# Patient Record
Sex: Male | Born: 1955
Health system: Southern US, Community
[De-identification: ages and names within clinical notes are randomized; demographics above are authoritative.]

## PROBLEM LIST (undated history)

## (undated) ENCOUNTER — Emergency Department (HOSPITAL_COMMUNITY): Payer: Self-pay | Source: Home / Self Care

## (undated) DIAGNOSIS — N200 Calculus of kidney: Secondary | ICD-10-CM

## (undated) DIAGNOSIS — E785 Hyperlipidemia, unspecified: Secondary | ICD-10-CM

## (undated) DIAGNOSIS — E119 Type 2 diabetes mellitus without complications: Secondary | ICD-10-CM

## (undated) DIAGNOSIS — I10 Essential (primary) hypertension: Secondary | ICD-10-CM

## (undated) DIAGNOSIS — I639 Cerebral infarction, unspecified: Secondary | ICD-10-CM

## (undated) DIAGNOSIS — Z87442 Personal history of urinary calculi: Secondary | ICD-10-CM

## (undated) DIAGNOSIS — K259 Gastric ulcer, unspecified as acute or chronic, without hemorrhage or perforation: Secondary | ICD-10-CM

## (undated) DIAGNOSIS — I6529 Occlusion and stenosis of unspecified carotid artery: Secondary | ICD-10-CM

## (undated) HISTORY — DX: Gastric ulcer, unspecified as acute or chronic, without hemorrhage or perforation: K25.9

## (undated) HISTORY — DX: Calculus of kidney: N20.0

## (undated) HISTORY — DX: Essential (primary) hypertension: I10

## (undated) HISTORY — DX: Type 2 diabetes mellitus without complications: E11.9

## (undated) HISTORY — DX: Occlusion and stenosis of unspecified carotid artery: I65.29

## (undated) HISTORY — PX: OTHER SURGICAL HISTORY: SHX169

## (undated) HISTORY — DX: Cerebral infarction, unspecified: I63.9

## (undated) HISTORY — DX: Hyperlipidemia, unspecified: E78.5

## (undated) HISTORY — PX: HERNIA REPAIR: SHX51

---

## 2015-03-10 ENCOUNTER — Ambulatory Visit (INDEPENDENT_AMBULATORY_CARE_PROVIDER_SITE_OTHER): Payer: No Typology Code available for payment source | Admitting: Endocrinology

## 2015-03-10 ENCOUNTER — Encounter: Payer: Self-pay | Admitting: Endocrinology

## 2015-03-10 VITALS — BP 142/84 | HR 89 | Temp 98.3°F | Ht 70.0 in | Wt 203.0 lb

## 2015-03-10 DIAGNOSIS — I10 Essential (primary) hypertension: Secondary | ICD-10-CM

## 2015-03-10 DIAGNOSIS — E785 Hyperlipidemia, unspecified: Secondary | ICD-10-CM | POA: Diagnosis not present

## 2015-03-10 DIAGNOSIS — E119 Type 2 diabetes mellitus without complications: Secondary | ICD-10-CM | POA: Diagnosis not present

## 2015-03-10 LAB — POCT GLYCOSYLATED HEMOGLOBIN (HGB A1C): HEMOGLOBIN A1C: 8.7

## 2015-03-10 MED ORDER — GLIMEPIRIDE 1 MG PO TABS: 1.0000 mg | ORAL_TABLET | Freq: Every day | ORAL | Status: DC

## 2015-03-10 MED ORDER — CANAGLIFLOZIN 300 MG PO TABS: 300.0000 mg | ORAL_TABLET | Freq: Every day | ORAL | Status: DC

## 2015-03-10 NOTE — Patient Instructions (Addendum)
good diet and exercise significantly improve the control of your diabetes.  please let me know if you wish to be referred to a dietician.  high blood sugar is very risky to your health.  you should see an eye doctor and dentist every year.  It is very important to get all recommended vaccinations.  controlling your blood pressure and cholesterol drastically reduces the damage diabetes does to your body.  Those who smoke should quit.  please discuss these with your doctor.  check your blood sugar once a day.  vary the time of day when you check, between before the 3 meals, and at bedtime.  also check if you have symptoms of your blood sugar being too high or too low.  please keep a record of the readings and bring it to your next appointment here (or you can bring the meter itself).  You can write it on any piece of paper.  please call us sooner if your blood sugar goes below 70, or if you have a lot of readings over 200. You have a high risk of developing type 1 diabetes, so we should continue to keep a close eye on it.   i have sent a prescription to your pharmacy, to add "invokana," and:  Please reduce the glimepiride to 1 mg each morning, and:  Please continue the same metformin.

## 2015-03-10 NOTE — Progress Notes (Signed)
Subjective:    Patient ID: Jon [name], male    DOB: 03-02-1956, 59 y.o.   MRN: [id number]  HPI pt states DM was dx'ed in 1998; he has mild if any neuropathy of the lower extremities; he is unaware of any associated chronic complications; he has never been on insulin, but he knows how to inject; pt says his diet and exercise are; he has never had pancreatitis, severe hypoglycemia or DKA.  Pt says his cbg's are highest after lunch.  He occasionally has hypoglycemia, and these episodes are mild.   No past medical history on file.  No past surgical history on file.  Social History   Social History  . Marital Status: Single    Spouse Name: N/A  . Number of Children: N/A  . Years of Education: N/A   Occupational History  . Not on file.   Social History Main Topics  . Smoking status: Never Smoker   . Smokeless tobacco: Not on file  . Alcohol Use: 0.0 oz/week    0 Standard drinks or equivalent per week  . Drug Use: Not on file  . Sexual Activity: Not on file   Other Topics Concern  . Not on file   Social History Narrative  . No narrative on file    No current outpatient prescriptions on file prior to visit.   No current facility-administered medications on file prior to visit.    No Known Allergies  Family History  Problem Relation Age of Onset  . Diabetes Father   . Diabetes Sister   . Diabetes Son   . Diabetes Daughter     BP 142/84 mmHg  Pulse 89  Temp(Src) 98.3 F (36.8 C) (Oral)  Ht 5\' 10"  (1.778 m)  Wt 203 lb (92.08 kg)  BMI 29.13 kg/m2  SpO2 96%   Review of Systems denies weight loss, blurry vision, headache, chest pain, sob, n/v, muscle cramps, excessive diaphoresis, depression, cold intolerance, rhinorrhea, and easy bruising.  He has urinary frequency.     Objective:   Physical Exam VS: see vs page GEN: no distress HEAD: head: no deformity eyes: no periorbital swelling, no proptosis external nose and ears are normal mouth: no lesion  seen NECK: supple, thyroid is not enlarged CHEST WALL: no deformity LUNGS: clear to auscultation BREASTS:  No gynecomastia CV: reg rate and rhythm, no murmur ABD: abdomen is soft, nontender.  no hepatosplenomegaly.  not distended.  no hernia.  MUSCULOSKELETAL: muscle bulk and strength are grossly normal.  no obvious joint swelling.  gait is normal and steady EXTEMITIES: no deformity.  no ulcer on the feet.  feet are of normal color and temp.  no edema PULSES: dorsalis pedis intact bilat.  no carotid bruit NEURO:  cn 2-12 grossly intact.   readily moves all 4's.  sensation is intact to touch on the feet SKIN:  Normal texture and temperature.  No rash or suspicious lesion is visible.   NODES:  None palpable at the neck PSYCH: alert, well-oriented.  Does not appear anxious nor depressed.  outside test results are reviewed: Pt was seen at office in [place], and amaryl was adjusted based on a1c.  Lab Results  Component Value Date   HGBA1C 8.7 03/10/2015      Assessment & Plan:  DM: he needs increased rx.  Given his strong FHx for type 1 DM, he is at risk for developing this over time.   Patient is advised the following: Patient Instructions  good diet and  exercise significantly improve the control of your diabetes.  please let me know if you wish to be referred to a dietician.  high blood sugar is very risky to your health.  you should see an eye doctor and dentist every year.  It is very important to get all recommended vaccinations.  controlling your blood pressure and cholesterol drastically reduces the damage diabetes does to your body.  Those who smoke should quit.  please discuss these with your doctor.  check your blood sugar once a day.  vary the time of day when you check, between before the 3 meals, and at bedtime.  also check if you have symptoms of your blood sugar being too high or too low.  please keep a record of the readings and bring it to your next appointment here (or you can  bring the meter itself).  You can write it on any piece of paper.  please call us sooner if your blood sugar goes below 70, or if you have a lot of readings over 200. You have a high risk of developing type 1 diabetes, so we should continue to keep a close eye on it.   i have sent a prescription to your pharmacy, to add "invokana," and:  Please reduce the glimepiride to 1 mg each morning, and:  Please continue the same metformin.

## 2015-03-12 DIAGNOSIS — I10 Essential (primary) hypertension: Secondary | ICD-10-CM | POA: Insufficient documentation

## 2015-03-12 DIAGNOSIS — E785 Hyperlipidemia, unspecified: Secondary | ICD-10-CM | POA: Insufficient documentation

## 2015-03-17 ENCOUNTER — Ambulatory Visit (INDEPENDENT_AMBULATORY_CARE_PROVIDER_SITE_OTHER): Payer: No Typology Code available for payment source | Admitting: Family

## 2015-03-17 ENCOUNTER — Encounter: Payer: Self-pay | Admitting: Family

## 2015-03-17 VITALS — BP 106/62 | HR 92 | Temp 98.3°F | Resp 18 | Ht 70.0 in | Wt 198.1 lb

## 2015-03-17 DIAGNOSIS — N529 Male erectile dysfunction, unspecified: Secondary | ICD-10-CM

## 2015-03-17 DIAGNOSIS — Z0000 Encounter for general adult medical examination without abnormal findings: Secondary | ICD-10-CM | POA: Diagnosis not present

## 2015-03-17 MED ORDER — TADALAFIL 10 MG PO TABS: 10.0000 mg | ORAL_TABLET | Freq: Every day | ORAL | Status: DC | PRN

## 2015-03-17 NOTE — Progress Notes (Signed)
Subjective:    Patient ID: Jon [name], male    DOB: 1956-02-10, 59 y.o.   MRN: [id number]  Chief Complaint  Patient presents with  . Establish Care    CPE, not fasting    HPI:  Jon [name] is a 59 y.o. male who presents today for an annual wellness visit.   1) Health Maintenance -   Diet - Low carb diet; Averages about 3 meals per day includes a variety of fruits and vegetables, and some meat; 3-4 cups of caffeine daily.   Exercise - Walking at work; no structured exercise   2) Preventative Exams / Immunizations:  Dental -- Up to date  Vision -- Up to date   Health Maintenance  Topic Date Due  . Hepatitis C Screening  01-16-1956  . URINE MICROALBUMIN  01/14/1964  . ZOSTAVAX  01/13/2014  . HIV Screening  01/16/2016 (Originally 01/13/1969)  . INFLUENZA VACCINE  01/27/2016 (Originally 12/13/2014)  . OPHTHALMOLOGY EXAM  07/13/2015  . HEMOGLOBIN A1C  09/08/2015  . FOOT EXAM  03/09/2016  . PNEUMOCOCCAL POLYSACCHARIDE VACCINE (2) 07/13/2019  . TETANUS/TDAP  06/20/2021  . COLONOSCOPY  08/12/2021    There is no immunization history on file for this patient.  3.) Erectile dysfunction - Associated symptom of erectile dysfunction has been going on for a couple of years. Indicates that he has difficulties obtaining and maintaining a erection. Modifying factors include Viagra which did not seem to work very well.   No Known Allergies   Outpatient Prescriptions Prior to Visit  Medication Sig Dispense Refill  . amLODipine (NORVASC) 10 MG tablet Take 10 mg by mouth daily.    Marland Kitchen atorvastatin (LIPITOR) 10 MG tablet Take 10 mg by mouth daily.    . canagliflozin (INVOKANA) 300 MG TABS tablet Take 300 mg by mouth daily before breakfast. 30 tablet 11  . glimepiride (AMARYL) 1 MG tablet Take 1 tablet (1 mg total) by mouth daily with breakfast. 30 tablet 11  . lisinopril (PRINIVIL,ZESTRIL) 10 MG tablet Take 10 mg by mouth daily.    . metformin (FORTAMET) 500 MG (OSM) 24 hr tablet  Take 500 mg by mouth 2 (two) times daily with a meal.     No facility-administered medications prior to visit.     Past Medical History  Diagnosis Date  . Diabetes mellitus without complication ([place])   . Hyperlipidemia   . Hypertension   . Stomach ulcer   . Kidney stones      Past Surgical History  Procedure Laterality Date  . Hernia repair    . Stomach ulcer       Family History  Problem Relation Age of Onset  . Diabetes Father   . Stroke Father   . Hypertension Father   . Diabetes Sister   . Diabetes Son   . Diabetes Daughter   . Hypertension Mother      Social History   Social History  . Marital Status: Single    Spouse Name: N/A  . Number of Children: 4  . Years of Education: 16   Occupational History  . Security    Social History Main Topics  . Smoking status: Never Smoker   . Smokeless tobacco: Never Used  . Alcohol Use: 0.6 - 1.2 oz/week    0 Standard drinks or equivalent, 1-2 Glasses of wine per week     Comment: socially  . Drug Use: No  . Sexual Activity: Not on file   Other Topics Concern  .  Not on file   Social History Narrative   Fun: Politics        Review of Systems  Constitutional: Denies fever, chills, fatigue, or significant weight gain/loss. HENT: Head: Denies headache or neck pain Ears: Denies changes in hearing, ringing in ears, earache, drainage Nose: Denies discharge, stuffiness, itching, nosebleed, sinus pain Throat: Denies sore throat, hoarseness, dry mouth, sores, thrush Eyes: Denies loss/changes in vision, pain, redness, blurry/double vision, flashing lights Cardiovascular: Denies chest pain/discomfort, tightness, palpitations, shortness of breath with activity, difficulty lying down, swelling, sudden awakening with shortness of breath Respiratory: Denies shortness of breath, cough, sputum production, wheezing Gastrointestinal: Denies dysphasia, heartburn, change in appetite, nausea, change in bowel habits, rectal  bleeding, constipation, diarrhea, yellow skin or eyes Genitourinary: Denies frequency, urgency, burning/pain, blood in urine, incontinence, change in urinary strength Positive for erectile dysfunction Musculoskeletal: Denies muscle/joint pain, stiffness, back pain, redness or swelling of joints, trauma Skin: Denies rashes, lumps, itching, dryness, color changes, or hair/nail changes Neurological: Denies dizziness, fainting, seizures, weakness, numbness, tingling, tremor Psychiatric - Denies nervousness, stress, depression or memory loss Endocrine: Denies heat or cold intolerance, sweating, frequent urination, excessive thirst, changes in appetite Hematologic: Denies ease of bruising or bleeding     Objective:     BP 106/62 mmHg  Pulse 92  Temp(Src) 98.3 F (36.8 C) (Oral)  Resp 18  Ht 5\' 10"  (1.778 m)  Wt 198 lb 1.9 oz (89.867 kg)  BMI 28.43 kg/m2  SpO2 98% Nursing note and vital signs reviewed.  Physical Exam  Constitutional: He is oriented to person, place, and time. He appears well-developed and well-nourished.  HENT:  Head: Normocephalic.  Right Ear: Hearing, tympanic membrane, external ear and ear canal normal.  Left Ear: Hearing, tympanic membrane, external ear and ear canal normal.  Nose: Nose normal.  Mouth/Throat: Uvula is midline, oropharynx is clear and moist and mucous membranes are normal.  Eyes: Conjunctivae and EOM are normal. Pupils are equal, round, and reactive to light.  Neck: Neck supple. No JVD present. No tracheal deviation present. No thyromegaly present.  Cardiovascular: Normal rate, regular rhythm, normal heart sounds and intact distal pulses.   Pulmonary/Chest: Effort normal and breath sounds normal.  Abdominal: Soft. Bowel sounds are normal. He exhibits no distension and no mass. There is no tenderness. There is no rebound and no guarding.  Musculoskeletal: Normal range of motion. He exhibits no edema or tenderness.  Lymphadenopathy:    He has no  cervical adenopathy.  Neurological: He is alert and oriented to person, place, and time. He has normal reflexes. No cranial nerve deficit. He exhibits normal muscle tone. Coordination normal.  Skin: Skin is warm and dry.  Psychiatric: He has a normal mood and affect. His behavior is normal. Judgment and thought content normal.       Assessment & Plan:   Problem List Items Addressed This Visit      Genitourinary   Erectile dysfunction    Symptoms consistent with erectile dysfunction most likely related to blood pressure medication and multiple co-morbidities. Discussed risks and benefits of medication. Start Cialis. Instructed to seek emergency care for priapism. Follow up office visit pending trial of medication.       Relevant Medications   tadalafil (CIALIS) 10 MG tablet     Other   Encounter for health maintenance examination in adult - Primary    1) Anticipatory Guidance: Discussed importance of wearing a seatbelt while driving and not texting while driving; changing batteries in smoke detector  at least once annually; wearing suntan lotion when outside; eating a balanced and moderate diet; getting physical activity at least 30 minutes per day.  2) Immunizations / Screenings / Labs:  Declines influenza and Zostavax. All other immunizations are up to date per recommendations. Declined Hepatitis C and HIV screening. Obtain PSA for prostate screening. All other screenings are up to date per recommendations. Obtain CBC, CMET, Lipid profile and TSH.  Overall well exam with risk factors for cardiovascular disease including hypertension, diabetes and overweight. Blood pressure and diabetes currently maintained with medication. Encouraged continued adequate nutrition through a moderate and varied diet emphasizing nutrient dense foods and decreased saturated and trans fat. Encouraged continued walking at work and adding physical activity outside of work. Goal weight loss of 5-10% of current  weight in the next 6 months. Continue previously prescribed medications. Follow up office visit pending lab work and follow up prevention exam in 1 year.       Relevant Orders   Comprehensive metabolic panel   CBC   Lipid panel   TSH   PSA

## 2015-03-17 NOTE — Assessment & Plan Note (Signed)
1) Anticipatory Guidance: Discussed importance of wearing a seatbelt while driving and not texting while driving; changing batteries in smoke detector at least once annually; wearing suntan lotion when outside; eating a balanced and moderate diet; getting physical activity at least 30 minutes per day.  2) Immunizations / Screenings / Labs:  Declines influenza and Zostavax. All other immunizations are up to date per recommendations. Declined Hepatitis C and HIV screening. Obtain PSA for prostate screening. All other screenings are up to date per recommendations. Obtain CBC, CMET, Lipid profile and TSH.  Overall well exam with risk factors for cardiovascular disease including hypertension, diabetes and overweight. Blood pressure and diabetes currently maintained with medication. Encouraged continued adequate nutrition through a moderate and varied diet emphasizing nutrient dense foods and decreased saturated and trans fat. Encouraged continued walking at work and adding physical activity outside of work. Goal weight loss of 5-10% of current weight in the next 6 months. Continue previously prescribed medications. Follow up office visit pending lab work and follow up prevention exam in 1 year.

## 2015-03-17 NOTE — Assessment & Plan Note (Signed)
Symptoms consistent with erectile dysfunction most likely related to blood pressure medication and multiple co-morbidities. Discussed risks and benefits of medication. Start Cialis. Instructed to seek emergency care for priapism. Follow up office visit pending trial of medication.

## 2015-03-17 NOTE — Patient Instructions (Signed)
Thank you for choosing [facility].  Summary/Instructions:  Your prescription(s) have been submitted to your pharmacy or been printed and provided for you. Please take as directed and contact our office if you believe you are having problem(s) with the medication(s) or have any questions.  Please stop by the lab on the basement level of the building for your blood work. Your results will be released to MyChart (or called to you) after review, usually within 72 hours after test completion. If any changes need to be made, you will be notified at that same time.  If your symptoms worsen or fail to improve, please contact our office for further instruction, or in case of emergency go directly to the emergency room at the closest medical facility.     

## 2015-03-17 NOTE — Progress Notes (Signed)
Pre visit review using our clinic review tool, if applicable. No additional management support is needed unless otherwise documented below in the visit note.  Refused flu and tetanus shot

## 2015-03-18 ENCOUNTER — Other Ambulatory Visit (INDEPENDENT_AMBULATORY_CARE_PROVIDER_SITE_OTHER): Payer: No Typology Code available for payment source

## 2015-03-18 DIAGNOSIS — Z0000 Encounter for general adult medical examination without abnormal findings: Secondary | ICD-10-CM | POA: Diagnosis not present

## 2015-03-18 LAB — LIPID PANEL
CHOLESTEROL: 167 mg/dL (ref 0–200)
HDL: 35.5 mg/dL — AB (ref >39.00)
LDL Cholesterol: 106 mg/dL — ABNORMAL HIGH (ref 0–99)
NONHDL: 131.14
TRIGLYCERIDES: 127 mg/dL (ref 0.0–149.0)
Total CHOL/HDL Ratio: 5
VLDL: 25.4 mg/dL (ref 0.0–40.0)

## 2015-03-18 LAB — COMPREHENSIVE METABOLIC PANEL
ALBUMIN: 4.2 g/dL (ref 3.5–5.2)
ALK PHOS: 63 U/L (ref 39–117)
ALT: 15 U/L (ref 0–53)
AST: 16 U/L (ref 0–37)
BILIRUBIN TOTAL: 0.5 mg/dL (ref 0.2–1.2)
BUN: 17 mg/dL (ref 6–23)
CO2: 27 mEq/L (ref 19–32)
CREATININE: 0.97 mg/dL (ref 0.40–1.50)
Calcium: 9.5 mg/dL (ref 8.4–10.5)
Chloride: 104 mEq/L (ref 96–112)
GFR: 83.58 mL/min (ref >60.00)
GLUCOSE: 139 mg/dL — AB (ref 70–99)
Potassium: 4.4 mEq/L (ref 3.5–5.1)
SODIUM: 140 meq/L (ref 135–145)
TOTAL PROTEIN: 7.6 g/dL (ref 6.0–8.3)

## 2015-03-18 LAB — TSH: TSH: 1.1 u[IU]/mL (ref 0.35–4.50)

## 2015-03-18 LAB — PSA: PSA: 1.28 ng/mL (ref 0.10–4.00)

## 2015-03-18 LAB — CBC
HCT: 41.6 % (ref 39.0–52.0)
Hemoglobin: 13.3 g/dL (ref 13.0–17.0)
MCHC: 31.9 g/dL (ref 30.0–36.0)
MCV: 78.6 fl (ref 78.0–100.0)
Platelets: 275 10*3/uL (ref 150.0–400.0)
RBC: 5.3 Mil/uL (ref 4.22–5.81)
RDW: 15.9 % — AB (ref 11.5–15.5)
WBC: 4.1 10*3/uL (ref 4.0–10.5)

## 2015-03-21 ENCOUNTER — Telehealth: Payer: Self-pay | Admitting: Family

## 2015-03-21 NOTE — Telephone Encounter (Signed)
Please inform patient that his blood work shows that his thyroid function, prostate, kidney function, liver function and white/red blood cells are within the normal limits. His fasting blood sugar is elevated at 139 consistent with an A1c of 8.7. Lastly his good cholesterol is low and therefore please continue to take atorvastatin (lipitor) while also working to decrease saturated fats and processed foods in his diet while increasing cardiovascular activity. Food sources to help include omega-3 fatty acids including olive oil, salmon, flaxseed, walnuts, and fish oil. Please plan to follow up for diabetes in January.

## 2015-03-24 NOTE — Telephone Encounter (Signed)
Pt is aware of results and his physical form has been faxed to his job.

## 2015-03-24 NOTE — Telephone Encounter (Signed)
Realized that the form needed his signature and information for him to fill out bc the form is incomplete. Pt states that he will be here tomorrow morning by 8.

## 2015-04-11 ENCOUNTER — Ambulatory Visit: Payer: No Typology Code available for payment source | Admitting: Endocrinology

## 2015-04-12 ENCOUNTER — Ambulatory Visit (INDEPENDENT_AMBULATORY_CARE_PROVIDER_SITE_OTHER): Payer: Self-pay | Admitting: Endocrinology

## 2015-04-12 ENCOUNTER — Telehealth: Payer: Self-pay | Admitting: *Deleted

## 2015-04-12 ENCOUNTER — Encounter: Payer: Self-pay | Admitting: Endocrinology

## 2015-04-12 VITALS — BP 146/80 | HR 85 | Temp 98.3°F | Ht 70.0 in | Wt 200.0 lb

## 2015-04-12 DIAGNOSIS — E119 Type 2 diabetes mellitus without complications: Secondary | ICD-10-CM

## 2015-04-12 MED ORDER — AMLODIPINE BESYLATE 10 MG PO TABS: 10.0000 mg | ORAL_TABLET | Freq: Every day | ORAL | Status: DC

## 2015-04-12 MED ORDER — CANAGLIFLOZIN 300 MG PO TABS: 300.0000 mg | ORAL_TABLET | Freq: Every day | ORAL | Status: DC

## 2015-04-12 MED ORDER — LISINOPRIL 10 MG PO TABS: 10.0000 mg | ORAL_TABLET | Freq: Every day | ORAL | Status: DC

## 2015-04-12 MED ORDER — LINAGLIPTIN 5 MG PO TABS: 5.0000 mg | ORAL_TABLET | Freq: Every day | ORAL | Status: DC

## 2015-04-12 MED ORDER — ATORVASTATIN CALCIUM 10 MG PO TABS: 10.0000 mg | ORAL_TABLET | Freq: Every day | ORAL | Status: DC

## 2015-04-12 NOTE — Patient Instructions (Addendum)
check your blood sugar once a day.  vary the time of day when you check, between before the 3 meals, and at bedtime.  also check if you have symptoms of your blood sugar being too high or too low.  please keep a record of the readings and bring it to your next appointment here (or you can bring the meter itself).  You can write it on any piece of paper.  please call us sooner if your blood sugar goes below 70, or if you have a lot of readings over 200. You have a high risk of developing type 1 diabetes (which needs insulin), so we should continue to keep a close eye on it.   i have sent a prescription to your pharmacy, to add "tradjenta," and:  Please stop taking the glimepiride.   Please continue the same metformin and invokana.   Please come back for a follow-up appointment in 2 months.

## 2015-04-12 NOTE — Progress Notes (Signed)
Subjective:    Patient ID: Jon [name], male    DOB: 1956/05/12, 59 y.o.   MRN: [id number]  HPI  Pt returns for f/u of diabetes mellitus: DM type: 2 Dx'ed: [id number] Complications: none Therapy: 2 oral meds GDM: never DKA: never Severe hypoglycemia: never Pancreatitis: never Other: he has never been on insulin, but he knows how to inject Interval history: no cbg record, but states cbg's are well-controlled.  pt states he feels well in general.   Past Medical History  Diagnosis Date  . Diabetes mellitus without complication ([place])   . Hyperlipidemia   . Hypertension   . Stomach ulcer   . Kidney stones     Past Surgical History  Procedure Laterality Date  . Hernia repair    . Stomach ulcer      Social History   Social History  . Marital Status: Single    Spouse Name: N/A  . Number of Children: 4  . Years of Education: 16   Occupational History  . Security    Social History Main Topics  . Smoking status: Never Smoker   . Smokeless tobacco: Never Used  . Alcohol Use: 0.6 - 1.2 oz/week    0 Standard drinks or equivalent, 1-2 Glasses of wine per week     Comment: socially  . Drug Use: No  . Sexual Activity: Not on file   Other Topics Concern  . Not on file   Social History Narrative   Fun: Politics       Current Outpatient Prescriptions on File Prior to Visit  Medication Sig Dispense Refill  . metformin (FORTAMET) 500 MG (OSM) 24 hr tablet Take 500 mg by mouth 2 (two) times daily with a meal.    . tadalafil (CIALIS) 10 MG tablet Take 1 tablet (10 mg total) by mouth daily as needed for erectile dysfunction. 10 tablet 0   No current facility-administered medications on file prior to visit.    No Known Allergies  Family History  Problem Relation Age of Onset  . Diabetes Father   . Stroke Father   . Hypertension Father   . Diabetes Sister   . Diabetes Son   . Diabetes Daughter   . Hypertension Mother     BP 146/80 mmHg  Pulse 85  Temp(Src) 98.3 F  (36.8 C) (Oral)  Ht 5\' 10"  (1.778 m)  Wt 200 lb (90.719 kg)  BMI 28.70 kg/m2  SpO2 94%  Review of Systems He denies hypoglycemia    Objective:   Physical Exam VITAL SIGNS:  See vs page GENERAL: no distress Ext: no edema.       Assessment & Plan:  DM: Needs increased rx, if it can be done with a regimen that avoids or minimizes hypoglycemia.    Patient is advised the following: Patient Instructions  check your blood sugar once a day.  vary the time of day when you check, between before the 3 meals, and at bedtime.  also check if you have symptoms of your blood sugar being too high or too low.  please keep a record of the readings and bring it to your next appointment here (or you can bring the meter itself).  You can write it on any piece of paper.  please call us sooner if your blood sugar goes below 70, or if you have a lot of readings over 200. You have a high risk of developing type 1 diabetes (which needs insulin), so we should continue to keep  a close eye on it.   i have sent a prescription to your pharmacy, to add "tradjenta," and:  Please stop taking the glimepiride.   Please continue the same metformin and invokana.   Please come back for a follow-up appointment in 2 months.

## 2015-04-12 NOTE — Telephone Encounter (Signed)
Left msg on triage stating needing refills. Called pt back no answer LMOM sent maintenance meds that are monitored by Marya Amsler to pharmacy...Johny Chess

## 2015-04-13 ENCOUNTER — Telehealth: Payer: Self-pay | Admitting: Family

## 2015-04-13 ENCOUNTER — Telehealth: Payer: Self-pay | Admitting: Endocrinology

## 2015-04-13 MED ORDER — SITAGLIPTIN PHOSPHATE 100 MG PO TABS: 100.0000 mg | ORAL_TABLET | Freq: Every day | ORAL | Status: DC

## 2015-04-13 MED ORDER — ATORVASTATIN CALCIUM 40 MG PO TABS: 40.0000 mg | ORAL_TABLET | Freq: Every day | ORAL | Status: DC

## 2015-04-13 MED ORDER — ATORVASTATIN CALCIUM 10 MG PO TABS: 10.0000 mg | ORAL_TABLET | Freq: Every day | ORAL | Status: DC

## 2015-04-13 MED ORDER — LISINOPRIL 40 MG PO TABS: 40.0000 mg | ORAL_TABLET | Freq: Every day | ORAL | Status: DC

## 2015-04-13 NOTE — Telephone Encounter (Signed)
please call patient: Ins wants you to take januva instead of tradjenta. i have sent a prescription to your pharmacy.   i'll see you next time.

## 2015-04-13 NOTE — Telephone Encounter (Signed)
Rx resent for 40 mg. Pt aware.

## 2015-04-13 NOTE — Telephone Encounter (Signed)
States the dosage on his lisinopril is not correct.  States pharmacy has script for 10mg  but the correct mg should be 40mg .

## 2015-04-13 NOTE — Telephone Encounter (Signed)
Left voicemail advising of note below. Requested a call back if the patient would like to discuss.

## 2015-05-31 ENCOUNTER — Ambulatory Visit: Payer: No Typology Code available for payment source | Admitting: Endocrinology

## 2015-06-14 ENCOUNTER — Ambulatory Visit: Payer: No Typology Code available for payment source | Admitting: Endocrinology

## 2015-06-28 ENCOUNTER — Ambulatory Visit: Payer: No Typology Code available for payment source | Admitting: Endocrinology

## 2015-06-29 ENCOUNTER — Telehealth: Payer: Self-pay | Admitting: Endocrinology

## 2015-06-29 NOTE — Telephone Encounter (Signed)
Please come back for a follow-up appointment in 2 weeks 

## 2015-06-29 NOTE — Telephone Encounter (Signed)
Patient no showed today's appt. Please advise on how to follow up. °A. No follow up necessary. °B. Follow up urgent. Contact patient immediately. °C. Follow up necessary. Contact patient and schedule visit in ___ days. °D. Follow up advised. Contact patient and schedule visit in ____weeks. ° °

## 2015-06-30 NOTE — Telephone Encounter (Signed)
Jon [name], Could you contact the pt and reschedule.  Thanks!

## 2015-08-01 ENCOUNTER — Ambulatory Visit (INDEPENDENT_AMBULATORY_CARE_PROVIDER_SITE_OTHER): Payer: No Typology Code available for payment source | Admitting: Endocrinology

## 2015-08-01 ENCOUNTER — Encounter: Payer: Self-pay | Admitting: Endocrinology

## 2015-08-01 VITALS — BP 142/94 | HR 79 | Temp 98.1°F | Ht 70.0 in | Wt 198.0 lb

## 2015-08-01 DIAGNOSIS — E119 Type 2 diabetes mellitus without complications: Secondary | ICD-10-CM | POA: Diagnosis not present

## 2015-08-01 LAB — POCT GLYCOSYLATED HEMOGLOBIN (HGB A1C): Hemoglobin A1C: 7.8

## 2015-08-01 MED ORDER — ONETOUCH VERIO W/DEVICE KIT: 1.0000 | PACK | Freq: Once | Status: DC

## 2015-08-01 MED ORDER — GLUCOSE BLOOD VI STRP: 1.0000 | ORAL_STRIP | Freq: Every day | Status: DC

## 2015-08-01 MED ORDER — METFORMIN HCL ER (OSM) 500 MG PO TB24: 1000.0000 mg | ORAL_TABLET | Freq: Two times a day (BID) | ORAL | Status: DC

## 2015-08-01 NOTE — Patient Instructions (Addendum)
Please continue the same medications Please come back for a follow-up appointment in 3-4 months i have sent a prescription to your pharmacy, for a new meter and strips.  check your blood sugar once a day.  vary the time of day when you check, between before the 3 meals, and at bedtime.  also check if you have symptoms of your blood sugar being too high or too low.  please keep a record of the readings and bring it to your next appointment here (or you can bring the meter itself).  You can write it on any piece of paper.  please call us sooner if your blood sugar goes below 70, or if you have a lot of readings over 200.

## 2015-08-01 NOTE — Progress Notes (Signed)
Subjective:    Patient ID: Jon [name], male    DOB: 1955-05-26, 60 y.o.   MRN: [id number]  HPI Pt returns for f/u of diabetes mellitus: DM type: 2 (but he is presumed to be evolving type 1, due to strong FHx of type 1, need for multiple orals, labile cbg's, and lean body habitus).  Dx'ed: [id number] Complications: none Therapy: 3 oral meds GDM: never DKA: never Severe hypoglycemia: never Pancreatitis: never Other: he has never been on insulin, but he knows how to inject. Interval history: no cbg record, but states cbg's vary from 64-242.  pt states he feels well in general.  Past Medical History  Diagnosis Date  . Diabetes mellitus without complication ([place])   . Hyperlipidemia   . Hypertension   . Stomach ulcer   . Kidney stones     Past Surgical History  Procedure Laterality Date  . Hernia repair    . Stomach ulcer      Social History   Social History  . Marital Status: Single    Spouse Name: N/A  . Number of Children: 4  . Years of Education: 16   Occupational History  . Security    Social History Main Topics  . Smoking status: Never Smoker   . Smokeless tobacco: Never Used  . Alcohol Use: 0.6 - 1.2 oz/week    0 Standard drinks or equivalent, 1-2 Glasses of wine per week     Comment: socially  . Drug Use: No  . Sexual Activity: Not on file   Other Topics Concern  . Not on file   Social History Narrative   Fun: Politics       Current Outpatient Prescriptions on File Prior to Visit  Medication Sig Dispense Refill  . amLODipine (NORVASC) 10 MG tablet Take 1 tablet (10 mg total) by mouth daily. 90 tablet 3  . atorvastatin (LIPITOR) 10 MG tablet Take 1 tablet (10 mg total) by mouth daily. 90 tablet 0  . canagliflozin (INVOKANA) 300 MG TABS tablet Take 300 mg by mouth daily before breakfast. 30 tablet 11  . lisinopril (PRINIVIL,ZESTRIL) 40 MG tablet Take 1 tablet (40 mg total) by mouth daily. 90 tablet 3  . sitaGLIPtin (JANUVIA) 100 MG tablet Take 1 tablet  (100 mg total) by mouth daily. 30 tablet 11  . tadalafil (CIALIS) 10 MG tablet Take 1 tablet (10 mg total) by mouth daily as needed for erectile dysfunction. (Patient not taking: Reported on 08/01/2015) 10 tablet 0   No current facility-administered medications on file prior to visit.    No Known Allergies  Family History  Problem Relation Age of Onset  . Diabetes Father   . Stroke Father   . Hypertension Father   . Diabetes Sister   . Diabetes Son   . Diabetes Daughter   . Hypertension Mother     BP 142/94 mmHg  Pulse 79  Temp(Src) 98.1 F (36.7 C) (Oral)  Ht 5\' 10"  (1.778 m)  Wt 198 lb (89.812 kg)  BMI 28.41 kg/m2  SpO2 98%  Review of Systems Denies LOC    Objective:   Physical Exam VITAL SIGNS:  See vs page GENERAL: no distress Pulses: dorsalis pedis intact bilat.   MSK: no deformity of the feet CV: no leg edema Skin:  no ulcer on the feet.  normal color and temp on the feet. Neuro: sensation is intact to touch on the feet.    Lab Results  Component Value Date   HGBA1C 7.8  08/01/2015      Assessment & Plan:  DM: he needs increased rx, if it can be done with a regimen that avoids or minimizes hypoglycemia.  he declines the addition of another oral med.    Patient is advised the following: Patient Instructions  Please continue the same medications Please come back for a follow-up appointment in 3-4 months i have sent a prescription to your pharmacy, for a new meter and strips.  check your blood sugar once a day.  vary the time of day when you check, between before the 3 meals, and at bedtime.  also check if you have symptoms of your blood sugar being too high or too low.  please keep a record of the readings and bring it to your next appointment here (or you can bring the meter itself).  You can write it on any piece of paper.  please call us sooner if your blood sugar goes below 70, or if you have a lot of readings over 200.

## 2015-08-02 ENCOUNTER — Telehealth: Payer: Self-pay

## 2015-08-02 ENCOUNTER — Other Ambulatory Visit: Payer: Self-pay

## 2015-08-02 MED ORDER — ONETOUCH ULTRASOFT LANCETS MISC: Status: DC

## 2015-08-02 MED ORDER — ONETOUCH DELICA LANCETS 33G MISC: Status: DC

## 2015-08-02 NOTE — Telephone Encounter (Signed)
Any ext-release is OK

## 2015-08-02 NOTE — Telephone Encounter (Signed)
Pharmacy called about the pt's metformin the [facility] brad is very expensive and wanted to know if we could changed the ER. Please advise, Thanks!

## 2015-08-03 MED ORDER — METFORMIN HCL ER 500 MG PO TB24: ORAL_TABLET | ORAL | Status: DC

## 2015-08-03 NOTE — Telephone Encounter (Signed)
Rx submitted

## 2015-11-16 ENCOUNTER — Ambulatory Visit: Payer: No Typology Code available for payment source | Admitting: Endocrinology

## 2015-12-26 ENCOUNTER — Encounter: Payer: Self-pay | Admitting: Endocrinology

## 2015-12-26 ENCOUNTER — Ambulatory Visit (INDEPENDENT_AMBULATORY_CARE_PROVIDER_SITE_OTHER): Payer: PRIVATE HEALTH INSURANCE | Admitting: Endocrinology

## 2015-12-26 VITALS — BP 134/79 | HR 84 | Temp 98.4°F | Ht 70.0 in | Wt 205.8 lb

## 2015-12-26 DIAGNOSIS — E1310 Other specified diabetes mellitus with ketoacidosis without coma: Secondary | ICD-10-CM | POA: Diagnosis not present

## 2015-12-26 LAB — POCT GLYCOSYLATED HEMOGLOBIN (HGB A1C): Hemoglobin A1C: 9.5

## 2015-12-26 MED ORDER — CANAGLIFLOZIN 300 MG PO TABS: 300.0000 mg | ORAL_TABLET | Freq: Every day | ORAL | 11 refills | Status: DC

## 2015-12-26 MED ORDER — SITAGLIPTIN PHOSPHATE 100 MG PO TABS: 100.0000 mg | ORAL_TABLET | Freq: Every day | ORAL | 11 refills | Status: DC

## 2015-12-26 NOTE — Progress Notes (Signed)
Pre visit review using our clinic tool,if applicable. No additional management support is needed unless otherwise documented below in the visit note.  

## 2015-12-26 NOTE — Patient Instructions (Addendum)
I have sent prescriptions to your pharmacy, to resume the 2 other diabetes meds.  In each case, there are similar options.  Therefore, if they are expensive, just call, and I wold be happy to change. check your blood sugar once a day.  vary the time of day when you check, between before the 3 meals, and at bedtime.  also check if you have symptoms of your blood sugar being too high or too low.  please keep a record of the readings and bring it to your next appointment here (or you can bring the meter itself).  You can write it on any piece of paper.  please call us sooner if your blood sugar goes below 70, or if you have a lot of readings over 200. Please come back for a follow-up appointment in 2 months.

## 2015-12-26 NOTE — Progress Notes (Signed)
Subjective:    Patient ID: Jon [name], male    DOB: 01/02/1956, 60 y.o.   MRN: [id number]  HPI Pt returns for f/u of diabetes mellitus: DM type: 2 (but he is presumed to be evolving type 1, due to strong FHx of type 1, need for multiple orals, labile cbg's, and lean body habitus).  Dx'ed: 5809 Complications: none Therapy: 3 oral meds GDM: never DKA: never Severe hypoglycemia: never Pancreatitis: never Other: he has never been on insulin, but he knows how to inject; he has declined additional medication.   Interval history: pt states he feels well in general.  Due to new job, he takes only metformin.  He denies polyuria.   Past Medical History:  Diagnosis Date  . Diabetes mellitus without complication ([place])   . Hyperlipidemia   . Hypertension   . Kidney stones   . Stomach ulcer     Past Surgical History:  Procedure Laterality Date  . HERNIA REPAIR    . Stomach ulcer      Social History   Social History  . Marital status: Single    Spouse name: N/A  . Number of children: 4  . Years of education: 34   Occupational History  . Security    Social History Main Topics  . Smoking status: Never Smoker  . Smokeless tobacco: Never Used  . Alcohol use 0.6 - 1.2 oz/week    1 - 2 Glasses of wine per week     Comment: socially  . Drug use: No  . Sexual activity: Not on file   Other Topics Concern  . Not on file   Social History Narrative   Fun: Politics       Current Outpatient Prescriptions on File Prior to Visit  Medication Sig Dispense Refill  . amLODipine (NORVASC) 10 MG tablet Take 1 tablet (10 mg total) by mouth daily. 90 tablet 3  . atorvastatin (LIPITOR) 10 MG tablet Take 1 tablet (10 mg total) by mouth daily. 90 tablet 0  . Blood Glucose Monitoring Suppl (ONETOUCH VERIO) w/Device KIT 1 Device by Does not apply route once. 1 kit 0  . glucose blood (ONETOUCH VERIO) test strip 1 each by Other route daily. And lancets 1/day 100 each 12  . lisinopril  (PRINIVIL,ZESTRIL) 40 MG tablet Take 1 tablet (40 mg total) by mouth daily. 90 tablet 3  . metFORMIN (GLUCOPHAGE XR) 500 MG 24 hr tablet Take 2 tablets two times daily with a meal. 120 tablet 3  . ONETOUCH DELICA LANCETS 98P MISC Use to check blood sugar 1 time per day. DX Code: E11.9 100 each 2  . tadalafil (CIALIS) 10 MG tablet Take 1 tablet (10 mg total) by mouth daily as needed for erectile dysfunction. (Patient not taking: Reported on 12/26/2015) 10 tablet 0   No current facility-administered medications on file prior to visit.     No Known Allergies  Family History  Problem Relation Age of Onset  . Diabetes Father   . Stroke Father   . Hypertension Father   . Diabetes Sister   . Diabetes Son   . Diabetes Daughter   . Hypertension Mother     BP 134/79   Pulse 84   Temp 98.4 F (36.9 C) (Oral)   Ht '5\' 10"'$  (1.778 m)   Wt 205 lb 12.8 oz (93.4 kg)   SpO2 96%   BMI 29.53 kg/m   Review of Systems He has gained weight.      Objective:  Physical Exam  VITAL SIGNS:  See vs page GENERAL: no distress Pulses: dorsalis pedis intact bilat.   MSK: no deformity of the feet.   CV: no leg edema Skin:  no ulcer on the feet.  normal color and temp on the feet. Neuro: sensation is intact to touch on the feet.    A1c=9.5%    Assessment & Plan:  Type 2 DM: worse.  Resume meds.   Noncompliance with cbg recording and meds: we discussed risks.

## 2016-02-24 ENCOUNTER — Ambulatory Visit (INDEPENDENT_AMBULATORY_CARE_PROVIDER_SITE_OTHER): Payer: PRIVATE HEALTH INSURANCE | Admitting: Endocrinology

## 2016-02-24 VITALS — BP 126/82 | HR 92 | Ht 70.5 in | Wt 200.0 lb

## 2016-02-24 DIAGNOSIS — M6530 Trigger finger, unspecified finger: Secondary | ICD-10-CM

## 2016-02-24 DIAGNOSIS — E119 Type 2 diabetes mellitus without complications: Secondary | ICD-10-CM

## 2016-02-24 LAB — POCT GLYCOSYLATED HEMOGLOBIN (HGB A1C): Hemoglobin A1C: 10

## 2016-02-24 MED ORDER — PIOGLITAZONE HCL 45 MG PO TABS: 45.0000 mg | ORAL_TABLET | Freq: Every day | ORAL | 11 refills | Status: DC

## 2016-02-24 MED ORDER — GLIMEPIRIDE 4 MG PO TABS: 4.0000 mg | ORAL_TABLET | Freq: Every day | ORAL | 11 refills | Status: DC

## 2016-02-24 NOTE — Patient Instructions (Addendum)
Please continue the same metformin and: I have sent prescriptions to your pharmacy, to add glimepiride and pioglitizone.   check your blood sugar once a day.  vary the time of day when you check, between before the 3 meals, and at bedtime.  also check if you have symptoms of your blood sugar being too high or too low.  please keep a record of the readings and bring it to your next appointment here (or you can bring the meter itself).  You can write it on any piece of paper.  please call us sooner if your blood sugar goes below 70, or if you have a lot of readings over 200.  We can go to the insulin if necessary.   Please see a sports medicine medicine specialist.  you will receive a phone call, about a day and time for an appointment.  Please come back for a follow-up appointment in 1 month.

## 2016-02-24 NOTE — Progress Notes (Signed)
Subjective:    Patient ID: Jon [name], male    DOB: 10-01-1955, 60 y.o.   MRN: [id number]  HPI Pt returns for f/u of diabetes mellitus: DM type: 2 (but he is presumed to be evolving type 1, due to strong FHx of type 1, need for multiple orals, labile cbg's, and lean body habitus).  Dx'ed: 3382 Complications: none Therapy: 3 oral meds GDM: never DKA: never Severe hypoglycemia: never Pancreatitis: never Other: he has never been on insulin, but he knows how to inject; he has declined additional medication.   Interval history: He says he can afford neither the [place] nor the invokana, even with copay cards.  He takes metformin only.  no cbg record, but states cbg's are in the 300's.   Pt states 2 weeks of moderate right ring finger "getting hung up."  No assoc swelling Past Medical History:  Diagnosis Date  . Diabetes mellitus without complication ([place])   . Hyperlipidemia   . Hypertension   . Kidney stones   . Stomach ulcer     Past Surgical History:  Procedure Laterality Date  . HERNIA REPAIR    . Stomach ulcer      Social History   Social History  . Marital status: Single    Spouse name: N/A  . Number of children: 4  . Years of education: 63   Occupational History  . Security    Social History Main Topics  . Smoking status: Never Smoker  . Smokeless tobacco: Never Used  . Alcohol use 0.6 - 1.2 oz/week    1 - 2 Glasses of wine per week     Comment: socially  . Drug use: No  . Sexual activity: Not on file   Other Topics Concern  . Not on file   Social History Narrative   Fun: Politics       Current Outpatient Prescriptions on File Prior to Visit  Medication Sig Dispense Refill  . amLODipine (NORVASC) 10 MG tablet Take 1 tablet (10 mg total) by mouth daily. 90 tablet 3  . atorvastatin (LIPITOR) 10 MG tablet Take 1 tablet (10 mg total) by mouth daily. 90 tablet 0  . Blood Glucose Monitoring Suppl (ONETOUCH VERIO) w/Device KIT 1 Device by Does not apply  route once. 1 kit 0  . glucose blood (ONETOUCH VERIO) test strip 1 each by Other route daily. And lancets 1/day 100 each 12  . lisinopril (PRINIVIL,ZESTRIL) 40 MG tablet Take 1 tablet (40 mg total) by mouth daily. 90 tablet 3  . metFORMIN (GLUCOPHAGE XR) 500 MG 24 hr tablet Take 2 tablets two times daily with a meal. 120 tablet 3  . ONETOUCH DELICA LANCETS 50N MISC Use to check blood sugar 1 time per day. DX Code: E11.9 100 each 2   No current facility-administered medications on file prior to visit.     No Known Allergies  Family History  Problem Relation Age of Onset  . Diabetes Father   . Stroke Father   . Hypertension Father   . Diabetes Sister   . Diabetes Son   . Diabetes Daughter   . Hypertension Mother     BP 126/82   Pulse 92   Ht 5' 10.5" (1.791 m)   Wt 200 lb (90.7 kg)   SpO2 99%   BMI 28.29 kg/m   Review of Systems He denies weight change.  Denies finger pain.      Objective:   Physical Exam VITAL SIGNS:  See vs  page GENERAL: no distress Pulses: dorsalis pedis intact bilat.   MSK: no deformity of the feet CV: no leg edema Skin:  no ulcer on the feet.  normal color and temp on the feet. Neuro: sensation is intact to touch on the feet.   Right ring finger: 'trigger finger."   A1c=10.0%    Assessment & Plan:  Type 2 DM: I advised insulin, but he declines.   Trigger finger, new Patient is advised the following: Patient Instructions  Please continue the same metformin and: I have sent prescriptions to your pharmacy, to add glimepiride and pioglitizone.   check your blood sugar once a day.  vary the time of day when you check, between before the 3 meals, and at bedtime.  also check if you have symptoms of your blood sugar being too high or too low.  please keep a record of the readings and bring it to your next appointment here (or you can bring the meter itself).  You can write it on any piece of paper.  please call us sooner if your blood sugar goes below  70, or if you have a lot of readings over 200.  We can go to the insulin if necessary.   Please see a sports medicine medicine specialist.  you will receive a phone call, about a day and time for an appointment.  Please come back for a follow-up appointment in 1 month.

## 2016-03-05 ENCOUNTER — Ambulatory Visit (INDEPENDENT_AMBULATORY_CARE_PROVIDER_SITE_OTHER): Payer: PRIVATE HEALTH INSURANCE | Admitting: Sports Medicine

## 2016-03-05 ENCOUNTER — Encounter: Payer: Self-pay | Admitting: Sports Medicine

## 2016-03-05 VITALS — BP 136/77 | Ht 70.0 in | Wt 199.0 lb

## 2016-03-05 DIAGNOSIS — M6530 Trigger finger, unspecified finger: Secondary | ICD-10-CM

## 2016-03-05 MED ORDER — METHYLPREDNISOLONE ACETATE 40 MG/ML IJ SUSP
20.0000 mg | Freq: Once | INTRAMUSCULAR | Status: AC
  Administered 2016-03-05: 20 mg via INTRA_ARTICULAR

## 2016-03-05 NOTE — Progress Notes (Signed)
   Subjective:    Patient ID: Jon [name], male    DOB: 09/11/1955, 60 y.o.   MRN: [id number]  HPI chief complaint: Finger pain and "sticking"  60 year old right-hand-dominant male comes in today complaining of 3 weeks of right ring finger getting stuck. It is intermittent but occurs frequently every day. No trauma. No prior occurrences. Minimal pain. He has not tried any treatment to date.  Past medical history reviewed. It is significant for diabetes Medications reviewed    Review of Systems As above    Objective:   Physical Exam  Well-developed, well-nourished. No acute distress  Examination of the right hand with attention to the right fourth finger shows active triggering with flexion. No tenderness to palpation. No swelling. There is no palpable nodule at the A1 pulley. He is neurovascular intact distally.      Assessment & Plan:   Trigger finger, right fourth digit  I discussed the patient's treatment options including oral anti-inflammatories versus a cortisone injection. Patient elected to proceed with the cortisone injection after risks and benefits were explained. He tolerates the injection without difficulty. I want him to apply a Band-Aid splint to the PIP joint of this finger while sleeping for the next 2 weeks. If symptoms persist despite today's injection then we could consider referral to one of the hand specialists for possible A1 pulley release. Follow-up for ongoing or recalcitrant issues.  Consent obtained and verified. Time-out conducted. Noted no overlying erythema, induration, or other signs of local infection. Skin prepped in a sterile fashion. Topical analgesic spray: Ethyl chloride. Joint: right finger flexor tendon Needle: 25g 5/8 inch needle Completed without difficulty. Meds: 0.5cc 1% xylocaine, 0.5cc (20mg ) depomedrol  Advised to call if fevers/chills, erythema, induration, drainage, or persistent bleeding.

## 2016-04-13 ENCOUNTER — Other Ambulatory Visit: Payer: Self-pay | Admitting: Endocrinology

## 2016-04-13 ENCOUNTER — Other Ambulatory Visit: Payer: Self-pay | Admitting: Family

## 2016-04-18 ENCOUNTER — Other Ambulatory Visit: Payer: Self-pay | Admitting: Family

## 2016-04-27 ENCOUNTER — Ambulatory Visit: Payer: PRIVATE HEALTH INSURANCE | Admitting: Endocrinology

## 2016-05-23 ENCOUNTER — Telehealth: Payer: Self-pay | Admitting: Family

## 2016-05-23 MED ORDER — LISINOPRIL 40 MG PO TABS: 40.0000 mg | ORAL_TABLET | Freq: Every day | ORAL | 0 refills | Status: DC

## 2016-05-23 NOTE — Telephone Encounter (Signed)
Rx sent to pharmacy   

## 2016-05-23 NOTE — Telephone Encounter (Signed)
Patient has scheduled appt for next week for CPE.  Patient is currently out of lisinopril.  Patient is requesting enough to be sent to [facility] on [address] to get him through until this appt.

## 2016-05-31 ENCOUNTER — Encounter: Payer: PRIVATE HEALTH INSURANCE | Admitting: Family

## 2016-06-04 ENCOUNTER — Encounter: Payer: Self-pay | Admitting: Family

## 2016-06-04 ENCOUNTER — Ambulatory Visit (INDEPENDENT_AMBULATORY_CARE_PROVIDER_SITE_OTHER): Payer: Self-pay | Admitting: Family

## 2016-06-04 VITALS — BP 144/88 | HR 92 | Temp 98.8°F | Resp 14 | Ht 70.0 in | Wt 211.0 lb

## 2016-06-04 DIAGNOSIS — E785 Hyperlipidemia, unspecified: Secondary | ICD-10-CM

## 2016-06-04 DIAGNOSIS — I10 Essential (primary) hypertension: Secondary | ICD-10-CM

## 2016-06-04 DIAGNOSIS — Z0000 Encounter for general adult medical examination without abnormal findings: Secondary | ICD-10-CM | POA: Insufficient documentation

## 2016-06-04 DIAGNOSIS — E119 Type 2 diabetes mellitus without complications: Secondary | ICD-10-CM

## 2016-06-04 NOTE — Assessment & Plan Note (Signed)
The pressure slightly elevated today above goal 140/90 with current medication regimen. Indicates home blood pressures are slightly lower. Denies worst headache of life with no symptoms of end organ damage noted on physical exam. Continue current dosage of lisinopril and amlodipine. Obtaining medication remains a challenge secondary to recent job loss. Encouraged to monitor blood pressure at home and follow low-sodium diet. Continue to monitor.

## 2016-06-04 NOTE — Assessment & Plan Note (Signed)
1) Anticipatory Guidance: Discussed importance of wearing a seatbelt while driving and not texting while driving; changing batteries in smoke detector at least once annually; wearing suntan lotion when outside; eating a balanced and moderate diet; getting physical activity at least 30 minutes per day.  2) Immunizations / Screenings / Labs:  Declines Zostavax. All other immunizations are up to date per recommendations. Declines screening for Hepatitis C. Obtain PSA for prostate cancer screening. Due for dental and vision exam encouraged to be completed independently. Obtain A1c and urine microalbumin for diabetes screenings. Foot exam up to date. All other screenings are up to date per recommendations. Obtain CBC, CMET, and lipid profile.    Overall well exam with risk factors for cardiovascular disease including diabetes, hypertension and dyslipidemia. Chronic conditions appear to be poorly controlled with the current regimens. This is complicated by a recent loss of a job. He is also contemplating going back to his home country. Recommend increasing physical activity and taking medications as prescribed. Continue other healthy behaviors and choices. Follow up prevention exam in 1 year. Follow up office visit for chronic conditions pending blood work.

## 2016-06-04 NOTE — Assessment & Plan Note (Signed)
Previous A1c of 10.0 indicating poorly controlled type 2 diabetes. Currently managed through endocrinology. Discussed importance of taking medication as prescribed and monitoring blood sugars at home. Continue current dosage of metformin and glimepiride. Discussed possible resources to be used to obtain medication well unemployed. Denies numbness and tingling or symptoms of end organ damage. Pneumovax is up-to-date. Obtain urine microalbumin and A1c. Follow-up and changes of medication per endocrinology. Continue to monitor.

## 2016-06-04 NOTE — Progress Notes (Signed)
Subjective:    Patient ID: Jon [name], male    DOB: Jan 05, 1956, 61 y.o.   MRN: [id number]  Chief Complaint  Patient presents with  . CPE    not fasting    HPI:  Jon [name] is a 61 y.o. male who presents today for an annual wellness visit.   1) Health Maintenance -   Diet - Averages about 2-3 meals per day consisting of a lower carbohydrate/regular diet. Caffeine intake of about 1-2 cups per day  Exercise - Walks 3 miles daily with decreased exercise within the last 3 weeks secondary to weather.    2) Preventative Exams / Immunizations:  Dental -- Due for exam  Vision -- Due for exam    Health Maintenance  Topic Date Due  . Hepatitis C Screening  [birth year]  . HIV Screening  01/13/1969  . ZOSTAVAX  01/13/2014  . OPHTHALMOLOGY EXAM  07/13/2015  . INFLUENZA VACCINE  08/11/2016 (Originally 12/13/2015)  . HEMOGLOBIN A1C  08/24/2016  . FOOT EXAM  02/23/2017  . PNEUMOCOCCAL POLYSACCHARIDE VACCINE (2) 07/13/2019  . TETANUS/TDAP  06/20/2021  . COLONOSCOPY  08/12/2021     There is no immunization history on file for this patient.   No Known Allergies   Outpatient Medications Prior to Visit  Medication Sig Dispense Refill  . amLODipine (NORVASC) 10 MG tablet Take 1 tablet (10 mg total) by mouth daily. 90 tablet 3  . atorvastatin (LIPITOR) 10 MG tablet Take 1 tablet (10 mg total) by mouth daily. 90 tablet 0  . Blood Glucose Monitoring Suppl (ONETOUCH VERIO) w/Device KIT 1 Device by Does not apply route once. 1 kit 0  . glimepiride (AMARYL) 4 MG tablet Take 1 tablet (4 mg total) by mouth daily with breakfast. 30 tablet 11  . glucose blood (ONETOUCH VERIO) test strip 1 each by Other route daily. And lancets 1/day 100 each 12  . lisinopril (PRINIVIL,ZESTRIL) 40 MG tablet Take 1 tablet (40 mg total) by mouth daily. 30 tablet 0  . metFORMIN (GLUCOPHAGE-XR) 500 MG 24 hr tablet TAKE TWO TABLETS BY MOUTH TWICE DAILY WITH A MEAL 120 tablet 3  . ONETOUCH DELICA LANCETS 00Q  MISC Use to check blood sugar 1 time per day. DX Code: E11.9 100 each 2  . pioglitazone (ACTOS) 45 MG tablet Take 1 tablet (45 mg total) by mouth daily. 30 tablet 11   No facility-administered medications prior to visit.      Past Medical History:  Diagnosis Date  . Diabetes mellitus without complication ([place])   . Hyperlipidemia   . Hypertension   . Kidney stones   . Stomach ulcer      Past Surgical History:  Procedure Laterality Date  . HERNIA REPAIR    . Stomach ulcer       Family History  Problem Relation Age of Onset  . Diabetes Father   . Stroke Father   . Hypertension Father   . Hypertension Mother   . Diabetes Sister   . Diabetes Son   . Diabetes Daughter      Social History   Social History  . Marital status: Single    Spouse name: N/A  . Number of children: 4  . Years of education: 18   Occupational History  . Unemployed    Social History Main Topics  . Smoking status: Never Smoker  . Smokeless tobacco: Never Used  . Alcohol use 0.6 - 1.2 oz/week    1 - 2 Glasses of wine per  week     Comment: socially  . Drug use: No  . Sexual activity: Not on file   Other Topics Concern  . Not on file   Social History Narrative   Fun: Politics       Review of Systems  Constitutional: Denies fever, chills, fatigue, or significant weight gain/loss. HENT: Head: Denies headache or neck pain Ears: Denies changes in hearing, ringing in ears, earache, drainage Nose: Denies discharge, stuffiness, itching, nosebleed, sinus pain Throat: Denies sore throat, hoarseness, dry mouth, sores, thrush Eyes: Denies loss/changes in vision, pain, redness, blurry/double vision, flashing lights Cardiovascular: Denies chest pain/discomfort, tightness, palpitations, shortness of breath with activity, difficulty lying down, swelling, sudden awakening with shortness of breath Respiratory: Denies shortness of breath, cough, sputum production, wheezing Gastrointestinal: Denies  dysphasia, heartburn, change in appetite, nausea, change in bowel habits, rectal bleeding, constipation, diarrhea, yellow skin or eyes Genitourinary: Denies frequency, urgency, burning/pain, blood in urine, incontinence, change in urinary strength. Musculoskeletal: Denies muscle/joint pain, stiffness, back pain, redness or swelling of joints, trauma Skin: Denies rashes, lumps, itching, dryness, color changes, or hair/nail changes Neurological: Denies dizziness, fainting, seizures, weakness, numbness, tingling, tremor Psychiatric - Denies nervousness, stress, depression or memory loss Endocrine: Denies heat or cold intolerance, sweating, frequent urination, excessive thirst, changes in appetite Hematologic: Denies ease of bruising or bleeding     Objective:     BP (!) 144/88 (BP Location: Left Arm, Patient Position: Sitting, Cuff Size: Large)   Pulse 92   Temp 98.8 F (37.1 C) (Oral)   Resp 14   Ht 5\' 10"  (1.778 m)   Wt 211 lb (95.7 kg)   SpO2 96%   BMI 30.28 kg/m  Nursing note and vital signs reviewed.  Physical Exam  Constitutional: He is oriented to person, place, and time. He appears well-developed and well-nourished.  HENT:  Head: Normocephalic.  Right Ear: Hearing, tympanic membrane, external ear and ear canal normal.  Left Ear: Hearing, tympanic membrane, external ear and ear canal normal.  Nose: Nose normal.  Mouth/Throat: Uvula is midline, oropharynx is clear and moist and mucous membranes are normal.  Eyes: Conjunctivae and EOM are normal. Pupils are equal, round, and reactive to light.  Neck: Neck supple. No JVD present. No tracheal deviation present. No thyromegaly present.  Cardiovascular: Normal rate, regular rhythm, normal heart sounds and intact distal pulses.   Pulmonary/Chest: Effort normal and breath sounds normal.  Abdominal: Soft. Bowel sounds are normal. He exhibits no distension and no mass. There is no tenderness. There is no rebound and no guarding.    Musculoskeletal: Normal range of motion. He exhibits no edema or tenderness.  Lymphadenopathy:    He has no cervical adenopathy.  Neurological: He is alert and oriented to person, place, and time. He has normal reflexes. No cranial nerve deficit. He exhibits normal muscle tone. Coordination normal.  Skin: Skin is warm and dry.  Psychiatric: He has a normal mood and affect. His behavior is normal. Judgment and thought content normal.       Assessment & Plan:   Problem List Items Addressed This Visit      Cardiovascular and Mediastinum   HTN (hypertension)    The pressure slightly elevated today above goal 140/90 with current medication regimen. Indicates home blood pressures are slightly lower. Denies worst headache of life with no symptoms of end organ damage noted on physical exam. Continue current dosage of lisinopril and amlodipine. Obtaining medication remains a challenge secondary to recent job loss. Encouraged  to monitor blood pressure at home and follow low-sodium diet. Continue to monitor.        Endocrine   Diabetes ([place])    Previous A1c of 10.0 indicating poorly controlled type 2 diabetes. Currently managed through endocrinology. Discussed importance of taking medication as prescribed and monitoring blood sugars at home. Continue current dosage of metformin and glimepiride. Discussed possible resources to be used to obtain medication well unemployed. Denies numbness and tingling or symptoms of end organ damage. Pneumovax is up-to-date. Obtain urine microalbumin and A1c. Follow-up and changes of medication per endocrinology. Continue to monitor.        Other   Dyslipidemia    Appears stable and currently maintained on atorvastatin with no adverse side effects. Continue current dosage of atorvastatin pending lipid profile results.      Routine general medical examination at a health care facility - Primary    1) Anticipatory Guidance: Discussed importance of wearing a  seatbelt while driving and not texting while driving; changing batteries in smoke detector at least once annually; wearing suntan lotion when outside; eating a balanced and moderate diet; getting physical activity at least 30 minutes per day.  2) Immunizations / Screenings / Labs:  Declines Zostavax. All other immunizations are up to date per recommendations. Declines screening for Hepatitis C. Obtain PSA for prostate cancer screening. Due for dental and vision exam encouraged to be completed independently. Obtain A1c and urine microalbumin for diabetes screenings. Foot exam up to date. All other screenings are up to date per recommendations. Obtain CBC, CMET, and lipid profile.    Overall well exam with risk factors for cardiovascular disease including diabetes, hypertension and dyslipidemia. Chronic conditions appear to be poorly controlled with the current regimens. This is complicated by a recent loss of a job. He is also contemplating going back to his home country. Recommend increasing physical activity and taking medications as prescribed. Continue other healthy behaviors and choices. Follow up prevention exam in 1 year. Follow up office visit for chronic conditions pending blood work.        Relevant Orders   CBC   Comprehensive metabolic panel   PSA   Lipid panel   Hemoglobin A1c   Urine Microalbumin w/creat. ratio       I am having Mr. Mahajan maintain his amLODipine, atorvastatin, ONETOUCH VERIO, glucose blood, ONETOUCH DELICA LANCETS 50V, glimepiride, pioglitazone, metFORMIN, and lisinopril.   Follow-up: Return in about 3 months (around 09/02/2016), or if symptoms worsen or fail to improve.   Mauricio Po, FNP

## 2016-06-04 NOTE — Assessment & Plan Note (Signed)
Appears stable and currently maintained on atorvastatin with no adverse side effects. Continue current dosage of atorvastatin pending lipid profile results.

## 2016-06-04 NOTE — Patient Instructions (Signed)
Thank you for choosing [facility].  SUMMARY AND INSTRUCTIONS:  Check out [email or web address].com and GoodRx.com  Check on the price of Metformin and Glipizide.  Medication:  Continue to take your medications as prescribed.   Your prescription(s) have been submitted to your pharmacy or been printed and provided for you. Please take as directed and contact our office if you believe you are having problem(s) with the medication(s) or have any questions.  Labs:  Please stop by the lab on the lower level of the building for your blood work. Your results will be released to [place] (or called to you) after review, usually within 72 hours after test completion. If any changes need to be made, you will be notified at that same time.  1.) The lab is open from 7:30am to 5:30 pm Monday-Friday 2.) No appointment is necessary 3.) Fasting (if needed) is 6-8 hours after food and drink; black coffee and water are okay   Follow up:  If your symptoms worsen or fail to improve, please contact our office for further instruction, or in case of emergency go directly to the emergency room at the closest medical facility.   Health Maintenance, Male A healthy lifestyle and preventative care can promote health and wellness.  Maintain regular health, dental, and eye exams.  Eat a healthy diet. Foods like vegetables, fruits, whole grains, low-fat dairy products, and lean protein foods contain the nutrients you need and are low in calories. Decrease your intake of foods high in solid fats, added sugars, and salt. Get information about a proper diet from your health care provider, if necessary.  Regular physical exercise is one of the most important things you can do for your health. Most adults should get at least 150 minutes of moderate-intensity exercise (any activity that increases your heart rate and causes you to sweat) each week. In addition, most adults need muscle-strengthening exercises on 2 or more days  a week.   Maintain a healthy weight. The body mass index (BMI) is a screening tool to identify possible weight problems. It provides an estimate of body fat based on height and weight. Your health care provider can find your BMI and can help you achieve or maintain a healthy weight. For males 20 years and older:  A BMI below 18.5 is considered underweight.  A BMI of 18.5 to 24.9 is normal.  A BMI of 25 to 29.9 is considered overweight.  A BMI of 30 and above is considered obese.  Maintain normal blood lipids and cholesterol by exercising and minimizing your intake of saturated fat. Eat a balanced diet with plenty of fruits and vegetables. Blood tests for lipids and cholesterol should begin at age 66 and be repeated every 5 years. If your lipid or cholesterol levels are high, you are over age 39, or you are at high risk for heart disease, you may need your cholesterol levels checked more frequently.Ongoing high lipid and cholesterol levels should be treated with medicines if diet and exercise are not working.  If you smoke, find out from your health care provider how to quit. If you do not use tobacco, do not start.  Lung cancer screening is recommended for adults aged 78-80 years who are at high risk for developing lung cancer because of a history of smoking. A yearly low-dose CT scan of the lungs is recommended for people who have at least a 30-pack-year history of smoking and are current smokers or have quit within the past 15 years. A  pack year of smoking is smoking an average of 1 pack of cigarettes a day for 1 year (for example, a 30-pack-year history of smoking could mean smoking 1 pack a day for 30 years or 2 packs a day for 15 years). Yearly screening should continue until the smoker has stopped smoking for at least 15 years. Yearly screening should be stopped for people who develop a health problem that would prevent them from having lung cancer treatment.  If you choose to drink  alcohol, do not have more than 2 drinks per day. One drink is considered to be 12 oz (360 mL) of beer, 5 oz (150 mL) of wine, or 1.5 oz (45 mL) of liquor.  Avoid the use of street drugs. Do not share needles with anyone. Ask for help if you need support or instructions about stopping the use of drugs.  High blood pressure causes heart disease and increases the risk of stroke. High blood pressure is more likely to develop in:  People who have blood pressure in the end of the normal range (100-139/85-89 mm Hg).  People who are overweight or obese.  People who are African American.  If you are 63-92 years of age, have your blood pressure checked every 3-5 years. If you are 11 years of age or older, have your blood pressure checked every year. You should have your blood pressure measured twice-once when you are at a hospital or clinic, and once when you are not at a hospital or clinic. Record the average of the two measurements. To check your blood pressure when you are not at a hospital or clinic, you can use:  An automated blood pressure machine at a pharmacy.  A home blood pressure monitor.  If you are 82-20 years old, ask your health care provider if you should take aspirin to prevent heart disease.  Diabetes screening involves taking a blood sample to check your fasting blood sugar level. This should be done once every 3 years after age 93 if you are at a normal weight and without risk factors for diabetes. Testing should be considered at a younger age or be carried out more frequently if you are overweight and have at least 1 risk factor for diabetes.  Colorectal cancer can be detected and often prevented. Most routine colorectal cancer screening begins at the age of 25 and continues through age 75. However, your health care provider may recommend screening at an earlier age if you have risk factors for colon cancer. On a yearly basis, your health care provider may provide home test kits to  check for hidden blood in the stool. A small camera at the end of a tube may be used to directly examine the colon (sigmoidoscopy or colonoscopy) to detect the earliest forms of colorectal cancer. Talk to your health care provider about this at age 22 when routine screening begins. A direct exam of the colon should be repeated every 5-10 years through age 24, unless early forms of precancerous polyps or small growths are found.  People who are at an increased risk for hepatitis B should be screened for this virus. You are considered at high risk for hepatitis B if:  You were born in a country where hepatitis B occurs often. Talk with your health care provider about which countries are considered high risk.  Your parents were born in a high-risk country and you have not received a shot to protect against hepatitis B (hepatitis B vaccine).  You have  HIV or AIDS.  You use needles to inject street drugs.  You live with, or have sex with, someone who has hepatitis B.  You are a man who has sex with other men (MSM).  You get hemodialysis treatment.  You take certain medicines for conditions like cancer, organ transplantation, and autoimmune conditions.  Hepatitis C blood testing is recommended for all people born from [birth year] through 1967 and any individual with known risk factors for hepatitis C.  Healthy men should no longer receive prostate-specific antigen (PSA) blood tests as part of routine cancer screening. Talk to your health care provider about prostate cancer screening.  Testicular cancer screening is not recommended for adolescents or adult males who have no symptoms. Screening includes self-exam, a health care provider exam, and other screening tests. Consult with your health care provider about any symptoms you have or any concerns you have about testicular cancer.  Practice safe sex. Use condoms and avoid high-risk sexual practices to reduce the spread of sexually transmitted  infections (STIs).  You should be screened for STIs, including gonorrhea and chlamydia if:  You are sexually active and are younger than 24 years.  You are older than 24 years, and your health care provider tells you that you are at risk for this type of infection.  Your sexual activity has changed since you were last screened, and you are at an increased risk for chlamydia or gonorrhea. Ask your health care provider if you are at risk.  If you are at risk of being infected with HIV, it is recommended that you take a prescription medicine daily to prevent HIV infection. This is called pre-exposure prophylaxis (PrEP). You are considered at risk if:  You are a man who has sex with other men (MSM).  You are a heterosexual man who is sexually active with multiple partners.  You take drugs by injection.  You are sexually active with a partner who has HIV.  Talk with your health care provider about whether you are at high risk of being infected with HIV. If you choose to begin PrEP, you should first be tested for HIV. You should then be tested every 3 months for as long as you are taking PrEP.  Use sunscreen. Apply sunscreen liberally and repeatedly throughout the day. You should seek shade when your shadow is shorter than you. Protect yourself by wearing long sleeves, pants, a wide-brimmed hat, and sunglasses year round whenever you are outdoors.  Tell your health care provider of new moles or changes in moles, especially if there is a change in shape or color. Also, tell your health care provider if a mole is larger than the size of a pencil eraser.  A one-time screening for abdominal aortic aneurysm (AAA) and surgical repair of large AAAs by ultrasound is recommended for men aged 52-75 years who are current or former smokers.  Stay current with your vaccines (immunizations). This information is not intended to replace advice given to you by your health care provider. Make sure you discuss  any questions you have with your health care provider. Document Released: 10/27/2007 Document Revised: 05/21/2014 Document Reviewed: 02/01/2015 Elsevier Interactive Patient Education  2017 [employer].

## 2016-06-08 ENCOUNTER — Other Ambulatory Visit (INDEPENDENT_AMBULATORY_CARE_PROVIDER_SITE_OTHER): Payer: Self-pay

## 2016-06-08 DIAGNOSIS — Z0000 Encounter for general adult medical examination without abnormal findings: Secondary | ICD-10-CM

## 2016-06-08 LAB — COMPREHENSIVE METABOLIC PANEL
ALBUMIN: 4.2 g/dL (ref 3.5–5.2)
ALK PHOS: 54 U/L (ref 39–117)
ALT: 15 U/L (ref 0–53)
AST: 12 U/L (ref 0–37)
BUN: 17 mg/dL (ref 6–23)
CALCIUM: 9.3 mg/dL (ref 8.4–10.5)
CHLORIDE: 103 meq/L (ref 96–112)
CO2: 31 mEq/L (ref 19–32)
Creatinine, Ser: 0.9 mg/dL (ref 0.40–1.50)
GFR: 90.76 mL/min (ref >60.00)
Glucose, Bld: 195 mg/dL — ABNORMAL HIGH (ref 70–99)
POTASSIUM: 4.4 meq/L (ref 3.5–5.1)
Sodium: 140 mEq/L (ref 135–145)
TOTAL PROTEIN: 7.6 g/dL (ref 6.0–8.3)
Total Bilirubin: 0.6 mg/dL (ref 0.2–1.2)

## 2016-06-08 LAB — PSA: PSA: 0.89 ng/mL (ref 0.10–4.00)

## 2016-06-08 LAB — LIPID PANEL
CHOLESTEROL: 261 mg/dL — AB (ref 0–200)
HDL: 37.5 mg/dL — AB (ref >39.00)
LDL Cholesterol: 191 mg/dL — ABNORMAL HIGH (ref 0–99)
NonHDL: 223.81
TRIGLYCERIDES: 165 mg/dL — AB (ref 0.0–149.0)
Total CHOL/HDL Ratio: 7
VLDL: 33 mg/dL (ref 0.0–40.0)

## 2016-06-08 LAB — CBC
HEMATOCRIT: 40.7 % (ref 39.0–52.0)
HEMOGLOBIN: 13.2 g/dL (ref 13.0–17.0)
MCHC: 32.5 g/dL (ref 30.0–36.0)
MCV: 77.9 fl — AB (ref 78.0–100.0)
PLATELETS: 255 10*3/uL (ref 150.0–400.0)
RBC: 5.22 Mil/uL (ref 4.22–5.81)
RDW: 15.4 % (ref 11.5–15.5)
WBC: 4.6 10*3/uL (ref 4.0–10.5)

## 2016-06-08 LAB — MICROALBUMIN / CREATININE URINE RATIO
CREATININE, U: 171.6 mg/dL
MICROALB/CREAT RATIO: 6.2 mg/g (ref 0.0–30.0)
Microalb, Ur: 10.6 mg/dL — ABNORMAL HIGH (ref 0.0–1.9)

## 2016-06-08 LAB — HEMOGLOBIN A1C: Hgb A1c MFr Bld: 10.3 % — ABNORMAL HIGH (ref 4.6–6.5)

## 2016-06-11 ENCOUNTER — Other Ambulatory Visit: Payer: Self-pay | Admitting: Family

## 2016-06-11 MED ORDER — ATORVASTATIN CALCIUM 40 MG PO TABS: 40.0000 mg | ORAL_TABLET | Freq: Every day | ORAL | 0 refills | Status: DC

## 2016-06-13 MED ORDER — AMLODIPINE BESYLATE 10 MG PO TABS: 10.0000 mg | ORAL_TABLET | Freq: Every day | ORAL | 1 refills | Status: DC

## 2016-06-13 MED ORDER — LISINOPRIL 40 MG PO TABS: 40.0000 mg | ORAL_TABLET | Freq: Every day | ORAL | 1 refills | Status: DC

## 2016-08-21 ENCOUNTER — Ambulatory Visit: Payer: Self-pay | Admitting: Endocrinology

## 2016-08-28 ENCOUNTER — Other Ambulatory Visit: Payer: Self-pay | Admitting: Family

## 2016-08-31 ENCOUNTER — Ambulatory Visit: Payer: Self-pay | Admitting: Endocrinology

## 2016-08-31 ENCOUNTER — Telehealth: Payer: Self-pay | Admitting: Endocrinology

## 2016-08-31 NOTE — Telephone Encounter (Deleted)
Patient called [facility] stated that she is having heavy chest pains  That hurts all the way through her back.   and very tired. a couple hour ago b/s 68 feeling dizzy and disoriented all day  08/30/16 @ 7:49 pm

## 2016-08-31 NOTE — Telephone Encounter (Addendum)
Spoke with pt, he states he is not having chest pain or low blood sugar. States he did not call our office. Supervisor was informed and correct pt was contacted.

## 2016-10-26 ENCOUNTER — Ambulatory Visit: Payer: Self-pay | Admitting: Internal Medicine

## 2016-10-27 ENCOUNTER — Ambulatory Visit (INDEPENDENT_AMBULATORY_CARE_PROVIDER_SITE_OTHER): Payer: Self-pay | Admitting: Family Medicine

## 2016-10-27 ENCOUNTER — Encounter: Payer: Self-pay | Admitting: Family Medicine

## 2016-10-27 VITALS — BP 120/70 | HR 85 | Temp 98.0°F | Ht 70.0 in | Wt 198.5 lb

## 2016-10-27 DIAGNOSIS — R1013 Epigastric pain: Secondary | ICD-10-CM

## 2016-10-27 MED ORDER — OMEPRAZOLE 40 MG PO CPDR: 40.0000 mg | DELAYED_RELEASE_CAPSULE | Freq: Every day | ORAL | 1 refills | Status: DC

## 2016-10-27 NOTE — Patient Instructions (Addendum)
I most strongly suspect this could be your gallbladder. I would like to do an ultrasound but I understand your concern about cost.   With your history of stomach ulcer, we discussed option of treating potential ulcer with prilosec 40mg  before breakfast for 1-2 months.   If you are not improving on this regimen, please contact Terri Piedra as you will need to proceed with ultrasound  We discussed doing bloodwork which you preferred not to do as well do to cost. We may have to do this as well if not improving or worsening or if ultrasound Is negative but pain continues  Also I would encourage you to follow up with Dr. Loanne Drilling about your diabetes

## 2016-10-27 NOTE — Progress Notes (Signed)
Subjective:  Jon [name] is a 61 y.o. year old very pleasant male patient who presents for/with See problem oriented charting ROS- no fever, chills. Has had some nausea. 1 episode of vomiting. Has had abdominal pain   Past Medical History-  Patient Active Problem List   Diagnosis Date Noted  . Routine general medical examination at a health care facility 06/04/2016  . Trigger finger, acquired 02/24/2016  . Encounter for health maintenance examination in adult 03/17/2015  . Erectile dysfunction 03/17/2015  . Diabetes ([place]) 03/12/2015  . Dyslipidemia 03/12/2015  . HTN (hypertension) 03/12/2015    Medications- reviewed and updated Current Outpatient Prescriptions  Medication Sig Dispense Refill  . amLODipine (NORVASC) 10 MG tablet Take 1 tablet (10 mg total) by mouth daily. 90 tablet 1  . atorvastatin (LIPITOR) 40 MG tablet TAKE ONE TABLET BY MOUTH ONCE DAILY 90 tablet 1  . Blood Glucose Monitoring Suppl (ONETOUCH VERIO) w/Device KIT 1 Device by Does not apply route once. 1 kit 0  . glimepiride (AMARYL) 4 MG tablet Take 1 tablet (4 mg total) by mouth daily with breakfast. 30 tablet 11  . glucose blood (ONETOUCH VERIO) test strip 1 each by Other route daily. And lancets 1/day 100 each 12  . lisinopril (PRINIVIL,ZESTRIL) 40 MG tablet Take 1 tablet (40 mg total) by mouth daily. 90 tablet 1  . metFORMIN (GLUCOPHAGE-XR) 500 MG 24 hr tablet TAKE TWO TABLETS BY MOUTH TWICE DAILY WITH A MEAL 120 tablet 3  . ONETOUCH DELICA LANCETS 06Y MISC Use to check blood sugar 1 time per day. DX Code: E11.9 100 each 2  . pioglitazone (ACTOS) 45 MG tablet Take 1 tablet (45 mg total) by mouth daily. 30 tablet 11   No current facility-administered medications for this visit.     Objective: BP 120/70 (BP Location: Left Arm, Patient Position: Sitting, Cuff Size: Normal)   Pulse 85   Temp 98 F (36.7 C) (Oral)   Ht '5\' 10"'$  (1.778 m)   Wt 198 lb 8 oz (90 kg)   SpO2 96%   BMI 28.48 kg/m  Gen: NAD,  resting comfortably, well appearing CV: RRR no murmurs rubs or gallops Lungs: CTAB no crackles, wheeze, rhonchi Abdomen: soft/nontender/nondistended/normal bowel sounds. No rebound or guarding.  Ext: no edema Skin: warm, dry  Assessment/Plan:  Epigastric abdominal pain S: abdominal pain for 1 week. About 3x a day gets epigastric pain. No clear relation to meals that he can tell. Pain was severe 9/10 about a week ago 1x and vomited- lasted about an hour in total. Pain level about 7/10 at other times. Does usually get nauseous.   Had an ulcer years ago which caused similar. States had surgery for stomach ulcer years ago.   Bowel movements twice a day and soft- no recent change  A/P: epigastric abdominal pain intermittent  Patient Instructions  I most strongly suspect this could be your gallbladder. I would like to do an ultrasound but I understand your concern about cost.   With your history of stomach ulcer, we discussed option of treating potential ulcer with prilosec '40mg'$  before breakfast for 1-2 months.   If you are not improving on this regimen, please contact [facility]ied[facility] as you will need to proceed with ultrasound  We discussed doing bloodwork which you preferred not to do as well do to cost. We may have to do this as well if not improving or worsening or if ultrasound Is negative but pain continues  Also I would encourage you  to follow up with Dr. Loanne Drilling about your diabetes  Doubt DKA related with type II diabetes and CBGs often in 100s but never above low 200s per hsi report  Meds ordered this encounter  Medications  . omeprazole (PRILOSEC) 40 MG capsule    Sig: Take 1 capsule (40 mg total) by mouth daily.    Dispense:  30 capsule    Refill:  1    Return precautions advised.  Garret Reddish, MD

## 2016-11-30 NOTE — Telephone Encounter (Signed)
error 

## 2016-12-02 ENCOUNTER — Encounter (HOSPITAL_COMMUNITY): Payer: Self-pay | Admitting: Emergency Medicine

## 2016-12-02 ENCOUNTER — Emergency Department (HOSPITAL_COMMUNITY): Payer: Self-pay

## 2016-12-02 ENCOUNTER — Emergency Department (HOSPITAL_COMMUNITY)
Admission: EM | Admit: 2016-12-02 | Discharge: 2016-12-02 | Disposition: A | Discharge status: Home / Self Care | Payer: Self-pay | Attending: Emergency Medicine | Admitting: Emergency Medicine

## 2016-12-02 DIAGNOSIS — I10 Essential (primary) hypertension: Secondary | ICD-10-CM | POA: Insufficient documentation

## 2016-12-02 DIAGNOSIS — N201 Calculus of ureter: Secondary | ICD-10-CM | POA: Insufficient documentation

## 2016-12-02 DIAGNOSIS — Z79899 Other long term (current) drug therapy: Secondary | ICD-10-CM | POA: Insufficient documentation

## 2016-12-02 DIAGNOSIS — Z7982 Long term (current) use of aspirin: Secondary | ICD-10-CM | POA: Insufficient documentation

## 2016-12-02 DIAGNOSIS — E119 Type 2 diabetes mellitus without complications: Secondary | ICD-10-CM | POA: Insufficient documentation

## 2016-12-02 LAB — CBC WITH DIFFERENTIAL/PLATELET
Basophils Absolute: 0 10*3/uL (ref 0.0–0.1)
Basophils Relative: 0 %
EOS PCT: 1 %
Eosinophils Absolute: 0.1 10*3/uL (ref 0.0–0.7)
HEMATOCRIT: 38.9 % — AB (ref 39.0–52.0)
Hemoglobin: 12.6 g/dL — ABNORMAL LOW (ref 13.0–17.0)
Lymphocytes Relative: 12 %
Lymphs Abs: 1.1 10*3/uL (ref 0.7–4.0)
MCH: 25.3 pg — ABNORMAL LOW (ref 26.0–34.0)
MCHC: 32.4 g/dL (ref 30.0–36.0)
MCV: 78 fL (ref 78.0–100.0)
MONO ABS: 0.5 10*3/uL (ref 0.1–1.0)
MONOS PCT: 6 %
NEUTROS ABS: 7.4 10*3/uL (ref 1.7–7.7)
Neutrophils Relative %: 81 %
Platelets: 266 10*3/uL (ref 150–400)
RBC: 4.99 MIL/uL (ref 4.22–5.81)
RDW: 14.6 % (ref 11.5–15.5)
WBC: 9.2 10*3/uL (ref 4.0–10.5)

## 2016-12-02 LAB — URINALYSIS, COMPLETE (UACMP) WITH MICROSCOPIC
BACTERIA UA: NONE SEEN
Bilirubin Urine: NEGATIVE
Glucose, UA: NEGATIVE mg/dL
Hgb urine dipstick: NEGATIVE
Ketones, ur: NEGATIVE mg/dL
Leukocytes, UA: NEGATIVE
NITRITE: NEGATIVE
PH: 6 (ref 5.0–8.0)
Protein, ur: NEGATIVE mg/dL
SPECIFIC GRAVITY, URINE: 1.027 (ref 1.005–1.030)
WBC, UA: NONE SEEN WBC/hpf (ref 0–5)

## 2016-12-02 LAB — COMPREHENSIVE METABOLIC PANEL
ALBUMIN: 4.2 g/dL (ref 3.5–5.0)
ALK PHOS: 59 U/L (ref 38–126)
ALT: 18 U/L (ref 17–63)
AST: 24 U/L (ref 15–41)
Anion gap: 10 (ref 5–15)
BILIRUBIN TOTAL: 0.7 mg/dL (ref 0.3–1.2)
BUN: 21 mg/dL — AB (ref 6–20)
CO2: 23 mmol/L (ref 22–32)
CREATININE: 1.25 mg/dL — AB (ref 0.61–1.24)
Calcium: 9.3 mg/dL (ref 8.9–10.3)
Chloride: 102 mmol/L (ref 101–111)
GFR calc Af Amer: >60 mL/min (ref >60)
GFR calc non Af Amer: >60 mL/min (ref >60)
GLUCOSE: 190 mg/dL — AB (ref 65–99)
POTASSIUM: 4.3 mmol/L (ref 3.5–5.1)
Sodium: 135 mmol/L (ref 135–145)
TOTAL PROTEIN: 7.6 g/dL (ref 6.5–8.1)

## 2016-12-02 LAB — LIPASE, BLOOD: Lipase: 36 U/L (ref 11–51)

## 2016-12-02 LAB — POC OCCULT BLOOD, ED: Fecal Occult Bld: NEGATIVE

## 2016-12-02 MED ORDER — PANTOPRAZOLE SODIUM 40 MG IV SOLR
40.0000 mg | Freq: Once | INTRAVENOUS | Status: AC
  Administered 2016-12-02: 40 mg via INTRAVENOUS
  Filled 2016-12-02: qty 40

## 2016-12-02 MED ORDER — ONDANSETRON 4 MG PO TBDP: 4.0000 mg | ORAL_TABLET | ORAL | 0 refills | Status: DC | PRN

## 2016-12-02 MED ORDER — IOPAMIDOL (ISOVUE-300) INJECTION 61%
INTRAVENOUS | Status: AC
  Administered 2016-12-02: 100 mL
  Filled 2016-12-02: qty 100

## 2016-12-02 MED ORDER — PANTOPRAZOLE SODIUM 20 MG PO TBEC: 20.0000 mg | DELAYED_RELEASE_TABLET | Freq: Every day | ORAL | 0 refills | Status: DC

## 2016-12-02 MED ORDER — TAMSULOSIN HCL 0.4 MG PO CAPS
0.4000 mg | ORAL_CAPSULE | Freq: Once | ORAL | Status: AC
  Administered 2016-12-02: 0.4 mg via ORAL
  Filled 2016-12-02: qty 1

## 2016-12-02 MED ORDER — MORPHINE SULFATE (PF) 4 MG/ML IV SOLN
6.0000 mg | Freq: Once | INTRAVENOUS | Status: AC
  Administered 2016-12-02: 6 mg via INTRAVENOUS
  Filled 2016-12-02: qty 2

## 2016-12-02 MED ORDER — SODIUM CHLORIDE 0.9 % IV SOLN
Freq: Once | INTRAVENOUS | Status: AC
  Administered 2016-12-02: 12:00:00 via INTRAVENOUS

## 2016-12-02 MED ORDER — OXYCODONE-ACETAMINOPHEN 5-325 MG PO TABS: 1.0000 | ORAL_TABLET | ORAL | 0 refills | Status: DC | PRN

## 2016-12-02 MED ORDER — GI COCKTAIL ~~LOC~~
30.0000 mL | Freq: Once | ORAL | Status: AC
  Administered 2016-12-02: 30 mL via ORAL
  Filled 2016-12-02: qty 30

## 2016-12-02 MED ORDER — KETOROLAC TROMETHAMINE 30 MG/ML IJ SOLN
30.0000 mg | Freq: Once | INTRAMUSCULAR | Status: AC
  Administered 2016-12-02: 30 mg via INTRAVENOUS
  Filled 2016-12-02: qty 1

## 2016-12-02 MED ORDER — MORPHINE SULFATE (PF) 4 MG/ML IV SOLN
4.0000 mg | Freq: Once | INTRAVENOUS | Status: AC
  Administered 2016-12-02: 4 mg via INTRAVENOUS
  Filled 2016-12-02: qty 1

## 2016-12-02 MED ORDER — OXYCODONE-ACETAMINOPHEN 5-325 MG PO TABS
1.0000 | ORAL_TABLET | Freq: Once | ORAL | Status: AC
  Administered 2016-12-02: 1 via ORAL
  Filled 2016-12-02: qty 1

## 2016-12-02 MED ORDER — TAMSULOSIN HCL 0.4 MG PO CAPS: 0.4000 mg | ORAL_CAPSULE | Freq: Every day | ORAL | 0 refills | Status: DC

## 2016-12-02 MED ORDER — IBUPROFEN 600 MG PO TABS: 600.0000 mg | ORAL_TABLET | Freq: Four times a day (QID) | ORAL | 0 refills | Status: DC | PRN

## 2016-12-02 MED ORDER — PROMETHAZINE HCL 25 MG/ML IJ SOLN
25.0000 mg | Freq: Once | INTRAMUSCULAR | Status: AC
  Administered 2016-12-02: 25 mg via INTRAVENOUS
  Filled 2016-12-02: qty 1

## 2016-12-02 MED ORDER — ONDANSETRON HCL 4 MG/2ML IJ SOLN
4.0000 mg | Freq: Once | INTRAMUSCULAR | Status: AC
  Administered 2016-12-02: 4 mg via INTRAVENOUS
  Filled 2016-12-02: qty 2

## 2016-12-02 NOTE — ED Notes (Signed)
Strainer education done with patient.  Cup provided for possible stone sample at home.  Urine sample here strained with no stone present.

## 2016-12-02 NOTE — ED Provider Notes (Signed)
[facility]-EMERGENCY DEPT Provider Note   CSN: [id number] Arrival date & time: 12/02/16  0906     History   Chief Complaint Chief Complaint  Patient presents with  . Abdominal Pain    [name] is a 61 y.o. male.  HPI Patient has been having problems with epigastric pain that have waxed and waned since about a month ago. Patient saw his PCP with epigastric and left upper quadrant pain mid June. He reports the pain was similar but nowhere near as severe as it is today. Patient does have a history of gastric ulcer which required surgical intervention in 1984. Patient was empirically started on Prilosec at his PCP visit. Patient reports that due to his concerns for taking "too much medication" (diabetic and hypertensive medications). He only took a Prilosec for a few days and then discontinued it. He reports his symptoms resolved and he was not having any problems with pain episodes and also reports he was not having problems with pain with eating. He reports 2 days ago pain started while he was at work and has been getting progressively worse since that time. He reports severe aching and burning pain in his left upper quadrant with some radiation to the back. Nausea with no vomiting. He denies bloody or dark-looking stool. He denies pain or burning with urination. He denies constipation or diarrhea. Past Medical History:  Diagnosis Date  . Diabetes mellitus without complication (HCC)   . Hyperlipidemia   . Hypertension   . Kidney stones   . Stomach ulcer     Patient Active Problem List   Diagnosis Date Noted  . Routine general medical examination at a health care facility 06/04/2016  . Trigger finger, acquired 02/24/2016  . Encounter for health maintenance examination in adult 03/17/2015  . Erectile dysfunction 03/17/2015  . Diabetes ([facility]) 03/12/2015  . Dyslipidemia 03/12/2015  . HTN (hypertension) 03/12/2015    Past Surgical History:  Procedure Laterality Date  . HERNIA  REPAIR    . Stomach ulcer         Home Medications    Prior to Admission medications   Medication Sig Start Date End Date Taking? Authorizing Provider  amLODipine (NORVASC) 10 MG tablet Take 1 tablet (10 mg total) by mouth daily. 06/13/16  Yes Veryl Speak, FNP  aspirin 81 MG chewable tablet Chew 81 mg by mouth daily.   Yes [provider]  atorvastatin (LIPITOR) 40 MG tablet TAKE ONE TABLET BY MOUTH ONCE DAILY 08/29/16  Yes Veryl Speak, FNP  Blood Glucose Monitoring Suppl (ONETOUCH VERIO) w/Device KIT 1 Device by Does not apply route once. 08/01/15  Yes Romero Belling, MD  glimepiride (AMARYL) 4 MG tablet Take 1 tablet (4 mg total) by mouth daily with breakfast. 02/24/16  Yes Romero Belling, MD  glucose blood (ONETOUCH VERIO) test strip 1 each by Other route daily. And lancets 1/day 08/01/15  Yes Romero Belling, MD  lisinopril (PRINIVIL,ZESTRIL) 40 MG tablet Take 1 tablet (40 mg total) by mouth daily. 06/13/16  Yes Veryl Speak, FNP  metFORMIN (GLUCOPHAGE-XR) 500 MG 24 hr tablet TAKE TWO TABLETS BY MOUTH TWICE DAILY WITH A MEAL 04/13/16  Yes Romero Belling, MD  omeprazole (PRILOSEC) 40 MG capsule Take 1 capsule (40 mg total) by mouth daily. 10/27/16  Yes Shelva Majestic, MD  [facility] DELICA LANCETS 33G MISC Use to check blood sugar 1 time per day. DX Code: E11.9 08/02/15  Yes Romero Belling, MD  ibuprofen (ADVIL,MOTRIN) 600 MG tablet  Take 1 tablet (600 mg total) by mouth every 6 (six) hours as needed. 12/02/16   Charlesetta Shanks, MD  ondansetron (ZOFRAN ODT) 4 MG disintegrating tablet Take 1 tablet (4 mg total) by mouth every 4 (four) hours as needed for nausea or vomiting. 12/02/16   Charlesetta Shanks, MD  oxyCODONE-acetaminophen (PERCOCET) 5-325 MG tablet Take 1-2 tablets by mouth every 4 (four) hours as needed. 12/02/16   Charlesetta Shanks, MD  pantoprazole (PROTONIX) 20 MG tablet Take 1 tablet (20 mg total) by mouth daily. 12/02/16   Charlesetta Shanks, MD  pioglitazone (ACTOS) 45 MG  tablet Take 1 tablet (45 mg total) by mouth daily. Patient not taking: Reported on 12/02/2016 02/24/16   Renato Shin, MD  tamsulosin (FLOMAX) 0.4 MG CAPS capsule Take 1 capsule (0.4 mg total) by mouth daily. 12/02/16   Charlesetta Shanks, MD    Family History Family History  Problem Relation Age of Onset  . Diabetes Father   . Stroke Father   . Hypertension Father   . Hypertension Mother   . Diabetes Sister   . Diabetes Son   . Diabetes Daughter     Social History Social History  Substance Use Topics  . Smoking status: Never Smoker  . Smokeless tobacco: Never Used  . Alcohol use 0.6 - 1.2 oz/week    1 - 2 Glasses of wine per week     Comment: socially     Allergies   Patient has no known allergies.   Review of Systems Review of Systems 10 Systems reviewed and are negative for acute change except as noted in the HPI.   Physical Exam Updated Vital Signs BP 124/64   Pulse 62   Temp 98.2 F (36.8 C) (Oral)   Resp 15   Ht '5\' 10"'$  (1.778 m)   Wt 89.8 kg (198 lb)   SpO2 98%   BMI 28.41 kg/m   Physical Exam  Constitutional: He is oriented to person, place, and time. He appears well-developed and well-nourished.  Patient appears to be in moderate pain. He is alert and nontoxic.  HENT:  Head: Normocephalic and atraumatic.  Eyes: Conjunctivae and EOM are normal.  Neck: Neck supple.  Cardiovascular: Normal rate, regular rhythm, normal heart sounds and intact distal pulses.   No murmur heard. Pulmonary/Chest: Effort normal and breath sounds normal. No respiratory distress.  Abdominal: Soft. He exhibits no distension. There is tenderness. There is no guarding.  Epigastric and left upper quadrant pain to palpation. Lower abdomen nontender. No guarding.  Genitourinary:  Genitourinary Comments: Rectal exam nontender. Trace brown yellow stool in the vault. No melena.  Musculoskeletal: He exhibits no edema or tenderness.  Neurological: He is alert and oriented to person,  place, and time. No cranial nerve deficit. He exhibits normal muscle tone. Coordination normal.  Skin: Skin is warm and dry.  Psychiatric: He has a normal mood and affect.  Nursing note and vitals reviewed.    ED Treatments / Results  Labs (all labs ordered are listed, but only abnormal results are displayed) Labs Reviewed  COMPREHENSIVE METABOLIC PANEL - Abnormal; Notable for the following:       Result Value   Glucose, Bld 190 (*)    BUN 21 (*)    Creatinine, Ser 1.25 (*)    All other components within normal limits  CBC WITH DIFFERENTIAL/PLATELET - Abnormal; Notable for the following:    Hemoglobin 12.6 (*)    HCT 38.9 (*)    MCH 25.3 (*)  All other components within normal limits  URINALYSIS, COMPLETE (UACMP) WITH MICROSCOPIC - Abnormal; Notable for the following:    Color, Urine STRAW (*)    Squamous Epithelial / LPF 0-5 (*)    All other components within normal limits  LIPASE, BLOOD  POC OCCULT BLOOD, ED    EKG  EKG Interpretation None       Radiology No results found.  Procedures Procedures (including critical care time)  Medications Ordered in ED Medications  pantoprazole (PROTONIX) injection 40 mg (40 mg Intravenous Given 12/02/16 0953)  gi cocktail (Maalox,Lidocaine,Donnatal) (30 mLs Oral Given 12/02/16 0957)  morphine 4 MG/ML injection 4 mg (4 mg Intravenous Given 12/02/16 1100)  ondansetron (ZOFRAN) injection 4 mg (4 mg Intravenous Given 12/02/16 1059)  iopamidol (ISOVUE-300) 61 % injection (100 mLs  Contrast Given 12/02/16 1138)  morphine 4 MG/ML injection 6 mg (6 mg Intravenous Given 12/02/16 1224)  promethazine (PHENERGAN) injection 25 mg (25 mg Intravenous Given 12/02/16 1220)  0.9 %  sodium chloride infusion ( Intravenous Stopped 12/02/16 1616)  ketorolac (TORADOL) 30 MG/ML injection 30 mg (30 mg Intravenous Given 12/02/16 1219)  tamsulosin (FLOMAX) capsule 0.4 mg (0.4 mg Oral Given 12/02/16 1422)  oxyCODONE-acetaminophen (PERCOCET/ROXICET) 5-325 MG per  tablet 1 tablet (1 tablet Oral Given 12/02/16 1614)     Initial Impression / Assessment and Plan / ED Course  I have reviewed the triage vital signs and the nursing notes.  Pertinent labs & imaging results that were available during my care of the patient were reviewed by me and considered in my medical decision making (see chart for details).      Final Clinical Impressions(s) / ED Diagnoses   Final diagnoses:  Left ureteral stone   Patient has somewhat atypical presentation for kidney stone. Pain seems significantly epigastric however CT did define a 6 mm ureteral stone. IV pain medications of morphine and Toradol improved patient's pain, he did require second dose of morphine.Marland Kitchen He was also given antiemetics and tamsulosin. After observation and pain treatment, he was able to tolerate oral medications. Plan is for follow-up with urology. Signs and symptoms first return are reviewed. New Prescriptions Discharge Medication List as of 12/02/2016  4:06 PM    START taking these medications   Details  ibuprofen (ADVIL,MOTRIN) 600 MG tablet Take 1 tablet (600 mg total) by mouth every 6 (six) hours as needed., Starting Sun 12/02/2016, Print    ondansetron (ZOFRAN ODT) 4 MG disintegrating tablet Take 1 tablet (4 mg total) by mouth every 4 (four) hours as needed for nausea or vomiting., Starting Sun 12/02/2016, Print    oxyCODONE-acetaminophen (PERCOCET) 5-325 MG tablet Take 1-2 tablets by mouth every 4 (four) hours as needed., Starting Sun 12/02/2016, Print    pantoprazole (PROTONIX) 20 MG tablet Take 1 tablet (20 mg total) by mouth daily., Starting Sun 12/02/2016, Print    tamsulosin (FLOMAX) 0.4 MG CAPS capsule Take 1 capsule (0.4 mg total) by mouth daily., Starting Sun 12/02/2016, Print         Charlesetta Shanks, MD 12/05/16 1540

## 2016-12-02 NOTE — ED Triage Notes (Signed)
Pt states he has been having left upper abd pain off and on for a month. Feels nauseous, no vomiting. Denies diarrhea

## 2016-12-02 NOTE — ED Notes (Signed)
Patient able to ambulate independently  

## 2016-12-07 ENCOUNTER — Ambulatory Visit: Payer: Self-pay | Admitting: Family

## 2016-12-08 ENCOUNTER — Encounter (HOSPITAL_COMMUNITY): Payer: Self-pay

## 2016-12-08 ENCOUNTER — Emergency Department (HOSPITAL_COMMUNITY): Payer: Self-pay

## 2016-12-08 ENCOUNTER — Emergency Department (HOSPITAL_COMMUNITY)
Admission: EM | Admit: 2016-12-08 | Discharge: 2016-12-08 | Disposition: A | Discharge status: Home / Self Care | Payer: Self-pay | Attending: Emergency Medicine | Admitting: Emergency Medicine

## 2016-12-08 DIAGNOSIS — Z7982 Long term (current) use of aspirin: Secondary | ICD-10-CM | POA: Insufficient documentation

## 2016-12-08 DIAGNOSIS — N23 Unspecified renal colic: Secondary | ICD-10-CM | POA: Insufficient documentation

## 2016-12-08 DIAGNOSIS — Z79899 Other long term (current) drug therapy: Secondary | ICD-10-CM | POA: Insufficient documentation

## 2016-12-08 DIAGNOSIS — N200 Calculus of kidney: Secondary | ICD-10-CM | POA: Insufficient documentation

## 2016-12-08 DIAGNOSIS — Z7984 Long term (current) use of oral hypoglycemic drugs: Secondary | ICD-10-CM | POA: Insufficient documentation

## 2016-12-08 DIAGNOSIS — I10 Essential (primary) hypertension: Secondary | ICD-10-CM | POA: Insufficient documentation

## 2016-12-08 DIAGNOSIS — E119 Type 2 diabetes mellitus without complications: Secondary | ICD-10-CM | POA: Insufficient documentation

## 2016-12-08 MED ORDER — OXYCODONE-ACETAMINOPHEN 5-325 MG PO TABS: 1.0000 | ORAL_TABLET | Freq: Four times a day (QID) | ORAL | 0 refills | Status: DC | PRN

## 2016-12-08 MED ORDER — HYDROMORPHONE HCL 1 MG/ML IJ SOLN
1.0000 mg | Freq: Once | INTRAMUSCULAR | Status: AC
  Administered 2016-12-08: 1 mg via INTRAVENOUS
  Filled 2016-12-08: qty 1

## 2016-12-08 MED ORDER — HYDROMORPHONE HCL 1 MG/ML IJ SOLN
0.5000 mg | Freq: Once | INTRAMUSCULAR | Status: AC
  Administered 2016-12-08: 0.5 mg via INTRAVENOUS
  Filled 2016-12-08: qty 1

## 2016-12-08 MED ORDER — KETOROLAC TROMETHAMINE 30 MG/ML IJ SOLN
30.0000 mg | Freq: Once | INTRAMUSCULAR | Status: AC
  Administered 2016-12-08: 30 mg via INTRAVENOUS
  Filled 2016-12-08: qty 1

## 2016-12-08 MED ORDER — SODIUM CHLORIDE 0.9 % IV SOLN
INTRAVENOUS | Status: DC
  Administered 2016-12-08: 12:00:00 via INTRAVENOUS

## 2016-12-08 MED ORDER — ONDANSETRON HCL 4 MG/2ML IJ SOLN
4.0000 mg | Freq: Once | INTRAMUSCULAR | Status: AC
  Administered 2016-12-08: 4 mg via INTRAVENOUS
  Filled 2016-12-08: qty 2

## 2016-12-08 NOTE — ED Notes (Signed)
Pt taken to US

## 2016-12-08 NOTE — ED Notes (Signed)
Patient transported to Ultrasound 

## 2016-12-08 NOTE — Discharge Instructions (Signed)
It was our pleasure to provide your ER care today - we hope that you feel better.  Drink plenty of fluids.   Strain urine.  Continue flomax.   Take motrin or aleve as need for pain. You may also take percocet as need for pain. No driving for the next 6 hours or when taking percocet. Also, do not take tylenol or acetaminophen containing medication when taking percocet.   Follow up with urologist this week - call office this Monday morning to arrange appointment.  Return to the [facility] if worse, worsening or intractable pain, persistent vomiting, fevers, other concern.

## 2016-12-08 NOTE — ED Notes (Signed)
ED Provider at bedside. 

## 2016-12-08 NOTE — ED Notes (Signed)
Patient given urinal and made aware of need for urine sample

## 2016-12-08 NOTE — ED Triage Notes (Signed)
Patient diagnosed with kidney stone last Sunday and reports ongoing left side and flank pain. Taking meds as prescribed with no relief.

## 2016-12-08 NOTE — ED Notes (Signed)
Pt verbalized understanding to call urology Monday morning to set up an appointment. No further questions. VSS, NAD. Pt d/c home with wife driving.

## 2016-12-08 NOTE — ED Provider Notes (Signed)
[facility] DEPT Provider Note   CSN: [id number] Arrival date & time: 12/08/16  1024     History   Chief Complaint Chief Complaint  Patient presents with  . Flank Pain    [name] is a 61 y.o. male.  Patient c/o left flank pain for past 6 days. Pain constant, dull, mod-severe, waxes and wanes in intensity. Left flank laterally is where pain is located. Was dx 6 days ago w 6 mm distal ureteral stone. Nausea. No vomiting. Having normal bms. No dysuria or hematuria. No fever or chills.    The history is provided by the patient.  Flank Pain  Pertinent negatives include no chest pain, no abdominal pain, no headaches and no shortness of breath.    Past Medical History:  Diagnosis Date  . Diabetes mellitus without complication ([place])   . Hyperlipidemia   . Hypertension   . Kidney stones   . Stomach ulcer     Patient Active Problem List   Diagnosis Date Noted  . Routine general medical examination at a health care facility 06/04/2016  . Trigger finger, acquired 02/24/2016  . Encounter for health maintenance examination in adult 03/17/2015  . Erectile dysfunction 03/17/2015  . Diabetes ([place]) 03/12/2015  . Dyslipidemia 03/12/2015  . HTN (hypertension) 03/12/2015    Past Surgical History:  Procedure Laterality Date  . HERNIA REPAIR    . Stomach ulcer         Home Medications    Prior to Admission medications   Medication Sig Start Date End Date Taking? Authorizing Provider  amLODipine (NORVASC) 10 MG tablet Take 1 tablet (10 mg total) by mouth daily. 06/13/16   Golden Circle, FNP  aspirin 81 MG chewable tablet Chew 81 mg by mouth daily.    [provider]  atorvastatin (LIPITOR) 40 MG tablet TAKE ONE TABLET BY MOUTH ONCE DAILY 08/29/16   Golden Circle, FNP  Blood Glucose Monitoring Suppl (ONETOUCH VERIO) w/Device KIT 1 Device by Does not apply route once. 08/01/15   Renato Shin, MD  glimepiride (AMARYL) 4 MG tablet Take 1 tablet (4 mg  total) by mouth daily with breakfast. 02/24/16   Renato Shin, MD  glucose blood (ONETOUCH VERIO) test strip 1 each by Other route daily. And lancets 1/day 08/01/15   Renato Shin, MD  ibuprofen (ADVIL,MOTRIN) 600 MG tablet Take 1 tablet (600 mg total) by mouth every 6 (six) hours as needed. 12/02/16   Charlesetta Shanks, MD  lisinopril (PRINIVIL,ZESTRIL) 40 MG tablet Take 1 tablet (40 mg total) by mouth daily. 06/13/16   Golden Circle, FNP  metFORMIN (GLUCOPHAGE-XR) 500 MG 24 hr tablet TAKE TWO TABLETS BY MOUTH TWICE DAILY WITH A MEAL 04/13/16   Renato Shin, MD  omeprazole (PRILOSEC) 40 MG capsule Take 1 capsule (40 mg total) by mouth daily. 10/27/16   Marin Olp, MD  ondansetron (ZOFRAN ODT) 4 MG disintegrating tablet Take 1 tablet (4 mg total) by mouth every 4 (four) hours as needed for nausea or vomiting. 12/02/16   Charlesetta Shanks, MD  [facility] DELICA LANCETS 82U MISC Use to check blood sugar 1 time per day. DX Code: E11.9 08/02/15   Renato Shin, MD  oxyCODONE-acetaminophen (PERCOCET) 5-325 MG tablet Take 1-2 tablets by mouth every 4 (four) hours as needed. 12/02/16   Charlesetta Shanks, MD  pantoprazole (PROTONIX) 20 MG tablet Take 1 tablet (20 mg total) by mouth daily. 12/02/16   Charlesetta Shanks, MD  pioglitazone (ACTOS) 45 MG tablet Take 1  tablet (45 mg total) by mouth daily. Patient not taking: Reported on 12/02/2016 02/24/16   Renato Shin, MD  tamsulosin (FLOMAX) 0.4 MG CAPS capsule Take 1 capsule (0.4 mg total) by mouth daily. 12/02/16   Charlesetta Shanks, MD    Family History Family History  Problem Relation Age of Onset  . Diabetes Father   . Stroke Father   . Hypertension Father   . Hypertension Mother   . Diabetes Sister   . Diabetes Son   . Diabetes Daughter     Social History Social History  Substance Use Topics  . Smoking status: Never Smoker  . Smokeless tobacco: Never Used  . Alcohol use 0.6 - 1.2 oz/week    1 - 2 Glasses of wine per week     Comment: socially      Allergies   Patient has no known allergies.   Review of Systems Review of Systems  Constitutional: Negative for fever.  HENT: Negative for sore throat.   Eyes: Negative for redness.  Respiratory: Negative for shortness of breath.   Cardiovascular: Negative for chest pain.  Gastrointestinal: Negative for abdominal pain.  Endocrine: Negative for polyuria.  Genitourinary: Positive for flank pain.  Musculoskeletal: Negative for neck pain.  Skin: Negative for rash.  Neurological: Negative for headaches.  Hematological: Does not bruise/bleed easily.  Psychiatric/Behavioral: Negative for confusion.     Physical Exam Updated Vital Signs BP (!) 162/92   Pulse 96   Temp 98.6 F (37 C) (Oral)   Resp 18   SpO2 98%   Physical Exam  Constitutional: He is oriented to person, place, and time. He appears well-developed and well-nourished. No distress.  HENT:  Mouth/Throat: Oropharynx is clear and moist.  Eyes: Conjunctivae are normal.  Neck: Neck supple. No tracheal deviation present.  Cardiovascular: Normal rate, regular rhythm, normal heart sounds and intact distal pulses.   Pulmonary/Chest: Effort normal and breath sounds normal. No accessory muscle usage. No respiratory distress.  Abdominal: Soft. Bowel sounds are normal. He exhibits no distension and no mass. There is no tenderness. There is no guarding.  Genitourinary:  Genitourinary Comments: No cva tenderness  Musculoskeletal: He exhibits no edema.  Neurological: He is alert and oriented to person, place, and time.  Skin: Skin is warm and dry. He is not diaphoretic.  Psychiatric: He has a normal mood and affect.  Nursing note and vitals reviewed.    ED Treatments / Results  Labs (all labs ordered are listed, but only abnormal results are displayed) Results for orders placed or performed during the hospital encounter of 12/02/16  Comprehensive metabolic panel  Result Value Ref Range   Sodium 135 135 - 145 mmol/L    Potassium 4.3 3.5 - 5.1 mmol/L   Chloride 102 101 - 111 mmol/L   CO2 23 22 - 32 mmol/L   Glucose, Bld 190 (H) 65 - 99 mg/dL   BUN 21 (H) 6 - 20 mg/dL   Creatinine, Ser 1.25 (H) 0.61 - 1.24 mg/dL   Calcium 9.3 8.9 - 10.3 mg/dL   Total Protein 7.6 6.5 - 8.1 g/dL   Albumin 4.2 3.5 - 5.0 g/dL   AST 24 15 - 41 U/L   ALT 18 17 - 63 U/L   Alkaline Phosphatase 59 38 - 126 U/L   Total Bilirubin 0.7 0.3 - 1.2 mg/dL   GFR calc non Af Amer >60 >60 mL/min   GFR calc Af Amer >60 >60 mL/min   Anion gap 10 5 - 15  Lipase, blood  Result Value Ref Range   Lipase 36 11 - 51 U/L  CBC WITH DIFFERENTIAL  Result Value Ref Range   WBC 9.2 4.0 - 10.5 K/uL   RBC 4.99 4.22 - 5.81 MIL/uL   Hemoglobin 12.6 (L) 13.0 - 17.0 g/dL   HCT 38.9 (L) 39.0 - 52.0 %   MCV 78.0 78.0 - 100.0 fL   MCH 25.3 (L) 26.0 - 34.0 pg   MCHC 32.4 30.0 - 36.0 g/dL   RDW 14.6 11.5 - 15.5 %   Platelets 266 150 - 400 K/uL   Neutrophils Relative % 81 %   Neutro Abs 7.4 1.7 - 7.7 K/uL   Lymphocytes Relative 12 %   Lymphs Abs 1.1 0.7 - 4.0 K/uL   Monocytes Relative 6 %   Monocytes Absolute 0.5 0.1 - 1.0 K/uL   Eosinophils Relative 1 %   Eosinophils Absolute 0.1 0.0 - 0.7 K/uL   Basophils Relative 0 %   Basophils Absolute 0.0 0.0 - 0.1 K/uL  Urinalysis, Complete w Microscopic  Result Value Ref Range   Color, Urine STRAW (A) YELLOW   APPearance CLEAR CLEAR   Specific Gravity, Urine 1.027 1.005 - 1.030   pH 6.0 5.0 - 8.0   Glucose, UA NEGATIVE NEGATIVE mg/dL   Hgb urine dipstick NEGATIVE NEGATIVE   Bilirubin Urine NEGATIVE NEGATIVE   Ketones, ur NEGATIVE NEGATIVE mg/dL   Protein, ur NEGATIVE NEGATIVE mg/dL   Nitrite NEGATIVE NEGATIVE   Leukocytes, UA NEGATIVE NEGATIVE   RBC / HPF 0-5 0 - 5 RBC/hpf   WBC, UA NONE SEEN 0 - 5 WBC/hpf   Bacteria, UA NONE SEEN NONE SEEN   Squamous Epithelial / LPF 0-5 (A) NONE SEEN  POC occult blood, ED  Result Value Ref Range   Fecal Occult Bld NEGATIVE NEGATIVE   Ct Abdomen Pelvis W  Contrast  Result Date: 12/02/2016 CLINICAL DATA:  Left abdominal pain, nausea EXAM: CT ABDOMEN AND PELVIS WITH CONTRAST TECHNIQUE: Multidetector CT imaging of the abdomen and pelvis was performed using the standard protocol following bolus administration of intravenous contrast. CONTRAST:  129m ISOVUE-300 IOPAMIDOL (ISOVUE-300) INJECTION 61% COMPARISON:  None. FINDINGS: Lower chest: Mild dependent atelectasis in the bilateral lower lobes. Hepatobiliary: Liver is within normal limits. Gallbladder is unremarkable. No intrahepatic or extrahepatic duct dilatation. Pancreas: Within normal limits. Spleen: Within normal limits. Adrenals/Urinary Tract: Adrenal glands are within normal limits. Right kidney is within normal limits. 4 mm nonobstructing left lower pole renal calculus (series 3/ image 40). Mild diminished/delayed enhancement of the left kidney with nonspecific perinephric stranding. Mild left hydroureteronephrosis. Associated 6 mm distal left ureteral calculus at the UVJ (series 3/ image 84). Bladder is within normal limits. Stomach/Bowel: Stomach is within normal limits. No evidence of bowel obstruction. Normal appendix (coronal image 67). Vascular/Lymphatic: No evidence of abdominal aortic aneurysm. Mild atherosclerotic calcifications of the bilateral iliac arteries. No suspicious abdominopelvic lymphadenopathy. Reproductive: Prostate is unremarkable. Other: No abdominopelvic ascites. Small fat containing left inguinal hernia. Postsurgical changes related to prior right inguinal hernia repair. Musculoskeletal: Visualized osseous structures are within normal limits. IMPRESSION: 6 mm distal left ureteral calculus at the UVJ. Associated mild left hydroureteronephrosis. Additional 4 mm nonobstructing left lower pole renal calculus. Electronically Signed   By: SJulian HyM.D.   On: 12/02/2016 12:02   [place]  Result Date: 12/08/2016 CLINICAL DATA:  Left hydronephrosis, obstructing left UVJ calculus  EXAM: RENAL / URINARY TRACT ULTRASOUND COMPLETE COMPARISON:  12/02/2016 FINDINGS: Right Kidney: Length: 11.2  cm. Normal echogenicity. No hydronephrosis. Incidental tiny anechoic midpole cyst measures 8 mm. Left Kidney: Length: 11.3 cm. Normal echogenicity. Very mild hydronephrosis. Echogenic shadowing intrarenal calculus in the mid to lower pole measures 8 mm. Degree of hydronephrosis is likely unchanged. Bladder: Right ureteral jet demonstrated. Prostate gland is enlarged measuring 5.7 cm. 9 mm shadowing calculus at the left bladder base. IMPRESSION: Stable very mild left hydronephrosis. Suspect persistent left distal UVJ calculus along the bladder base. Electronically Signed   By: Jerilynn Mages.  Shick M.D.   On: 12/08/2016 14:27    EKG  EKG Interpretation None       Radiology No results found.  Procedures Procedures (including critical care time)  Medications Ordered in ED Medications  0.9 %  sodium chloride infusion (not administered)  HYDROmorphone (DILAUDID) injection 1 mg (not administered)  ketorolac (TORADOL) 30 MG/ML injection 30 mg (not administered)  ondansetron (ZOFRAN) injection 4 mg (not administered)     Initial Impression / Assessment and Plan / ED Course  I have reviewed the triage vital signs and the nursing notes.  Pertinent labs & imaging results that were available during my care of the patient were reviewed by me and considered in my medical decision making (see chart for details).  Iv ns bolus. toradol iv. Dilaudid iv. zofran iv.  Reviewed nursing notes and prior charts for additional history.   Recheck pain improved.  Discussed with urology, Dr Junious Silk, 6 days left ureteral colic and 6 mm stone - he indicates have pt call office Monday.     Final Clinical Impressions(s) / ED Diagnoses   Final diagnoses:  None    New Prescriptions New Prescriptions   No medications on file     Lajean Saver, MD 12/08/16 1521

## 2016-12-08 NOTE — ED Notes (Signed)
Patient asked again for urine sample. He states that he is unable to give sample at this time.

## 2016-12-10 ENCOUNTER — Other Ambulatory Visit: Payer: Self-pay | Admitting: Urology

## 2016-12-10 ENCOUNTER — Ambulatory Visit (HOSPITAL_COMMUNITY): Payer: Self-pay | Admitting: Certified Registered Nurse Anesthetist

## 2016-12-10 ENCOUNTER — Encounter (HOSPITAL_COMMUNITY): Payer: Self-pay | Admitting: *Deleted

## 2016-12-10 ENCOUNTER — Encounter (HOSPITAL_COMMUNITY)
Admission: AD | Disposition: A | Discharge status: Home / Self Care | Payer: Self-pay | Source: Ambulatory Visit | Attending: Urology

## 2016-12-10 ENCOUNTER — Ambulatory Visit (HOSPITAL_COMMUNITY)
Admission: AD | Admit: 2016-12-10 | Discharge: 2016-12-10 | Disposition: A | Discharge status: Home / Self Care | Payer: Self-pay | Source: Ambulatory Visit | Attending: Urology | Admitting: Urology

## 2016-12-10 DIAGNOSIS — E119 Type 2 diabetes mellitus without complications: Secondary | ICD-10-CM | POA: Insufficient documentation

## 2016-12-10 DIAGNOSIS — Z79899 Other long term (current) drug therapy: Secondary | ICD-10-CM | POA: Insufficient documentation

## 2016-12-10 DIAGNOSIS — N201 Calculus of ureter: Secondary | ICD-10-CM

## 2016-12-10 DIAGNOSIS — I10 Essential (primary) hypertension: Secondary | ICD-10-CM | POA: Insufficient documentation

## 2016-12-10 DIAGNOSIS — E7800 Pure hypercholesterolemia, unspecified: Secondary | ICD-10-CM | POA: Insufficient documentation

## 2016-12-10 DIAGNOSIS — Z7982 Long term (current) use of aspirin: Secondary | ICD-10-CM | POA: Insufficient documentation

## 2016-12-10 DIAGNOSIS — N132 Hydronephrosis with renal and ureteral calculous obstruction: Secondary | ICD-10-CM | POA: Insufficient documentation

## 2016-12-10 DIAGNOSIS — Z7984 Long term (current) use of oral hypoglycemic drugs: Secondary | ICD-10-CM | POA: Insufficient documentation

## 2016-12-10 HISTORY — PX: CYSTOSCOPY: SHX5120

## 2016-12-10 HISTORY — DX: Personal history of urinary calculi: Z87.442

## 2016-12-10 LAB — GLUCOSE, CAPILLARY
Glucose-Capillary: 198 mg/dL — ABNORMAL HIGH (ref 65–99)
Glucose-Capillary: 148 mg/dL — ABNORMAL HIGH (ref 65–99)

## 2016-12-10 SURGERY — CYSTOSCOPY
Anesthesia: General | Laterality: Left

## 2016-12-10 MED ORDER — MIDAZOLAM HCL 5 MG/5ML IJ SOLN
INTRAMUSCULAR | Status: DC | PRN
  Administered 2016-12-10: 2 mg via INTRAVENOUS

## 2016-12-10 MED ORDER — LIDOCAINE HCL (CARDIAC) 20 MG/ML IV SOLN
INTRAVENOUS | Status: DC | PRN
  Administered 2016-12-10: 80 mg via INTRAVENOUS

## 2016-12-10 MED ORDER — DEXAMETHASONE SODIUM PHOSPHATE 10 MG/ML IJ SOLN
INTRAMUSCULAR | Status: AC
  Filled 2016-12-10: qty 1

## 2016-12-10 MED ORDER — METOCLOPRAMIDE HCL 5 MG/ML IJ SOLN: 10.0000 mg | Freq: Once | INTRAMUSCULAR | Status: DC | PRN

## 2016-12-10 MED ORDER — CEFAZOLIN SODIUM-DEXTROSE 2-4 GM/100ML-% IV SOLN
2.0000 g | INTRAVENOUS | Status: AC
  Administered 2016-12-10: 2 g via INTRAVENOUS
  Filled 2016-12-10: qty 100

## 2016-12-10 MED ORDER — PROPOFOL 10 MG/ML IV BOLUS
INTRAVENOUS | Status: AC
  Filled 2016-12-10: qty 20

## 2016-12-10 MED ORDER — FENTANYL CITRATE (PF) 100 MCG/2ML IJ SOLN
INTRAMUSCULAR | Status: AC
  Filled 2016-12-10: qty 2

## 2016-12-10 MED ORDER — ONDANSETRON HCL 4 MG/2ML IJ SOLN
INTRAMUSCULAR | Status: AC
  Filled 2016-12-10: qty 2

## 2016-12-10 MED ORDER — FENTANYL CITRATE (PF) 100 MCG/2ML IJ SOLN: 25.0000 ug | INTRAMUSCULAR | Status: DC | PRN

## 2016-12-10 MED ORDER — FENTANYL CITRATE (PF) 100 MCG/2ML IJ SOLN
INTRAMUSCULAR | Status: DC | PRN
  Administered 2016-12-10 (×2): 50 ug via INTRAVENOUS

## 2016-12-10 MED ORDER — LACTATED RINGERS IV SOLN: INTRAVENOUS | Status: DC

## 2016-12-10 MED ORDER — LACTATED RINGERS IV SOLN
INTRAVENOUS | Status: DC
  Administered 2016-12-10 (×2): via INTRAVENOUS

## 2016-12-10 MED ORDER — MEPERIDINE HCL 50 MG/ML IJ SOLN: 6.2500 mg | INTRAMUSCULAR | Status: DC | PRN

## 2016-12-10 MED ORDER — PROPOFOL 10 MG/ML IV BOLUS
INTRAVENOUS | Status: DC | PRN
  Administered 2016-12-10: 200 mg via INTRAVENOUS

## 2016-12-10 MED ORDER — ONDANSETRON HCL 4 MG/2ML IJ SOLN
INTRAMUSCULAR | Status: DC | PRN
  Administered 2016-12-10: 4 mg via INTRAVENOUS

## 2016-12-10 MED ORDER — MIDAZOLAM HCL 2 MG/2ML IJ SOLN
INTRAMUSCULAR | Status: AC
  Filled 2016-12-10: qty 2

## 2016-12-10 MED ORDER — LIDOCAINE 2% (20 MG/ML) 5 ML SYRINGE
INTRAMUSCULAR | Status: AC
  Filled 2016-12-10: qty 5

## 2016-12-10 MED ORDER — SODIUM CHLORIDE 0.9 % IR SOLN
Status: DC | PRN
  Administered 2016-12-10: 3000 mL

## 2016-12-10 MED ORDER — DEXAMETHASONE SODIUM PHOSPHATE 4 MG/ML IJ SOLN
INTRAMUSCULAR | Status: DC | PRN
  Administered 2016-12-10: 10 mg via INTRAVENOUS

## 2016-12-10 SURGICAL SUPPLY — 11 items
BAG URO CATCHER STRL LF (MISCELLANEOUS) ×2 IMPLANT
CATH INTERMIT  6FR 70CM (CATHETERS) ×2 IMPLANT
CLOTH BEACON ORANGE TIMEOUT ST (SAFETY) ×2 IMPLANT
COVER SURGICAL LIGHT HANDLE (MISCELLANEOUS) ×2 IMPLANT
GLOVE BIOGEL M 8.0 STRL (GLOVE) ×2 IMPLANT
GOWN STRL REUS W/ TWL XL LVL3 (GOWN DISPOSABLE) ×1 IMPLANT
GOWN STRL REUS W/TWL XL LVL3 (GOWN DISPOSABLE) ×1
GUIDEWIRE STR DUAL SENSOR (WIRE) ×2 IMPLANT
MANIFOLD NEPTUNE II (INSTRUMENTS) ×2 IMPLANT
PACK CYSTO (CUSTOM PROCEDURE TRAY) ×2 IMPLANT
TUBING CONNECTING 10 (TUBING) ×2 IMPLANT

## 2016-12-10 NOTE — Anesthesia Procedure Notes (Signed)
Procedure Name: LMA Insertion Date/Time: 12/10/2016 6:11 PM Performed by: Claudia Desanctis Pre-anesthesia Checklist: Emergency Drugs available, Patient identified, Suction available and Patient being monitored Patient Re-evaluated:Patient Re-evaluated prior to induction Oxygen Delivery Method: Circle system utilized Preoxygenation: Pre-oxygenation with 100% oxygen Induction Type: IV induction Ventilation: Mask ventilation without difficulty LMA: LMA inserted LMA Size: 4.0 Number of attempts: 1 Placement Confirmation: positive ETCO2 and breath sounds checked- equal and bilateral Tube secured with: Tape Dental Injury: Teeth and Oropharynx as per pre-operative assessment

## 2016-12-10 NOTE — Anesthesia Postprocedure Evaluation (Signed)
Anesthesia Post Note  Patient: Environmental manager  Procedure(s) Performed: Procedure(s) (LRB): cystoscopy with left ureteral stone extraction  (Left)     Patient location during evaluation: PACU Anesthesia Type: General Level of consciousness: awake and alert Pain management: pain level controlled Vital Signs Assessment: post-procedure vital signs reviewed and stable Respiratory status: spontaneous breathing, nonlabored ventilation, respiratory function stable and patient connected to nasal cannula oxygen Cardiovascular status: blood pressure returned to baseline and stable Postop Assessment: no signs of nausea or vomiting Anesthetic complications: no    Last Vitals:  Vitals:   12/10/16 1546 12/10/16 1845  BP: (!) 147/85 (!) 143/77  Pulse: 87 64  Resp: 18 10  Temp: 36.9 C 36.5 C    Last Pain:  Vitals:   12/10/16 1845  TempSrc:   PainSc: Asleep                 Montez Hageman

## 2016-12-10 NOTE — H&P (Signed)
HPI: Jon [name] is a 61 year-old male patient who was referred by Dr. Lajean Saver, MD who is here for a ureteral calculus.  The patient's stone is on his left side. He first noticed the symptoms 12/02/2016. This is not his first kidney stone. He is currently having flank pain, nausea, and vomiting. He denies having back pain, groin pain, fever, and chills. He has not caught a stone in his urine strainer since his symptoms began.   He has never had surgical treatment for calculi in the past.   12/10/16: The patient presented to the emergency room on 12/02/16 with left flank pain that he described as constant, dull and waxing and waning. It was not relieved by positional change. A CT scan at that time revealed what the radiologist described as a 6 mm left distal ureteral stone although Was the description of the length of the stone which was actually 4.8 mm in width. It was located at the level of the ureteral orifice on the left hand side with some left hydronephrosis. A 4 mm stone was also noted in the lower pole of the left kidney. The patient returned to the emergency room on 12/08/16 with continued intermittent pain. A renal ultrasound revealed a left renal cyst and persistent hydronephrosis. He is on tamsulosin.   Interval: The patient has not had any pain relief since Sunday. He is unable to work. He is not having any improvement in his pain with the pain medications provided. He is "miserable". He has a lot of voiding symptoms. He denies the passage of stone. He denies any gross hematuria and denies any significant fevers or chills.     ALLERGIES: None   MEDICATIONS: Aspirin  Lisinopril 40 mg tablet  Metformin Hcl 500 mg tablet  Atorvastatin Calcium 40 mg tablet  Glimepiride 2 mg tablet     GU PSH: None     PSH Notes: Stomach ulcer surgery (1984)   NON-GU PSH: Hernia Repair, Inguinal Hernia (1994)    GU PMH: None   NON-GU PMH: Acute gastric ulcer with hemorrhage Diabetes Type  2 Gout Hypercholesterolemia Hypertension    FAMILY HISTORY: 1 Daughter - Daughter 3 Son's - Son Diabetes - Father Hypertension - Mother, Father nephrolithiasis - Mother   SOCIAL HISTORY: Marital Status: Divorced Preferred Language: English; Ethnicity: Not Hispanic Or Latino; Race: White, Black or African American Does drink.  Drinks 1 caffeinated drink per day. Patient's occupation Community education officer.    REVIEW OF SYSTEMS:    GU Review Male:   Patient reports frequent urination and get up at night to urinate. Patient denies hard to postpone urination, burning/ pain with urination, leakage of urine, stream starts and stops, trouble starting your stream, have to strain to urinate , erection problems, and penile pain.  Gastrointestinal (Upper):   Patient reports nausea, vomiting, and indigestion/ heartburn.   Gastrointestinal (Lower):   Patient denies diarrhea and constipation.  Constitutional:   Patient denies fever, night sweats, weight loss, and fatigue.  Skin:   Patient denies skin rash/ lesion and itching.  Eyes:   Patient denies blurred vision and double vision.  Ears/ Nose/ Throat:   Patient denies sinus problems and sore throat.  Hematologic/Lymphatic:   Patient denies swollen glands and easy bruising.  Cardiovascular:   Patient denies leg swelling and chest pains.  Respiratory:   Patient denies cough and shortness of breath.  Endocrine:   Patient denies excessive thirst.  Musculoskeletal:   Patient denies back pain and joint pain.  Neurological:   Patient denies headaches and dizziness.  Psychologic:   Patient denies depression and anxiety.   VITAL SIGNS:      12/10/2016 02:02 PM  Weight 198 lb / 89.81 kg  Height 70 in / 177.8 cm  BP 155/90 mmHg  Pulse 95 /min  Temperature 99.0 F / 37.2 C  BMI 28.4 kg/m   MULTI-SYSTEM PHYSICAL EXAMINATION:    Constitutional: Well-nourished. No physical deformities. Normally developed. Good grooming.  Neck: Neck symmetrical, not  swollen. Normal tracheal position.  Respiratory: No labored breathing, no use of accessory muscles. Regular rate and rhythm  Cardiovascular: Normal temperature, normal extremity pulses, no swelling, no varicosities. Clear to auscultation bilaterally  Lymphatic: No enlargement of neck, axillae, groin.  Skin: No paleness, no jaundice, no cyanosis. No lesion, no ulcer, no rash.  Neurologic / Psychiatric: Oriented to time, oriented to place, oriented to person. No depression, no anxiety, no agitation.  Gastrointestinal: No mass, no tenderness, no rigidity, non obese abdomen.  Eyes: Normal conjunctivae. Normal eyelids.  Ears, Nose, Mouth, and Throat: Left ear no scars, no lesions, no masses. Right ear no scars, no lesions, no masses. Nose no scars, no lesions, no masses. Normal hearing. Normal lips.  Musculoskeletal: Normal gait and station of head and neck.     PAST DATA REVIEWED:  Source Of History:  Patient  Lab Test Review:   BUN/Creatinine, Calcium  Records Review:   Previous Hospital Records, POC Tool  X-Ray Review: Renal Ultrasound: Reviewed Report. Ultrasound as above C.T. Stone Protocol: Reviewed Films. Reviewed Report. Discussed With Patient. CT scan as above   Notes:                     On 12/08/16 his creatinine was 1.25 with a normal serum calcium of 9.3.   PROCEDURES:          Urinalysis Dipstick Dipstick Cont'd  Color: Yellow Bilirubin: Neg  Appearance: Clear Ketones: Neg  Specific Gravity: 1.020 Blood: Neg  pH: 7.0 Protein: Trace  Glucose: Neg Urobilinogen: 0.2    Nitrites: Neg    Leukocyte Esterase: Neg         Ketoralac 60mg  - [id number], [id number] Qty: 60 Adm. By: Cristobal Goldmann  Unit: mg Lot No [id number]  Route: IM Exp. Date 06/14/2018  Freq: None Mfgr.:   Site: Right Buttock   ASSESSMENT:      ICD-10 Details  1 GU:   Ureteral calculus - N20.1 We discussed the management of urinary stones. These options include observation, ureteroscopy, shockwave lithotripsy, and PCNL. We  discussed which options are relevant to these particular stones. We discussed the natural history of stones as well as the complications of untreated stones and the impact on quality of life without treatment as well as with each of the above listed treatments. We also discussed the efficacy of each treatment in its ability to clear the stone burden. With any of these management options I discussed the signs and symptoms of infection and the need for emergent treatment should these be experienced. For each option we discussed the ability of each procedure to clear the patient of their stone burden. For observation I described the risks which include but are not limited to silent renal damage, life-threatening infection, need for emergent surgery, failure to pass stone, and pain. For ureteroscopy I described the risks which include heart attack, stroke, pulmonary embolus, death, bleeding, infection, damage to contiguous structures, positioning injury, ureteral stricture, ureteral avulsion, ureteral injury, need for ureteral  stent, inability to perform ureteroscopy, need for an interval procedure, inability to clear stone burden, stent discomfort and pain. For shockwave lithotripsy I described the risks which include arrhythmia, kidney contusion, kidney hemorrhage, need for transfusion, long-term risk of diabetes or hypertension, back discomfort, flank ecchymosis, flank abrasion, inability to break up stone, inability to pass stone fragments, Steinstrasse, infection associated with obstructing stones, need for different surgical procedure and possible need for repeat shockwave lithotripsy.   2   Ureteral obstruction secondary to calculous - N13.2   3   Renal cyst - N28.1 Left, The simple cyst in the left kidney is of no clinical significance.   PLAN:           Document Letter(s):  Created for Patient: Clinical Summary    I went over the treatment options for their stone. We discussed ongoing medical  expulsion therapy, ESWL and ureteroscopy. Ultimately the patient and I agreed that ureterscopy is the best option. I went over this surgery with the patient in detail. The patient understands after being put to sleep, we would proceed with a telescope to access the stone and potentially use a laser to fragment the stone before removing it with a basket. After removing the stone the patient will require temporary stent placement in the ureter. This is an outpatient procedure. I also discussed the potential of not being able to gain access safely into the ureter/kidney. This would require that a stent be placed and then the patient rescheduled several weeks later for a second attempt. They also understand the small risks of ureteral trauma causing a stricture or permanent damage. I also explained the risk of urinary tract infection. Having gone over the procedure itself, the expected outcome, and the risks/benefits the patient has agreed to proceed.

## 2016-12-10 NOTE — Discharge Instructions (Signed)

## 2016-12-10 NOTE — Op Note (Signed)
PATIENT:  Jon [name]  PRE-OPERATIVE DIAGNOSIS: Left ureterovesical junction stone   POST-OPERATIVE DIAGNOSIS: Same  PROCEDURE: Cystoscopy with ureteral stone extraction  SURGEON:  Claybon Jabs  INDICATION: Bretton Tandy is a 60 year old male with a 5 mm left distal ureteral stone that is actually seen to likely be protruding from the ureteral orifice by CT scan. He continues to have pain and has been unable to pass the stone. He is therefore taken to the OR for stone extraction.  ANESTHESIA:  General  EBL:  Minimal  DRAINS: None  LOCAL MEDICATIONS USED:  None  SPECIMEN:  Stone given to patient  Description of procedure: After informed consent the patient was taken to the operating room and placed on the table in a supine position. General anesthesia was then administered. Once fully anesthetized the patient was moved to the dorsal lithotomy position and the genitalia were sterilely prepped and draped in standard fashion. An official timeout was then performed.  The 23 French rigid cystoscope with 30 lens was passed down the urethra under direct vision which was noted to be entirely normal. The prostatic urethra revealed some slight elongation but there were no lesions and it was nonobstructing. The bladder was entered and noted to have no tumors or inflammatory lesions. The right ureteral orifice was of normal configuration and position the left ureteral orifice had a stone protruding from the orifice.  I passed the alligator forceps through the cystoscope and was able to grasp the protruding portion of the stone and gently manipulated the stone until it worked its way out of ureteral orifice. The orifice had been dilated from the stone and therefore I did not feel there is a need for stent. I therefore drained his bladder, removed the cystoscope and the patient was awakened and taken to the recovery room in stable and satisfactory condition. He tolerated procedure well no  intraoperative complications.   PLAN OF CARE: Discharge to home after PACU  PATIENT DISPOSITION:  PACU - hemodynamically stable.

## 2016-12-10 NOTE — Transfer of Care (Signed)
Immediate Anesthesia Transfer of Care Note  Patient: Jon [name]  Procedure(s) Performed: Procedure(s): cystoscopy with left ureteral stone extraction  (Left)  Patient Location: PACU  Anesthesia Type:General  Level of Consciousness:  sedated, patient cooperative and responds to stimulation  Airway & Oxygen Therapy:Patient Spontanous Breathing and Patient connected to face mask oxgen  Post-op Assessment:  Report given to PACU RN and Post -op Vital signs reviewed and stable  Post vital signs:  Reviewed and stable  Last Vitals:  Vitals:   12/10/16 1546  BP: (!) 147/85  Pulse: 87  Resp: 18  Temp: 40.3 C    Complications: No apparent anesthesia complications

## 2016-12-10 NOTE — Anesthesia Preprocedure Evaluation (Signed)
Anesthesia Evaluation  Patient identified by MRN, date of birth, ID band Patient awake    Reviewed: Allergy & Precautions, NPO status , Patient's Chart, lab work & pertinent test results  Airway Mallampati: II  TM Distance: >3 FB Neck ROM: Full    Dental no notable dental hx.    Pulmonary neg pulmonary ROS,    Pulmonary exam normal breath sounds clear to auscultation       Cardiovascular hypertension, Pt. on medications Normal cardiovascular exam Rhythm:Regular Rate:Normal     Neuro/Psych negative neurological ROS  negative psych ROS   GI/Hepatic Neg liver ROS, PUD,   Endo/Other  diabetes, Type 2, Oral Hypoglycemic Agents  Renal/GU negative Renal ROS  negative genitourinary   Musculoskeletal negative musculoskeletal ROS (+)   Abdominal   Peds negative pediatric ROS (+)  Hematology negative hematology ROS (+)   Anesthesia Other Findings   Reproductive/Obstetrics negative OB ROS                             Anesthesia Physical Anesthesia Plan  ASA: II  Anesthesia Plan: General   Post-op Pain Management:    Induction: Intravenous  PONV Risk Score and Plan: 3 and Ondansetron, Dexamethasone and Midazolam  Airway Management Planned: LMA  Additional Equipment:   Intra-op Plan:   Post-operative Plan: Extubation in OR  Informed Consent: I have reviewed the patients History and Physical, chart, labs and discussed the procedure including the risks, benefits and alternatives for the proposed anesthesia with the patient or authorized representative who has indicated his/her understanding and acceptance.   Dental advisory given  Plan Discussed with: CRNA  Anesthesia Plan Comments:         Anesthesia Quick Evaluation

## 2016-12-11 ENCOUNTER — Encounter (HOSPITAL_COMMUNITY): Payer: Self-pay | Admitting: Urology

## 2016-12-19 ENCOUNTER — Other Ambulatory Visit: Payer: Self-pay | Admitting: Endocrinology

## 2017-01-01 ENCOUNTER — Other Ambulatory Visit: Payer: Self-pay | Admitting: Family

## 2017-01-31 ENCOUNTER — Other Ambulatory Visit: Payer: Self-pay | Admitting: Family

## 2017-02-14 ENCOUNTER — Telehealth: Payer: Self-pay | Admitting: *Deleted

## 2017-02-14 MED ORDER — AMLODIPINE BESYLATE 10 MG PO TABS: 10.0000 mg | ORAL_TABLET | Freq: Every day | ORAL | 0 refills | Status: DC

## 2017-02-14 NOTE — Telephone Encounter (Signed)
Left msg on triage requesting refill on his amlodipine. Pt is due for f/u appt will send 30 day until he f/u w/provider...Johny Chess

## 2017-02-20 ENCOUNTER — Ambulatory Visit: Payer: Self-pay | Admitting: Family Medicine

## 2017-03-01 ENCOUNTER — Encounter: Payer: Self-pay | Admitting: Family Medicine

## 2017-03-01 ENCOUNTER — Ambulatory Visit (INDEPENDENT_AMBULATORY_CARE_PROVIDER_SITE_OTHER): Payer: Self-pay | Admitting: Family Medicine

## 2017-03-01 VITALS — BP 130/80 | Ht 70.0 in | Wt 195.0 lb

## 2017-03-01 DIAGNOSIS — M6530 Trigger finger, unspecified finger: Secondary | ICD-10-CM

## 2017-03-01 MED ORDER — METHYLPREDNISOLONE ACETATE 40 MG/ML IJ SUSP
40.0000 mg | Freq: Once | INTRAMUSCULAR | Status: AC
  Administered 2017-03-01: 40 mg via INTRA_ARTICULAR

## 2017-03-03 ENCOUNTER — Other Ambulatory Visit: Payer: Self-pay | Admitting: Family

## 2017-03-03 ENCOUNTER — Other Ambulatory Visit: Payer: Self-pay | Admitting: Endocrinology

## 2017-03-03 NOTE — Progress Notes (Signed)
  Jon [name] - 61 y.o. male MRN [id number]  Date of birth: [birth year]    SUBJECTIVE:      Chief Complaint:/ HPI:   Recurrence of right ring finger sticking in flexion. Had same about a year ago and injection resolved symptoms until last 2 months when they returned with slight sticking at times progressing to constant sticking. Painful when stuck. No swelling. Right hand dominant. No unusual activities. No trauma. Not tried any treatment other than rest.  ROS:     See HPI. Additionally no fever, no rash on hand.  PERTINENT  PMH / PSH FH / / SH:  Past Medical, Surgical, Social, and Family History Reviewed & Updated in the EMR.  Pertinent findings include:  DM Previous episode of same finger ---trigger finger with CSI 02/2016  OBJECTIVE: BP 130/80   Ht 5\' 10"  (1.778 m)   Wt 195 lb (88.5 kg)   BMI 27.98 kg/m   Physical Exam:  Vital signs are reviewed. GEN WD WN NAD HANDS: symmetrical. No thenar atrophy. Normal muscle strength and normal ROM all fingers except right ring finger has tender nodule at A1 pulley and it sticks in flexion. VASC: radial pulses @+B= SKIN of rigt hand without rash or unusual lesions NEURO: intact sensation to soft touch B and 2 point discrimination intact B.  PROCEDURE: INJECTION: Patient was given informed consent, signed copy in the chart. Appropriate time out was taken. Area prepped and draped in usual sterile fashion. Ethyl chloride was  used for local anesthesia. A 21 gauge 1 1/2 inch needle was used.. One halc cc of a solution made if 1 cc of methylprednisolone 40 mg/ml plus  1 cc of 1% lidocaine without epinephrine was injected into the area around and the tendon sheath of the right ring finger A1 pulley nodue  using a(n) volar approach.   The patient tolerated the procedure well. There were no complications. Post procedure instructions were given.   ASSESSMENT & PLAN:  See problem based charting & AVS for pt instructions.

## 2017-03-03 NOTE — Assessment & Plan Note (Signed)
He had almost a year of resolution of symptoms so we did CSI today. I did talk about recurrence and surgical intervention with him.Hopefully he will get resolution again.

## 2017-03-03 NOTE — Telephone Encounter (Signed)
Please refill x 1 Ov is due  

## 2017-03-06 ENCOUNTER — Other Ambulatory Visit: Payer: Self-pay

## 2017-03-06 MED ORDER — GLIMEPIRIDE 4 MG PO TABS: 4.0000 mg | ORAL_TABLET | Freq: Every day | ORAL | 11 refills | Status: DC

## 2017-03-08 ENCOUNTER — Telehealth: Payer: Self-pay | Admitting: Family

## 2017-03-08 MED ORDER — LISINOPRIL 40 MG PO TABS: 40.0000 mg | ORAL_TABLET | Freq: Every day | ORAL | 0 refills | Status: DC

## 2017-03-08 NOTE — Telephone Encounter (Signed)
Patient is requesting refill on lisinopril to be sent to [facility] on [address].  Has appt to transfer to [place] on 10/31.

## 2017-03-08 NOTE — Telephone Encounter (Signed)
Per office policy sent 30 day to local pharmacy until appt.../lmb  

## 2017-03-13 ENCOUNTER — Encounter: Payer: Self-pay | Admitting: Internal Medicine

## 2017-03-13 ENCOUNTER — Ambulatory Visit (INDEPENDENT_AMBULATORY_CARE_PROVIDER_SITE_OTHER): Payer: Self-pay | Admitting: Internal Medicine

## 2017-03-13 ENCOUNTER — Other Ambulatory Visit: Payer: Self-pay | Admitting: Internal Medicine

## 2017-03-13 ENCOUNTER — Other Ambulatory Visit (INDEPENDENT_AMBULATORY_CARE_PROVIDER_SITE_OTHER): Payer: Self-pay

## 2017-03-13 VITALS — BP 148/90 | HR 93 | Temp 98.1°F | Ht 70.0 in | Wt 196.0 lb

## 2017-03-13 DIAGNOSIS — I10 Essential (primary) hypertension: Secondary | ICD-10-CM

## 2017-03-13 DIAGNOSIS — E119 Type 2 diabetes mellitus without complications: Secondary | ICD-10-CM

## 2017-03-13 DIAGNOSIS — E785 Hyperlipidemia, unspecified: Secondary | ICD-10-CM

## 2017-03-13 LAB — LIPID PANEL
CHOL/HDL RATIO: 4
Cholesterol: 138 mg/dL (ref 0–200)
HDL: 33.5 mg/dL — ABNORMAL LOW (ref >39.00)
LDL Cholesterol: 88 mg/dL (ref 0–99)
NONHDL: 104.06
Triglycerides: 79 mg/dL (ref 0.0–149.0)
VLDL: 15.8 mg/dL (ref 0.0–40.0)

## 2017-03-13 LAB — BASIC METABOLIC PANEL
BUN: 25 mg/dL — ABNORMAL HIGH (ref 6–23)
CHLORIDE: 104 meq/L (ref 96–112)
CO2: 29 mEq/L (ref 19–32)
CREATININE: 1.01 mg/dL (ref 0.40–1.50)
Calcium: 9.8 mg/dL (ref 8.4–10.5)
GFR: 95.9 mL/min (ref >60.00)
Glucose, Bld: 242 mg/dL — ABNORMAL HIGH (ref 70–99)
POTASSIUM: 4.3 meq/L (ref 3.5–5.1)
Sodium: 140 mEq/L (ref 135–145)

## 2017-03-13 LAB — HEPATIC FUNCTION PANEL
ALT: 13 U/L (ref 0–53)
AST: 10 U/L (ref 0–37)
Albumin: 4.3 g/dL (ref 3.5–5.2)
Alkaline Phosphatase: 60 U/L (ref 39–117)
BILIRUBIN DIRECT: 0.1 mg/dL (ref 0.0–0.3)
BILIRUBIN TOTAL: 0.5 mg/dL (ref 0.2–1.2)
Total Protein: 7.7 g/dL (ref 6.0–8.3)

## 2017-03-13 LAB — HEMOGLOBIN A1C: Hgb A1c MFr Bld: 9.8 % — ABNORMAL HIGH (ref 4.6–6.5)

## 2017-03-13 MED ORDER — PIOGLITAZONE HCL 30 MG PO TABS: 30.0000 mg | ORAL_TABLET | Freq: Every day | ORAL | 3 refills | Status: DC

## 2017-03-13 NOTE — Patient Instructions (Addendum)

## 2017-03-13 NOTE — Assessment & Plan Note (Signed)
stable overall by history and exam, recent data reviewed with pt, and pt to continue medical treatment as before,  to f/u any worsening symptoms or concerns  

## 2017-03-13 NOTE — Addendum Note (Signed)
Addended by: Biagio Borg on: 03/13/2017 09:29 AM   Modules accepted: Orders

## 2017-03-13 NOTE — Progress Notes (Signed)
Subjective:    Patient ID: Jon [name], male    DOB: Feb 14, 1956, 61 y.o.   MRN: [id number]  HPI   Here to f/u; overall doing ok,  Pt denies chest pain, increasing sob or doe, wheezing, orthopnea, PND, increased LE swelling, palpitations, dizziness or syncope.  Pt denies new neurological symptoms such as new headache, or facial or extremity weakness or numbness.  Pt denies polydipsia, polyuria, or low sugar episode.  Pt states overall good compliance with meds, mostly trying to follow appropriate diet, with wt overall stable,  but little exercise however.   Has had recently poorly controlled DM with last a1vc jan 2018 over 10, as well as very high LDL which he attributes to not taking medication at the time due to loss of job and insurance.  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   Wants to keep expenses as low as possible. Does not want to see Dr Loanne Drilling further, "just not comfortable" with this. Prefers to see Dr  Chalmers Cater, a friend sees him, but wants to wait on blood testing. BP at ome has been < 140/90 per pt, and states he did not take his meds yet this am since he was coming here.  Declines flu shot  Also of note, he is not completely averse to insulin as his daughter has had type I DM since 52 yo Past Medical History:  Diagnosis Date  . Diabetes mellitus without complication ([place])    type II  . History of kidney stones   . Hyperlipidemia   . Hypertension   . Kidney stones   . Stomach ulcer    Past Surgical History:  Procedure Laterality Date  . CYSTOSCOPY Left 12/10/2016   Procedure: cystoscopy with left ureteral stone extraction ;  Surgeon: Kathie Rhodes, MD;  Location: [facility];  Service: Urology;  Laterality: Left;  . HERNIA REPAIR    . Stomach ulcer      reports that he has never smoked. He has never used smokeless tobacco. He reports that he drinks about 0.6 - 1.2 oz of alcohol per week . He reports that he does not  use drugs. family history includes Diabetes in his daughter, father, sister, and son; Hypertension in his father and mother; Stroke in his father. No Known Allergies Current Outpatient Prescriptions on File Prior to Visit  Medication Sig Dispense Refill  . amLODipine (NORVASC) 10 MG tablet Take 1 tablet (10 mg total) by mouth daily. Follow-up appt is due must see provider for future refills 30 tablet 0  . aspirin 81 MG chewable tablet Chew 81 mg by mouth daily.    Marland Kitchen atorvastatin (LIPITOR) 40 MG tablet TAKE ONE TABLET BY MOUTH ONCE DAILY 90 tablet 1  . Blood Glucose Monitoring Suppl (ONETOUCH VERIO) w/Device KIT 1 Device by Does not apply route once. 1 kit 0  . glimepiride (AMARYL) 4 MG tablet Take 1 tablet (4 mg total) by mouth daily with breakfast. 30 tablet 11  . glucose blood (ONETOUCH VERIO) test strip 1 each by Other route daily. And lancets 1/day 100 each 12  . ibuprofen (ADVIL,MOTRIN) 600 MG tablet Take 1 tablet (600 mg total) by mouth every 6 (six) hours as needed. (Patient taking differently: Take 600 mg by mouth every 6 (six) hours as needed for headache, mild pain or moderate pain. ) 30 tablet 0  . lisinopril (PRINIVIL,ZESTRIL) 40 MG tablet Take 1 tablet (40 mg total)  by mouth daily. Must keep appt w/new provider for future refills 30 tablet 0  . metFORMIN (GLUCOPHAGE-XR) 500 MG 24 hr tablet TAKE TWO TABLETS BY MOUTH TWICE DAILY WITH A MEAL 120 tablet 3  . omeprazole (PRILOSEC) 40 MG capsule Take 1 capsule (40 mg total) by mouth daily. 30 capsule 1  . ONETOUCH DELICA LANCETS 18A MISC Use to check blood sugar 1 time per day. DX Code: E11.9 100 each 2  . oxyCODONE-acetaminophen (PERCOCET/ROXICET) 5-325 MG tablet Take 1-2 tablets by mouth every 6 (six) hours as needed for severe pain. 20 tablet 0  . pantoprazole (PROTONIX) 20 MG tablet Take 1 tablet (20 mg total) by mouth daily. 30 tablet 0  . tamsulosin (FLOMAX) 0.4 MG CAPS capsule Take 1 capsule (0.4 mg total) by mouth daily. 30 capsule 0    No current facility-administered medications on file prior to visit.    Review of Systems  Constitutional: Negative for other unusual diaphoresis or sweats HENT: Negative for ear discharge or swelling Eyes: Negative for other worsening visual disturbances Respiratory: Negative for stridor or other swelling  Gastrointestinal: Negative for worsening distension or other blood Genitourinary: Negative for retention or other urinary change Musculoskeletal: Negative for other MSK pain or swelling Skin: Negative for color change or other new lesions Neurological: Negative for worsening tremors and other numbness  Psychiatric/Behavioral: Negative for worsening agitation or other fatigue All other system neg per pt    Objective:   Physical Exam BP (!) 148/90 Comment: Pt stated he forgot to take meds this am  Pulse 93   Temp 98.1 F (36.7 C) (Oral)   Ht 5' 10" (1.778 m)   Wt 196 lb (88.9 kg)   SpO2 98%   BMI 28.12 kg/m  VS noted, overwt  Constitutional: Pt appears in NAD HENT: Head: NCAT.  Right Ear: External ear normal.  Left Ear: External ear normal.  Eyes: . Pupils are equal, round, and reactive to light. Conjunctivae and EOM are normal Nose: without d/c or deformity Neck: Neck supple. Gross normal ROM Cardiovascular: Normal rate and regular rhythm.   Pulmonary/Chest: Effort normal and breath sounds without rales or wheezing.  Abd:  Soft, NT, ND, + BS, no organomegaly Neurological: Pt is alert. At baseline orientation, motor grossly intact Skin: Skin is warm. No rashes, other new lesions, no LE edema Psychiatric: Pt behavior is normal without agitation  No other exam findings Lab Results  Component Value Date   WBC 9.2 12/02/2016   HGB 12.6 (L) 12/02/2016   HCT 38.9 (L) 12/02/2016   PLT 266 12/02/2016   GLUCOSE 190 (H) 12/02/2016   CHOL 261 (H) 06/08/2016   TRIG 165.0 (H) 06/08/2016   HDL 37.50 (L) 06/08/2016   LDLCALC 191 (H) 06/08/2016   ALT 18 12/02/2016   AST 24  12/02/2016   NA 135 12/02/2016   K 4.3 12/02/2016   CL 102 12/02/2016   CREATININE 1.25 (H) 12/02/2016   BUN 21 (H) 12/02/2016   CO2 23 12/02/2016   TSH 1.10 03/18/2015   PSA 0.89 06/08/2016   HGBA1C 10.3 (H) 06/08/2016   MICROALBUR 10.6 (H) 06/08/2016       Assessment & Plan:

## 2017-03-13 NOTE — Assessment & Plan Note (Signed)
Severe LDL elevation, encouraged low chol DM diet, declines DM education for now, to cont current lipitor 40

## 2017-03-13 NOTE — Assessment & Plan Note (Signed)
Likely improved control with better compliance with meds, for f/u lab today, consider insulin such as less expensive 70/30 or referral to Dr Kipp Brood

## 2017-03-14 ENCOUNTER — Telehealth: Payer: Self-pay

## 2017-03-14 NOTE — Telephone Encounter (Signed)
-----   Message from Biagio Borg, MD sent at 03/13/2017  7:40 PM EDT ----- Left message on MyChart, pt to cont same tx except  The test results show that your current treatment is OK, except the A1c is still quite high.  We need to add a third medication called Actos at 30 mg per day (generic)  I will send a prescription and you should hear from the office as well.    Jon [name] to please inform pt, I will do rx

## 2017-03-14 NOTE — Telephone Encounter (Signed)
Pt has been informed and expressed understanding.  

## 2017-03-20 ENCOUNTER — Telehealth: Payer: Self-pay | Admitting: Internal Medicine

## 2017-03-20 MED ORDER — GLIMEPIRIDE 4 MG PO TABS: 8.0000 mg | ORAL_TABLET | Freq: Every day | ORAL | 11 refills | Status: DC

## 2017-03-20 NOTE — Telephone Encounter (Signed)
Pt has been informed and expressed understanding.  

## 2017-03-20 NOTE — Telephone Encounter (Signed)
Ok to not take the [employer] to increase the glimeparide he takes to 8 mg per day - done erx

## 2017-03-20 NOTE — Telephone Encounter (Signed)
Noted.  Dr. John FYI 

## 2017-03-20 NOTE — Telephone Encounter (Signed)
Pt called stating that he went to pick up his pioglitazone (ACTOS) 30 MG prescription and it was too expensive and would like to try something else. He is a cash paying patient so I recommend that he check with the pharmacy to see what would be comparable that may be cheaper.

## 2017-03-21 ENCOUNTER — Other Ambulatory Visit: Payer: Self-pay | Admitting: *Deleted

## 2017-03-21 MED ORDER — AMLODIPINE BESYLATE 10 MG PO TABS: 10.0000 mg | ORAL_TABLET | Freq: Every day | ORAL | 1 refills | Status: DC

## 2017-04-06 ENCOUNTER — Other Ambulatory Visit: Payer: Self-pay | Admitting: Family

## 2017-04-10 ENCOUNTER — Other Ambulatory Visit: Payer: Self-pay | Admitting: Internal Medicine

## 2017-04-11 ENCOUNTER — Telehealth: Payer: Self-pay | Admitting: Internal Medicine

## 2017-04-11 MED ORDER — ATORVASTATIN CALCIUM 40 MG PO TABS: 40.0000 mg | ORAL_TABLET | Freq: Every day | ORAL | 1 refills | Status: DC

## 2017-04-11 NOTE — Telephone Encounter (Signed)
Copied from Media. Topic: Quick Communication - See Telephone Encounter >> Apr 11, 2017  4:08 PM Percell Belt A wrote: CRM for notification. See Telephone encounter for:  pt needs refill on atorvastatin.  The pharmacy sent this refill to greg calone instead of Dr Jenny Reichmann.   04/11/17.

## 2017-04-11 NOTE — Telephone Encounter (Signed)
Reviewed chart pt is up-to-date sent refills to [facility].../lmb  

## 2017-04-26 ENCOUNTER — Encounter (HOSPITAL_COMMUNITY): Payer: Self-pay | Admitting: Emergency Medicine

## 2017-04-26 ENCOUNTER — Emergency Department (HOSPITAL_COMMUNITY): Payer: Self-pay

## 2017-04-26 ENCOUNTER — Inpatient Hospital Stay (HOSPITAL_COMMUNITY)
Admission: EM | Admit: 2017-04-26 | Discharge: 2017-04-27 | DRG: 065 | Disposition: A | Discharge status: Home / Self Care | Payer: Self-pay | Attending: Internal Medicine | Admitting: Internal Medicine

## 2017-04-26 ENCOUNTER — Inpatient Hospital Stay (HOSPITAL_COMMUNITY): Payer: Self-pay

## 2017-04-26 ENCOUNTER — Other Ambulatory Visit: Payer: Self-pay

## 2017-04-26 ENCOUNTER — Ambulatory Visit: Payer: Self-pay | Admitting: *Deleted

## 2017-04-26 DIAGNOSIS — G8191 Hemiplegia, unspecified affecting right dominant side: Secondary | ICD-10-CM | POA: Diagnosis present

## 2017-04-26 DIAGNOSIS — I10 Essential (primary) hypertension: Secondary | ICD-10-CM | POA: Diagnosis present

## 2017-04-26 DIAGNOSIS — E1165 Type 2 diabetes mellitus with hyperglycemia: Secondary | ICD-10-CM | POA: Diagnosis present

## 2017-04-26 DIAGNOSIS — Z87442 Personal history of urinary calculi: Secondary | ICD-10-CM

## 2017-04-26 DIAGNOSIS — Z7984 Long term (current) use of oral hypoglycemic drugs: Secondary | ICD-10-CM

## 2017-04-26 DIAGNOSIS — E785 Hyperlipidemia, unspecified: Secondary | ICD-10-CM | POA: Diagnosis present

## 2017-04-26 DIAGNOSIS — Z7983 Long term (current) use of bisphosphonates: Secondary | ICD-10-CM

## 2017-04-26 DIAGNOSIS — Z833 Family history of diabetes mellitus: Secondary | ICD-10-CM

## 2017-04-26 DIAGNOSIS — I63312 Cerebral infarction due to thrombosis of left middle cerebral artery: Secondary | ICD-10-CM

## 2017-04-26 DIAGNOSIS — Z823 Family history of stroke: Secondary | ICD-10-CM

## 2017-04-26 DIAGNOSIS — R29810 Facial weakness: Secondary | ICD-10-CM | POA: Diagnosis present

## 2017-04-26 DIAGNOSIS — I639 Cerebral infarction, unspecified: Secondary | ICD-10-CM | POA: Diagnosis present

## 2017-04-26 DIAGNOSIS — R471 Dysarthria and anarthria: Secondary | ICD-10-CM | POA: Diagnosis present

## 2017-04-26 DIAGNOSIS — I5030 Unspecified diastolic (congestive) heart failure: Secondary | ICD-10-CM

## 2017-04-26 DIAGNOSIS — R4781 Slurred speech: Secondary | ICD-10-CM | POA: Diagnosis present

## 2017-04-26 DIAGNOSIS — Z7982 Long term (current) use of aspirin: Secondary | ICD-10-CM

## 2017-04-26 DIAGNOSIS — E119 Type 2 diabetes mellitus without complications: Secondary | ICD-10-CM

## 2017-04-26 DIAGNOSIS — Z79899 Other long term (current) drug therapy: Secondary | ICD-10-CM

## 2017-04-26 DIAGNOSIS — E1151 Type 2 diabetes mellitus with diabetic peripheral angiopathy without gangrene: Secondary | ICD-10-CM | POA: Diagnosis present

## 2017-04-26 DIAGNOSIS — I6381 Other cerebral infarction due to occlusion or stenosis of small artery: Principal | ICD-10-CM | POA: Diagnosis present

## 2017-04-26 LAB — DIFFERENTIAL
BASOS ABS: 0 10*3/uL (ref 0.0–0.1)
BASOS PCT: 1 %
EOS ABS: 0.2 10*3/uL (ref 0.0–0.7)
Eosinophils Relative: 3 %
Lymphocytes Relative: 27 %
Lymphs Abs: 1.3 10*3/uL (ref 0.7–4.0)
MONOS PCT: 7 %
Monocytes Absolute: 0.3 10*3/uL (ref 0.1–1.0)
NEUTROS PCT: 62 %
Neutro Abs: 3 10*3/uL (ref 1.7–7.7)

## 2017-04-26 LAB — URINALYSIS, ROUTINE W REFLEX MICROSCOPIC
Bacteria, UA: NONE SEEN
Bilirubin Urine: NEGATIVE
Glucose, UA: >=500 mg/dL — AB
Hgb urine dipstick: NEGATIVE
KETONES UR: NEGATIVE mg/dL
Leukocytes, UA: NEGATIVE
Nitrite: NEGATIVE
PROTEIN: NEGATIVE mg/dL
RBC / HPF: NONE SEEN RBC/hpf (ref 0–5)
SQUAMOUS EPITHELIAL / LPF: NONE SEEN
Specific Gravity, Urine: 1.016 (ref 1.005–1.030)
pH: 6 (ref 5.0–8.0)

## 2017-04-26 LAB — ECHOCARDIOGRAM COMPLETE
E decel time: 246 msec
E/e' ratio: 13.47
FS: 23 % — AB (ref 28–44)
IV/PV OW: 1.12
LA diam end sys: 35 mm
LA vol index: 26.2 mL/m2
LADIAMINDEX: 1.69 cm/m2
LASIZE: 35 mm
LAVOL: 54.2 mL
LAVOLA4C: 44.1 mL
LV E/e' medial: 13.47
LV e' LATERAL: 5.44 cm/s
LVEEAVG: 13.47
LVOT SV: 64 mL
LVOT VTI: 18.5 cm
LVOT area: 3.46 cm2
LVOT diameter: 21 mm
LVOT peak vel: 85.5 cm/s
MV Dec: 246
MV pk A vel: 89.1 m/s
MVPG: 2 mmHg
MVPKEVEL: 73.3 m/s
PW: 12.5 mm — AB (ref 0.6–1.1)
RV LATERAL S' VELOCITY: 13.1 cm/s
Reg peak vel: 204 cm/s
TDI e' lateral: 5.44
TDI e' medial: 6.42
TRMAXVEL: 204 cm/s

## 2017-04-26 LAB — CBC
HEMATOCRIT: 37.8 % — AB (ref 39.0–52.0)
HEMATOCRIT: 37.4 % — AB (ref 39.0–52.0)
Hemoglobin: 11.9 g/dL — ABNORMAL LOW (ref 13.0–17.0)
Hemoglobin: 11.7 g/dL — ABNORMAL LOW (ref 13.0–17.0)
MCH: 24.9 pg — ABNORMAL LOW (ref 26.0–34.0)
MCH: 24.7 pg — AB (ref 26.0–34.0)
MCHC: 31.5 g/dL (ref 30.0–36.0)
MCHC: 31.3 g/dL (ref 30.0–36.0)
MCV: 79.2 fL (ref 78.0–100.0)
MCV: 79.1 fL (ref 78.0–100.0)
PLATELETS: 235 10*3/uL (ref 150–400)
Platelets: 246 10*3/uL (ref 150–400)
RBC: 4.77 MIL/uL (ref 4.22–5.81)
RBC: 4.73 MIL/uL (ref 4.22–5.81)
RDW: 15 % (ref 11.5–15.5)
RDW: 14.9 % (ref 11.5–15.5)
WBC: 4.8 10*3/uL (ref 4.0–10.5)
WBC: 4.7 10*3/uL (ref 4.0–10.5)

## 2017-04-26 LAB — CREATININE, SERUM
Creatinine, Ser: 0.87 mg/dL (ref 0.61–1.24)
GFR calc non Af Amer: >60 mL/min (ref >60)

## 2017-04-26 LAB — RAPID URINE DRUG SCREEN, HOSP PERFORMED
Amphetamines: NOT DETECTED
BARBITURATES: NOT DETECTED
BENZODIAZEPINES: NOT DETECTED
COCAINE: NOT DETECTED
Opiates: NOT DETECTED
Tetrahydrocannabinol: NOT DETECTED

## 2017-04-26 LAB — PROTIME-INR
INR: 0.99
PROTHROMBIN TIME: 13 s (ref 11.4–15.2)

## 2017-04-26 LAB — I-STAT CHEM 8, ED
BUN: 10 mg/dL (ref 6–20)
CHLORIDE: 102 mmol/L (ref 101–111)
Calcium, Ion: 1.12 mmol/L — ABNORMAL LOW (ref 1.15–1.40)
Creatinine, Ser: 0.7 mg/dL (ref 0.61–1.24)
Glucose, Bld: 291 mg/dL — ABNORMAL HIGH (ref 65–99)
HEMATOCRIT: 39 % (ref 39.0–52.0)
HEMOGLOBIN: 13.3 g/dL (ref 13.0–17.0)
POTASSIUM: 4.2 mmol/L (ref 3.5–5.1)
SODIUM: 140 mmol/L (ref 135–145)
TCO2: 24 mmol/L (ref 22–32)

## 2017-04-26 LAB — COMPREHENSIVE METABOLIC PANEL
ALT: 20 U/L (ref 17–63)
AST: 25 U/L (ref 15–41)
Albumin: 3.8 g/dL (ref 3.5–5.0)
Alkaline Phosphatase: 59 U/L (ref 38–126)
Anion gap: 9 (ref 5–15)
BUN: 9 mg/dL (ref 6–20)
CHLORIDE: 105 mmol/L (ref 101–111)
CO2: 23 mmol/L (ref 22–32)
Calcium: 9.1 mg/dL (ref 8.9–10.3)
Creatinine, Ser: 0.91 mg/dL (ref 0.61–1.24)
GFR calc Af Amer: >60 mL/min (ref >60)
Glucose, Bld: 287 mg/dL — ABNORMAL HIGH (ref 65–99)
POTASSIUM: 4.3 mmol/L (ref 3.5–5.1)
SODIUM: 137 mmol/L (ref 135–145)
Total Bilirubin: 0.4 mg/dL (ref 0.3–1.2)
Total Protein: 7 g/dL (ref 6.5–8.1)

## 2017-04-26 LAB — ETHANOL

## 2017-04-26 LAB — I-STAT TROPONIN, ED: TROPONIN I, POC: 0 ng/mL (ref 0.00–0.08)

## 2017-04-26 LAB — APTT: APTT: 30 s (ref 24–36)

## 2017-04-26 LAB — MRSA PCR SCREENING: MRSA by PCR: NEGATIVE

## 2017-04-26 MED ORDER — ONDANSETRON HCL 4 MG PO TABS: 4.0000 mg | ORAL_TABLET | Freq: Four times a day (QID) | ORAL | Status: DC | PRN

## 2017-04-26 MED ORDER — ENOXAPARIN SODIUM 40 MG/0.4ML ~~LOC~~ SOLN: 40.0000 mg | SUBCUTANEOUS | Status: DC

## 2017-04-26 MED ORDER — TAMSULOSIN HCL 0.4 MG PO CAPS
0.4000 mg | ORAL_CAPSULE | Freq: Every day | ORAL | Status: DC
  Administered 2017-04-26: 0.4 mg via ORAL
  Filled 2017-04-26 (×2): qty 1

## 2017-04-26 MED ORDER — HEPARIN SODIUM (PORCINE) 5000 UNIT/ML IJ SOLN
5000.0000 [IU] | Freq: Three times a day (TID) | INTRAMUSCULAR | Status: DC
  Administered 2017-04-26 – 2017-04-27 (×3): 5000 [IU] via SUBCUTANEOUS
  Filled 2017-04-26 (×3): qty 1

## 2017-04-26 MED ORDER — ASPIRIN 81 MG PO CHEW
81.0000 mg | CHEWABLE_TABLET | Freq: Every day | ORAL | Status: DC
  Administered 2017-04-26: 81 mg via ORAL
  Filled 2017-04-26: qty 1

## 2017-04-26 MED ORDER — AMLODIPINE BESYLATE 10 MG PO TABS
10.0000 mg | ORAL_TABLET | Freq: Every day | ORAL | Status: DC
  Administered 2017-04-26: 10 mg via ORAL
  Filled 2017-04-26: qty 1

## 2017-04-26 MED ORDER — LISINOPRIL 20 MG PO TABS
40.0000 mg | ORAL_TABLET | Freq: Every day | ORAL | Status: DC
  Administered 2017-04-26: 40 mg via ORAL
  Filled 2017-04-26: qty 2

## 2017-04-26 MED ORDER — GLIMEPIRIDE 4 MG PO TABS
8.0000 mg | ORAL_TABLET | Freq: Every day | ORAL | Status: DC
  Filled 2017-04-26 (×2): qty 2

## 2017-04-26 MED ORDER — IOPAMIDOL (ISOVUE-370) INJECTION 76%
INTRAVENOUS | Status: AC
  Administered 2017-04-27: 50 mL
  Filled 2017-04-26: qty 50

## 2017-04-26 MED ORDER — SODIUM CHLORIDE 0.9 % IV SOLN: 250.0000 mL | INTRAVENOUS | Status: DC | PRN

## 2017-04-26 MED ORDER — HYDRALAZINE HCL 20 MG/ML IJ SOLN: 5.0000 mg | INTRAMUSCULAR | Status: DC | PRN

## 2017-04-26 MED ORDER — METFORMIN HCL ER 500 MG PO TB24
500.0000 mg | ORAL_TABLET | Freq: Every day | ORAL | Status: DC
  Filled 2017-04-26: qty 1

## 2017-04-26 MED ORDER — SODIUM CHLORIDE 0.9% FLUSH: 3.0000 mL | INTRAVENOUS | Status: DC | PRN

## 2017-04-26 MED ORDER — ONDANSETRON HCL 4 MG/2ML IJ SOLN: 4.0000 mg | Freq: Four times a day (QID) | INTRAMUSCULAR | Status: DC | PRN

## 2017-04-26 MED ORDER — ATORVASTATIN CALCIUM 40 MG PO TABS
40.0000 mg | ORAL_TABLET | Freq: Every day | ORAL | Status: DC
  Administered 2017-04-26 – 2017-04-27 (×2): 40 mg via ORAL
  Filled 2017-04-26 (×3): qty 1

## 2017-04-26 MED ORDER — SODIUM CHLORIDE 0.9% FLUSH
3.0000 mL | Freq: Two times a day (BID) | INTRAVENOUS | Status: DC
  Administered 2017-04-26 – 2017-04-27 (×2): 3 mL via INTRAVENOUS

## 2017-04-26 MED ORDER — ASPIRIN EC 325 MG PO TBEC
325.0000 mg | DELAYED_RELEASE_TABLET | Freq: Every day | ORAL | Status: DC
  Administered 2017-04-27: 325 mg via ORAL
  Filled 2017-04-26: qty 1

## 2017-04-26 MED ORDER — SENNOSIDES-DOCUSATE SODIUM 8.6-50 MG PO TABS: 1.0000 | ORAL_TABLET | Freq: Every evening | ORAL | Status: DC | PRN

## 2017-04-26 MED ORDER — ACETAMINOPHEN 325 MG PO TABS: 650.0000 mg | ORAL_TABLET | Freq: Four times a day (QID) | ORAL | Status: DC | PRN

## 2017-04-26 MED ORDER — ACETAMINOPHEN 650 MG RE SUPP: 650.0000 mg | Freq: Four times a day (QID) | RECTAL | Status: DC | PRN

## 2017-04-26 MED ORDER — ENSURE ENLIVE PO LIQD: 237.0000 mL | Freq: Two times a day (BID) | ORAL | Status: DC

## 2017-04-26 NOTE — ED Notes (Signed)
Message sent to pharmacy to verify and send medications if needed

## 2017-04-26 NOTE — H&P (Signed)
History and Physical    Ruffin Lada LXB:[id number] DOB: April 01, 1956 DOA: 04/26/2017  PCP: Biagio Borg, MD Patient coming from: home  Chief Complaint: Slurred speech and right sided weakness  HPI: Telford Archambeau is a 61 y.o. male with medical history significant of Diabetes, Hypertension and Hyperlipidemia.  Pt presented to the Emergency department this morning complaining of his right hand not working. Pt awoke around 6am today. Pt was using a computer and was unable to click of uses keys.  Pt noticed difficulty walking.  He felt as if he was dragging his right leg.  Pt reports difficulty speaking.  Pt had some slurred speech.  Pt went to work but was unable to work.  Family Md advised pt to go to the Emergency department for evaluation.   Pt reports symptoms are improving. Weakness and speech are improving.   Pt denies fever, no nausea or vomiting. No chest pain, no shortness of breath.  Pt felt well yesterday and was normal at bedtime.     ED Course: Pt was seen by Dr. Oleta Mouse in the Ed and evaluated for CVA.  Ct scan showed subtle area of low attenuation which could be area of infarction. Pt has MRi ordered to evaluate. Pt has had improving symptoms  Review of Systems: As per HPI otherwise all other systems reviewed and are negative   Ambulatory Status: ambulatory  Past Medical History:  Diagnosis Date  . Diabetes mellitus without complication ([place])    type II  . History of kidney stones   . Hyperlipidemia   . Hypertension   . Kidney stones   . Stomach ulcer     Past Surgical History:  Procedure Laterality Date  . CYSTOSCOPY Left 12/10/2016   Procedure: cystoscopy with left ureteral stone extraction ;  Surgeon: Kathie Rhodes, MD;  Location: [facility];  Service: Urology;  Laterality: Left;  . HERNIA REPAIR    . Stomach ulcer      Social History   Socioeconomic History  . Marital status: Married    Spouse name: Not on file  . Number of children: 4  . Years of education: 69    . Highest education level: Not on file  Social Needs  . Financial resource strain: Not on file  . Food insecurity - worry: Not on file  . Food insecurity - inability: Not on file  . Transportation needs - medical: Not on file  . Transportation needs - non-medical: Not on file  Occupational History  . Occupation: Unemployed  Tobacco Use  . Smoking status: Never Smoker  . Smokeless tobacco: Never Used  Substance and Sexual Activity  . Alcohol use: Yes    Alcohol/week: 0.6 - 1.2 oz    Types: 1 - 2 Glasses of wine per week    Comment: socially  . Drug use: No  . Sexual activity: Not on file  Other Topics Concern  . Not on file  Social History Narrative   Fun: Politics    No Known Allergies  Family History  Problem Relation Age of Onset  . Diabetes Father   . Stroke Father   . Hypertension Father   . Hypertension Mother   . Diabetes Sister   . Diabetes Son   . Diabetes Daughter     Prior to Admission medications   Medication Sig Start Date End Date Taking? Authorizing Provider  amLODipine (NORVASC) 10 MG tablet Take 1 tablet (10 mg total) daily by mouth. 03/21/17   Biagio Borg,  MD  aspirin 81 MG chewable tablet Chew 81 mg by mouth daily.    [provider]  atorvastatin (LIPITOR) 40 MG tablet Take 1 tablet (40 mg total) by mouth daily. 04/11/17   Biagio Borg, MD  Blood Glucose Monitoring Suppl (ONETOUCH VERIO) w/Device KIT 1 Device by Does not apply route once. 08/01/15   Renato Shin, MD  glimepiride (AMARYL) 4 MG tablet Take 2 tablets (8 mg total) daily with breakfast by mouth. 03/20/17   Biagio Borg, MD  glucose blood [facility] VERIO) test strip 1 each by Other route daily. And lancets 1/day 08/01/15   Renato Shin, MD  lisinopril (PRINIVIL,ZESTRIL) 40 MG tablet TAKE 1 TABLET BY MOUTH DAILY 04/11/17   Biagio Borg, MD  metFORMIN (GLUCOPHAGE-XR) 500 MG 24 hr tablet TAKE TWO TABLETS BY MOUTH TWICE DAILY WITH A MEAL 12/19/16   Renato Shin, MD  [facility]  DELICA LANCETS 11Z MISC Use to check blood sugar 1 time per day. DX Code: E11.9 08/02/15   Renato Shin, MD  oxyCODONE-acetaminophen (PERCOCET/ROXICET) 5-325 MG tablet Take 1-2 tablets by mouth every 6 (six) hours as needed for severe pain. 12/08/16   Lajean Saver, MD  tamsulosin (FLOMAX) 0.4 MG CAPS capsule Take 1 capsule (0.4 mg total) by mouth daily. 12/02/16   Charlesetta Shanks, MD    Physical Exam: Vitals:   04/26/17 0848 04/26/17 0900 04/26/17 0915 04/26/17 0945  BP: (!) 163/87 (!) 150/96 (!) 154/87 140/85  Pulse: 82 81 79 67  Resp: _0 Temp: 98.2 F (36.8 C)     TempSrc: Oral     SpO2: 99% 99% 99% 99%     General:  Appears calm and comfortable Eyes:  PERRL, EOMI, normal lids, iris ENT:  grossly normal hearing, lips & tongue, mmm Neck:  no LAD, masses or thyromegaly Cardiovascular:  RRR, no m/r/g. No LE edema.  Respiratory:  CTA bilaterally, no w/r/r. Normal respiratory effort. Abdomen:  soft, ntnd, NABS Skin:  no rash or induration seen on limited exam Musculoskeletal:  grossly normal tone BUE/BLE, good ROM, no bony abnormality Psychiatric:  grossly normal mood and affect, speech fluent and appropriate, AOx3 Neurologic:  CN 2-12 speech slightly slurred  moves all extremities in coordinated fashion,  Slight weakness right leg, sensation intact  Labs on Admission: I have personally reviewed following labs and imaging studies  CBC: Recent Labs  Lab 04/26/17 0907 04/26/17 0919  WBC 4.8  --   NEUTROABS 3.0  --   HGB 11.9* 13.3  HCT 37.8* 39.0  MCV 79.2  --   PLT 235  --    Basic Metabolic Panel: Recent Labs  Lab 04/26/17 0907 04/26/17 0919  NA 137 140  K 4.3 4.2  CL 105 102  CO2 23  --   GLUCOSE 287* 291*  BUN 9 10  CREATININE 0.91 0.70  CALCIUM 9.1  --    GFR: CrCl cannot be calculated (Unknown ideal weight.). Liver Function Tests: Recent Labs  Lab 04/26/17 0907  AST 25  ALT 20  ALKPHOS 59  BILITOT 0.4  PROT 7.0  ALBUMIN 3.8   No results  for input(s): LIPASE, AMYLASE in the last 168 hours. No results for input(s): AMMONIA in the last 168 hours. Coagulation Profile: Recent Labs  Lab 04/26/17 0907  INR 0.99   Cardiac Enzymes: No results for input(s): CKTOTAL, CKMB, CKMBINDEX, TROPONINI in the last 168 hours. BNP (last 3 results) No results for input(s): PROBNP in the last 8760  hours. HbA1C: No results for input(s): HGBA1C in the last 72 hours. CBG: No results for input(s): GLUCAP in the last 168 hours. Lipid Profile: No results for input(s): CHOL, HDL, LDLCALC, TRIG, CHOLHDL, LDLDIRECT in the last 72 hours. Thyroid Function Tests: No results for input(s): TSH, T4TOTAL, FREET4, T3FREE, THYROIDAB in the last 72 hours. Anemia Panel: No results for input(s): VITAMINB12, FOLATE, FERRITIN, TIBC, IRON, RETICCTPCT in the last 72 hours. Urine analysis:    Component Value Date/Time   COLORURINE YELLOW 04/26/2017 0913   APPEARANCEUR CLEAR 04/26/2017 0913   LABSPEC 1.016 04/26/2017 0913   PHURINE 6.0 04/26/2017 0913   GLUCOSEU >=500 (A) 04/26/2017 0913   HGBUR NEGATIVE 04/26/2017 0913   BILIRUBINUR NEGATIVE 04/26/2017 0913   KETONESUR NEGATIVE 04/26/2017 0913   PROTEINUR NEGATIVE 04/26/2017 0913   NITRITE NEGATIVE 04/26/2017 0913   LEUKOCYTESUR NEGATIVE 04/26/2017 0913    Creatinine Clearance: CrCl cannot be calculated (Unknown ideal weight.).  Sepsis Labs: _0 (procalcitonin:4,lacticidven:4) )No results found for this or any previous visit (from the past 240 hour(s)).   Radiological Exams on Admission: Ct Head Wo Contrast  Result Date: 04/26/2017 CLINICAL DATA:  61 year old male with history of right-sided leg and arm weakness today. Slurred speech. EXAM: CT HEAD WITHOUT CONTRAST TECHNIQUE: Contiguous axial images were obtained from the base of the skull through the vertex without intravenous contrast. COMPARISON:  None. FINDINGS: Brain: Slightly ill-defined area of low attenuation associated with the  posterior limb of the left internal capsule (axial image 16 of series 3) could reflect age-indeterminate ischemia. Patchy and confluent areas of decreased attenuation are noted throughout the deep and periventricular white matter of the cerebral hemispheres bilaterally, compatible with chronic microvascular ischemic disease. No evidence of acute hemorrhage, hydrocephalus, extra-axial collection or mass lesion/mass effect. Vascular: No hyperdense vessel or unexpected calcification. Skull: Normal. Negative for fracture or focal lesion. Sinuses/Orbits: Mild multifocal mucosal thickening in the paranasal sinuses without air-fluid levels. No acute finding. Other: None. IMPRESSION: 1. Subtle slightly ill-defined area of low attenuation associated with the posterior limb of the left internal capsule which could reflect age-indeterminate ischemia. If there is strong clinical concern for acute infarction, this could be further evaluated with MRI of the brain without contrast. 2. Chronic microvascular ischemic changes in the cerebral white matter. These results were called by telephone at the time of interpretation on 04/26/2017 at 9:33 am to Dr. Brantley Stage, who verbally acknowledged these results. Electronically Signed   By: Vinnie Langton M.D.   On: 04/26/2017 09:34    EKG: Independently reviewed.   Assessment/Plan Active Problems:   CVA (cerebral vascular accident) [facility])   --Neurologist to consult,  --MRi brain and neck pending --Pt and Ot consult  Hypertension --Continue current medications Norvasc and Lisinopril   Hyperlipidemia --Continue Lipitor  Diabetes. Type 2 --continue Metformin      DVT prophylaxis: Lovenox Code Status: Full  Family Communication:  Disposition Plan: Admission for further evaluation  Consults called: Neurology Dr. Leonel Ramsay will consult Admission status: Inpatient   Alyse Low PA-C [facility]   If 7PM-7AM, please contact  night-coverage [email or web address] Password [email or web address]  04/26/2017, 10:58 AM

## 2017-04-26 NOTE — ED Notes (Addendum)
Pt walked to RR

## 2017-04-26 NOTE — Progress Notes (Signed)
  Echocardiogram 2D Echocardiogram has been performed.  Jennette Dubin 04/26/2017, 2:18 PM

## 2017-04-26 NOTE — Telephone Encounter (Signed)
Pt called complaining of "Right hand not working, dragging right foot, and coordination off', this started about 0600 today; pt also states that he tried to go to work this am but he "was not effective"; per nurse triage protocol EMS summoned by pt's wife Oris Drone; triage RN stayed on phone with pt and his wife until EMS arrived; will route to [place] pool for notification of this event    Reason for Disposition . [1] Weakness (i.e., paralysis, loss of muscle strength) of the face, arm / hand, or leg / foot on one side of the body AND [2] sudden onset AND [3] present now  Answer Assessment - Initial Assessment Questions 1. SYMPTOM: "What is the main symptom you are concerned about?" (e.g., weakness, numbness)     weakness 2. ONSET: "When did this start?" (minutes, hours, days; while sleeping)     0600 this am 3. LAST NORMAL: "When was the last time you were normal (no symptoms)?"     Prior to rising 4. PATTERN "Does this come and go, or has it been constant since it started?"  "Is it present now?"     constant 5. CARDIAC SYMPTOMS: "Have you had any of the following symptoms: chest pain, difficulty breathing, palpitations?"     no 6. NEUROLOGIC SYMPTOMS: "Have you had any of the following symptoms: headache, dizziness, vision loss, double vision, changes in speech, unsteady on your feet?"     Slurred speech 7. OTHER SYMPTOMS: "Do you have any other symptoms?"     Right hand not working; dragging right foot 8. PREGNANCY: "Is there any chance you are pregnant?" "When was your last menstrual period?"     n/a  Protocols used: NEUROLOGIC DEFICIT-A-AH

## 2017-04-26 NOTE — ED Provider Notes (Signed)
[facility] EMERGENCY DEPARTMENT Provider Note   CSN: [id number] Arrival date & time: 04/26/17  9509     History   Chief Complaint Chief Complaint  Patient presents with  . Transient Ischemic Attack    [name] is a 61 y.o. male.  HPI 61 year old male who presents with slurred speech and right-sided weakness.  He has a history of diabetes, hypertension, hyperlipidemia.  He reports going to bed in his usual state of health.  He woke up at 6 AM in the morning, and noticed that his right hand was not working normally.  Having difficulty shaving and brushing his teeth.  Thought it could be related to his blood sugar but his glucose at home was 144.  States that while sitting at the computer afterwards he was unable to click with the mouth or type correctly on the keyboard.  States that when he walked he felt his right leg was dragging.  When he spoke he also felt like his speech was slurred.  He did go to work, but was sent home due to his symptoms and walking off balance.  He called his family doctor who told him to come to the ED for evaluation.  States that his symptoms have gradually improved.  Still complains of minimal weakness in the right arm and leg and slightly slurred speech.  Denies headache, nausea vomiting, fever or recent illnesses.  Denies chest pain, palpitations or difficulty breathing.    Past Medical History:  Diagnosis Date  . Diabetes mellitus without complication ([place])    type II  . History of kidney stones   . Hyperlipidemia   . Hypertension   . Kidney stones   . Stomach ulcer     Patient Active Problem List   Diagnosis Date Noted  . Routine general medical examination at a health care facility 06/04/2016  . Trigger finger, acquired 02/24/2016  . Erectile dysfunction 03/17/2015  . Diabetes ([place]) 03/12/2015  . Dyslipidemia 03/12/2015  . HTN (hypertension) 03/12/2015    Past Surgical History:  Procedure Laterality Date  .  CYSTOSCOPY Left 12/10/2016   Procedure: cystoscopy with left ureteral stone extraction ;  Surgeon: Kathie Rhodes, MD;  Location: [facility];  Service: Urology;  Laterality: Left;  . HERNIA REPAIR    . Stomach ulcer         Home Medications    Prior to Admission medications   Medication Sig Start Date End Date Taking? Authorizing Provider  amLODipine (NORVASC) 10 MG tablet Take 1 tablet (10 mg total) daily by mouth. 03/21/17   Biagio Borg, MD  aspirin 81 MG chewable tablet Chew 81 mg by mouth daily.    [provider]  atorvastatin (LIPITOR) 40 MG tablet Take 1 tablet (40 mg total) by mouth daily. 04/11/17   Biagio Borg, MD  Blood Glucose Monitoring Suppl (ONETOUCH VERIO) w/Device KIT 1 Device by Does not apply route once. 08/01/15   Renato Shin, MD  glimepiride (AMARYL) 4 MG tablet Take 2 tablets (8 mg total) daily with breakfast by mouth. 03/20/17   Biagio Borg, MD  glucose blood [facility] VERIO) test strip 1 each by Other route daily. And lancets 1/day 08/01/15   Renato Shin, MD  ibuprofen (ADVIL,MOTRIN) 600 MG tablet Take 1 tablet (600 mg total) by mouth every 6 (six) hours as needed. Patient taking differently: Take 600 mg by mouth every 6 (six) hours as needed for headache, mild pain or moderate pain.  12/02/16  Charlesetta Shanks, MD  lisinopril (PRINIVIL,ZESTRIL) 40 MG tablet TAKE 1 TABLET BY MOUTH DAILY 04/11/17   Biagio Borg, MD  metFORMIN (GLUCOPHAGE-XR) 500 MG 24 hr tablet TAKE TWO TABLETS BY MOUTH TWICE DAILY WITH A MEAL 12/19/16   Renato Shin, MD  omeprazole (PRILOSEC) 40 MG capsule Take 1 capsule (40 mg total) by mouth daily. 10/27/16   Marin Olp, MD  [facility] DELICA LANCETS 00T MISC Use to check blood sugar 1 time per day. DX Code: E11.9 08/02/15   Renato Shin, MD  oxyCODONE-acetaminophen (PERCOCET/ROXICET) 5-325 MG tablet Take 1-2 tablets by mouth every 6 (six) hours as needed for severe pain. 12/08/16   Lajean Saver, MD  pantoprazole (PROTONIX) 20 MG tablet  Take 1 tablet (20 mg total) by mouth daily. 12/02/16   Charlesetta Shanks, MD  tamsulosin (FLOMAX) 0.4 MG CAPS capsule Take 1 capsule (0.4 mg total) by mouth daily. 12/02/16   Charlesetta Shanks, MD    Family History Family History  Problem Relation Age of Onset  . Diabetes Father   . Stroke Father   . Hypertension Father   . Hypertension Mother   . Diabetes Sister   . Diabetes Son   . Diabetes Daughter     Social History Social History   Tobacco Use  . Smoking status: Never Smoker  . Smokeless tobacco: Never Used  Substance Use Topics  . Alcohol use: Yes    Alcohol/week: 0.6 - 1.2 oz    Types: 1 - 2 Glasses of wine per week    Comment: socially  . Drug use: No     Allergies   Patient has no known allergies.   Review of Systems Review of Systems  Respiratory: Negative for shortness of breath.   Cardiovascular: Negative for chest pain.  Allergic/Immunologic: Negative for immunocompromised state.  Neurological: Positive for speech difficulty and weakness. Negative for numbness.  Hematological: Does not bruise/bleed easily.  Psychiatric/Behavioral: Negative for confusion.     Physical Exam Updated Vital Signs BP 140/85   Pulse 67   Temp 98.2 F (36.8 C) (Oral)   Resp 10   SpO2 99%   Physical Exam Physical Exam  Nursing note and vitals reviewed. Constitutional: Well developed, well nourished, non-toxic, and in no acute distress Head: Normocephalic and atraumatic.  Mouth/Throat: Oropharynx is clear and moist.  Neck: Normal range of motion. Neck supple.  Cardiovascular: Normal rate and regular rhythm.   Pulmonary/Chest: Effort normal and breath sounds normal.  Abdominal: Soft. There is no tenderness. There is no rebound and no guarding.  Musculoskeletal: Normal range of motion.  Neurological: Alert, no facial droop, fluent speech, moves all extremities symmetrically Skin: Skin is warm and dry.  Psychiatric: Cooperative Neurological:  Alert, oriented to person,  place, time, and situation. Memory grossly in tact. Fluent speech. No aphasia. Slight dysarthria. Cranial nerves: VF are full.  EOMI without nystagmus. No gaze deviation. Facial muscles symmetric with activation. Sensation to light touch over face in tact bilaterally. Hearing grossly in tact. Palate elevates symmetrically. Head turn and shoulder shrug are intact. Tongue midline.  Reflexes defered.  Muscle bulk and tone normal. No pronator drift. Moves all extremities symmetrically. Sensation to light touch is in tact throughout in bilateral upper and lower extremities. Coordination reveals no dysmetria with finger to nose.    ED Treatments / Results  Labs (all labs ordered are listed, but only abnormal results are displayed) Labs Reviewed  CBC - Abnormal; Notable for the following components:  Result Value   Hemoglobin 11.9 (*)    HCT 37.8 (*)    MCH 24.9 (*)    All other components within normal limits  COMPREHENSIVE METABOLIC PANEL - Abnormal; Notable for the following components:   Glucose, Bld 287 (*)    All other components within normal limits  URINALYSIS, ROUTINE W REFLEX MICROSCOPIC - Abnormal; Notable for the following components:   Glucose, UA >=500 (*)    All other components within normal limits  I-STAT CHEM 8, ED - Abnormal; Notable for the following components:   Glucose, Bld 291 (*)    Calcium, Ion 1.12 (*)    All other components within normal limits  ETHANOL  PROTIME-INR  APTT  DIFFERENTIAL  RAPID URINE DRUG SCREEN, HOSP PERFORMED  I-STAT TROPONIN, ED    EKG  EKG Interpretation  Date/Time:  Friday April 26 2017 08:46:29 EST Ventricular Rate:  82 PR Interval:    QRS Duration: 90 QT Interval:  357 QTC Calculation: 417 R Axis:   16 Text Interpretation:  Sinus rhythm no acute changes  Confirmed by Brantley Stage [phone]) on 04/26/2017 10:23:52 AM       Radiology Ct Head Wo Contrast  Result Date: 04/26/2017 CLINICAL DATA:  61 year old male with  history of right-sided leg and arm weakness today. Slurred speech. EXAM: CT HEAD WITHOUT CONTRAST TECHNIQUE: Contiguous axial images were obtained from the base of the skull through the vertex without intravenous contrast. COMPARISON:  None. FINDINGS: Brain: Slightly ill-defined area of low attenuation associated with the posterior limb of the left internal capsule (axial image 16 of series 3) could reflect age-indeterminate ischemia. Patchy and confluent areas of decreased attenuation are noted throughout the deep and periventricular white matter of the cerebral hemispheres bilaterally, compatible with chronic microvascular ischemic disease. No evidence of acute hemorrhage, hydrocephalus, extra-axial collection or mass lesion/mass effect. Vascular: No hyperdense vessel or unexpected calcification. Skull: Normal. Negative for fracture or focal lesion. Sinuses/Orbits: Mild multifocal mucosal thickening in the paranasal sinuses without air-fluid levels. No acute finding. Other: None. IMPRESSION: 1. Subtle slightly ill-defined area of low attenuation associated with the posterior limb of the left internal capsule which could reflect age-indeterminate ischemia. If there is strong clinical concern for acute infarction, this could be further evaluated with MRI of the brain without contrast. 2. Chronic microvascular ischemic changes in the cerebral white matter. These results were called by telephone at the time of interpretation on 04/26/2017 at 9:33 am to Dr. Brantley Stage, who verbally acknowledged these results. Electronically Signed   By: Vinnie Langton M.D.   On: 04/26/2017 09:34    Procedures Procedures (including critical care time)  Medications Ordered in ED Medications - No data to display   Initial Impression / Assessment and Plan / ED Course  I have reviewed the triage vital signs and the nursing notes.  Pertinent labs & imaging results that were available during my care of the patient were reviewed  by me and considered in my medical decision making (see chart for details).     Concern for TIA vs CVA. Symptoms improving, but mild residual dysarthria and subjective weakness. No clear time of onset, as his symptoms present when he woke up from sleep.  On my evaluation, he complains of mild residual dysarthria as well as subjective right-sided weakness although improved.  No obvious drift on exam and remainder of his neuro exam is intact.  His CT head is visualized.  Reviewed with radiology who is concerned about potential attenuation in  the left internal capsule, which correlates with his symptoms.  An MRI is ordered.  Dr. Leonel Ramsay from neurology is consulted.  Spoke with hospitalist service who will admit for the remainder of his stroke workup.  Final Clinical Impressions(s) / ED Diagnoses   Final diagnoses:  Acute CVA (cerebrovascular accident) [facility])    ED Discharge Orders    None       Forde Dandy, MD 04/26/17 1028

## 2017-04-26 NOTE — ED Notes (Signed)
Patient transported to MRI 

## 2017-04-26 NOTE — ED Triage Notes (Signed)
Pt here from home with c/o right side coordination and some slurred speech, cbg 244 , pt is back to normal at present

## 2017-04-26 NOTE — Consult Note (Signed)
Neurology Consultation Reason for Consult: Right sided weakness and slurred speech Referring Physician: D.r Oleta Mouse  CC: Slurred speech and right sided weakness  History is obtained from: Patient  HPI: Jon [name] is a 61 y.o. male with a past medical history significant for non-insulin dependent diabetes mellitus type 2, hypertension, hyperlipidemia who presents to the ED today with complaints of right sided weakness and slurred speech. He reports that he went to bed between 10-11 PM last night in his normal state of health. Upon waking this morning at 6 AM he noted his right hand was weak and had difficultly walking. He also noted some slurred speech at that time. He attempted to go to work but was unable to work. He contacted his primary care provider who advised going to the emergency department for evaluation.  He reports that he has not seen any changes in his symptoms from this morning. He states that his speech is still slurred and his wife at bedside affirms that his speech is off. He also notes continued right hand, arm, and leg weakness. Denies any other symptoms today. No fevers, chills, nausea, vomiting, chest pain, palpitations, shortness of breath.  He does note losing insurance and being unable to obtain any of his medications for an extended period of time. A1c was previously fairly well controlled at 7.8 07/2015 but has been elevated over the past year with most recent in October being 9.8.   LKW: 10 PM 04/25/17 tpa given?: no Premorbid modified rankin scale: 0  ROS: A 14 point ROS was performed and is negative except as noted in the HPI.   Past Medical History:  Diagnosis Date  . Diabetes mellitus without complication ([place])    type II  . History of kidney stones   . Hyperlipidemia   . Hypertension   . Kidney stones   . Stomach ulcer     Family History  Problem Relation Age of Onset  . Diabetes Father   . Stroke Father   . Hypertension Father   . Hypertension Mother    . Diabetes Sister   . Diabetes Son   . Diabetes Daughter     Social History:  reports that  has never smoked. he has never used smokeless tobacco. He reports that he drinks about 0.6 - 1.2 oz of alcohol per week. He reports that he does not use drugs.  Exam: Current vital signs: BP (!) 153/92   Pulse 73   Temp 98.2 F (36.8 C) (Oral)   Resp 15   SpO2 99%  Vital signs in last 24 hours: Temp:  [98.2 F (36.8 C)] 98.2 F (36.8 C) (12/14 0848) Pulse Rate:  [67-82] 73 (12/14 1118) Resp:  [10-15] 15 (12/14 1118) BP: (140-163)/(85-96) 153/92 (12/14 1118) SpO2:  [99 %] 99 % (12/14 1118)   Physical Exam  Constitutional: Appears well-developed and well-nourished.  Psych: Affect appropriate to situation Eyes: No scleral injection HENT: No OP obstrucion Head: Normocephalic.  Cardiovascular: Normal rate and regular rhythm.  Respiratory: Effort normal, non-labored breathing GI: Soft.  No distension. There is no tenderness.  Skin: WDI  Neuro: Mental Status: Patient is awake, alert, oriented to person, place, month, year, and situation. Patient is able to give a clear and coherent history. No signs of aphasia or neglect. Mild dysarthria but easily understandable.  Cranial Nerves: II: Visual Fields are full. Pupils are equal, round, and reactive to light.  III,IV, VI: EOMI without ptosis or diploplia.  V: Facial sensation is symmetric to  temperature VII: Facial movement is symmetric.  VIII: hearing is intact to voice X: Uvula elevates symmetrically XI: Shoulder shrug is symmetric. XII: tongue is midline without atrophy or fasciculations.  Motor: Tone is normal. Bulk is normal.  4+/5 strength in RUE and RLE with mildly decreased right hand grip strength. 5/5 strength on left.  Sensory: Sensation is symmetric to light touch and temperature in the arms and legs. Deep Tendon Reflexes: 2+ and symmetric in the biceps and patellae.  Plantars: Toes are downgoing bilaterally.   Cerebellar: FNF and HKS are intact bilaterally though slowed on right compared to left  I have reviewed labs in epic   I have reviewed the images obtained: CT head  IMPRESSION: 1. Subtle slightly ill-defined area of low attenuation associated with the posterior limb of the left internal capsule which could reflect age-indeterminate ischemia. If there is strong clinical concern for acute infarction, this could be further evaluated with MRI of the brain without contrast. 2. Chronic microvascular ischemic changes in the cerebral white Matter.  MRI Brain: IMPRESSION: 1. Acute lacunar infarct in the posterior limb left internal capsule/lateral thalamus. 2. Chronic small vessel ischemia with remote small vessel infarcts described above.   Impression:  61 year old male with DM, HTN, HLD presenting with right sided weakness and slurred speech found to have acute lacunar infarct of posterior left internal capsule. He has several known risk factors including poorly controlled DM as well as HLD and HTN. Will need admission for further stoke work up. H  Recommendations: 1. HgbA1c, fasting lipid panel 2. MRA  of the brain without contrast 3. Frequent neuro checks 4. Echocardiogram 5. Carotid dopplers 6. Prophylactic therapy-Antiplatelet med: Aspirin - dose 325mg  PO or 300mg  PR 7. Risk factor modification 8. Telemetry monitoring 9. PT consult, OT consult, Speech consult 10. please page stroke NP  Or  PA  Or MD  from 8am -4 pm as this patient will be followed by the stroke team at this point.   You can look them up on [email or web address]     Maryellen Pile, MD Internal Medicine Teaching Program PGY-3

## 2017-04-26 NOTE — Plan of Care (Signed)
Pt ambulated to/from the bathroom without assistance. Pt has a steady gait

## 2017-04-26 NOTE — ED Notes (Signed)
admitting Provider at bedside. 

## 2017-04-26 NOTE — Telephone Encounter (Signed)
FYI

## 2017-04-26 NOTE — ED Notes (Signed)
Patient transported to CT 

## 2017-04-27 ENCOUNTER — Inpatient Hospital Stay (HOSPITAL_COMMUNITY): Payer: Self-pay

## 2017-04-27 DIAGNOSIS — E119 Type 2 diabetes mellitus without complications: Secondary | ICD-10-CM

## 2017-04-27 DIAGNOSIS — I63312 Cerebral infarction due to thrombosis of left middle cerebral artery: Secondary | ICD-10-CM

## 2017-04-27 DIAGNOSIS — I10 Essential (primary) hypertension: Secondary | ICD-10-CM

## 2017-04-27 DIAGNOSIS — E785 Hyperlipidemia, unspecified: Secondary | ICD-10-CM

## 2017-04-27 LAB — BASIC METABOLIC PANEL
Anion gap: 9 (ref 5–15)
BUN: 9 mg/dL (ref 6–20)
CALCIUM: 9.1 mg/dL (ref 8.9–10.3)
CO2: 26 mmol/L (ref 22–32)
Chloride: 102 mmol/L (ref 101–111)
Creatinine, Ser: 0.91 mg/dL (ref 0.61–1.24)
GFR calc Af Amer: >60 mL/min (ref >60)
GLUCOSE: 196 mg/dL — AB (ref 65–99)
POTASSIUM: 3.9 mmol/L (ref 3.5–5.1)
SODIUM: 137 mmol/L (ref 135–145)

## 2017-04-27 LAB — CBC
HCT: 37.5 % — ABNORMAL LOW (ref 39.0–52.0)
Hemoglobin: 11.7 g/dL — ABNORMAL LOW (ref 13.0–17.0)
MCH: 24.5 pg — AB (ref 26.0–34.0)
MCHC: 31.2 g/dL (ref 30.0–36.0)
MCV: 78.6 fL (ref 78.0–100.0)
PLATELETS: 245 10*3/uL (ref 150–400)
RBC: 4.77 MIL/uL (ref 4.22–5.81)
RDW: 15 % (ref 11.5–15.5)
WBC: 4.5 10*3/uL (ref 4.0–10.5)

## 2017-04-27 LAB — LIPID PANEL
Cholesterol: 136 mg/dL (ref 0–200)
HDL: 31 mg/dL — ABNORMAL LOW (ref >40)
LDL Cholesterol: 83 mg/dL (ref 0–99)
Total CHOL/HDL Ratio: 4.4 RATIO
Triglycerides: 108 mg/dL (ref <150)
VLDL: 22 mg/dL (ref 0–40)

## 2017-04-27 LAB — GLUCOSE, CAPILLARY
GLUCOSE-CAPILLARY: 251 mg/dL — AB (ref 65–99)
GLUCOSE-CAPILLARY: 261 mg/dL — AB (ref 65–99)

## 2017-04-27 LAB — TSH: TSH: 1.604 u[IU]/mL (ref 0.350–4.500)

## 2017-04-27 LAB — VITAMIN B12: Vitamin B-12: 690 pg/mL (ref 180–914)

## 2017-04-27 LAB — HIV ANTIBODY (ROUTINE TESTING W REFLEX): HIV SCREEN 4TH GENERATION: NONREACTIVE

## 2017-04-27 MED ORDER — CLOPIDOGREL BISULFATE 75 MG PO TABS: 75.0000 mg | ORAL_TABLET | Freq: Every day | ORAL | 0 refills | Status: DC

## 2017-04-27 MED ORDER — ATORVASTATIN CALCIUM 80 MG PO TABS: 80.0000 mg | ORAL_TABLET | Freq: Every day | ORAL | Status: DC

## 2017-04-27 MED ORDER — INSULIN ASPART 100 UNIT/ML ~~LOC~~ SOLN: 0.0000 [IU] | Freq: Every day | SUBCUTANEOUS | Status: DC

## 2017-04-27 MED ORDER — CLOPIDOGREL BISULFATE 75 MG PO TABS
75.0000 mg | ORAL_TABLET | Freq: Every day | ORAL | Status: DC
  Administered 2017-04-27: 75 mg via ORAL
  Filled 2017-04-27: qty 1

## 2017-04-27 MED ORDER — INSULIN ASPART 100 UNIT/ML ~~LOC~~ SOLN
0.0000 [IU] | Freq: Three times a day (TID) | SUBCUTANEOUS | Status: DC
  Administered 2017-04-27 (×2): 5 [IU] via SUBCUTANEOUS

## 2017-04-27 NOTE — Progress Notes (Addendum)
Physician Discharge Summary  Jon [name] VHQ:[id number] DOB: 08/31/1955 DOA: 04/26/2017  PCP: Biagio Borg, MD  Admit date: 04/26/2017 Discharge date: 04/27/2017  Admitted From: home  Disposition:  home   Recommendations for Outpatient Follow-up:  1. F/u A1c which was done in the hospital.  2. F/u LDL 3. Metformin can be resumed in 2 days (had CTA)     Discharge Condition: stable CODE STATUS:  Full Code   Consultations:  stable    Discharge Diagnoses:  Principal Problem:   CVA (cerebral vascular accident) ([place]) Active Problems:   Diabetes ([place])   Dyslipidemia   HTN (hypertension)    Subjective: He feels that the weakness in the right sided has improved but he still is having trouble with things such as holding a fork and texting on his phone. He feels his speech has improved as well but still feels it may be a bit slurred. I do not appreciate this on exam.   HPI: Jon [name] is a 61 y.o. male with medical history significant of Diabetes, Hypertension and Hyperlipidemia.  Pt presented to the Emergency department this morning complaining of his right hand not working. Pt awoke around 6am today. Pt was using a computer and was unable to click of uses keys.  Pt noticed difficulty walking.  He felt as if he was dragging his right leg.  Pt reports difficulty speaking.  Pt had some slurred speech.  Pt went to work but was unable to work.  Family Md advised pt to go to the Emergency department for evaluation.   Pt reports symptoms are improving. Weakness and speech are improving.   Pt denies fever, no nausea or vomiting. No chest pain, no shortness of breath.  Pt felt well yesterday and was normal at bedtime.      Hospital Course:  CVA - MRI: Acute lacunar infarct in the posterior limb left internal capsule/lateral thalamus. - CTA head and neck shows atherosclerosis and some stenosis- see report below - LDL: 83- Lipitor dose should be increased to get LDL < 70  - A1c:  pending - PT eval>> no PT follow up recommended - OT eval>> therapeutic exercises given to improve coordination in right hand- no other therapy needed- I have spoken with the therapist just now about this - ASA is being changed to Plavix and Lipitor is being increased  DM2 - cont Amaryl and Metformin - need f/u with PCP- Aba1c is still  Pending - last A1c in Oct was 9.8  HTN - Norvasc - Lisinopril  HLD - Lipitor  Discharge Instructions  Discharge Instructions    Ambulatory referral to Neurology   Complete by:  As directed    Follow up with stroke clinic Cecille Rubin preferred, if not available, then consider [facility], [facility] or Jaynee Eagles whoever is available) at [facility] in about 6-8 weeks. Thanks.   Ambulatory referral to Speech Therapy   Complete by:  As directed    If ordering provider is a resident, enter attending physician's name:  Shruthi Northrup   Diet - low sodium heart healthy   Complete by:  As directed    Diet Carb Modified   Complete by:  As directed    Increase activity slowly   Complete by:  As directed      Allergies as of 04/27/2017   No Known Allergies     Medication List    STOP taking these medications   aspirin 81 MG chewable tablet   metFORMIN 500 MG 24 hr  tablet Commonly known as:  GLUCOPHAGE-XR     TAKE these medications   amLODipine 10 MG tablet Commonly known as:  NORVASC Take 1 tablet (10 mg total) daily by mouth.   atorvastatin 40 MG tablet Commonly known as:  LIPITOR Take 1 tablet (40 mg total) by mouth daily.   clopidogrel 75 MG tablet Commonly known as:  PLAVIX Take 1 tablet (75 mg total) by mouth daily. Start taking on:  04/28/2017   glimepiride 4 MG tablet Commonly known as:  AMARYL Take 2 tablets (8 mg total) daily with breakfast by mouth.   glucose blood test strip Commonly known as:  ONETOUCH VERIO 1 each by Other route daily. And lancets 1/day   lisinopril 40 MG tablet Commonly known as:  PRINIVIL,ZESTRIL TAKE 1 TABLET  BY MOUTH DAILY   ONETOUCH DELICA LANCETS 28U Misc Use to check blood sugar 1 time per day. DX Code: E11.9   ONETOUCH VERIO w/Device Kit 1 Device by Does not apply route once.   tamsulosin 0.4 MG Caps capsule Commonly known as:  FLOMAX Take 1 capsule (0.4 mg total) by mouth daily.      Follow-up Information    Dennie Bible, NP. Schedule an appointment as soon as possible for a visit in 6 week(s).   Specialty:  Family Medicine Contact information: [address] [place] [address] [phone]        Biagio Borg, MD Follow up in 1 week(s).   Specialties:  Internal Medicine, Radiology Why:  please see your PCP in 1 wk Contact information: [place] [address] [phone]        [place] Follow up.   Specialty:  Rehabilitation Why:  They will call you for speech therapy appointment. Contact information: [place] [phone] [place] [address] [place] Follow up.   Why:  Call for appointment on Monday or go to office Thursday morning.  You may use this pharmacy when you establish primary care at clinic. Medications will be $4-$10. Contact information: [place] [id number] [place] Follow up.   Specialty:  Internal Medicine Why:  Call for appointment for primary MD. Contact information: [place] [phone]         No Known Allergies   Procedures/Studies: 2 D ECHO Left ventricle: The cavity size was normal. Wall thickness was   increased in a pattern of mild LVH. Systolic function was normal.   The estimated ejection fraction was in the range of 60% to 65%.   Wall motion was normal; there were no regional wall motion   abnormalities. Doppler parameters are  consistent with abnormal   left ventricular relaxation (grade 1 diastolic dysfunction). - Aortic valve: Trileaflet; mildly thickened, mildly calcified   leaflets. - Aorta: Aortic root dimension: 41 mm (ED). - Ascending aorta: The ascending aorta was mildly dilated. - Mitral valve: Calcified annulus.  Impressions:  - No cardiac source of emboli was indentified.   Ct Angio Head W Or Wo Contrast  Result Date: 04/27/2017 CLINICAL DATA:  Follow-up examination for acute stroke. EXAM: CT ANGIOGRAPHY HEAD AND NECK TECHNIQUE: Multidetector CT imaging of the head and neck was performed using the standard protocol during bolus administration of intravenous contrast. Multiplanar CT image reconstructions and  MIPs were obtained to evaluate the vascular anatomy. Carotid stenosis measurements (when applicable) are obtained utilizing NASCET criteria, using the distal internal carotid diameter as the denominator. CONTRAST:  60m ISOVUE-370 IOPAMIDOL (ISOVUE-370) INJECTION 76% COMPARISON:  Prior CT and MRI from 04/26/2017. FINDINGS: CTA NECK FINDINGS Aortic arch: Visualized aortic arch normal in caliber or with normal 3 vessel morphology. Minimal plaque within the arch itself. No flow-limiting stenosis about the origin of the great vessels. Visualized subclavian artery is widely patent. Right carotid system: Right common carotid artery widely patent to the bifurcation. Mild mixed plaque about the right bifurcation/proximal right ICA with relatively mild stenosis of up to 35% by NASCET criteria. Right ICA widely patent distally to the skullbase without stenosis, dissection, or occlusion. Left carotid system: Left common carotid artery patent from its origin to the bifurcation. Irregularity with superimposed small focal outpouching at the origin of the left ICA reflects penetrating plaque (series 7, image 197). Short-segment mild stenosis of up to 30% by NASCET criteria just distally within the proximal left ICA. Left  ICA widely patent distally to the skullbase without stenosis, dissection, or occlusion. Vertebral arteries: Both of the vertebral artery is arise from the subclavian arteries. Left vertebral artery dominant. Pre foraminal left V1 segment tortuous with associated stenosis of up to approximately 70% (series 8, image 172). Additional multifocal mild to moderate stenoses present within the left V2 segment, most notable at the level of C5 (series 7, image 204). Left vertebral artery otherwise patent to the skullbase without stenosis or occlusion. Right vertebral artery diminutive bone widely patent to the skullbase without hemodynamically significant stenosis. Skeleton: No acute osseus abnormality. No worrisome lytic or blastic osseous lesions. Moderate cervical spondylolysis, most evident at C4-5 and C5-6. Other neck: No acute soft tissue abnormality within the neck. Salivary glands normal. Thyroid normal. No adenopathy. Upper chest: Visualized upper chest within normal limits. Partially visualized lungs are grossly clear. Review of the MIP images confirms the above findings CTA HEAD FINDINGS Anterior circulation: Internal carotid artery is patent to the termini without flow-limiting stenosis. A1 segments widely patent. Normal anterior communicating artery. Atheromatous irregularity within the ACAs without high-grade flow-limiting stenosis. M1 segments demonstrate mild atheromatous irregularity without flow-limiting stenosis. Normal MCA bifurcations. No proximal M2 occlusion. Distal small vessel irregularity within the MCA branches bilaterally. Posterior circulation: Dominant left V4 segment patent to the vertebrobasilar junction without flow-limiting stenosis. Right V4 segment demonstrates mild multifocal narrowing without high-grade stenosis. Basilar artery widely patent to its distal aspect. Superior cerebral arteries patent bilaterally. Both of the posterior cerebral arteries primarily supplied via the basilar.  Small right posterior communicating artery noted. No flow-limiting or proximal PCA stenosis. Distal small vessel atheromatous irregularity. Venous sinuses: Grossly patent. Anatomic variants: None significant. No aneurysm or vascular abnormality. Delayed phase: No abnormal enhancement. Small evolving acute ischemic left internal capsule/lateral left thalamus infarct noted. Review of the MIP images confirms the above findings IMPRESSION: 1. Negative CTA for large vessel occlusion. 2. Mild atheromatous stenoses of approximately 30-35% by NASCET criteria within the proximal ICAs bilaterally. 3. Short-segment severe 70% pre foraminal left V1 stenosis, with additional mild to moderate multifocal left V2 stenoses. Left vertebral artery is dominant. Diminutive right vertebral artery widely patent. 4. Distal small vessel atheromatous irregularity throughout the intracranial circulation. Electronically Signed   By: BJeannine BogaM.D.   On: 04/27/2017 04:12   Ct Head Wo Contrast  Result Date: 04/26/2017 CLINICAL DATA:  61year old male with history of right-sided leg and arm weakness today.  Slurred speech. EXAM: CT HEAD WITHOUT CONTRAST TECHNIQUE: Contiguous axial images were obtained from the base of the skull through the vertex without intravenous contrast. COMPARISON:  None. FINDINGS: Brain: Slightly ill-defined area of low attenuation associated with the posterior limb of the left internal capsule (axial image 16 of series 3) could reflect age-indeterminate ischemia. Patchy and confluent areas of decreased attenuation are noted throughout the deep and periventricular white matter of the cerebral hemispheres bilaterally, compatible with chronic microvascular ischemic disease. No evidence of acute hemorrhage, hydrocephalus, extra-axial collection or mass lesion/mass effect. Vascular: No hyperdense vessel or unexpected calcification. Skull: Normal. Negative for fracture or focal lesion. Sinuses/Orbits: Mild  multifocal mucosal thickening in the paranasal sinuses without air-fluid levels. No acute finding. Other: None. IMPRESSION: 1. Subtle slightly ill-defined area of low attenuation associated with the posterior limb of the left internal capsule which could reflect age-indeterminate ischemia. If there is strong clinical concern for acute infarction, this could be further evaluated with MRI of the brain without contrast. 2. Chronic microvascular ischemic changes in the cerebral white matter. These results were called by telephone at the time of interpretation on 04/26/2017 at 9:33 am to Dr. Brantley Stage, who verbally acknowledged these results. Electronically Signed   By: Vinnie Langton M.D.   On: 04/26/2017 09:34   Ct Angio Neck W Or Wo Contrast  Result Date: 04/27/2017 CLINICAL DATA:  Follow-up examination for acute stroke. EXAM: CT ANGIOGRAPHY HEAD AND NECK TECHNIQUE: Multidetector CT imaging of the head and neck was performed using the standard protocol during bolus administration of intravenous contrast. Multiplanar CT image reconstructions and MIPs were obtained to evaluate the vascular anatomy. Carotid stenosis measurements (when applicable) are obtained utilizing NASCET criteria, using the distal internal carotid diameter as the denominator. CONTRAST:  32m ISOVUE-370 IOPAMIDOL (ISOVUE-370) INJECTION 76% COMPARISON:  Prior CT and MRI from 04/26/2017. FINDINGS: CTA NECK FINDINGS Aortic arch: Visualized aortic arch normal in caliber or with normal 3 vessel morphology. Minimal plaque within the arch itself. No flow-limiting stenosis about the origin of the great vessels. Visualized subclavian artery is widely patent. Right carotid system: Right common carotid artery widely patent to the bifurcation. Mild mixed plaque about the right bifurcation/proximal right ICA with relatively mild stenosis of up to 35% by NASCET criteria. Right ICA widely patent distally to the skullbase without stenosis, dissection, or  occlusion. Left carotid system: Left common carotid artery patent from its origin to the bifurcation. Irregularity with superimposed small focal outpouching at the origin of the left ICA reflects penetrating plaque (series 7, image 197). Short-segment mild stenosis of up to 30% by NASCET criteria just distally within the proximal left ICA. Left ICA widely patent distally to the skullbase without stenosis, dissection, or occlusion. Vertebral arteries: Both of the vertebral artery is arise from the subclavian arteries. Left vertebral artery dominant. Pre foraminal left V1 segment tortuous with associated stenosis of up to approximately 70% (series 8, image 172). Additional multifocal mild to moderate stenoses present within the left V2 segment, most notable at the level of C5 (series 7, image 204). Left vertebral artery otherwise patent to the skullbase without stenosis or occlusion. Right vertebral artery diminutive bone widely patent to the skullbase without hemodynamically significant stenosis. Skeleton: No acute osseus abnormality. No worrisome lytic or blastic osseous lesions. Moderate cervical spondylolysis, most evident at C4-5 and C5-6. Other neck: No acute soft tissue abnormality within the neck. Salivary glands normal. Thyroid normal. No adenopathy. Upper chest: Visualized upper chest within normal limits. Partially visualized  lungs are grossly clear. Review of the MIP images confirms the above findings CTA HEAD FINDINGS Anterior circulation: Internal carotid artery is patent to the termini without flow-limiting stenosis. A1 segments widely patent. Normal anterior communicating artery. Atheromatous irregularity within the ACAs without high-grade flow-limiting stenosis. M1 segments demonstrate mild atheromatous irregularity without flow-limiting stenosis. Normal MCA bifurcations. No proximal M2 occlusion. Distal small vessel irregularity within the MCA branches bilaterally. Posterior circulation: Dominant  left V4 segment patent to the vertebrobasilar junction without flow-limiting stenosis. Right V4 segment demonstrates mild multifocal narrowing without high-grade stenosis. Basilar artery widely patent to its distal aspect. Superior cerebral arteries patent bilaterally. Both of the posterior cerebral arteries primarily supplied via the basilar. Small right posterior communicating artery noted. No flow-limiting or proximal PCA stenosis. Distal small vessel atheromatous irregularity. Venous sinuses: Grossly patent. Anatomic variants: None significant. No aneurysm or vascular abnormality. Delayed phase: No abnormal enhancement. Small evolving acute ischemic left internal capsule/lateral left thalamus infarct noted. Review of the MIP images confirms the above findings IMPRESSION: 1. Negative CTA for large vessel occlusion. 2. Mild atheromatous stenoses of approximately 30-35% by NASCET criteria within the proximal ICAs bilaterally. 3. Short-segment severe 70% pre foraminal left V1 stenosis, with additional mild to moderate multifocal left V2 stenoses. Left vertebral artery is dominant. Diminutive right vertebral artery widely patent. 4. Distal small vessel atheromatous irregularity throughout the intracranial circulation. Electronically Signed   By: Jeannine Boga M.D.   On: 04/27/2017 04:12   Mr Brain Wo Contrast  Result Date: 04/26/2017 CLINICAL DATA:  Right-sided weakness. EXAM: MRI HEAD WITHOUT CONTRAST TECHNIQUE: Multiplanar, multiecho pulse sequences of the brain and surrounding structures were obtained without intravenous contrast. COMPARISON:  Head CT from earlier today FINDINGS: Brain: ~1 cm acute infarct in the left posterior limb internal capsule/lateral thalamus. Chronic microvascular ischemic type gliosis in the cerebral white matter. Remote small vessel infarcts in the bilateral cerebellum, left thalamus, and left pons. No hemorrhage, hydrocephalus, or masslike finding. Vascular: Major flow voids  are preserved Skull and upper cervical spine: Negative for marrow lesion Sinuses/Orbits: Mild patchy mucosal thickening in the paranasal sinuses, likely with superimposed retention cysts. IMPRESSION: 1. Acute lacunar infarct in the posterior limb left internal capsule/lateral thalamus. 2. Chronic small vessel ischemia with remote small vessel infarcts described above. Electronically Signed   By: Monte Fantasia M.D.   On: 04/26/2017 11:08       Discharge Exam: Vitals:   04/27/17 1200 04/27/17 1600  BP: (!) 141/89 (!) 162/87  Pulse: 77 76  Resp: 18 16  Temp: (!) 97.4 F (36.3 C)   SpO2: 97% 98%   Vitals:   04/27/17 0415 04/27/17 0800 04/27/17 1200 04/27/17 1600  BP: 114/74 115/66 (!) 141/89 (!) 162/87  Pulse:  69 77 76  Resp:  '11 18 16  '$ Temp: 97.8 F (36.6 C) 97.6 F (36.4 C) (!) 97.4 F (36.3 C)   TempSrc: Oral Oral Oral   SpO2:  94% 97% 98%  Weight:      Height:        General: Pt is alert, awake, not in acute distress Cardiovascular: RRR, S1/S2 +, no rubs, no gallops Respiratory: CTA bilaterally, no wheezing, no rhonchi Abdominal: Soft, NT, ND, bowel sounds + Extremities: no edema, no cyanosis    The results of significant diagnostics from this hospitalization (including imaging, microbiology, ancillary and laboratory) are listed below for reference.     Microbiology: Recent Results (from the past 240 hour(s))  MRSA PCR Screening  Status: None   Collection Time: 04/26/17  4:47 PM  Result Value Ref Range Status   MRSA by PCR NEGATIVE NEGATIVE Final    Comment:        The GeneXpert MRSA Assay (FDA approved for NASAL specimens only), is one component of a comprehensive MRSA colonization surveillance program. It is not intended to diagnose MRSA infection nor to guide or monitor treatment for MRSA infections.      Labs: BNP (last 3 results) No results for input(s): BNP in the last 8760 hours. Basic Metabolic Panel: Recent Labs  Lab 04/26/17 0907  04/26/17 0919 04/26/17 1040 04/27/17 0531  NA 137 140  --  137  K 4.3 4.2  --  3.9  CL 105 102  --  102  CO2 23  --   --  26  GLUCOSE 287* 291*  --  196*  BUN 9 10  --  9  CREATININE 0.91 0.70 0.87 0.91  CALCIUM 9.1  --   --  9.1   Liver Function Tests: Recent Labs  Lab 04/26/17 0907  AST 25  ALT 20  ALKPHOS 59  BILITOT 0.4  PROT 7.0  ALBUMIN 3.8   No results for input(s): LIPASE, AMYLASE in the last 168 hours. No results for input(s): AMMONIA in the last 168 hours. CBC: Recent Labs  Lab 04/26/17 0907 04/26/17 0919 04/26/17 1040 04/27/17 0531  WBC 4.8  --  4.7 4.5  NEUTROABS 3.0  --   --   --   HGB 11.9* 13.3 11.7* 11.7*  HCT 37.8* 39.0 37.4* 37.5*  MCV 79.2  --  79.1 78.6  PLT 235  --  246 245   Cardiac Enzymes: No results for input(s): CKTOTAL, CKMB, CKMBINDEX, TROPONINI in the last 168 hours. BNP: Invalid input(s): POCBNP CBG: Recent Labs  Lab 04/27/17 1158  GLUCAP 251*   D-Dimer No results for input(s): DDIMER in the last 72 hours. Hgb A1c No results for input(s): HGBA1C in the last 72 hours. Lipid Profile Recent Labs    04/27/17 0531  CHOL 136  HDL 31*  LDLCALC 83  TRIG 108  CHOLHDL 4.4   Thyroid function studies Recent Labs    04/27/17 0531  TSH 1.604   Anemia work up Recent Labs    04/27/17 0531  VITAMINB12 690   Urinalysis    Component Value Date/Time   COLORURINE YELLOW 04/26/2017 0913   APPEARANCEUR CLEAR 04/26/2017 0913   LABSPEC 1.016 04/26/2017 0913   PHURINE 6.0 04/26/2017 0913   GLUCOSEU >=500 (A) 04/26/2017 0913   HGBUR NEGATIVE 04/26/2017 0913   BILIRUBINUR NEGATIVE 04/26/2017 0913   KETONESUR NEGATIVE 04/26/2017 0913   PROTEINUR NEGATIVE 04/26/2017 0913   NITRITE NEGATIVE 04/26/2017 0913   LEUKOCYTESUR NEGATIVE 04/26/2017 0913   Sepsis Labs Invalid input(s): PROCALCITONIN,  WBC,  LACTICIDVEN Microbiology Recent Results (from the past 240 hour(s))  MRSA PCR Screening     Status: None   Collection Time:  04/26/17  4:47 PM  Result Value Ref Range Status   MRSA by PCR NEGATIVE NEGATIVE Final    Comment:        The GeneXpert MRSA Assay (FDA approved for NASAL specimens only), is one component of a comprehensive MRSA colonization surveillance program. It is not intended to diagnose MRSA infection nor to guide or monitor treatment for MRSA infections.      Time coordinating discharge: Over 30 minutes  SIGNED:   Debbe Odea, MD  [facility] 04/27/2017, 6:41 PM Pager  If 7PM-7AM, please contact night-coverage [email or web address] Password [email or web address]

## 2017-04-27 NOTE — Care Management Note (Signed)
Case Management Note  Patient Details  Name: Jon [name] MRN: [id number] Date of Birth: April 05, 1956  Subjective/Objective:             Pt presented for CVA.    Referred for  OP SLP.  Pt uninsured and paying out of pocket for PMD at [employer].  Pt states he cannot afford this.   Action/Plan: Pt referred to OP Neuro rehab.  Pt given information for [facility] and [facility].  Advised pt to call for appointment Monday at both places or go to open clinic on Thursday morning.   Expected Discharge Date:  04/27/17               Expected Discharge Plan:  Home/Self Care  In-House Referral:  NA  Discharge planning Services  CM Consult, [facility]  Post Acute Care Choice:  NA Choice offered to:     DME Arranged:    DME Agency:     HH Arranged:    HH Agency:     Status of Service:  Completed, signed off  If discussed at [employer], dates discussed:    Additional Comments:  Arley Phenix, RN 04/27/2017, 4:58 PM

## 2017-04-27 NOTE — Discharge Instructions (Signed)
Do not take Metformin for 2 days and then you can resume it.  To prevent another stroke, control your diabetes, cholesterol, BP and weight and begin an exercise program.  Please ask you PCP what your AIc is. If it is high, you may need to be started on Insulin  Please take all your medications with you for your next visit with your Primary MD. Please request your Primary MD to go over all hospital test results at the follow up.  Please ask your Primary MD to get all Hospital records sent to his/her office.  If you experience worsening of your admission symptoms, develop shortness of breath, chest pain, suicidal or homicidal thoughts or a life threatening emergency, you must seek medical attention immediately by calling 911 or calling your MD.  Jon [name] must read the complete instructions/literature along with all the possible adverse reactions/side effects for all the medicines you take including new medications that have been prescribed to you. Take new medicines after you have completely understood and accpet all the possible adverse reactions/side effects.   Do not drive when taking pain medications or sedatives.    Do not take more than prescribed Pain, Sleep and Anxiety Medications  If you have smoked or chewed Tobacco in the last 2 yrs please stop. Stop any regular alcohol and or recreational drug use.  Wear Seat belts while driving.    Stroke Prevention Some medical conditions and behaviors are associated with an increased chance of having a stroke. You may prevent a stroke by making healthy choices and managing medical conditions. How can I reduce my risk of having a stroke?  Stay physically active. Get at least 30 minutes of activity on most or all days.  Do not smoke. It may also be helpful to avoid exposure to secondhand smoke.  Limit alcohol use. Moderate alcohol use is considered to be: ? No more than 2 drinks per day for men. ? No more than 1 drink per day for  nonpregnant women.  Eat healthy foods. This involves: ? Eating 5 or more servings of fruits and vegetables a day. ? Making dietary changes that address high blood pressure (hypertension), high cholesterol, diabetes, or obesity.  Manage your cholesterol levels. ? Making food choices that are high in fiber and low in saturated fat, trans fat, and cholesterol may control cholesterol levels. ? Take any prescribed medicines to control cholesterol as directed by your health care provider.  Manage your diabetes. ? Controlling your carbohydrate and sugar intake is recommended to manage diabetes. ? Take any prescribed medicines to control diabetes as directed by your health care provider.  Control your hypertension. ? Making food choices that are low in salt (sodium), saturated fat, trans fat, and cholesterol is recommended to manage hypertension. ? Ask your health care provider if you need treatment to lower your blood pressure. Take any prescribed medicines to control hypertension as directed by your health care provider. ? If you are 39-28 years of age, have your blood pressure checked every 3-5 years. If you are 24 years of age or older, have your blood pressure checked every year.  Maintain a healthy weight. ? Reducing calorie intake and making food choices that are low in sodium, saturated fat, trans fat, and cholesterol are recommended to manage weight.  Stop drug abuse.  Avoid taking birth control pills. ? Talk to your health care provider about the risks of taking birth control pills if you are over 34 years old, smoke, get migraines, or  have ever had a blood clot.  Get evaluated for sleep disorders (sleep apnea). ? Talk to your health care provider about getting a sleep evaluation if you snore a lot or have excessive sleepiness.  Take medicines only as directed by your health care provider. ? For some people, aspirin or blood thinners (anticoagulants) are helpful in reducing the risk  of forming abnormal blood clots that can lead to stroke. If you have the irregular heart rhythm of atrial fibrillation, you should be on a blood thinner unless there is a good reason you cannot take them. ? Understand all your medicine instructions.  Make sure that other conditions (such as anemia or atherosclerosis) are addressed. Get help right away if:  You have sudden weakness or numbness of the face, arm, or leg, especially on one side of the body.  Your face or eyelid droops to one side.  You have sudden confusion.  You have trouble speaking (aphasia) or understanding.  You have sudden trouble seeing in one or both eyes.  You have sudden trouble walking.  You have dizziness.  You have a loss of balance or coordination.  You have a sudden, severe headache with no known cause.  You have new chest pain or an irregular heartbeat. Any of these symptoms may represent a serious problem that is an emergency. Do not wait to see if the symptoms will go away. Get medical help at once. Call your local emergency services (911 in [place] Do not drive yourself to the hospital. This information is not intended to replace advice given to you by your health care provider. Make sure you discuss any questions you have with your health care provider. Document Released: 06/07/2004 Document Revised: 10/06/2015 Document Reviewed: 10/31/2012 Elsevier Interactive Patient Education  2017 [employer].

## 2017-04-27 NOTE — Progress Notes (Addendum)
Nutrition Brief Note  Patient identified on the Malnutrition Screening Tool (MST) Report.  Wt Readings from Last 15 Encounters:  04/26/17 192 lb 3.9 oz (87.2 kg)  03/13/17 196 lb (88.9 kg)  03/01/17 195 lb (88.5 kg)  12/10/16 195 lb (88.5 kg)  12/02/16 198 lb (89.8 kg)  10/27/16 198 lb 8 oz (90 kg)  06/04/16 211 lb (95.7 kg)  03/05/16 199 lb (90.3 kg)  02/24/16 200 lb (90.7 kg)  12/26/15 205 lb 12.8 oz (93.4 kg)  08/01/15 198 lb (89.8 kg)  04/12/15 200 lb (90.7 kg)  03/17/15 198 lb 1.9 oz (89.9 kg)  03/10/15 203 lb (92.1 kg)   Body mass index is 27.58 kg/m. Patient meets criteria for Overweight based on current BMI.   Current diet order is Heart Healthy/Carbohydrate Modified.   Pt reports yesterday he had some vomiting after a meal, however, this am he feels better and ate well.  Labs and medications reviewed. CBG 251. No recent weight loss reported.  If nutrition issues arise, please consult RD.   Arthur Holms, RD, LDN Pager #: [phone] After-Hours Pager #: [phone]

## 2017-04-27 NOTE — Progress Notes (Signed)
STROKE TEAM PROGRESS NOTE   SUBJECTIVE (INTERVAL HISTORY) No family is at the bedside.  His right sided weakness much improved and now only has mild right hand dexterity difficulty. He said his BP and glucose at home controlled better than before. However, his glucose in hospital was still high.    OBJECTIVE Temp:  [97.6 F (36.4 C)-98.2 F (36.8 C)] 97.6 F (36.4 C) (12/15 0800) Pulse Rate:  [67-83] 69 (12/15 0800) Cardiac Rhythm: Normal sinus rhythm (12/15 0800) Resp:  [11-14] 11 (12/15 0800) BP: (114-157)/(66-89) 115/66 (12/15 0800) SpO2:  [94 %-99 %] 94 % (12/15 0800) Weight:  [192 lb 3.9 oz (87.2 kg)] 192 lb 3.9 oz (87.2 kg) (12/14 1646)  CBC:  Recent Labs  Lab 04/26/17 0907  04/26/17 1040 04/27/17 0531  WBC 4.8  --  4.7 4.5  NEUTROABS 3.0  --   --   --   HGB 11.9*   < > 11.7* 11.7*  HCT 37.8*   < > 37.4* 37.5*  MCV 79.2  --  79.1 78.6  PLT 235  --  246 245   < > = values in this interval not displayed.    Basic Metabolic Panel:  Recent Labs  Lab 04/26/17 0907 04/26/17 0919 04/26/17 1040 04/27/17 0531  NA 137 140  --  137  K 4.3 4.2  --  3.9  CL 105 102  --  102  CO2 23  --   --  26  GLUCOSE 287* 291*  --  196*  BUN 9 10  --  9  CREATININE 0.91 0.70 0.87 0.91  CALCIUM 9.1  --   --  9.1    Lipid Panel:     Component Value Date/Time   CHOL 136 04/27/2017 0531   TRIG 108 04/27/2017 0531   HDL 31 (L) 04/27/2017 0531   CHOLHDL 4.4 04/27/2017 0531   VLDL 22 04/27/2017 0531   LDLCALC 83 04/27/2017 0531   HgbA1c:  Lab Results  Component Value Date   HGBA1C 9.8 (H) 03/13/2017   Urine Drug Screen:     Component Value Date/Time   LABOPIA NONE DETECTED 04/26/2017 0913   COCAINSCRNUR NONE DETECTED 04/26/2017 0913   LABBENZ NONE DETECTED 04/26/2017 0913   AMPHETMU NONE DETECTED 04/26/2017 0913   THCU NONE DETECTED 04/26/2017 0913   LABBARB NONE DETECTED 04/26/2017 0913    Alcohol Level     Component Value Date/Time   ETH <10 04/26/2017 0907     IMAGING I have personally reviewed the radiological images below and agree with the radiology interpretations.  Ct Angio Head W Or Wo Contrast Ct Angio Neck W Or Wo Contrast 04/27/2017 IMPRESSION:  1. Negative CTA for large vessel occlusion.  2. Mild atheromatous stenoses of approximately 30-35% by NASCET criteria within the proximal ICAs bilaterally.  3. Short-segment severe 70% pre foraminal left V1 stenosis, with additional mild to moderate multifocal left V2 stenoses. Left vertebral artery is dominant. Diminutive right vertebral artery widely patent.  4. Distal small vessel atheromatous irregularity throughout the intracranial circulation.  Ct Head Wo Contrast 04/26/2017 IMPRESSION:  1. Subtle slightly ill-defined area of low attenuation associated with the posterior limb of the left internal capsule which could reflect age-indeterminate ischemia. If there is strong clinical concern for acute infarction, this could be further evaluated with MRI of the brain without contrast.  2. Chronic microvascular ischemic changes in the cerebral white matter.   Mr Brain Wo Contrast 04/26/2017 IMPRESSION:  1. Acute lacunar infarct in the  posterior limb left internal capsule/lateral thalamus.  2. Chronic small vessel ischemia with remote small vessel infarcts described above.   Transthoracic Echocardiogram  - Left ventricle: The cavity size was normal. Wall thickness was   increased in a pattern of mild LVH. Systolic function was normal.   The estimated ejection fraction was in the range of 60% to 65%.   Wall motion was normal; there were no regional wall motion   abnormalities. Doppler parameters are consistent with abnormal   left ventricular relaxation (grade 1 diastolic dysfunction). - Aortic valve: Trileaflet; mildly thickened, mildly calcified   leaflets. - Aorta: Aortic root dimension: 41 mm (ED). - Ascending aorta: The ascending aorta was mildly dilated. - Mitral valve:  Calcified annulus. Impressions: - No cardiac source of emboli was indentified.    PHYSICAL EXAM Vitals:   04/26/17 2129 04/27/17 0000 04/27/17 0415 04/27/17 0800  BP:  127/78 114/74 115/66  Pulse:  83  69  Resp:  13  11  Temp: 98.2 F (36.8 C) 97.9 F (36.6 C) 97.8 F (36.6 C) 97.6 F (36.4 C)  TempSrc: Oral Oral Oral Oral  SpO2:  95%  94%  Weight:      Height:        Temp:  [97.4 F (36.3 C)-98.2 F (36.8 C)] 97.4 F (36.3 C) (12/15 1200) Pulse Rate:  [67-83] 77 (12/15 1200) Resp:  [11-18] 18 (12/15 1200) BP: (114-157)/(66-89) 141/89 (12/15 1200) SpO2:  [94 %-99 %] 97 % (12/15 1200) Weight:  [192 lb 3.9 oz (87.2 kg)] 192 lb 3.9 oz (87.2 kg) (12/14 1646)  General - Well nourished, well developed, in no apparent distress.  Ophthalmologic - Sharp disc margins OU.   Cardiovascular - Regular rate and rhythm.  Mental Status -  Level of arousal and orientation to time, place, and person were intact. Language including expression, naming, repetition, comprehension was assessed and found intact. Attention span and concentration were normal. Fund of Knowledge was assessed and was intact.  Cranial Nerves II - XII - II - Visual field intact OU. III, IV, VI - Extraocular movements intact. V - Facial sensation intact bilaterally. VII - Facial movement intact bilaterally. VIII - Hearing & vestibular intact bilaterally. X - Palate elevates symmetrically. XI - Chin turning & shoulder shrug intact bilaterally. XII - Tongue protrusion intact.  Motor Strength - The patient's strength was normal in all extremities except right hand mild dexterity difficulty and pronator drift was seen slightly on the right.  Bulk was normal and fasciculations were absent.   Motor Tone - Muscle tone was assessed at the neck and appendages and was normal.  Reflexes - The patient's reflexes were 1+ in all extremities and he had no pathological reflexes.  Sensory - Light touch,  temperature/pinprick were assessed and were symmetrical.    Coordination - The patient had normal movements in the hands with no ataxia or dysmetria.  Tremor was absent.  Gait and Station - deferred   ASSESSMENT/PLAN [name] is a 61 y.o. male with history of hypertension, hyperlipidemia, diabetes mellitus, and renal calculi,  presenting with right-sided weakness and slurred speech. He did not receive IV t-PA due to late presentation.  Stroke:  Acute lacunar infarct in the posterior limb left internal capsule/lateral thalamus secondary to small vessel disease.   Resultant  Right hand dexterity mild difficulty  CT head - low attenuation associated with the posterior limb of the left internal capsule   MRI head - Acute lacunar infarct in the  posterior limb left internal capsule/lateral thalamus.   CTA H&N - Short-segment severe 70% left V1 stenosis, with additional mild to moderate multifocal left V2 stenoses.  2D Echo - EF 60-65%  LDL - 83  HgbA1c - pending  VTE prophylaxis - subcutaneous heparin Diet heart healthy/carb modified Room service appropriate? Yes; Fluid consistency: Thin  aspirin 81 mg daily prior to admission, now on clopidogrel 75 mg daily. Continue plavix on discharge  Patient counseled to be compliant with his antithrombotic medications  Ongoing aggressive stroke risk factor management  Therapy recommendations:  No follow-up physical therapy recommended.  Disposition:  Pending  Hypertension  Stable - mildly low at times  Permissive hypertension (OK if < 220/120) but gradually normalize in 5-7 days  Long-term BP goal normotensive  Hyperlipidemia  Home meds:  Lipitor 40 mg resumed in hospital  LDL 83, goal < 70  Increase Lipitor to 80 mg daily  Continue statin at discharge  Diabetes  HgbA1c pending, goal < 7.0  Uncontrolled  Hyperglycemia  SSI  CBG monitoring  Close PCP follow up  Other Stroke Risk Factors  Advanced  age  ETOH use, advised to drink no more than 1 drink per day.  Family hx stroke (father)  Other Active Problems  Interested in dental trial, information given   Hospital day # 1  Neurology will sign off. Please call with questions. Pt will follow up with Cecille Rubin, NP, at [facility] in about 6 weeks. Thanks for the consult.  Rosalin Hawking, MD PhD Stroke Neurology 04/27/2017 12:49 PM   To contact Stroke Continuity provider, please refer to [email or web address] After hours, contact General Neurology

## 2017-04-27 NOTE — Evaluation (Signed)
Occupational Therapy Evaluation Patient Details Name: Jon [name] MRN: [id number] DOB: October 24, 1955 Today's Date: 04/27/2017    History of Present Illness Pt is a 62 y/o male admitted secondary to R sided weakness and slurred speech. MRI revealed an acute lacunar infarct in the posterior limb left internal. PMH including but not limited to DM, HTN and HLD.   Clinical Impression   PTA Pt independent in ADL and mobility. Works full time. Pt is currently able to function at mod I level - complaints about decreased grip on items like utensils and toothbrush during sustained activity and decreased fine motor during tasks like typing a text on his phone. Pt is functional, however, OT provided Pt with theraputty and HEP as well as squeeze ball. OT will continue to follow in the acute setting one more additional time (if still hospitalized tomorrow) to follow up with questions or concerns about HEP and see if deficits have decreased at all.    Follow Up Recommendations  No OT follow up    Equipment Recommendations  None recommended by OT    Recommendations for Other Services       Precautions / Restrictions Precautions Precautions: None Restrictions Weight Bearing Restrictions: No      Mobility Bed Mobility Overal bed mobility: Modified Independent                Transfers Overall transfer level: Modified independent Equipment used: None                  Balance Overall balance assessment: Needs assistance Sitting-balance support: Feet supported Sitting balance-Leahy Scale: Normal Sitting balance - Comments: pt able to don socks sitting EOB independently   Standing balance support: During functional activity;No upper extremity supported Standing balance-Leahy Scale: Good                             ADL either performed or assessed with clinical judgement   ADL Overall ADL's : Modified independent                                        General ADL Comments: Pt still able to perform ADL, just takes more time. Pt educated in compensatory strategies like built up handles     Vision Baseline Vision/History: Wears glasses Wears Glasses: At all times(progressive) Patient Visual Report: No change from baseline       Perception     Praxis      Pertinent Vitals/Pain Pain Assessment: No/denies pain     Hand Dominance Right   Extremity/Trunk Assessment Upper Extremity Assessment Upper Extremity Assessment: RUE deficits/detail RUE Deficits / Details: strength WFL, slight discoordination in fine motor skills and struggles to sustain grasp on ADL items like toothbrush and utensils, as well and managing tasks like texting on his phone RUE Coordination: decreased fine motor   Lower Extremity Assessment Lower Extremity Assessment: Defer to PT evaluation   Cervical / Trunk Assessment Cervical / Trunk Assessment: Normal   Communication Communication Communication: Expressive difficulties;Other (comment)(difficult to understand at times)   Cognition Arousal/Alertness: Awake/alert Behavior During Therapy: WFL for tasks assessed/performed Overall Cognitive Status: Within Functional Limits for tasks assessed  General Comments  wife present during session    Exercises Exercises: Other exercises Other Exercises Other Exercises: theraputty (yellow) and theraputty HEP Other Exercises: squeeze ball   Shoulder Instructions      Home Living Family/patient expects to be discharged to:: Private residence Living Arrangements: Spouse/significant other Available Help at Discharge: Family;Available PRN/intermittently Type of Home: House Home Access: Level entry     Home Layout: Two level;Able to live on main level with bedroom/bathroom Alternate Level Stairs-Number of Steps: flight   Bathroom Shower/Tub: Tub/shower unit;Walk-in shower   Bathroom Toilet: Standard      Home Equipment: None          Prior Functioning/Environment Level of Independence: Independent                 OT Problem List: Decreased activity tolerance;Decreased coordination;Impaired UE functional use      OT Treatment/Interventions: Therapeutic exercise;Therapeutic activities;Patient/family education    OT Goals(Current goals can be found in the care plan section) Acute Rehab OT Goals Patient Stated Goal: to return to "normal" (in regards to speech and strength) OT Goal Formulation: With patient/family Time For Goal Achievement: 05/11/17 Potential to Achieve Goals: Good ADL Goals Pt/caregiver will Perform Home Exercise Program: Right Upper extremity;With theraputty;With written HEP provided;Independently  OT Frequency: Min 2X/week   Barriers to D/C:            Co-evaluation              AM-PAC PT "6 Clicks" Daily Activity     Outcome Measure Help from another person eating meals?: A Little Help from another person taking care of personal grooming?: A Little Help from another person toileting, which includes using toliet, bedpan, or urinal?: None Help from another person bathing (including washing, rinsing, drying)?: None Help from another person to put on and taking off regular upper body clothing?: None Help from another person to put on and taking off regular lower body clothing?: None 6 Click Score: 22   End of Session Equipment Utilized During Treatment: Gait belt Nurse Communication: Mobility status  Activity Tolerance: Patient tolerated treatment well Patient left: in bed;with call bell/phone within reach;with family/visitor present  OT Visit Diagnosis: Hemiplegia and hemiparesis Hemiplegia - Right/Left: Right Hemiplegia - dominant/non-dominant: Dominant Hemiplegia - caused by: Cerebral infarction                Time: 1201-1232 OT Time Calculation (min): 31 min Charges:  OT General Charges $OT Visit: 1 Visit OT Evaluation $OT Eval  Moderate Complexity: 1 Mod OT Treatments $Therapeutic Exercise: 8-22 mins G-Codes:     Jon [name] OTR/L [phone]  Jon [name] 04/27/2017, 2:01 PM

## 2017-04-27 NOTE — Progress Notes (Signed)
The patient is stable to be discharged today. We are just awaiting an SLP exam.  Per RN, SLP has not committed to seeing this patient today and therefore his discharge will be delayed because of this.    Debbe Odea, MD

## 2017-04-27 NOTE — Evaluation (Signed)
Speech Language Pathology Evaluation Patient Details Name: Jon [name] MRN: [id number] DOB: 28-Feb-1956 Today's Date: 04/27/2017 Time: 6144-3154 SLP Time Calculation (min) (ACUTE ONLY): 15 min  Problem List:  Patient Active Problem List   Diagnosis Date Noted  . CVA (cerebral vascular accident) ([place]) 04/26/2017  . Routine general medical examination at a health care facility 06/04/2016  . Trigger finger, acquired 02/24/2016  . Erectile dysfunction 03/17/2015  . Diabetes ([place]) 03/12/2015  . Dyslipidemia 03/12/2015  . HTN (hypertension) 03/12/2015   Past Medical History:  Past Medical History:  Diagnosis Date  . Diabetes mellitus without complication ([place])    type II  . History of kidney stones   . Hyperlipidemia   . Hypertension   . Kidney stones   . Stomach ulcer    Past Surgical History:  Past Surgical History:  Procedure Laterality Date  . CYSTOSCOPY Left 12/10/2016   Procedure: cystoscopy with left ureteral stone extraction ;  Surgeon: Kathie Rhodes, MD;  Location: [facility];  Service: Urology;  Laterality: Left;  . HERNIA REPAIR    . Stomach ulcer     HPI:  Pt is a 61 y/o male admitted secondary to R sided weakness and slurred speech. MRI revealed an acute lacunar infarct in the posterior limb left internal. PMH including but not limited to DM, HTN and HLD.   Assessment / Plan / Recommendation Clinical Impression  Patient presents with mild-moderate dysarthria and reduced intelligibility, 85% in conversation for this trained listener. Pt reports he has trouble getting his words out; noted increasing disfluencies with faster rate, increased length of utterance. Pt works full time as a Sports coach; he is concerned about his ability to communicate. Recommend f/u in OP SLP for dysarthria to improve intelligibility. Pt/wife in agreement. No further acute needs identified; SLP will s/o.     SLP Assessment  SLP Recommendation/Assessment: All further Speech Lanaguage  Pathology  needs can be addressed in the next venue of care SLP Visit Diagnosis: Dysarthria and anarthria (R47.1)    Follow Up Recommendations  Outpatient SLP    Frequency and Duration           SLP Evaluation Cognition  Overall Cognitive Status: Within Functional Limits for tasks assessed Arousal/Alertness: Awake/alert Orientation Level: Oriented X4 Attention: Focused;Sustained;Selective Focused Attention: Appears intact Sustained Attention: Appears intact Selective Attention: Appears intact       Comprehension  Auditory Comprehension Overall Auditory Comprehension: Appears within functional limits for tasks assessed Visual Recognition/Discrimination Discrimination: Within Function Limits Reading Comprehension Reading Status: Not tested    Expression Expression Primary Mode of Expression: Verbal Verbal Expression Overall Verbal Expression: Appears within functional limits for tasks assessed Initiation: No impairment Automatic Speech: Name;Social Response Level of Generative/Spontaneous Verbalization: Conversation Repetition: No impairment Naming: No impairment Pragmatics: No impairment Written Expression Dominant Hand: Right Written Expression: Not tested   Oral / Motor  Oral Motor/Sensory Function Overall Oral Motor/Sensory Function: Within functional limits Motor Speech Overall Motor Speech: Impaired Respiration: Within functional limits Phonation: Low vocal intensity Resonance: Within functional limits Articulation: Impaired Level of Impairment: Sentence Intelligibility: Intelligibility reduced Word: 75-100% accurate Phrase: 75-100% accurate Sentence: 75-100% accurate(90%) Conversation: 75-100% accurate(85%) Motor Speech Errors: Aware Effective Techniques: Slow rate;Increased vocal intensity;Over-articulate   GO                    Aliene Altes 04/27/2017, 4:23 PM  Deneise Lever, [place], [place] Speech-Language Pathologist [phone]

## 2017-04-27 NOTE — Evaluation (Signed)
Physical Therapy Evaluation & Discharge Patient Details Name: Rahsaan Weakland MRN: [id number] DOB: Feb 13, 1956 Today's Date: 04/27/2017   History of Present Illness  Pt is a 61 y/o male admitted secondary to R sided weakness and slurred speech. MRI revealed an acute lacunar infarct in the posterior limb left internal. PMH including but not limited to DM, HTN and HLD.  Clinical Impression  Pt presented supine in bed with HOB elevated, awake and willing to participate in therapy session. Prior to admission, pt reported that he was independent with all functional mobility and ADLs. Pt lives with his spouse in a two level home with his bedroom/bathroom on the first level. Pt ambulated in hallway with supervision without use of an AD. Pt also participated in a higher level balance assessment and scored a 23/24 on the DGI, indicating that he is a safe community ambulator. No further acute PT needs identified at this time. PT signing off.     Follow Up Recommendations No PT follow up    Equipment Recommendations  None recommended by PT    Recommendations for Other Services       Precautions / Restrictions Precautions Precautions: None Restrictions Weight Bearing Restrictions: No      Mobility  Bed Mobility Overal bed mobility: Modified Independent                Transfers Overall transfer level: Modified independent Equipment used: None                Ambulation/Gait Ambulation/Gait assistance: Supervision Ambulation Distance (Feet): 300 Feet Assistive device: None Gait Pattern/deviations: Step-through pattern Gait velocity: WFL Gait velocity interpretation: at or above normal speed for age/gender General Gait Details: mild instability but no overt LOB or need for physical assistance, supervision for safety  Stairs            Wheelchair Mobility    Modified Rankin (Stroke Patients Only) Modified Rankin (Stroke Patients Only) Pre-Morbid Rankin Score: No  symptoms Modified Rankin: No significant disability     Balance Overall balance assessment: Needs assistance Sitting-balance support: Feet supported Sitting balance-Leahy Scale: Normal Sitting balance - Comments: pt able to don socks sitting EOB independently   Standing balance support: During functional activity;No upper extremity supported Standing balance-Leahy Scale: Good                   Standardized Balance Assessment Standardized Balance Assessment : Dynamic Gait Index   Dynamic Gait Index Level Surface: Normal Change in Gait Speed: Normal Gait with Horizontal Head Turns: Normal Gait with Vertical Head Turns: Normal Gait and Pivot Turn: Normal Step Over Obstacle: Normal Step Around Obstacles: Normal Steps: Mild Impairment Total Score: 23       Pertinent Vitals/Pain Pain Assessment: No/denies pain    Home Living Family/patient expects to be discharged to:: Private residence Living Arrangements: Spouse/significant other Available Help at Discharge: Family;Available PRN/intermittently Type of Home: House Home Access: Level entry     Home Layout: Two level;Able to live on main level with bedroom/bathroom Home Equipment: None      Prior Function Level of Independence: Independent               Hand Dominance   Dominant Hand: Right    Extremity/Trunk Assessment   Upper Extremity Assessment Upper Extremity Assessment: Defer to OT evaluation    Lower Extremity Assessment Lower Extremity Assessment: Overall WFL for tasks assessed    Cervical / Trunk Assessment Cervical / Trunk Assessment: Normal  Communication  Communication: Expressive difficulties;Other (comment)(difficult to understand at times)  Cognition Arousal/Alertness: Awake/alert Behavior During Therapy: WFL for tasks assessed/performed Overall Cognitive Status: Within Functional Limits for tasks assessed                                        General  Comments      Exercises     Assessment/Plan    PT Assessment Patent does not need any further PT services  PT Problem List         PT Treatment Interventions      PT Goals (Current goals can be found in the Care Plan section)  Acute Rehab PT Goals Patient Stated Goal: to return to "normal" (in regards to speech and strength)    Frequency     Barriers to discharge        Co-evaluation               AM-PAC PT "6 Clicks" Daily Activity  Outcome Measure Difficulty turning over in bed (including adjusting bedclothes, sheets and blankets)?: None Difficulty moving from lying on back to sitting on the side of the bed? : None Difficulty sitting down on and standing up from a chair with arms (e.g., wheelchair, bedside commode, etc,.)?: None Help needed moving to and from a bed to chair (including a wheelchair)?: None Help needed walking in hospital room?: None Help needed climbing 3-5 steps with a railing? : A Little 6 Click Score: 23    End of Session   Activity Tolerance: Patient tolerated treatment well Patient left: with call bell/phone within reach;Other (comment)(sitting EOB eating breakfast) Nurse Communication: Mobility status PT Visit Diagnosis: Other symptoms and signs involving the nervous system (K81.275)    Time: 1700-1749 PT Time Calculation (min) (ACUTE ONLY): 17 min   Charges:   PT Evaluation $PT Eval Moderate Complexity: 1 Mod     PT G Codes:        Stall[place], PT, DPT [place] 04/27/2017, 8:55 AM

## 2017-04-27 NOTE — Progress Notes (Signed)
OT Cancellation Note  Patient Details Name: Jon [name] MRN: [id number] DOB: [birth year]   Cancelled Treatment:    Reason Eval/Treat Not Completed: Patient at procedure or test/ unavailable. OT will continue to follow for eval upon return to room.  [place] 04/27/2017, 8:16 AM  Hulda Humphrey OTR/L [phone]

## 2017-04-27 NOTE — Progress Notes (Signed)
D/c paperwork, instructions, and new RX gone over w/ pt and pts wife. Pt had concerns regarding aspirin and plavix as well as metformin.  Spoke w/ Dr. Wynelle Cleveland to verify Pavix will be replacing aspirin and metformin may be started again in 2 days.  Explained all to pt who states understanding.  Pt to d/c to home. Taken downstairs by wheelchair.

## 2017-04-27 NOTE — Progress Notes (Signed)
Per MD, waiting on ST eval for pt to d/c. Spoke w/ ST regarding imminent d/c and stressed importance of pt eval asap. Per ST, must see NPO pts first regardless. Relayed all to attending MD and provided ST pager number

## 2017-04-28 LAB — HEMOGLOBIN A1C
Hgb A1c MFr Bld: 10.1 % — ABNORMAL HIGH (ref 4.8–5.6)
MEAN PLASMA GLUCOSE: 243 mg/dL

## 2017-05-02 ENCOUNTER — Ambulatory Visit: Payer: Self-pay | Attending: Internal Medicine | Admitting: Speech Pathology

## 2017-05-02 ENCOUNTER — Other Ambulatory Visit: Payer: Self-pay

## 2017-05-02 DIAGNOSIS — R471 Dysarthria and anarthria: Secondary | ICD-10-CM | POA: Insufficient documentation

## 2017-05-02 NOTE — Therapy (Signed)
[facility] [address] [place], [place], [address] Phone: [phone]   Fax:  [phone]  Speech Language Pathology Evaluation  Patient Details  Name: Daymen Hassebrock MRN: [id number] Date of Birth: Mar 14, 1956 Referring Provider: Dr. Wynelle Cleveland   Encounter Date: 05/02/2017  End of Session - 05/02/17 1916    Visit Number  1    Number of Visits  9    Date for SLP Re-Evaluation  06/07/17    Authorization Type  self-pay    SLP Start Time  1019    SLP Stop Time   1100    SLP Time Calculation (min)  41 min    Activity Tolerance  Patient tolerated treatment well       Past Medical History:  Diagnosis Date  . Diabetes mellitus without complication ([place])    type II  . History of kidney stones   . Hyperlipidemia   . Hypertension   . Kidney stones   . Stomach ulcer     Past Surgical History:  Procedure Laterality Date  . CYSTOSCOPY Left 12/10/2016   Procedure: cystoscopy with left ureteral stone extraction ;  Surgeon: Kathie Rhodes, MD;  Location: [facility];  Service: Urology;  Laterality: Left;  . HERNIA REPAIR    . Stomach ulcer      There were no vitals filed for this visit.  Subjective Assessment - 05/02/17 1022    Subjective  "It's progressing in the right direction."    Currently in Pain?  No/denies         SLP Evaluation [facility] - 05/02/17 1023      SLP Visit Information   SLP Received On  05/02/17    Referring Provider  Dr. Wynelle Cleveland    Onset Date  04/27/17    Medical Diagnosis  CVA      Pain Assessment   Currently in Pain?  No/denies      General Information   HPI  Pt is a 61 y/o male admitted secondary to R sided weakness and slurred speech. MRI revealed an acute lacunar infarct in the posterior limb left internal. PMH including but not limited to DM, HTN and HLD.    Behavioral/Cognition  alert, cooperative    Mobility Status  ambulated to session      Balance Screen   Has the patient fallen in the  past 6 months  No      Prior Functional Status   Cognitive/Linguistic Baseline  Within functional limits    Type of Home  House     Lives With  Spouse    Available Support  Family    Vocation  Full time employment      Pain Assessment   Pain Assessment  No/denies pain      Cognition   Overall Cognitive Status  Within Functional Limits for tasks assessed MOCA 27/30    Attention  Selective    Selective Attention  Appears intact    Memory  Appears intact    Awareness  Appears intact    Problem Solving  Appears intact      Auditory Comprehension   Overall Auditory Comprehension  Appears within functional limits for tasks assessed      Visual Recognition/Discrimination   Discrimination  Within Function Limits      Reading Comprehension   Reading Status  Within funtional limits      Expression   Primary Mode of Expression  Verbal      Verbal Expression   Overall  Verbal Expression  Appears within functional limits for tasks assessed      Written Expression   Dominant Hand  Right    Written Expression  Not tested      Oral Motor/Sensory Function   Overall Oral Motor/Sensory Function  Appears within functional limits for tasks assessed      Motor Speech   Overall Motor Speech  Impaired    Respiration  Within functional limits    Phonation  Low vocal intensity;Hoarse    Resonance  Within functional limits    Articulation  Impaired    Level of Impairment  Sentence    Intelligibility  Intelligibility reduced    Word  75-100% accurate    Phrase  75-100% accurate    Sentence  75-100% accurate 90%    Conversation  75-100% accurate 85%    Motor Planning  Witnin functional limits    Motor Speech Errors  Aware    Phonation  Impaired    Volume  Soft      Standardized Assessments   Standardized Assessments   Montreal Cognitive Assessment (MOCA)    Montreal Cognitive Assessment (MOCA)   27/30, within normal limits                      SLP Education -  05/02/17 1916    Education provided  Yes    Education Details  HEP for dysarthria    Person(s) Educated  Patient    Methods  Explanation    Comprehension  Verbalized understanding         SLP Long Term Goals - 05/02/17 1923      SLP LONG TERM GOAL #1   Title  Pt will demo HEP for dysarthria with modified independence over 2 sessions.     Time  4    Period  Weeks    Status  New      SLP LONG TERM GOAL #2   Title  Pt will demo compensations for dysarthria in 15 minutes simple to mod complex conversation for >95% intelligibility over 2 sessions.    Time  4    Period  Weeks    Status  New      SLP LONG TERM GOAL #3   Title  Pt will report less frequent requests for repetition from communication partners.    Time  4    Period  Weeks    Status  New       Plan - 05/02/17 1917    Clinical Impression Statement  Patient presents with mild-moderate dysarthria and reduced intelligibility, 85% in conversation for this trained listener. Pt known to this SLP from his evaluation during hospitalization. Subjectively, his voice is lower in intensity with hoarse vocal quality. Pt reports fewer disfluencies, which are less notable today. Primary impact on intelligibility today is hypoarticulation. Pt reports requests from his wife to repeat himself. He works full time in medical transport and would benefit from skilled ST to improve intelligibility for communication at home and work.     Speech Therapy Frequency  2x / week    Duration  4 weeks    Treatment/Interventions  Compensatory strategies;SLP instruction and feedback;Patient/family education;Functional tasks;Oral motor exercises    Potential to Achieve Goals  Good    Potential Considerations  Financial resources    SLP Home Exercise Plan  reviewed/provided SLOP strategies, HEP    Consulted and Agree with Plan of Care  Patient       Patient will  benefit from skilled therapeutic intervention in order to improve the following deficits and  impairments:   Dysarthria and anarthria    Problem List Patient Active Problem List   Diagnosis Date Noted  . CVA (cerebral vascular accident) ([place]) 04/26/2017  . Routine general medical examination at a health care facility 06/04/2016  . Trigger finger, acquired 02/24/2016  . Erectile dysfunction 03/17/2015  . Diabetes ([place]) 03/12/2015  . Dyslipidemia 03/12/2015  . HTN (hypertension) 03/12/2015   Deneise Lever, [place], CCC-SLP Speech-Language Pathologist  Aliene Altes 05/02/2017, 7:26 PM  [facility] [address] [place], [place], [address] Phone: [phone]   Fax:  [phone]  Name: Chadwick Reiswig MRN: [id number] Date of Birth: 04/12/1956

## 2017-05-02 NOTE — Patient Instructions (Signed)
S   Slow L   Loud O  Overarticulate P   Pause  Speech Exercises  Do 2 times a day   Tip of Tongue  1. Say "time" "took" "take" "town" and "Tom". Repeat 3 times 2. Say "long" "look" "low" "lay and "lie". Repeat 3 times. 3. Say "name" "neck" "not" "new" and "no". Repeat 3 times. 4. Say "down" "dip" "Don" and "do." Repeat 3 times.  Back of Tongue  1. Say "Kick" "cake" "Anda Kraft" "key" "keep" "kite" and "Yvone Neu". Repeat 3 times 2. Say "Gate" "gag" "got" "get" "gone" and "go". Repeat 3 times.   Read these tongue twisters, make sure to emphasize the "explosive" sounds.  Call the cat "Buttercup" A calendar of [place], [place] Four floors to cover Yellow oil ointment Fellow lovers of felines Catastrophe in [place]' plums The church's chimes chimed Telling time 'til eleven Five valve levers Keep the gate closed Go see that guy Fat cows give milk [employer] Gophers Fat frogs flip freely Kohl's into bed Get that game to Greg Thick thistles stick together Cinnamon aluminum linoleum Black bugs blood Lovely lemon linament Red leather, yellow leather  Big grocery buggy    Purple baby carriage [facility] Proper copper coffee pot Ripe purple cabbage Three free throws [employer] tackled  [employer] dipped the dessert  [facility] [place] that [employer] of [employer] Shirts shrink, shells shouldn't [place] 49ers Take the tackle box File the flash message Give me five flapjacks Fundamental relatives Dye the pets purple Talking [place] time after time Dark chocolate chunks Political landscape of the kingdom Estate manager/land agent genius We played yo-yos yesterday

## 2017-05-11 ENCOUNTER — Other Ambulatory Visit: Payer: Self-pay | Admitting: Endocrinology

## 2017-05-11 NOTE — Discharge Summary (Signed)
Physician Discharge Summary  Jon [name] LJQ:[id number] DOB: Feb 19, 1956 DOA: 04/26/2017  PCP: Biagio Borg, MD  Admit date: 04/26/2017 Discharge date: 05/11/2017  Admitted From: home  Disposition:  home   Recommendations for Outpatient Follow-up:  1. F/u A1c which was done in the hospital.  2. F/u LDL 3. Metformin can be resumed in 2 days (had CTA)     Discharge Condition: stable CODE STATUS:  Full Code   Consultations:  stable    Discharge Diagnoses:  Principal Problem:   CVA (cerebral vascular accident) ([place]) Active Problems:   Diabetes ([place])   Dyslipidemia   HTN (hypertension)    Subjective: He feels that the weakness in the right sided has improved but he still is having trouble with things such as holding a fork and texting on his phone. He feels his speech has improved as well but still feels it may be a bit slurred. I do not appreciate this on exam.   HPI: Jon [name] is a 61 y.o. male with medical history significant of Diabetes, Hypertension and Hyperlipidemia.  Pt presented to the Emergency department this morning complaining of his right hand not working. Pt awoke around 6am today. Pt was using a computer and was unable to click of uses keys.  Pt noticed difficulty walking.  He felt as if he was dragging his right leg.  Pt reports difficulty speaking.  Pt had some slurred speech.  Pt went to work but was unable to work.  Family Md advised pt to go to the Emergency department for evaluation.   Pt reports symptoms are improving. Weakness and speech are improving.   Pt denies fever, no nausea or vomiting. No chest pain, no shortness of breath.  Pt felt well yesterday and was normal at bedtime.      Hospital Course:  CVA - MRI: Acute lacunar infarct in the posterior limb left internal capsule/lateral thalamus. - CTA head and neck shows atherosclerosis and some stenosis- see report below - LDL: 83- Lipitor dose should be increased to get LDL < 70  - A1c:  pending - PT eval>> no PT follow up recommended - OT eval>> therapeutic exercises given to improve coordination in right hand- no other therapy needed- I have spoken with the therapist just now about this - ASA is being changed to Plavix and Lipitor is being increased  DM2 - cont Amaryl and Metformin - need f/u with PCP- Aba1c is still  Pending - last A1c in Oct was 9.8  HTN - Norvasc - Lisinopril  HLD - Lipitor  Discharge Instructions  Discharge Instructions    Ambulatory referral to Neurology   Complete by:  As directed    Follow up with stroke clinic Cecille Rubin preferred, if not available, then consider Caesar Chestnut, [facility] or Jaynee Eagles whoever is available) at [facility] in about 6-8 weeks. Thanks.   Ambulatory referral to Speech Therapy   Complete by:  As directed    If ordering provider is a resident, enter attending physician's name:  Jusitn Salsgiver   Diet - low sodium heart healthy   Complete by:  As directed    Diet Carb Modified   Complete by:  As directed    Increase activity slowly   Complete by:  As directed      Allergies as of 04/27/2017   No Known Allergies     Medication List    STOP taking these medications   aspirin 81 MG chewable tablet   metFORMIN 500 MG 24 hr  tablet Commonly known as:  GLUCOPHAGE-XR     TAKE these medications   amLODipine 10 MG tablet Commonly known as:  NORVASC Take 1 tablet (10 mg total) daily by mouth.   atorvastatin 40 MG tablet Commonly known as:  LIPITOR Take 1 tablet (40 mg total) by mouth daily.   clopidogrel 75 MG tablet Commonly known as:  PLAVIX Take 1 tablet (75 mg total) by mouth daily.   glimepiride 4 MG tablet Commonly known as:  AMARYL Take 2 tablets (8 mg total) daily with breakfast by mouth.   glucose blood test strip Commonly known as:  ONETOUCH VERIO 1 each by Other route daily. And lancets 1/day   lisinopril 40 MG tablet Commonly known as:  PRINIVIL,ZESTRIL TAKE 1 TABLET BY MOUTH DAILY   ONETOUCH  DELICA LANCETS 33G Misc Use to check blood sugar 1 time per day. DX Code: E11.9   ONETOUCH VERIO w/Device Kit 1 Device by Does not apply route once.   tamsulosin 0.4 MG Caps capsule Commonly known as:  FLOMAX Take 1 capsule (0.4 mg total) by mouth daily.      Follow-up Information    Martin, Nancy Carolyn, NP. Schedule an appointment as soon as possible for a visit in 6 week(s).   Specialty:  Family Medicine Contact information: [address] [place] [address] [phone]        John, James W, MD Follow up in 1 week(s).   Specialties:  Internal Medicine, Radiology Why:  please see your PCP in 1 wk Contact information: [address] [place] [address] [phone]        [facility] Follow up.   Specialty:  Rehabilitation Why:  They will call you for speech therapy appointment. Contact information: [id number] [place] [address] [phone]       [facility] Follow up.   Why:  Call for appointment on Monday or go to office Thursday morning.  You may use this pharmacy when you establish primary care at clinic. Medications will be $4-$10. Contact information: [address] [place] [address] [phone]       [facility] Follow up.   Specialty:  Internal Medicine Why:  Call for appointment for primary MD. Contact information: [address] [place] [address] [phone]         No Known Allergies   Procedures/Studies: 2 D ECHO Left ventricle: The cavity size was normal. Wall thickness was   increased in a pattern of mild LVH. Systolic function was normal.   The estimated ejection fraction was in the range of 60% to 65%.   Wall motion was normal; there were no regional wall motion   abnormalities. Doppler parameters are consistent with abnormal    left ventricular relaxation (grade 1 diastolic dysfunction). - Aortic valve: Trileaflet; mildly thickened, mildly calcified   leaflets. - Aorta: Aortic root dimension: 41 mm (ED). - Ascending aorta: The ascending aorta was mildly dilated. - Mitral valve: Calcified annulus.  Impressions:  - No cardiac source of emboli was indentified.   Ct Angio Head W Or Wo Contrast  Result Date: 04/27/2017 CLINICAL DATA:  Follow-up examination for acute stroke. EXAM: CT ANGIOGRAPHY HEAD AND NECK TECHNIQUE: Multidetector CT imaging of the head and neck was performed using the standard protocol during bolus administration of intravenous contrast. Multiplanar CT image reconstructions and MIPs were obtained to evaluate   the vascular anatomy. Carotid stenosis measurements (when applicable) are obtained utilizing NASCET criteria, using the distal internal carotid diameter as the denominator. CONTRAST:  50mL ISOVUE-370 IOPAMIDOL (ISOVUE-370) INJECTION 76% COMPARISON:  Prior CT and MRI from 04/26/2017. FINDINGS: CTA NECK FINDINGS Aortic arch: Visualized aortic arch normal in caliber or with normal 3 vessel morphology. Minimal plaque within the arch itself. No flow-limiting stenosis about the origin of the great vessels. Visualized subclavian artery is widely patent. Right carotid system: Right common carotid artery widely patent to the bifurcation. Mild mixed plaque about the right bifurcation/proximal right ICA with relatively mild stenosis of up to 35% by NASCET criteria. Right ICA widely patent distally to the skullbase without stenosis, dissection, or occlusion. Left carotid system: Left common carotid artery patent from its origin to the bifurcation. Irregularity with superimposed small focal outpouching at the origin of the left ICA reflects penetrating plaque (series 7, image 197). Short-segment mild stenosis of up to 30% by NASCET criteria just distally within the proximal left ICA. Left ICA widely patent distally  to the skullbase without stenosis, dissection, or occlusion. Vertebral arteries: Both of the vertebral artery is arise from the subclavian arteries. Left vertebral artery dominant. Pre foraminal left V1 segment tortuous with associated stenosis of up to approximately 70% (series 8, image 172). Additional multifocal mild to moderate stenoses present within the left V2 segment, most notable at the level of C5 (series 7, image 204). Left vertebral artery otherwise patent to the skullbase without stenosis or occlusion. Right vertebral artery diminutive bone widely patent to the skullbase without hemodynamically significant stenosis. Skeleton: No acute osseus abnormality. No worrisome lytic or blastic osseous lesions. Moderate cervical spondylolysis, most evident at C4-5 and C5-6. Other neck: No acute soft tissue abnormality within the neck. Salivary glands normal. Thyroid normal. No adenopathy. Upper chest: Visualized upper chest within normal limits. Partially visualized lungs are grossly clear. Review of the MIP images confirms the above findings CTA HEAD FINDINGS Anterior circulation: Internal carotid artery is patent to the termini without flow-limiting stenosis. A1 segments widely patent. Normal anterior communicating artery. Atheromatous irregularity within the ACAs without high-grade flow-limiting stenosis. M1 segments demonstrate mild atheromatous irregularity without flow-limiting stenosis. Normal MCA bifurcations. No proximal M2 occlusion. Distal small vessel irregularity within the MCA branches bilaterally. Posterior circulation: Dominant left V4 segment patent to the vertebrobasilar junction without flow-limiting stenosis. Right V4 segment demonstrates mild multifocal narrowing without high-grade stenosis. Basilar artery widely patent to its distal aspect. Superior cerebral arteries patent bilaterally. Both of the posterior cerebral arteries primarily supplied via the basilar. Small right posterior  communicating artery noted. No flow-limiting or proximal PCA stenosis. Distal small vessel atheromatous irregularity. Venous sinuses: Grossly patent. Anatomic variants: None significant. No aneurysm or vascular abnormality. Delayed phase: No abnormal enhancement. Small evolving acute ischemic left internal capsule/lateral left thalamus infarct noted. Review of the MIP images confirms the above findings IMPRESSION: 1. Negative CTA for large vessel occlusion. 2. Mild atheromatous stenoses of approximately 30-35% by NASCET criteria within the proximal ICAs bilaterally. 3. Short-segment severe 70% pre foraminal left V1 stenosis, with additional mild to moderate multifocal left V2 stenoses. Left vertebral artery is dominant. Diminutive right vertebral artery widely patent. 4. Distal small vessel atheromatous irregularity throughout the intracranial circulation. Electronically Signed   By: Benjamin  McClintock M.D.   On: 04/27/2017 04:12   Ct Head Wo Contrast  Result Date: 04/26/2017 CLINICAL DATA:  61-year-old male with history of right-sided leg and arm weakness today. Slurred speech. EXAM: CT HEAD   WITHOUT CONTRAST TECHNIQUE: Contiguous axial images were obtained from the base of the skull through the vertex without intravenous contrast. COMPARISON:  None. FINDINGS: Brain: Slightly ill-defined area of low attenuation associated with the posterior limb of the left internal capsule (axial image 16 of series 3) could reflect age-indeterminate ischemia. Patchy and confluent areas of decreased attenuation are noted throughout the deep and periventricular white matter of the cerebral hemispheres bilaterally, compatible with chronic microvascular ischemic disease. No evidence of acute hemorrhage, hydrocephalus, extra-axial collection or mass lesion/mass effect. Vascular: No hyperdense vessel or unexpected calcification. Skull: Normal. Negative for fracture or focal lesion. Sinuses/Orbits: Mild multifocal mucosal  thickening in the paranasal sinuses without air-fluid levels. No acute finding. Other: None. IMPRESSION: 1. Subtle slightly ill-defined area of low attenuation associated with the posterior limb of the left internal capsule which could reflect age-indeterminate ischemia. If there is strong clinical concern for acute infarction, this could be further evaluated with MRI of the brain without contrast. 2. Chronic microvascular ischemic changes in the cerebral white matter. These results were called by telephone at the time of interpretation on 04/26/2017 at 9:33 am to Dr. DANA LIU, who verbally acknowledged these results. Electronically Signed   By: Daniel  Entrikin M.D.   On: 04/26/2017 09:34   Ct Angio Neck W Or Wo Contrast  Result Date: 04/27/2017 CLINICAL DATA:  Follow-up examination for acute stroke. EXAM: CT ANGIOGRAPHY HEAD AND NECK TECHNIQUE: Multidetector CT imaging of the head and neck was performed using the standard protocol during bolus administration of intravenous contrast. Multiplanar CT image reconstructions and MIPs were obtained to evaluate the vascular anatomy. Carotid stenosis measurements (when applicable) are obtained utilizing NASCET criteria, using the distal internal carotid diameter as the denominator. CONTRAST:  50mL ISOVUE-370 IOPAMIDOL (ISOVUE-370) INJECTION 76% COMPARISON:  Prior CT and MRI from 04/26/2017. FINDINGS: CTA NECK FINDINGS Aortic arch: Visualized aortic arch normal in caliber or with normal 3 vessel morphology. Minimal plaque within the arch itself. No flow-limiting stenosis about the origin of the great vessels. Visualized subclavian artery is widely patent. Right carotid system: Right common carotid artery widely patent to the bifurcation. Mild mixed plaque about the right bifurcation/proximal right ICA with relatively mild stenosis of up to 35% by NASCET criteria. Right ICA widely patent distally to the skullbase without stenosis, dissection, or occlusion. Left carotid  system: Left common carotid artery patent from its origin to the bifurcation. Irregularity with superimposed small focal outpouching at the origin of the left ICA reflects penetrating plaque (series 7, image 197). Short-segment mild stenosis of up to 30% by NASCET criteria just distally within the proximal left ICA. Left ICA widely patent distally to the skullbase without stenosis, dissection, or occlusion. Vertebral arteries: Both of the vertebral artery is arise from the subclavian arteries. Left vertebral artery dominant. Pre foraminal left V1 segment tortuous with associated stenosis of up to approximately 70% (series 8, image 172). Additional multifocal mild to moderate stenoses present within the left V2 segment, most notable at the level of C5 (series 7, image 204). Left vertebral artery otherwise patent to the skullbase without stenosis or occlusion. Right vertebral artery diminutive bone widely patent to the skullbase without hemodynamically significant stenosis. Skeleton: No acute osseus abnormality. No worrisome lytic or blastic osseous lesions. Moderate cervical spondylolysis, most evident at C4-5 and C5-6. Other neck: No acute soft tissue abnormality within the neck. Salivary glands normal. Thyroid normal. No adenopathy. Upper chest: Visualized upper chest within normal limits. Partially visualized lungs are grossly clear. Review   of the MIP images confirms the above findings CTA HEAD FINDINGS Anterior circulation: Internal carotid artery is patent to the termini without flow-limiting stenosis. A1 segments widely patent. Normal anterior communicating artery. Atheromatous irregularity within the ACAs without high-grade flow-limiting stenosis. M1 segments demonstrate mild atheromatous irregularity without flow-limiting stenosis. Normal MCA bifurcations. No proximal M2 occlusion. Distal small vessel irregularity within the MCA branches bilaterally. Posterior circulation: Dominant left V4 segment patent to  the vertebrobasilar junction without flow-limiting stenosis. Right V4 segment demonstrates mild multifocal narrowing without high-grade stenosis. Basilar artery widely patent to its distal aspect. Superior cerebral arteries patent bilaterally. Both of the posterior cerebral arteries primarily supplied via the basilar. Small right posterior communicating artery noted. No flow-limiting or proximal PCA stenosis. Distal small vessel atheromatous irregularity. Venous sinuses: Grossly patent. Anatomic variants: None significant. No aneurysm or vascular abnormality. Delayed phase: No abnormal enhancement. Small evolving acute ischemic left internal capsule/lateral left thalamus infarct noted. Review of the MIP images confirms the above findings IMPRESSION: 1. Negative CTA for large vessel occlusion. 2. Mild atheromatous stenoses of approximately 30-35% by NASCET criteria within the proximal ICAs bilaterally. 3. Short-segment severe 70% pre foraminal left V1 stenosis, with additional mild to moderate multifocal left V2 stenoses. Left vertebral artery is dominant. Diminutive right vertebral artery widely patent. 4. Distal small vessel atheromatous irregularity throughout the intracranial circulation. Electronically Signed   By: Benjamin  McClintock M.D.   On: 04/27/2017 04:12   Mr Brain Wo Contrast  Result Date: 04/26/2017 CLINICAL DATA:  Right-sided weakness. EXAM: MRI HEAD WITHOUT CONTRAST TECHNIQUE: Multiplanar, multiecho pulse sequences of the brain and surrounding structures were obtained without intravenous contrast. COMPARISON:  Head CT from earlier today FINDINGS: Brain: ~1 cm acute infarct in the left posterior limb internal capsule/lateral thalamus. Chronic microvascular ischemic type gliosis in the cerebral white matter. Remote small vessel infarcts in the bilateral cerebellum, left thalamus, and left pons. No hemorrhage, hydrocephalus, or masslike finding. Vascular: Major flow voids are preserved Skull and  upper cervical spine: Negative for marrow lesion Sinuses/Orbits: Mild patchy mucosal thickening in the paranasal sinuses, likely with superimposed retention cysts. IMPRESSION: 1. Acute lacunar infarct in the posterior limb left internal capsule/lateral thalamus. 2. Chronic small vessel ischemia with remote small vessel infarcts described above. Electronically Signed   By: Jonathon  Watts M.D.   On: 04/26/2017 11:08       Discharge Exam: Vitals:   04/27/17 1200 04/27/17 1600  BP: (!) 141/89 (!) 162/87  Pulse: 77 76  Resp: 18 16  Temp: (!) 97.4 F (36.3 C)   SpO2: 97% 98%   Vitals:   04/27/17 0415 04/27/17 0800 04/27/17 1200 04/27/17 1600  BP: 114/74 115/66 (!) 141/89 (!) 162/87  Pulse:  69 77 76  Resp:  11 18 16  Temp: 97.8 F (36.6 C) 97.6 F (36.4 C) (!) 97.4 F (36.3 C)   TempSrc: Oral Oral Oral   SpO2:  94% 97% 98%  Weight:      Height:        General: Pt is alert, awake, not in acute distress Cardiovascular: RRR, S1/S2 +, no rubs, no gallops Respiratory: CTA bilaterally, no wheezing, no rhonchi Abdominal: Soft, NT, ND, bowel sounds + Extremities: no edema, no cyanosis    The results of significant diagnostics from this hospitalization (including imaging, microbiology, ancillary and laboratory) are listed below for reference.     Microbiology: No results found for this or any previous visit (from the past 240 hour(s)).   Labs: BNP (last   3 results) No results for input(s): BNP in the last 8760 hours. Basic Metabolic Panel: No results for input(s): NA, K, CL, CO2, GLUCOSE, BUN, CREATININE, CALCIUM, MG, PHOS in the last 168 hours. Liver Function Tests: No results for input(s): AST, ALT, ALKPHOS, BILITOT, PROT, ALBUMIN in the last 168 hours. No results for input(s): LIPASE, AMYLASE in the last 168 hours. No results for input(s): AMMONIA in the last 168 hours. CBC: No results for input(s): WBC, NEUTROABS, HGB, HCT, MCV, PLT in the last 168 hours. Cardiac  Enzymes: No results for input(s): CKTOTAL, CKMB, CKMBINDEX, TROPONINI in the last 168 hours. BNP: Invalid input(s): POCBNP CBG: No results for input(s): GLUCAP in the last 168 hours. D-Dimer No results for input(s): DDIMER in the last 72 hours. Hgb A1c No results for input(s): HGBA1C in the last 72 hours. Lipid Profile No results for input(s): CHOL, HDL, LDLCALC, TRIG, CHOLHDL, LDLDIRECT in the last 72 hours. Thyroid function studies No results for input(s): TSH, T4TOTAL, T3FREE, THYROIDAB in the last 72 hours.  Invalid input(s): FREET3 Anemia work up No results for input(s): VITAMINB12, FOLATE, FERRITIN, TIBC, IRON, RETICCTPCT in the last 72 hours. Urinalysis    Component Value Date/Time   COLORURINE YELLOW 04/26/2017 0913   APPEARANCEUR CLEAR 04/26/2017 0913   LABSPEC 1.016 04/26/2017 0913   PHURINE 6.0 04/26/2017 0913   GLUCOSEU >=500 (A) 04/26/2017 0913   HGBUR NEGATIVE 04/26/2017 0913   BILIRUBINUR NEGATIVE 04/26/2017 0913   KETONESUR NEGATIVE 04/26/2017 0913   PROTEINUR NEGATIVE 04/26/2017 0913   NITRITE NEGATIVE 04/26/2017 0913   LEUKOCYTESUR NEGATIVE 04/26/2017 0913   Sepsis Labs Invalid input(s): PROCALCITONIN,  WBC,  LACTICIDVEN Microbiology No results found for this or any previous visit (from the past 240 hour(s)).   Time coordinating discharge: Over 30 minutes  SIGNED:   Debbe Odea, MD  [facility] 05/11/2017, 8:30 AM Pager   If 7PM-7AM, please contact night-coverage [email or web address] Password [email or web address]

## 2017-05-15 ENCOUNTER — Encounter: Payer: Self-pay | Admitting: Internal Medicine

## 2017-05-15 ENCOUNTER — Ambulatory Visit: Payer: Self-pay | Attending: Internal Medicine | Admitting: Internal Medicine

## 2017-05-15 VITALS — BP 128/85 | HR 87 | Temp 98.4°F | Resp 16 | Ht 70.0 in | Wt 202.6 lb

## 2017-05-15 DIAGNOSIS — I69322 Dysarthria following cerebral infarction: Secondary | ICD-10-CM | POA: Insufficient documentation

## 2017-05-15 DIAGNOSIS — I69351 Hemiplegia and hemiparesis following cerebral infarction affecting right dominant side: Secondary | ICD-10-CM | POA: Insufficient documentation

## 2017-05-15 DIAGNOSIS — E119 Type 2 diabetes mellitus without complications: Secondary | ICD-10-CM | POA: Insufficient documentation

## 2017-05-15 DIAGNOSIS — Z8711 Personal history of peptic ulcer disease: Secondary | ICD-10-CM | POA: Insufficient documentation

## 2017-05-15 DIAGNOSIS — I63332 Cerebral infarction due to thrombosis of left posterior cerebral artery: Secondary | ICD-10-CM

## 2017-05-15 DIAGNOSIS — I10 Essential (primary) hypertension: Secondary | ICD-10-CM | POA: Insufficient documentation

## 2017-05-15 DIAGNOSIS — Z79899 Other long term (current) drug therapy: Secondary | ICD-10-CM | POA: Insufficient documentation

## 2017-05-15 DIAGNOSIS — Z87442 Personal history of urinary calculi: Secondary | ICD-10-CM | POA: Insufficient documentation

## 2017-05-15 DIAGNOSIS — E785 Hyperlipidemia, unspecified: Secondary | ICD-10-CM | POA: Insufficient documentation

## 2017-05-15 LAB — GLUCOSE, POCT (MANUAL RESULT ENTRY): POC Glucose: 217 mg/dl — AB (ref 70–99)

## 2017-05-15 MED ORDER — ATORVASTATIN CALCIUM 40 MG PO TABS: 40.0000 mg | ORAL_TABLET | Freq: Every day | ORAL | 1 refills | Status: DC

## 2017-05-15 MED ORDER — AMLODIPINE BESYLATE 10 MG PO TABS: 10.0000 mg | ORAL_TABLET | Freq: Every day | ORAL | 1 refills | Status: DC

## 2017-05-15 MED ORDER — METFORMIN HCL ER (OSM) 500 MG PO TB24: 1000.0000 mg | ORAL_TABLET | Freq: Two times a day (BID) | ORAL | 11 refills | Status: DC

## 2017-05-15 MED ORDER — METFORMIN HCL ER 500 MG PO TB24: 500.0000 mg | ORAL_TABLET | Freq: Two times a day (BID) | ORAL | 5 refills | Status: DC

## 2017-05-15 MED ORDER — CLOPIDOGREL BISULFATE 75 MG PO TABS: 75.0000 mg | ORAL_TABLET | Freq: Every day | ORAL | 1 refills | Status: DC

## 2017-05-15 MED ORDER — GLIMEPIRIDE 4 MG PO TABS
8.0000 mg | ORAL_TABLET | Freq: Every day | ORAL | 1 refills | Status: DC
Start: 2017-05-15 — End: 2017-08-13

## 2017-05-15 MED ORDER — LISINOPRIL 40 MG PO TABS: 40.0000 mg | ORAL_TABLET | Freq: Every day | ORAL | 1 refills | Status: DC

## 2017-05-15 MED FILL — ?CLOPIDOGREL 75MG TAB: 75 | 30 days supply | Qty: 30 | Fill #0

## 2017-05-15 MED FILL — AMLODIPINE BESYLATE 10 MG T: 10 | 30 days supply | Qty: 30 | Fill #0

## 2017-05-15 MED FILL — LISINOPRIL 40 MG TAB: 40 | 30 days supply | Qty: 30 | Fill #0

## 2017-05-15 MED FILL — METFORMIN HCL ER 500 MG TAB: 500 | 30 days supply | Qty: 60 | Fill #0

## 2017-05-15 MED FILL — ?ATORVASTATIN 40MG TABLET: 40 | 30 days supply | Qty: 30 | Fill #0

## 2017-05-15 MED FILL — ?GLIMEPIRIDE 4 MG TABLET: 4 | 30 days supply | Qty: 60 | Fill #0

## 2017-05-15 NOTE — Assessment & Plan Note (Signed)
Resume statin via refill to this pharmacy [facility]) per pt request - LDL last cal at 83, goal <70 with acute CVA 04/2017

## 2017-05-15 NOTE — Progress Notes (Signed)
Patient is here for a hospital follow up

## 2017-05-15 NOTE — Assessment & Plan Note (Signed)
L internal limb capsule acute CVA 04/2017 -  R side weakness improved, min persisting dysarthria and working with ST on same. Continue med management with Plavix antiplt and other med risk factor control

## 2017-05-15 NOTE — Patient Instructions (Addendum)
It was good to see you today.  We have reviewed your prior records including labs and tests today  Medications reviewed and updated, no changes recommended at this time. Refill on medication(s) as discussed today at this pharmacy.  Please schedule followup in 3 months to recheck diabetes mellitus, call sooner if problems.   Sensory Loss After a Stroke A stroke can damage parts of your brain that control your body's normal functions, including your senses. As a result, you may have sensory loss, such as trouble seeing, tasting, swallowing, or feeling touch or pressure sensations. You may have problems feeling temperature changes or moving your body in a coordinated way. You may also perceive smell differently. You may have problems with all of your senses or only some of them. By following a treatment plan, you may recover lost senses and manage the changes to your lifestyle. What are treatment therapies for sensory loss? You may have a combination of therapies for sensory loss.  Physical therapy. This may include: ? Exercises to improve your coordination and balance. ? Exercises that combine touch, balance, and movement (sensorimotor training). ? Movements to relieve pressure while you are sitting or lying (mobility training). ? Splints or braces to protect parts of your body that you cannot feel.  Devices to help with blood flow (circulation) and to help stimulate nerves in affected parts of your body.  Speech therapy to help you swallow safely.  Occupational therapy to help you with everyday tasks. This may include: ? Exercises or devices to improve your vision. ? Exercises to help you increase your touch perception. These may include feeling objects of different sizes and textures while your eyes are closed. ? Techniques for moving safely in your environment.  Prescription eye glasses for vision loss.  Hearing aids for hearing loss.  Follow these instructions at  home: Safety  Your risk of falling is higher after a stroke. You may have difficulty feeling your legs and feet or coordinating your movements. To lower your risk of falling: ? Use devices to help you move around (assistive devices), such as a wheelchair or [place], as directed by your health care team. ? Wear prescription eye glasses at all times when moving around. ? Use lights to help you see in the dark. ? Use grab bars in bathrooms and handrails in stairways. ? Keep walkways clear in your home by removing rugs, cords, and clutter from the floor.  Your risk of getting burned is higher after a stroke. To lower your risk of burns: ? Test the water temperature before taking a bath or washing your hands. ? Allow hot foods to cool slightly before eating. ? Use potholders when handling hot pans.  When using sharp objects, such as scissors or knives, use your healthy hand. Do not handle sharp objects with your hand that was affected by your stroke, if this applies. Activity  Return to your normal activities as told by your health care provider. Ask your health care provider what activities are safe for you.  Avoid spending too much time sitting or lying down. If you must be in a chair or bed, change positions regularly.  You may have problems doing your normal activities. Ask your health care provider about getting extra help at home. Eating and drinking  You may have problems swallowing food and fluids after a stroke. The problems can be due to: ? Changes in your muscles. ? Sensory changes, such as:  Difficulty feeling the consistency or size of  a piece of food in your mouth.  Inability to feel the need to clear your throat.  You may need to: ? Take smaller bites and chew thoroughly. Make sure you have swallowed all the food in your mouth before you take another bite. ? Sit in an upright position when eating or drinking. ? Avoid distractions while eating or drinking. ? Stay upright  for 30-45 minutes after eating. ? Change the texture of some things that you eat and drink. This may include:  Changing foods to a smooth, mashed consistency (puree).  Thickening liquids.  Follow instructions from your health care provider about eating and drinking restrictions. General instructions  Wear arm or leg braces as told by your health care team.  Get help at home as needed. You may need help getting dressed, bathing, using the bathroom, eating, or doing other activities.  Keep all follow-up visits as told by your health care providers. This is important. Summary  It is common to have sensory loss after a stroke. Sensory loss means that you have problems with some or all of your senses, such as vision, taste, hearing, smell, and touch.  Sensory loss happens because of damage to your brain and nervous system after a stroke.  Treatment for sensory loss may include physical, occupational, or speech therapy, and the use of assistive devices.  You may need to make changes to your home and lifestyle after a stroke to help you live safely and independently. This information is not intended to replace advice given to you by your health care provider. Make sure you discuss any questions you have with your health care provider. Document Released: 08/17/2016 Document Revised: 08/17/2016 Document Reviewed: 08/17/2016 Elsevier Interactive Patient Education  2018 [employer].  Diabetes Mellitus and Standards of Medical Care Managing diabetes (diabetes mellitus) can be complicated. Your diabetes treatment may be managed by a team of health care providers, including:  A diet and nutrition specialist (registered dietitian).  A nurse.  A certified diabetes educator (CDE).  A diabetes specialist (endocrinologist).  An eye doctor.  A primary care provider.  A dentist.  Your health care providers follow a schedule in order to help you get the best quality of care. The following  schedule is a general guideline for your diabetes management plan. Your health care providers may also give you more specific instructions. HbA1c ( hemoglobin A1c) test This test provides information about blood sugar (glucose) control over the previous 2-3 months. It is used to check whether your diabetes management plan needs to be adjusted.  If you are meeting your treatment goals, this test is done at least 2 times a year.  If you are not meeting treatment goals or if your treatment goals have changed, this test is done 4 times a year.  Blood pressure test  This test is done at every routine medical visit. For most people, the goal is less than 130/80. Ask your health care provider what your goal blood pressure should be. Dental and eye exams  Visit your dentist two times a year.  If you have type 1 diabetes, get an eye exam 3-5 years after you are diagnosed, and then once a year after your first exam. ? If you were diagnosed with type 1 diabetes as a child, get an eye exam when you are age 28 or older and have had diabetes for 3-5 years. After the first exam, you should get an eye exam once a year.  If you have type  2 diabetes, have an eye exam as soon as you are diagnosed, and then once a year after your first exam. Foot care exam  Visual foot exams are done at every routine medical visit. The exams check for cuts, bruises, redness, blisters, sores, or other problems with the feet.  A complete foot exam is done by your health care provider once a year. This exam includes an inspection of the structure and skin of your feet, and a check of the pulses and sensation in your feet. ? Type 1 diabetes: Get your first exam 3-5 years after diagnosis. ? Type 2 diabetes: Get your first exam as soon as you are diagnosed.  Check your feet every day for cuts, bruises, redness, blisters, or sores. If you have any of these or other problems that are not healing, contact your health care  provider. Kidney function test ( urine microalbumin)  This test is done once a year. ? Type 1 diabetes: Get your first test 5 years after diagnosis. ? Type 2 diabetes: Get your first test as soon as you are diagnosed.  If you have chronic kidney disease (CKD), get a serum creatinine and estimated glomerular filtration rate (eGFR) test once a year. Lipid profile (cholesterol, HDL, LDL, triglycerides)  This test should be done when you are diagnosed with diabetes, and every 5 years after the first test. If you are on medicines to lower your cholesterol, you may need to get this test done every year. ? The goal for LDL is less than 100 mg/dL (5.5 mmol/L). If you are at high risk, the goal is less than 70 mg/dL (3.9 mmol/L). ? The goal for HDL is 40 mg/dL (2.2 mmol/L) for men and 50 mg/dL(2.8 mmol/L) for women. An HDL cholesterol of 60 mg/dL (3.3 mmol/L) or higher gives some protection against heart disease. ? The goal for triglycerides is less than 150 mg/dL (8.3 mmol/L). Immunizations  The yearly flu (influenza) vaccine is recommended for everyone 6 months or older who has diabetes.  The pneumonia (pneumococcal) vaccine is recommended for everyone 2 years or older who has diabetes. If you are 6 or older, you may get the pneumonia vaccine as a series of two separate shots.  The hepatitis B vaccine is recommended for adults shortly after they have been diagnosed with diabetes.  The Tdap (tetanus, diphtheria, and pertussis) vaccine should be given: ? According to normal childhood vaccination schedules, for children. ? Every 10 years, for adults who have diabetes.  The shingles vaccine is recommended for people who have had chicken pox and are 50 years or older. Mental and emotional health  Screening for symptoms of eating disorders, anxiety, and depression is recommended at the time of diagnosis and afterward as needed. If your screening shows that you have symptoms (you have a positive  screening result), you may need further evaluation and be referred to a mental health care provider. Diabetes self-management education  Education about how to manage your diabetes is recommended at diagnosis and ongoing as needed. Treatment plan  Your treatment plan will be reviewed at every medical visit. Summary  Managing diabetes (diabetes mellitus) can be complicated. Your diabetes treatment may be managed by a team of health care providers.  Your health care providers follow a schedule in order to help you get the best quality of care.  Standards of care including having regular physical exams, blood tests, blood pressure monitoring, immunizations, screening tests, and education about how to manage your diabetes.  Your health  care providers may also give you more specific instructions based on your individual health. This information is not intended to replace advice given to you by your health care provider. Make sure you discuss any questions you have with your health care provider. Document Released: 02/25/2009 Document Revised: 01/27/2016 Document Reviewed: 01/27/2016 Elsevier Interactive Patient Education  [employer].

## 2017-05-15 NOTE — Assessment & Plan Note (Signed)
Pt has traditionally seen [facility]. Jenny Reichmann but reports being advised to come here to [facility] instead for more affordable meds at this pharmacy location. Will send refill on all rx to this pharmacy and encouraged patient to follow with PCP he is most comfortable seeing to optimize his care. follow up 58months  Lab Results  Component Value Date   HGBA1C 10.1 (H) 04/27/2017

## 2017-05-15 NOTE — Assessment & Plan Note (Signed)
BP Readings from Last 3 Encounters:  05/15/17 128/85  04/27/17 (!) 162/87  03/13/17 (!) 148/90   Well controlled today The current medical regimen is effective;  continue present plan and medications. Refill to [facility] pharm as requested

## 2017-05-15 NOTE — Progress Notes (Signed)
Subjective:    Patient ID: Jon [name], male    DOB: 31-Jan-1956, 62 y.o.   MRN: [id number]  HPI  Patient here for hospital follow up - Seen 12/14-12/15 for right-sided weakness.  MRI performed confirming left posterior limb internal capsule stroke. Weakness has improved but slurring persists - Has not taken metformin since DC form hospital due to confusion on instructions and lack of refill availability per patient  Past Medical History:  Diagnosis Date  . Diabetes mellitus without complication ([place])    type II  . History of kidney stones   . Hyperlipidemia   . Hypertension   . Kidney stones   . Stomach ulcer     Review of Systems  Constitutional: Positive for activity change (increase physical activity: 3 mi walk yesterday) and fatigue. Negative for unexpected weight change.  Respiratory: Negative for chest tightness and shortness of breath.   Neurological: Positive for numbness. Seizures: R hand since CVA start 04/2017 but weakness improved.  Psychiatric/Behavioral: Negative for decreased concentration and dysphoric mood.       Objective:    Physical Exam  Constitutional: He is oriented to person, place, and time. He appears well-developed and well-nourished. No distress.  Cardiovascular: Normal rate, regular rhythm and normal heart sounds.  No murmur heard. Pulmonary/Chest: Effort normal and breath sounds normal. No respiratory distress.  Neurological: He is alert and oriented to person, place, and time. Coordination normal.  Slight slurring (dysarthria) but easily understood verbal communication  Psychiatric: He has a normal mood and affect. His behavior is normal.    BP 128/85 (BP Location: Left Arm, Patient Position: Sitting, Cuff Size: Normal)   Pulse 87   Temp 98.4 F (36.9 C) (Oral)   Resp 16   Ht 5\' 10"  (1.778 m)   Wt 202 lb 9.6 oz (91.9 kg)   SpO2 95%   BMI 29.07 kg/m  Wt Readings from Last 3 Encounters:  05/15/17 202 lb 9.6 oz (91.9 kg)  04/26/17 192  lb 3.9 oz (87.2 kg)  03/13/17 196 lb (88.9 kg)     Lab Results  Component Value Date   WBC 4.5 04/27/2017   HGB 11.7 (L) 04/27/2017   HCT 37.5 (L) 04/27/2017   PLT 245 04/27/2017   GLUCOSE 196 (H) 04/27/2017   CHOL 136 04/27/2017   TRIG 108 04/27/2017   HDL 31 (L) 04/27/2017   LDLCALC 83 04/27/2017   ALT 20 04/26/2017   AST 25 04/26/2017   NA 137 04/27/2017   K 3.9 04/27/2017   CL 102 04/27/2017   CREATININE 0.91 04/27/2017   BUN 9 04/27/2017   CO2 26 04/27/2017   TSH 1.604 04/27/2017   PSA 0.89 06/08/2016   INR 0.99 04/26/2017   HGBA1C 10.1 (H) 04/27/2017   MICROALBUR 10.6 (H) 06/08/2016    Ct Angio Head W Or Wo Contrast  Result Date: 04/27/2017 CLINICAL DATA:  Follow-up examination for acute stroke. EXAM: CT ANGIOGRAPHY HEAD AND NECK TECHNIQUE: Multidetector CT imaging of the head and neck was performed using the standard protocol during bolus administration of intravenous contrast. Multiplanar CT image reconstructions and MIPs were obtained to evaluate the vascular anatomy. Carotid stenosis measurements (when applicable) are obtained utilizing NASCET criteria, using the distal internal carotid diameter as the denominator. CONTRAST:  50mL ISOVUE-370 IOPAMIDOL (ISOVUE-370) INJECTION 76% COMPARISON:  Prior CT and MRI from 04/26/2017. FINDINGS: CTA NECK FINDINGS Aortic arch: Visualized aortic arch normal in caliber or with normal 3 vessel morphology. Minimal plaque within the arch itself.  No flow-limiting stenosis about the origin of the great vessels. Visualized subclavian artery is widely patent. Right carotid system: Right common carotid artery widely patent to the bifurcation. Mild mixed plaque about the right bifurcation/proximal right ICA with relatively mild stenosis of up to 35% by NASCET criteria. Right ICA widely patent distally to the skullbase without stenosis, dissection, or occlusion. Left carotid system: Left common carotid artery patent from its origin to the  bifurcation. Irregularity with superimposed small focal outpouching at the origin of the left ICA reflects penetrating plaque (series 7, image 197). Short-segment mild stenosis of up to 30% by NASCET criteria just distally within the proximal left ICA. Left ICA widely patent distally to the skullbase without stenosis, dissection, or occlusion. Vertebral arteries: Both of the vertebral artery is arise from the subclavian arteries. Left vertebral artery dominant. Pre foraminal left V1 segment tortuous with associated stenosis of up to approximately 70% (series 8, image 172). Additional multifocal mild to moderate stenoses present within the left V2 segment, most notable at the level of C5 (series 7, image 204). Left vertebral artery otherwise patent to the skullbase without stenosis or occlusion. Right vertebral artery diminutive bone widely patent to the skullbase without hemodynamically significant stenosis. Skeleton: No acute osseus abnormality. No worrisome lytic or blastic osseous lesions. Moderate cervical spondylolysis, most evident at C4-5 and C5-6. Other neck: No acute soft tissue abnormality within the neck. Salivary glands normal. Thyroid normal. No adenopathy. Upper chest: Visualized upper chest within normal limits. Partially visualized lungs are grossly clear. Review of the MIP images confirms the above findings CTA HEAD FINDINGS Anterior circulation: Internal carotid artery is patent to the termini without flow-limiting stenosis. A1 segments widely patent. Normal anterior communicating artery. Atheromatous irregularity within the ACAs without high-grade flow-limiting stenosis. M1 segments demonstrate mild atheromatous irregularity without flow-limiting stenosis. Normal MCA bifurcations. No proximal M2 occlusion. Distal small vessel irregularity within the MCA branches bilaterally. Posterior circulation: Dominant left V4 segment patent to the vertebrobasilar junction without flow-limiting stenosis. Right  V4 segment demonstrates mild multifocal narrowing without high-grade stenosis. Basilar artery widely patent to its distal aspect. Superior cerebral arteries patent bilaterally. Both of the posterior cerebral arteries primarily supplied via the basilar. Small right posterior communicating artery noted. No flow-limiting or proximal PCA stenosis. Distal small vessel atheromatous irregularity. Venous sinuses: Grossly patent. Anatomic variants: None significant. No aneurysm or vascular abnormality. Delayed phase: No abnormal enhancement. Small evolving acute ischemic left internal capsule/lateral left thalamus infarct noted. Review of the MIP images confirms the above findings IMPRESSION: 1. Negative CTA for large vessel occlusion. 2. Mild atheromatous stenoses of approximately 30-35% by NASCET criteria within the proximal ICAs bilaterally. 3. Short-segment severe 70% pre foraminal left V1 stenosis, with additional mild to moderate multifocal left V2 stenoses. Left vertebral artery is dominant. Diminutive right vertebral artery widely patent. 4. Distal small vessel atheromatous irregularity throughout the intracranial circulation. Electronically Signed   By: Jeannine Boga M.D.   On: 04/27/2017 04:12   Ct Head Wo Contrast  Result Date: 04/26/2017 CLINICAL DATA:  62 year old male with history of right-sided leg and arm weakness today. Slurred speech. EXAM: CT HEAD WITHOUT CONTRAST TECHNIQUE: Contiguous axial images were obtained from the base of the skull through the vertex without intravenous contrast. COMPARISON:  None. FINDINGS: Brain: Slightly ill-defined area of low attenuation associated with the posterior limb of the left internal capsule (axial image 16 of series 3) could reflect age-indeterminate ischemia. Patchy and confluent areas of decreased attenuation are noted throughout the  deep and periventricular white matter of the cerebral hemispheres bilaterally, compatible with chronic microvascular  ischemic disease. No evidence of acute hemorrhage, hydrocephalus, extra-axial collection or mass lesion/mass effect. Vascular: No hyperdense vessel or unexpected calcification. Skull: Normal. Negative for fracture or focal lesion. Sinuses/Orbits: Mild multifocal mucosal thickening in the paranasal sinuses without air-fluid levels. No acute finding. Other: None. IMPRESSION: 1. Subtle slightly ill-defined area of low attenuation associated with the posterior limb of the left internal capsule which could reflect age-indeterminate ischemia. If there is strong clinical concern for acute infarction, this could be further evaluated with MRI of the brain without contrast. 2. Chronic microvascular ischemic changes in the cerebral white matter. These results were called by telephone at the time of interpretation on 04/26/2017 at 9:33 am to Dr. Brantley Stage, who verbally acknowledged these results. Electronically Signed   By: Vinnie Langton M.D.   On: 04/26/2017 09:34   Ct Angio Neck W Or Wo Contrast  Result Date: 04/27/2017 CLINICAL DATA:  Follow-up examination for acute stroke. EXAM: CT ANGIOGRAPHY HEAD AND NECK TECHNIQUE: Multidetector CT imaging of the head and neck was performed using the standard protocol during bolus administration of intravenous contrast. Multiplanar CT image reconstructions and MIPs were obtained to evaluate the vascular anatomy. Carotid stenosis measurements (when applicable) are obtained utilizing NASCET criteria, using the distal internal carotid diameter as the denominator. CONTRAST:  43mL ISOVUE-370 IOPAMIDOL (ISOVUE-370) INJECTION 76% COMPARISON:  Prior CT and MRI from 04/26/2017. FINDINGS: CTA NECK FINDINGS Aortic arch: Visualized aortic arch normal in caliber or with normal 3 vessel morphology. Minimal plaque within the arch itself. No flow-limiting stenosis about the origin of the great vessels. Visualized subclavian artery is widely patent. Right carotid system: Right common carotid  artery widely patent to the bifurcation. Mild mixed plaque about the right bifurcation/proximal right ICA with relatively mild stenosis of up to 35% by NASCET criteria. Right ICA widely patent distally to the skullbase without stenosis, dissection, or occlusion. Left carotid system: Left common carotid artery patent from its origin to the bifurcation. Irregularity with superimposed small focal outpouching at the origin of the left ICA reflects penetrating plaque (series 7, image 197). Short-segment mild stenosis of up to 30% by NASCET criteria just distally within the proximal left ICA. Left ICA widely patent distally to the skullbase without stenosis, dissection, or occlusion. Vertebral arteries: Both of the vertebral artery is arise from the subclavian arteries. Left vertebral artery dominant. Pre foraminal left V1 segment tortuous with associated stenosis of up to approximately 70% (series 8, image 172). Additional multifocal mild to moderate stenoses present within the left V2 segment, most notable at the level of C5 (series 7, image 204). Left vertebral artery otherwise patent to the skullbase without stenosis or occlusion. Right vertebral artery diminutive bone widely patent to the skullbase without hemodynamically significant stenosis. Skeleton: No acute osseus abnormality. No worrisome lytic or blastic osseous lesions. Moderate cervical spondylolysis, most evident at C4-5 and C5-6. Other neck: No acute soft tissue abnormality within the neck. Salivary glands normal. Thyroid normal. No adenopathy. Upper chest: Visualized upper chest within normal limits. Partially visualized lungs are grossly clear. Review of the MIP images confirms the above findings CTA HEAD FINDINGS Anterior circulation: Internal carotid artery is patent to the termini without flow-limiting stenosis. A1 segments widely patent. Normal anterior communicating artery. Atheromatous irregularity within the ACAs without high-grade flow-limiting  stenosis. M1 segments demonstrate mild atheromatous irregularity without flow-limiting stenosis. Normal MCA bifurcations. No proximal M2 occlusion. Distal small vessel irregularity  within the MCA branches bilaterally. Posterior circulation: Dominant left V4 segment patent to the vertebrobasilar junction without flow-limiting stenosis. Right V4 segment demonstrates mild multifocal narrowing without high-grade stenosis. Basilar artery widely patent to its distal aspect. Superior cerebral arteries patent bilaterally. Both of the posterior cerebral arteries primarily supplied via the basilar. Small right posterior communicating artery noted. No flow-limiting or proximal PCA stenosis. Distal small vessel atheromatous irregularity. Venous sinuses: Grossly patent. Anatomic variants: None significant. No aneurysm or vascular abnormality. Delayed phase: No abnormal enhancement. Small evolving acute ischemic left internal capsule/lateral left thalamus infarct noted. Review of the MIP images confirms the above findings IMPRESSION: 1. Negative CTA for large vessel occlusion. 2. Mild atheromatous stenoses of approximately 30-35% by NASCET criteria within the proximal ICAs bilaterally. 3. Short-segment severe 70% pre foraminal left V1 stenosis, with additional mild to moderate multifocal left V2 stenoses. Left vertebral artery is dominant. Diminutive right vertebral artery widely patent. 4. Distal small vessel atheromatous irregularity throughout the intracranial circulation. Electronically Signed   By: Jeannine Boga M.D.   On: 04/27/2017 04:12   Mr Brain Wo Contrast  Result Date: 04/26/2017 CLINICAL DATA:  Right-sided weakness. EXAM: MRI HEAD WITHOUT CONTRAST TECHNIQUE: Multiplanar, multiecho pulse sequences of the brain and surrounding structures were obtained without intravenous contrast. COMPARISON:  Head CT from earlier today FINDINGS: Brain: ~1 cm acute infarct in the left posterior limb internal capsule/lateral  thalamus. Chronic microvascular ischemic type gliosis in the cerebral white matter. Remote small vessel infarcts in the bilateral cerebellum, left thalamus, and left pons. No hemorrhage, hydrocephalus, or masslike finding. Vascular: Major flow voids are preserved Skull and upper cervical spine: Negative for marrow lesion Sinuses/Orbits: Mild patchy mucosal thickening in the paranasal sinuses, likely with superimposed retention cysts. IMPRESSION: 1. Acute lacunar infarct in the posterior limb left internal capsule/lateral thalamus. 2. Chronic small vessel ischemia with remote small vessel infarcts described above. Electronically Signed   By: Monte Fantasia M.D.   On: 04/26/2017 11:08       Assessment & Plan:   Problem List Items Addressed This Visit    CVA (cerebral vascular accident) ([place])    L internal limb capsule acute CVA 04/2017 -  R side weakness improved, min persisting dysarthria and working with ST on same. Continue med management with Plavix antiplt and other med risk factor control       Diabetes ([place]) - Primary    Pt has traditionally seen [facility] Dr. Jenny Reichmann but reports being advised to come here to [facility] instead for more affordable meds at this pharmacy location. Will send refill on all rx to this pharmacy and encouraged patient to follow with PCP he is most comfortable seeing to optimize his care. follow up 41months  Lab Results  Component Value Date   HGBA1C 10.1 (H) 04/27/2017         Relevant Orders   Glucose (CBG) (Completed)   Dyslipidemia    Resume statin via refill to this pharmacy [facility]) per pt request - LDL last cal at 83, goal <70 with acute CVA 04/2017      HTN (hypertension)    BP Readings from Last 3 Encounters:  05/15/17 128/85  04/27/17 (!) 162/87  03/13/17 (!) 148/90   Well controlled today The current medical regimen is effective;  continue present plan and medications. Refill to [facility] pharm as requested          Gwendolyn Grant, MD

## 2017-05-16 ENCOUNTER — Ambulatory Visit: Payer: Self-pay | Attending: Internal Medicine | Admitting: Speech Pathology

## 2017-05-16 DIAGNOSIS — R471 Dysarthria and anarthria: Secondary | ICD-10-CM | POA: Insufficient documentation

## 2017-05-16 NOTE — Therapy (Signed)
[facility] [address] [place], [place], [address] Phone: [phone]   Fax:  [phone]  Speech Language Pathology Treatment  Patient Details  Name: Jon [name] MRN: [id number] Date of Birth: Nov 04, 1955 Referring Provider: Dr. Wynelle Cleveland   Encounter Date: 05/16/2017  End of Session - 05/16/17 1550    Visit Number  2    Number of Visits  9    Date for SLP Re-Evaluation  06/07/17    Authorization Type  self-pay    SLP Start Time  1450    SLP Stop Time   1533    SLP Time Calculation (min)  43 min    Activity Tolerance  Patient tolerated treatment well       Past Medical History:  Diagnosis Date  . Diabetes mellitus without complication ([place])    type II  . History of kidney stones   . Hyperlipidemia   . Hypertension   . Kidney stones   . Stomach ulcer     Past Surgical History:  Procedure Laterality Date  . CYSTOSCOPY Left 12/10/2016   Procedure: cystoscopy with left ureteral stone extraction ;  Surgeon: Kathie Rhodes, MD;  Location: [facility];  Service: Urology;  Laterality: Left;  . HERNIA REPAIR    . Stomach ulcer      There were no vitals filed for this visit.  Subjective Assessment - 05/16/17 1456    Subjective  "I've been doing a lot of talking."    Currently in Pain?  No/denies            ADULT SLP TREATMENT - 05/16/17 1456      General Information   Behavior/Cognition  Alert;Cooperative    Patient Positioning  Upright in chair      Treatment Provided   Treatment provided  Cognitive-Linquistic      Pain Assessment   Pain Assessment  No/denies pain      Cognitive-Linquistic Treatment   Treatment focused on  Dysarthria    Skilled Treatment  Pt reports he was frustrated during an exchange at his pharmacy and started slurring his words. Pt stated he had to walk away from the interaction and reattempt later. SLP educated pt re: worsening of speech under time pressure and in stressful  situations, praised pt for taking a break to calm down so that he could be understood. Pt demo'd HEP with occasional min A for pausing to improve intelligibility with tongue twisters. Utilized SLOP strategies in monosyllabic(100% intelligibility), multisyllabic words (95% intelligibility) and sentences 5-7 words (95%) with rare min A for slow rate. 20 minutes simple conversation with occasional min A for SLOP strategies, repeats x4 required.       Assessment / Recommendations / Plan   Plan  Continue with current plan of care      Progression Toward Goals   Progression toward goals  Progressing toward goals       SLP Education - 05/16/17 1549    Education provided  Yes    Education Details  reduce environmental pressures when speaking    Person(s) Educated  Patient    Methods  Explanation    Comprehension  Verbalized understanding         SLP Long Term Goals - 05/16/17 1552      SLP LONG TERM GOAL #1   Title  Pt will demo HEP for dysarthria with modified independence over 2 sessions.     Time  4    Period  Weeks  Status  On-going      SLP LONG TERM GOAL #2   Title  Pt will demo compensations for dysarthria in 15 minutes simple to mod complex conversation for >95% intelligibility over 2 sessions.    Time  4    Period  Weeks    Status  On-going      SLP LONG TERM GOAL #3   Title  Pt will report less frequent requests for repetition from communication partners.    Time  4    Period  Weeks    Status  On-going       Plan - 05/16/17 1551    Clinical Impression Statement  Patient presents with mild-moderate dysarthria and mildly reduced intelligibility in conversation. Repeats x4 required in simple conversation today. Continue skilled ST to improve intelligibility for communication at home and work.     Speech Therapy Frequency  2x / week    Duration  4 weeks    Treatment/Interventions  Compensatory strategies;SLP instruction and feedback;Patient/family education;Functional  tasks;Oral motor exercises    Potential to Achieve Goals  Good    Potential Considerations  Financial resources    SLP Home Exercise Plan  reviewed/provided SLOP strategies, HEP    Consulted and Agree with Plan of Care  Patient       Patient will benefit from skilled therapeutic intervention in order to improve the following deficits and impairments:   Dysarthria and anarthria    Problem List Patient Active Problem List   Diagnosis Date Noted  . CVA (cerebral vascular accident) ([place]) 04/26/2017  . Routine general medical examination at a health care facility 06/04/2016  . Trigger finger, acquired 02/24/2016  . Erectile dysfunction 03/17/2015  . Diabetes ([place]) 03/12/2015  . Dyslipidemia 03/12/2015  . HTN (hypertension) 03/12/2015   Deneise Lever, [place], [place] Speech-Language Pathologist  Aliene Altes 05/16/2017, 3:53 PM  [facility] [address] [place], [place], [address] Phone: [phone]   Fax:  [phone]   Name: Jon [name] MRN: [id number] Date of Birth: 1956-05-01

## 2017-05-23 ENCOUNTER — Other Ambulatory Visit: Payer: Self-pay

## 2017-05-23 ENCOUNTER — Ambulatory Visit: Payer: Self-pay | Admitting: Speech Pathology

## 2017-05-23 DIAGNOSIS — R471 Dysarthria and anarthria: Secondary | ICD-10-CM

## 2017-05-23 NOTE — Therapy (Signed)
[facility] [address] [place], [place], [address] Phone: [phone]   Fax:  [phone]  Speech Language Pathology Treatment  Patient Details  Name: Jon [name] MRN: [id number] Date of Birth: March 01, 1956 Referring Provider: Dr. Wynelle Cleveland   Encounter Date: 05/23/2017  End of Session - 05/23/17 1553    Visit Number  3    Number of Visits  9    Date for SLP Re-Evaluation  06/07/17    Authorization Type  self-pay    SLP Start Time  1318    SLP Stop Time   1400    SLP Time Calculation (min)  42 min    Activity Tolerance  Patient tolerated treatment well       Past Medical History:  Diagnosis Date  . Diabetes mellitus without complication ([place])    type II  . History of kidney stones   . Hyperlipidemia   . Hypertension   . Kidney stones   . Stomach ulcer     Past Surgical History:  Procedure Laterality Date  . CYSTOSCOPY Left 12/10/2016   Procedure: cystoscopy with left ureteral stone extraction ;  Surgeon: Kathie Rhodes, MD;  Location: [facility];  Service: Urology;  Laterality: Left;  . HERNIA REPAIR    . Stomach ulcer      There were no vitals filed for this visit.  Subjective Assessment - 05/23/17 1323    Subjective  "I hope it is getting better."    Currently in Pain?  No/denies            ADULT SLP TREATMENT - 05/23/17 1318      General Information   Behavior/Cognition  Alert;Cooperative    Patient Positioning  Upright in chair      Treatment Provided   Treatment provided  Cognitive-Linquistic      Pain Assessment   Pain Assessment  No/denies pain      Cognitive-Linquistic Treatment   Treatment focused on  Dysarthria    Skilled Treatment  Pt demo'd HEP for dysarthria with modified independence (handout). Pt reports being more aware of his speech and of making efforts to use intelligibility strategies outside of ST room. He reports he has noticed he sometimes reduces his volume when  attempting to slow his rate. SLP reviewed SLOP strategies and facilitated use of strategies, with focus on louder speech as pt generated sentences. Pt averaging in mid 70s dB at 30 cm with initial instruction for louder speech. Progressed to conversation level. In 25 minute moderately complex conversation, pt was 95% intelligible, requiring verbal cues x2 for slower rate/pauses and x1 for overarticulation.       Assessment / Recommendations / Plan   Plan  Continue with current plan of care      Progression Toward Goals   Progression toward goals  Progressing toward goals conversation goal upgraded       SLP Education - 05/23/17 1552    Education provided  Yes    Education Details  louder speech for improved intelligibility    Person(s) Educated  Patient    Methods  Explanation    Comprehension  Verbalized understanding         SLP Long Term Goals - 05/23/17 1555      SLP LONG TERM GOAL #1   Title  Pt will demo HEP for dysarthria with modified independence over 2 sessions.     Time  3    Period  Weeks    Status  On-going  SLP LONG TERM GOAL #2   Title  Pt will demo compensations for dysarthria in 20 minutes mod complex conversation for >95% intelligibility over 2 sessions.    Baseline  05/23/17    Time  3    Period  Weeks    Status  Revised      SLP LONG TERM GOAL #3   Title  Pt will report less frequent requests for repetition from communication partners.    Time  3    Period  Weeks    Status  On-going       Plan - 05/23/17 1553    Clinical Impression Statement  Patient presents with mild-moderate dysarthria and mildly reduced intelligibility in conversation. Repeats x3 with cues for slowed rate, overarticulation required in moderately complex conversation today. Continue skilled ST to improve intelligibility for communication at home and work. Pt making good progress; consider decreasing frequency to 1x per week.    Speech Therapy Frequency  2x / week    Duration  4  weeks    Treatment/Interventions  Compensatory strategies;SLP instruction and feedback;Patient/family education;Functional tasks;Oral motor exercises    Potential to Achieve Goals  Good    Potential Considerations  Financial resources    SLP Home Exercise Plan  reviewed/provided SLOP strategies, HEP    Consulted and Agree with Plan of Care  Patient       Patient will benefit from skilled therapeutic intervention in order to improve the following deficits and impairments:   Dysarthria and anarthria    Problem List Patient Active Problem List   Diagnosis Date Noted  . CVA (cerebral vascular accident) ([place]) 04/26/2017  . Routine general medical examination at a health care facility 06/04/2016  . Trigger finger, acquired 02/24/2016  . Erectile dysfunction 03/17/2015  . Diabetes ([place]) 03/12/2015  . Dyslipidemia 03/12/2015  . HTN (hypertension) 03/12/2015   Jon [name], [place], [place] Speech-Language Pathologist  Aliene Altes 05/23/2017, 3:57 PM  [facility] [address] [place], [place], [address] Phone: [phone]   Fax:  [phone]   Name: Jon [name] MRN: [id number] Date of Birth: 02/19/1956

## 2017-05-24 ENCOUNTER — Ambulatory Visit: Payer: Self-pay

## 2017-05-24 DIAGNOSIS — R471 Dysarthria and anarthria: Secondary | ICD-10-CM

## 2017-05-24 NOTE — Therapy (Signed)
[facility] [address] [place], [place], [address] Phone: [phone]   Fax:  [phone]  Speech Language Pathology Treatment  Patient Details  Name: Jon [name] MRN: [id number] Date of Birth: [birth year] Referring Provider: Dr. Wynelle Cleveland   Encounter Date: 05/24/2017  End of Session - 05/24/17 1618    Visit Number  4    Number of Visits  9    Date for SLP Re-Evaluation  06/07/17    Authorization Type  self-pay    SLP Start Time  1534    SLP Stop Time   1610    SLP Time Calculation (min)  36 min       Past Medical History:  Diagnosis Date  . Diabetes mellitus without complication ([place])    type II  . History of kidney stones   . Hyperlipidemia   . Hypertension   . Kidney stones   . Stomach ulcer     Past Surgical History:  Procedure Laterality Date  . CYSTOSCOPY Left 12/10/2016   Procedure: cystoscopy with left ureteral stone extraction ;  Surgeon: Kathie Rhodes, MD;  Location: [facility];  Service: Urology;  Laterality: Left;  . HERNIA REPAIR    . Stomach ulcer      There were no vitals filed for this visit.  Subjective Assessment - 05/24/17 1537    Subjective  Pt arrives with HEP.    Currently in Pain?  No/denies            ADULT SLP TREATMENT - 05/24/17 1538      General Information   Behavior/Cognition  Alert;Cooperative      Treatment Provided   Treatment provided  Cognitive-Linquistic      Cognitive-Linquistic Treatment   Treatment focused on  Dysarthria    Skilled Treatment  "It's a little bit cultural for me, talking fast." Pt completed HEP with independence. He generated sentences with two-three multisyllablic words with 56%+ intelligibility. In a 25 minute mod complex/complex conversation pt was intelligible 95%+, and req'd SLP min A once for clarification of articulation.      Assessment / Recommendations / Plan   Plan  -- x1/week due to progress      Progression Toward Goals   Progression toward goals  Progressing toward goals       SLP Education - 05/23/17 1552    Education provided  Yes    Education Details  louder speech for improved intelligibility    Person(s) Educated  Patient    Methods  Explanation    Comprehension  Verbalized understanding         SLP Long Term Goals - 05/24/17 1621      SLP LONG TERM GOAL #1   Title  Pt will demo HEP for dysarthria with modified independence over 2 sessions.     Baseline  05-24-17    Time  3    Period  Weeks    Status  On-going      SLP LONG TERM GOAL #2   Title  Pt will demo compensations for dysarthria in 20 minutes mod complex conversation for >95% intelligibility over 2 sessions.    Status  Achieved      SLP LONG TERM GOAL #3   Title  Pt will report less frequent requests for repetition from communication partners.    Time  3    Period  Weeks    Status  On-going       Plan - 05/24/17 3875  Clinical Impression Statement  Patient presents with mildly reduced intelligibility in conversation, however intelligibility is functional/WNL at this time. Pt was given option to reduce frequency (due to progress) to x1/week or x1/every other week and chose once/week. Continue skilled ST to improve intelligibility for communication at home and work. Pt continues to make good progress, suspect d/c in next 1-2 visits.    Speech Therapy Frequency  1x /week    Duration  4 weeks       Patient will benefit from skilled therapeutic intervention in order to improve the following deficits and impairments:   Dysarthria and anarthria    Problem List Patient Active Problem List   Diagnosis Date Noted  . CVA (cerebral vascular accident) ([place]) 04/26/2017  . Routine general medical examination at a health care facility 06/04/2016  . Trigger finger, acquired 02/24/2016  . Erectile dysfunction 03/17/2015  . Diabetes ([place]) 03/12/2015  . Dyslipidemia 03/12/2015  . HTN (hypertension) 03/12/2015    [facility] ,MS,  [place]  05/24/2017, 4:21 PM  [facility] [address] [place], [place], [address] Phone: [phone]   Fax:  [phone]   Name: Jon [name] MRN: [id number] Date of Birth: [birth year]

## 2017-05-27 ENCOUNTER — Ambulatory Visit: Payer: Self-pay

## 2017-05-30 ENCOUNTER — Ambulatory Visit: Payer: Self-pay | Admitting: Speech Pathology

## 2017-05-30 DIAGNOSIS — R471 Dysarthria and anarthria: Secondary | ICD-10-CM

## 2017-05-30 NOTE — Therapy (Signed)
[facility] [address] [place], [place], [address] Phone: [phone]   Fax:  [phone]  Speech Language Pathology Treatment  Patient Details  Name: Jon [name] MRN: [id number] Date of Birth: 04-10-1956 Referring Provider: Dr. Wynelle Cleveland   Encounter Date: 05/30/2017  End of Session - 05/30/17 1156    Visit Number  5    Number of Visits  9    Date for SLP Re-Evaluation  06/07/17    Authorization Type  self-pay    SLP Start Time  1102    SLP Stop Time   1138    SLP Time Calculation (min)  36 min    Activity Tolerance  Patient tolerated treatment well       Past Medical History:  Diagnosis Date  . Diabetes mellitus without complication ([place])    type II  . History of kidney stones   . Hyperlipidemia   . Hypertension   . Kidney stones   . Stomach ulcer     Past Surgical History:  Procedure Laterality Date  . CYSTOSCOPY Left 12/10/2016   Procedure: cystoscopy with left ureteral stone extraction ;  Surgeon: Kathie Rhodes, MD;  Location: [facility];  Service: Urology;  Laterality: Left;  . HERNIA REPAIR    . Stomach ulcer      There were no vitals filed for this visit.  Subjective Assessment - 05/30/17 1100    Subjective  "I've been monitoring myself... you've been in my head."    Currently in Pain?  No/denies            ADULT SLP TREATMENT - 05/30/17 1102      General Information   Behavior/Cognition  Alert;Cooperative    Patient Positioning  Upright in chair      Treatment Provided   Treatment provided  Cognitive-Linquistic      Pain Assessment   Pain Assessment  No/denies pain      Cognitive-Linquistic Treatment   Treatment focused on  Dysarthria    Skilled Treatment  Pt demo'd HEP indepedently. He told SLP he has been monitoring his own speech for slurring and states he has not had any requests for repeats since his last session. SLP facilitated 25 minute mod complex/complex conversation  outside [place] room in noisy gym. Pt was 95%+ intelligible; min A once for clarification of articulation and single reminder to slow rate. Pt also fielded 3 phone calls for work, giving instructions re: locations and times without requests for repetition.      Assessment / Recommendations / Plan   Plan  Continue with current plan of care      Progression Toward Goals   Progression toward goals  Goals met and updated    Potential to Achieve Goals  Good           SLP Long Term Goals - 05/30/17 1102      SLP LONG TERM GOAL #1   Title  Pt will demo HEP for dysarthria with modified independence over 2 sessions.     Time  2    Period  Weeks    Status  Achieved      SLP LONG TERM GOAL #2   Title  Pt will demo compensations for dysarthria in 20 minutes mod complex conversation outside [place] room for >95% intelligibility over 2 sessions.    Baseline  05/30/17    Time  2    Period  Weeks    Status  Revised  SLP LONG TERM GOAL #3   Title  Pt will report less frequent requests for repetition from communication partners.    Time  2    Period  Weeks    Status  Achieved       Plan - 05/30/17 1157    Clinical Impression Statement  Patient presents with mildly reduced intelligibility in conversation, however intelligibility is functional/WNL at this time. Continue skilled ST to improve intelligibility for communication at home and work. Goals met and updated for intelligibility outside ST room. Pt continues to make good progress, suspect d/c next session.     Speech Therapy Frequency  1x /week    Duration  4 weeks    Treatment/Interventions  Compensatory strategies;SLP instruction and feedback;Patient/family education;Functional tasks;Oral motor exercises    Potential to Achieve Goals  Good    Potential Considerations  Financial resources    SLP Home Exercise Plan  reviewed/provided SLOP strategies, HEP    Consulted and Agree with Plan of Care  Patient       Patient will benefit from  skilled therapeutic intervention in order to improve the following deficits and impairments:   Dysarthria and anarthria    Problem List Patient Active Problem List   Diagnosis Date Noted  . CVA (cerebral vascular accident) ([place]) 04/26/2017  . Routine general medical examination at a health care facility 06/04/2016  . Trigger finger, acquired 02/24/2016  . Erectile dysfunction 03/17/2015  . Diabetes ([place]) 03/12/2015  . Dyslipidemia 03/12/2015  . HTN (hypertension) 03/12/2015   Deneise Lever, [place], CCC-SLP Speech-Language Pathologist  Aliene Altes 05/30/2017, 11:59 AM  [facility] [address] [place], [place], [address] Phone: [phone]   Fax:  [phone]   Name: Jon [name] MRN: [id number] Date of Birth: 1955-11-15

## 2017-06-03 ENCOUNTER — Encounter: Payer: Self-pay | Admitting: Speech Pathology

## 2017-06-06 ENCOUNTER — Ambulatory Visit: Payer: Self-pay | Admitting: Speech Pathology

## 2017-06-06 DIAGNOSIS — R471 Dysarthria and anarthria: Secondary | ICD-10-CM

## 2017-06-06 NOTE — Therapy (Signed)
[facility] [address] [place], [place], [address] Phone: [phone]   Fax:  [phone]  Speech Language Pathology Treatment and Discharge Summary  Patient Details  Name: Jon [name] MRN: [id number] Date of Birth: [birth year] Referring Provider: Dr. Wynelle Cleveland   Encounter Date: 06/06/2017  End of Session - 06/06/17 1740    Visit Number  6    Number of Visits  9    Date for SLP Re-Evaluation  06/07/17    Authorization Type  self-pay    SLP Start Time  1448    SLP Stop Time   1516    SLP Time Calculation (min)  28 min    Activity Tolerance  Patient tolerated treatment well       Past Medical History:  Diagnosis Date  . Diabetes mellitus without complication ([facility])    type II  . History of kidney stones   . Hyperlipidemia   . Hypertension   . Kidney stones   . Stomach ulcer     Past Surgical History:  Procedure Laterality Date  . CYSTOSCOPY Left 12/10/2016   Procedure: cystoscopy with left ureteral stone extraction ;  Surgeon: Kathie Rhodes, MD;  Location: [facility];  Service: Urology;  Laterality: Left;  . HERNIA REPAIR    . Stomach ulcer      There were no vitals filed for this visit.  Subjective Assessment - 06/06/17 1449    Subjective  Pt reports he had an articulatory breakdown on the phone with his sister but handled it well    Currently in Pain?  No/denies            ADULT SLP TREATMENT - 06/06/17 1450      General Information   Behavior/Cognition  Alert;Cooperative    Patient Positioning  Upright in chair      Treatment Provided   Treatment provided  Cognitive-Linquistic      Pain Assessment   Pain Assessment  No/denies pain      Cognitive-Linquistic Treatment   Treatment focused on  Dysarthria    Skilled Treatment  Pt reports articulatory breakdown over the phone with his sister but states he did not get frustrated and was able to convey his message. SLP facilitated 20 minutes  conversation in noisy environment; pt >95% intelligible, clarification needed on single occasion.       Assessment / Recommendations / Plan   Plan  All goals met;Discharge SLP treatment due to (comment)      Progression Toward Goals   Progression toward goals  Goals met, education completed, patient discharged from SLP    Potential to [place] Term Goals - 06/06/17 [place] #1   Title  Pt will demo HEP for dysarthria with modified independence over 2 sessions.     Baseline  05-24-17    Period  Weeks    Status  Achieved      SLP LONG TERM GOAL #2   Title  Pt will demo compensations for dysarthria in 20 minutes mod complex conversation outside [facility] [place] room for >95% intelligibility over 2 sessions.    Baseline  05/30/17, 06/06/17    Time  1    Period  Weeks    Status  Achieved      SLP LONG TERM GOAL #3   Title  Pt will report less frequent requests for repetition from communication  partners.    Period  Weeks    Status  Achieved       Plan - 06/06/17 1741    Clinical Impression Statement  Patient presents with mildly reduced intelligibility in conversation, however intelligibility is functional/WNL at this time. Pt demo's good carryover of compensatory strategies for dysarthria in conversations outside [facility] [place] room and while using the phone for work. Long term goals met; pt in agreement with d/c at this time.     Speech Therapy Frequency  -- d/c    Duration  -- d/c    Treatment/Interventions  Compensatory strategies;SLP instruction and feedback;Patient/family education;Functional tasks;Oral motor exercises    Potential to Achieve Goals  Good    Potential Considerations  Financial resources    SLP Home Exercise Plan  reviewed/provided SLOP strategies, HEP    Consulted and Agree with Plan of Care  Patient       Patient will benefit from skilled therapeutic intervention in order to improve the following deficits and impairments:    Dysarthria and anarthria    Problem List Patient Active Problem List   Diagnosis Date Noted  . CVA (cerebral vascular accident) ([place]) 04/26/2017  . Routine general medical examination at a health care facility 06/04/2016  . Trigger finger, acquired 02/24/2016  . Erectile dysfunction 03/17/2015  . Diabetes ([place]) 03/12/2015  . Dyslipidemia 03/12/2015  . HTN (hypertension) 03/12/2015   SPEECH THERAPY DISCHARGE SUMMARY  Visits from Start of Care: 6  Current functional level related to goals / functional outcomes: See goals above. Pt intelligibility >95% in moderately complex-complex conversation.   Remaining deficits: Pt may require rare cues to repeat himself for clarification in some settings.   Education / Equipment: Pt educated in compensatory strategies for dysarthria Plan: Patient agrees to discharge.  Patient goals were met. Patient is being discharged due to meeting the stated rehab goals.  ?????        Deneise Lever, [place], CCC-SLP Speech-Language Pathologist  Aliene Altes 06/06/2017, 5:43 PM  [facility] [address] [place], [place], [address] Phone: [phone]   Fax:  [phone]   Name: Jon [name] MRN: [id number] Date of Birth: May 11, 1956

## 2017-06-08 ENCOUNTER — Other Ambulatory Visit: Payer: Self-pay | Admitting: Endocrinology

## 2017-06-08 NOTE — Telephone Encounter (Signed)
Please refill x 1 Ov is due  

## 2017-06-10 NOTE — Telephone Encounter (Signed)
Patient needs appointment for further refills

## 2017-06-17 MED FILL — METFORMIN HCL ER 500 MG TAB: 500 | 30 days supply | Qty: 60 | Fill #1

## 2017-06-18 ENCOUNTER — Ambulatory Visit: Payer: Self-pay

## 2017-06-27 MED FILL — ?GLIMEPIRIDE 4 MG TABLET: 4 | 30 days supply | Qty: 60 | Fill #1

## 2017-06-27 MED FILL — LISINOPRIL 40 MG TAB: 40 | 30 days supply | Qty: 30 | Fill #1

## 2017-06-27 MED FILL — AMLODIPINE BESYLATE 10 MG T: 10 | 30 days supply | Qty: 30 | Fill #1

## 2017-06-27 MED FILL — ?CLOPIDOGREL 75MG TA: 75 | 30 days supply | Qty: 30 | Fill #1

## 2017-07-17 ENCOUNTER — Ambulatory Visit: Payer: Self-pay | Admitting: Neurology

## 2017-07-18 MED FILL — METFORMIN HCL ER 500 MG TAB: 500 | 30 days supply | Qty: 60 | Fill #2

## 2017-07-22 MED FILL — CLOPIDOGREL 75 MG TABLET: 75 | 30 days supply | Qty: 30 | Fill #2

## 2017-07-22 MED FILL — GLIMEPIRIDE 4 MG TABLET: 4 | 30 days supply | Qty: 60 | Fill #2

## 2017-08-01 ENCOUNTER — Other Ambulatory Visit: Payer: Self-pay | Admitting: Endocrinology

## 2017-08-01 MED FILL — AMLODIPINE BESYLATE 10 MG T: 10 | 30 days supply | Qty: 30 | Fill #2

## 2017-08-05 ENCOUNTER — Emergency Department (HOSPITAL_COMMUNITY): Payer: Self-pay

## 2017-08-05 ENCOUNTER — Emergency Department (HOSPITAL_COMMUNITY)
Admission: EM | Admit: 2017-08-05 | Discharge: 2017-08-05 | Disposition: A | Discharge status: Home / Self Care | Payer: Self-pay | Attending: Emergency Medicine | Admitting: Emergency Medicine

## 2017-08-05 DIAGNOSIS — Z79899 Other long term (current) drug therapy: Secondary | ICD-10-CM | POA: Insufficient documentation

## 2017-08-05 DIAGNOSIS — M25512 Pain in left shoulder: Secondary | ICD-10-CM | POA: Insufficient documentation

## 2017-08-05 DIAGNOSIS — I10 Essential (primary) hypertension: Secondary | ICD-10-CM | POA: Insufficient documentation

## 2017-08-05 DIAGNOSIS — Z7902 Long term (current) use of antithrombotics/antiplatelets: Secondary | ICD-10-CM | POA: Insufficient documentation

## 2017-08-05 DIAGNOSIS — Z8673 Personal history of transient ischemic attack (TIA), and cerebral infarction without residual deficits: Secondary | ICD-10-CM | POA: Insufficient documentation

## 2017-08-05 DIAGNOSIS — Z7984 Long term (current) use of oral hypoglycemic drugs: Secondary | ICD-10-CM | POA: Insufficient documentation

## 2017-08-05 DIAGNOSIS — E119 Type 2 diabetes mellitus without complications: Secondary | ICD-10-CM | POA: Insufficient documentation

## 2017-08-05 LAB — BASIC METABOLIC PANEL
ANION GAP: 10 (ref 5–15)
BUN: 16 mg/dL (ref 6–20)
CALCIUM: 8.9 mg/dL (ref 8.9–10.3)
CO2: 20 mmol/L — AB (ref 22–32)
CREATININE: 0.87 mg/dL (ref 0.61–1.24)
Chloride: 105 mmol/L (ref 101–111)
Glucose, Bld: 308 mg/dL — ABNORMAL HIGH (ref 65–99)
Potassium: 4 mmol/L (ref 3.5–5.1)
Sodium: 135 mmol/L (ref 135–145)

## 2017-08-05 LAB — I-STAT TROPONIN, ED: TROPONIN I, POC: 0.01 ng/mL (ref 0.00–0.08)

## 2017-08-05 LAB — CBC
HCT: 40.9 % (ref 39.0–52.0)
Hemoglobin: 12.8 g/dL — ABNORMAL LOW (ref 13.0–17.0)
MCH: 24.8 pg — AB (ref 26.0–34.0)
MCHC: 31.3 g/dL (ref 30.0–36.0)
MCV: 79.1 fL (ref 78.0–100.0)
PLATELETS: 278 10*3/uL (ref 150–400)
RBC: 5.17 MIL/uL (ref 4.22–5.81)
RDW: 14.9 % (ref 11.5–15.5)
WBC: 5.6 10*3/uL (ref 4.0–10.5)

## 2017-08-05 MED ORDER — METHOCARBAMOL 500 MG PO TABS: 500.0000 mg | ORAL_TABLET | Freq: Two times a day (BID) | ORAL | 0 refills | Status: DC

## 2017-08-05 MED ORDER — IBUPROFEN 200 MG PO TABS
600.0000 mg | ORAL_TABLET | Freq: Once | ORAL | Status: AC
  Administered 2017-08-05: 19:00:00 600 mg via ORAL
  Filled 2017-08-05: qty 1

## 2017-08-05 MED ORDER — LIDOCAINE 5 % EX PTCH: 1.0000 | MEDICATED_PATCH | CUTANEOUS | 0 refills | Status: DC

## 2017-08-05 MED ORDER — HYDROCODONE-ACETAMINOPHEN 5-325 MG PO TABS
1.0000 | ORAL_TABLET | Freq: Once | ORAL | Status: AC
  Administered 2017-08-05: 1 via ORAL
  Filled 2017-08-05: qty 1

## 2017-08-05 MED ORDER — HYDROCODONE-ACETAMINOPHEN 5-325 MG PO TABS: 1.0000 | ORAL_TABLET | ORAL | 0 refills | Status: DC | PRN

## 2017-08-05 MED FILL — ATORVASTATIN 40 MG TABLET: 40 | 30 days supply | Qty: 30 | Fill #1

## 2017-08-05 NOTE — Discharge Instructions (Addendum)
°  Expect your soreness to increase over the next 2-3 days. Take it easy, but do not lay around too much as this may make any stiffness worse.  Antiinflammatory medications: Take 600 mg of ibuprofen every 6 hours or 440 mg (over the counter dose) to 500 mg (prescription dose) of naproxen every 12 hours for the next 3 days. After this time, these medications may be used as needed for pain. Take these medications with food to avoid upset stomach. Choose only one of these medications, do not take them together.  Tylenol: Should you continue to have additional pain while taking the ibuprofen or naproxen, you may add in tylenol as needed. Your daily total maximum amount of tylenol from all sources should be limited to 4000mg /day for persons without liver problems, or 2000mg /day for those with liver problems. Vicodin: May take Vicodin as needed for severe pain.  Do not drive or perform other dangerous activities while taking the Vicodin.  Please note that each pill of Vicodin contains 325 mg of Tylenol and the above dosage limits apply. Muscle relaxer: Robaxin is a muscle relaxer and may help loosen stiff muscles. Do not take the Robaxin while driving or performing other dangerous activities.  Lidocaine patches: These are available via either prescription or over-the-counter. The over-the-counter option may be more economical one and are likely just as effective. There are multiple over-the-counter brands, such as Salonpas. Exercises: Be sure to perform the attached exercises starting with three times a week and working up to performing them daily. This is an essential part of preventing long term problems.   Follow up with a primary care provider or orthopedic specialist for any future management of these complaints.

## 2017-08-05 NOTE — ED Notes (Signed)
ED Provider at bedside. Alvino Chapel

## 2017-08-05 NOTE — ED Provider Notes (Signed)
[facility] EMERGENCY DEPARTMENT Provider Note   CSN: [id number] Arrival date & time: 08/05/17  1143     History   Chief Complaint Chief Complaint  Patient presents with  . Chest Pain    [name] is a 62 y.o. male.  HPI   Jon [name] is a 62 y.o. male, with a history of DM, HTN, CVA, presenting to the ED with left shoulder pain for the last week. Pain is constant, described as a tightness, 8/10, occasionally radiating down the left arm. Pain worsens with some movements of the left arm at the shoulder. Pain also worsens with long periods of arm extension, such as when patient is driving, as he does for his job.  He states pain improves when his wife massages the area. Denies chest pain, shortness of breath, numbness, weakness, fever/chills, dizziness, diaphoresis, falls/trauma, nausea/vomiting, or any other complaints.    Past Medical History:  Diagnosis Date  . Diabetes mellitus without complication ([place])    type II  . History of kidney stones   . Hyperlipidemia   . Hypertension   . Kidney stones   . Stomach ulcer     Patient Active Problem List   Diagnosis Date Noted  . CVA (cerebral vascular accident) ([place]) 04/26/2017  . Routine general medical examination at a health care facility 06/04/2016  . Trigger finger, acquired 02/24/2016  . Erectile dysfunction 03/17/2015  . Diabetes ([place]) 03/12/2015  . Dyslipidemia 03/12/2015  . HTN (hypertension) 03/12/2015    Past Surgical History:  Procedure Laterality Date  . CYSTOSCOPY Left 12/10/2016   Procedure: cystoscopy with left ureteral stone extraction ;  Surgeon: Kathie Rhodes, MD;  Location: [facility];  Service: Urology;  Laterality: Left;  . HERNIA REPAIR    . Stomach ulcer          Home Medications    Prior to Admission medications   Medication Sig Start Date End Date Taking? Authorizing Provider  amLODipine (NORVASC) 10 MG tablet Take 1 tablet (10 mg total) by mouth daily.  05/15/17   Rowe Clack, MD  atorvastatin (LIPITOR) 40 MG tablet Take 1 tablet (40 mg total) by mouth daily. 05/15/17   Rowe Clack, MD  Blood Glucose Monitoring Suppl (ONETOUCH VERIO) w/Device KIT 1 Device by Does not apply route once. 08/01/15   Renato Shin, MD  clopidogrel (PLAVIX) 75 MG tablet Take 1 tablet (75 mg total) by mouth daily. 05/15/17   Rowe Clack, MD  glimepiride (AMARYL) 4 MG tablet Take 2 tablets (8 mg total) by mouth daily with breakfast. 05/15/17   Rowe Clack, MD  glucose blood (ONETOUCH VERIO) test strip 1 each by Other route daily. And lancets 1/day 08/01/15   Renato Shin, MD  HYDROcodone-acetaminophen (NORCO/VICODIN) 5-325 MG tablet Take 1-2 tablets by mouth every 4 (four) hours as needed for severe pain. 08/05/17   Jeanann Balinski C, PA-C  lidocaine (LIDODERM) 5 % Place 1 patch onto the skin daily. Remove & Discard patch within 12 hours or as directed by MD 08/05/17   Deanne Bedgood C, PA-C  lisinopril (PRINIVIL,ZESTRIL) 40 MG tablet Take 1 tablet (40 mg total) by mouth daily. 05/15/17   Rowe Clack, MD  metFORMIN (GLUCOPHAGE XR) 500 MG 24 hr tablet Take 1 tablet (500 mg total) by mouth 2 (two) times daily. 05/15/17   Rowe Clack, MD  metFORMIN (GLUCOPHAGE-XR) 500 MG 24 hr tablet TAKE 2 TABLETS BY MOUTH TWICE DAILY WITH A MEAL 08/02/17  Renato Shin, MD  methocarbamol (ROBAXIN) 500 MG tablet Take 1 tablet (500 mg total) by mouth 2 (two) times daily. 08/05/17   Trevion Hoben C, PA-C  ONETOUCH DELICA LANCETS 83M MISC Use to check blood sugar 1 time per day. DX Code: E11.9 08/02/15   Renato Shin, MD    Family History Family History  Problem Relation Age of Onset  . Diabetes Father   . Stroke Father   . Hypertension Father   . Hypertension Mother   . Diabetes Sister   . Diabetes Son   . Diabetes Daughter     Social History Social History   Tobacco Use  . Smoking status: Never Smoker  . Smokeless tobacco: Never Used  Substance Use Topics    . Alcohol use: Yes    Alcohol/week: 0.6 - 1.2 oz    Types: 1 - 2 Glasses of wine per week    Comment: socially  . Drug use: No     Allergies   Patient has no known allergies.   Review of Systems Review of Systems  Constitutional: Negative for chills, diaphoresis and fever.  Respiratory: Negative for cough and shortness of breath.   Cardiovascular: Negative for chest pain and palpitations.  Gastrointestinal: Negative for abdominal pain, diarrhea, nausea and vomiting.  Musculoskeletal: Positive for arthralgias.  Neurological: Negative for weakness and numbness.  All other systems reviewed and are negative.    Physical Exam Updated Vital Signs BP (!) 157/94   Pulse 93   Temp 98.3 F (36.8 C)   Resp 17   SpO2 97%   Physical Exam  Constitutional: He appears well-developed and well-nourished. No distress.  HENT:  Head: Normocephalic and atraumatic.  Eyes: Conjunctivae are normal.  Neck: Neck supple.  Cardiovascular: Normal rate, regular rhythm, normal heart sounds and intact distal pulses.  Pulmonary/Chest: Effort normal and breath sounds normal. No respiratory distress.  Abdominal: Soft. There is no tenderness. There is no guarding.  Musculoskeletal: He exhibits tenderness. He exhibits no edema or deformity.       Arms: Patient has tenderness to the left posterior shoulder.  Increased pain with touching the contralateral shoulder with the left hand and raising the left elbow.  Increased pain with external and internal rotation. Patient has full active and passive range of motion in the left shoulder.  No noted laxity, deformity, or crepitus.  No swelling or increased warmth.  Lymphadenopathy:    He has no cervical adenopathy.  Neurological: He is alert.  No sensory deficits noted. Abduction and adduction of the fingers intact against resistance. Grip strength equal bilaterally. Strength 5/5 through the cardinal directions of the bilateral wrists. Strength 5/5 with  flexion and extension of the bilateral elbows. Strength 5/5 through the cardinal directions of the bilateral shoulders. Patient can touch the thumb to each one of the fingertips without difficulty.  Coordination intact with finger to nose testing.  Strength 5/5 in lower extremities.  No gait disturbance. Cranial nerves III-XII grossly intact.  No facial droop.   Skin: Skin is warm and dry. Capillary refill takes less than 2 seconds. He is not diaphoretic.  Psychiatric: He has a normal mood and affect. His behavior is normal.  Nursing note and vitals reviewed.    ED Treatments / Results  Labs (all labs ordered are listed, but only abnormal results are displayed) Labs Reviewed  BASIC METABOLIC PANEL - Abnormal; Notable for the following components:      Result Value   CO2 20 (*)  Glucose, Bld 308 (*)    All other components within normal limits  CBC - Abnormal; Notable for the following components:   Hemoglobin 12.8 (*)    MCH 24.8 (*)    All other components within normal limits  I-STAT TROPONIN, ED    EKG EKG Interpretation  Date/Time:  Monday August 05 2017 11:58:29 EDT Ventricular Rate:  110 PR Interval:  196 QRS Duration: 88 QT Interval:  324 QTC Calculation: 438 R Axis:   -37 Text Interpretation:  Sinus tachycardia Left axis deviation Septal infarct , age undetermined Abnormal ECG Confirmed by Davonna Belling [phone]) on 08/05/2017 6:43:30 PM   Radiology Dg Chest 2 View  Result Date: 08/05/2017 CLINICAL DATA:  Left shoulder and arm pain for the past week associated with this cough. History of CVA in December 2018 EXAM: CHEST - 2 VIEW COMPARISON:  None in PACs FINDINGS: The lungs are adequately inflated and clear. The heart and pulmonary vascularity are normal. The mediastinum is normal in width. There is no pleural effusion. The trachea is midline. The bony thorax is unremarkable. IMPRESSION: There is no active cardiopulmonary disease. Electronically Signed   By:  David  Martinique M.D.   On: 08/05/2017 13:38   Dg Shoulder Left  Result Date: 08/05/2017 CLINICAL DATA:  Left shoulder pain.  No known injury. EXAM: LEFT SHOULDER - 2+ VIEW COMPARISON:  None. FINDINGS: There is no evidence of fracture or dislocation. There is no evidence of arthropathy or other focal bone abnormality. Soft tissues are unremarkable. IMPRESSION: Negative. Electronically Signed   By: Rolm Baptise M.D.   On: 08/05/2017 19:47    Procedures Procedures (including critical care time)  Medications Ordered in ED Medications  HYDROcodone-acetaminophen (NORCO/VICODIN) 5-325 MG per tablet 1 tablet (1 tablet Oral Given 08/05/17 1840)  ibuprofen (ADVIL,MOTRIN) tablet 600 mg (600 mg Oral Given 08/05/17 1840)     Initial Impression / Assessment and Plan / ED Course  I have reviewed the triage vital signs and the nursing notes.  Pertinent labs & imaging results that were available during my care of the patient were reviewed by me and considered in my medical decision making (see chart for details).     Patient presents with left shoulder pain for the last week.  I suspect patient's pain is truly coming from the shoulder rather than referred cardiac pain or from vascular pathology. Pain controlled during ED course.  PCP versus orthopedic follow-up. The patient was given instructions for home care as well as return precautions. Patient voices understanding of these instructions, accepts the plan, and is comfortable with discharge.   Findings and plan of care discussed with Davonna Belling, MD. Dr. Alvino Chapel personally evaluated and examined this patient.  Vitals:   08/05/17 1159 08/05/17 1603 08/05/17 1808 08/05/17 1810  BP: (!) 153/92 (!) 157/94 (!) 144/80   Pulse: (!) 110 93  83  Resp: '16 17  13  '$ Temp: 98 F (36.7 C) 98.3 F (36.8 C)    TempSrc: Oral     SpO2: 99% 97%  99%     Final Clinical Impressions(s) / ED Diagnoses   Final diagnoses:  Acute pain of left shoulder    ED  Discharge Orders        Ordered    HYDROcodone-acetaminophen (NORCO/VICODIN) 5-325 MG tablet  Every 4 hours PRN     08/05/17 1957    methocarbamol (ROBAXIN) 500 MG tablet  2 times daily     08/05/17 1957    lidocaine (LIDODERM) 5 %  Every 24 hours     08/05/17 2002       Layla Maw 08/05/17 Rex Kras, MD 08/05/17 2156

## 2017-08-05 NOTE — ED Triage Notes (Signed)
Pt reports left shoulder pain that started 1 week and and goes into left arm. Pt denies pain in chest or sob.

## 2017-08-05 NOTE — ED Notes (Signed)
Patient transported to X-ray 

## 2017-08-06 MED FILL — LIDOCAINE PATCH 5%: 5 | 30 days supply | Qty: 30 | Fill #0

## 2017-08-06 MED FILL — METHOCARBAMOL 500 MG TABLET: 500 | 10 days supply | Qty: 20 | Fill #0

## 2017-08-12 MED FILL — LISINOPRIL 40 MG TAB: 40 | 30 days supply | Qty: 30 | Fill #2

## 2017-08-13 ENCOUNTER — Ambulatory Visit: Payer: Self-pay | Attending: Nurse Practitioner | Admitting: Nurse Practitioner

## 2017-08-13 ENCOUNTER — Encounter: Payer: Self-pay | Admitting: Nurse Practitioner

## 2017-08-13 VITALS — BP 137/85 | HR 94 | Temp 98.6°F | Ht 70.0 in | Wt 198.4 lb

## 2017-08-13 DIAGNOSIS — Z79899 Other long term (current) drug therapy: Secondary | ICD-10-CM | POA: Insufficient documentation

## 2017-08-13 DIAGNOSIS — M25512 Pain in left shoulder: Secondary | ICD-10-CM | POA: Insufficient documentation

## 2017-08-13 DIAGNOSIS — Z8673 Personal history of transient ischemic attack (TIA), and cerebral infarction without residual deficits: Secondary | ICD-10-CM | POA: Insufficient documentation

## 2017-08-13 DIAGNOSIS — I10 Essential (primary) hypertension: Secondary | ICD-10-CM | POA: Insufficient documentation

## 2017-08-13 DIAGNOSIS — Z8719 Personal history of other diseases of the digestive system: Secondary | ICD-10-CM | POA: Insufficient documentation

## 2017-08-13 DIAGNOSIS — Z833 Family history of diabetes mellitus: Secondary | ICD-10-CM | POA: Insufficient documentation

## 2017-08-13 DIAGNOSIS — E1165 Type 2 diabetes mellitus with hyperglycemia: Secondary | ICD-10-CM | POA: Insufficient documentation

## 2017-08-13 DIAGNOSIS — Z7902 Long term (current) use of antithrombotics/antiplatelets: Secondary | ICD-10-CM | POA: Insufficient documentation

## 2017-08-13 DIAGNOSIS — Z8249 Family history of ischemic heart disease and other diseases of the circulatory system: Secondary | ICD-10-CM | POA: Insufficient documentation

## 2017-08-13 DIAGNOSIS — Z823 Family history of stroke: Secondary | ICD-10-CM | POA: Insufficient documentation

## 2017-08-13 DIAGNOSIS — Z9889 Other specified postprocedural states: Secondary | ICD-10-CM | POA: Insufficient documentation

## 2017-08-13 DIAGNOSIS — Z7984 Long term (current) use of oral hypoglycemic drugs: Secondary | ICD-10-CM | POA: Insufficient documentation

## 2017-08-13 DIAGNOSIS — I63332 Cerebral infarction due to thrombosis of left posterior cerebral artery: Secondary | ICD-10-CM

## 2017-08-13 DIAGNOSIS — Z86718 Personal history of other venous thrombosis and embolism: Secondary | ICD-10-CM | POA: Insufficient documentation

## 2017-08-13 DIAGNOSIS — Z79891 Long term (current) use of opiate analgesic: Secondary | ICD-10-CM | POA: Insufficient documentation

## 2017-08-13 DIAGNOSIS — E785 Hyperlipidemia, unspecified: Secondary | ICD-10-CM | POA: Insufficient documentation

## 2017-08-13 DIAGNOSIS — E119 Type 2 diabetes mellitus without complications: Secondary | ICD-10-CM

## 2017-08-13 LAB — GLUCOSE, POCT (MANUAL RESULT ENTRY): POC Glucose: 236 mg/dl — AB (ref 70–99)

## 2017-08-13 LAB — POCT GLYCOSYLATED HEMOGLOBIN (HGB A1C): Hemoglobin A1C: 10

## 2017-08-13 MED ORDER — TRUE METRIX METER W/DEVICE KIT: PACK | 0 refills | Status: DC

## 2017-08-13 MED ORDER — DICLOFENAC SODIUM 1 % TD GEL: 4.0000 g | Freq: Four times a day (QID) | TRANSDERMAL | 1 refills | Status: DC

## 2017-08-13 MED ORDER — TRUEPLUS LANCETS 28G MISC: 3 refills | Status: DC

## 2017-08-13 MED ORDER — GLIMEPIRIDE 4 MG PO TABS: 8.0000 mg | ORAL_TABLET | Freq: Every day | ORAL | 1 refills | Status: DC

## 2017-08-13 MED ORDER — AMLODIPINE BESYLATE 10 MG PO TABS: 10.0000 mg | ORAL_TABLET | Freq: Every day | ORAL | 1 refills | Status: DC

## 2017-08-13 MED ORDER — LISINOPRIL 40 MG PO TABS: 40.0000 mg | ORAL_TABLET | Freq: Every day | ORAL | 1 refills | Status: DC

## 2017-08-13 MED ORDER — ATORVASTATIN CALCIUM 40 MG PO TABS: 40.0000 mg | ORAL_TABLET | Freq: Every day | ORAL | 1 refills | Status: DC

## 2017-08-13 MED ORDER — ONETOUCH DELICA LANCETS 33G MISC: 2 refills | Status: DC

## 2017-08-13 MED ORDER — CLOPIDOGREL BISULFATE 75 MG PO TABS: 75.0000 mg | ORAL_TABLET | Freq: Every day | ORAL | 1 refills | Status: DC

## 2017-08-13 MED ORDER — METHOCARBAMOL 500 MG PO TABS: 500.0000 mg | ORAL_TABLET | Freq: Two times a day (BID) | ORAL | 0 refills | Status: DC

## 2017-08-13 MED ORDER — SITAGLIPTIN PHOS-METFORMIN HCL 50-1000 MG PO TABS: 1.0000 | ORAL_TABLET | Freq: Two times a day (BID) | ORAL | 1 refills | Status: DC

## 2017-08-13 MED ORDER — GLUCOSE BLOOD VI STRP: ORAL_STRIP | 12 refills | Status: DC

## 2017-08-13 MED FILL — DICLOFENAC SODIUM 1% GEL: 1 | 12 days supply | Qty: 100 | Fill #0

## 2017-08-13 MED FILL — !TRUE METRIX BLOOD GLUCOSE: 1 days supply | Qty: 1 | Fill #0

## 2017-08-13 MED FILL — JANUMET 50-1,000 MG TABLET: 50-1000 | 30 days supply | Qty: 60 | Fill #0

## 2017-08-13 MED FILL — TRUEplus LANCETS 28G MISC: 50 days supply | Qty: 100 | Fill #0

## 2017-08-13 MED FILL — GLIMEPIRIDE 4 MG TABLET: 4 | 30 days supply | Qty: 60 | Fill #0

## 2017-08-13 MED FILL — METHOCARBAMOL 500 MG TABLET: 500 | 10 days supply | Qty: 20 | Fill #0

## 2017-08-13 MED FILL — TRUE METRIX TEST STRIP: 30 days supply | Qty: 100 | Fill #0

## 2017-08-13 MED FILL — CLOPIDOGREL 75 MG TABLET: 75 | 30 days supply | Qty: 30 | Fill #0

## 2017-08-13 NOTE — Patient Instructions (Addendum)
Diabetes blood sugar goals  Fasting in AM before breakfast which means at least 8 hrs of no eating or drinking) except water or unsweetened coffee or tea): 90-110 2 hrs after meals: < 160,   Hypoglycemia or low blood sugar: < 70 (You should not have hypoglycemia.)  Aim for 30 minutes of exercise most days. Rethink what you drink. Water is great! Aim for 2-3 Carb Choices per meal (30-45 grams) +/- 1 either way  Aim for 0-15 Carbs per snack if hungry  Include protein in moderation with your meals and snacks  Consider reading food labels for Total Carbohydrate and Fat Grams of foods  Consider checking BG at alternate times per day  Continue taking medication as directed Be mindful about how much sugar you are adding to beverages and other foods. Fruit Punch - find one with no sugar  Measure and decrease portions of carbohydrate foods  Make your plate and don't go back for seconds   DASH Eating Plan DASH stands for "Dietary Approaches to Stop Hypertension." The DASH eating plan is a healthy eating plan that has been shown to reduce high blood pressure (hypertension). It may also reduce your risk for type 2 diabetes, heart disease, and stroke. The DASH eating plan may also help with weight loss. What are tips for following this plan? General guidelines  Avoid eating more than 2,300 mg (milligrams) of salt (sodium) a day. If you have hypertension, you may need to reduce your sodium intake to 1,500 mg a day.  Limit alcohol intake to no more than 1 drink a day for nonpregnant women and 2 drinks a day for men. One drink equals 12 oz of beer, 5 oz of wine, or 1 oz of hard liquor.  Work with your health care provider to maintain a healthy body weight or to lose weight. Ask what an ideal weight is for you.  Get at least 30 minutes of exercise that causes your heart to beat faster (aerobic exercise) most days of the week. Activities may include walking, swimming, or biking.  Work with  your health care provider or diet and nutrition specialist (dietitian) to adjust your eating plan to your individual calorie needs. Reading food labels  Check food labels for the amount of sodium per serving. Choose foods with less than 5 percent of the Daily Value of sodium. Generally, foods with less than 300 mg of sodium per serving fit into this eating plan.  To find whole grains, look for the word "whole" as the first word in the ingredient list. Shopping  Buy products labeled as "low-sodium" or "no salt added."  Buy fresh foods. Avoid canned foods and premade or frozen meals. Cooking  Avoid adding salt when cooking. Use salt-free seasonings or herbs instead of table salt or sea salt. Check with your health care provider or pharmacist before using salt substitutes.  Do not fry foods. Cook foods using healthy methods such as baking, boiling, grilling, and broiling instead.  Cook with heart-healthy oils, such as olive, canola, soybean, or sunflower oil. Meal planning   Eat a balanced diet that includes: ? 5 or more servings of fruits and vegetables each day. At each meal, try to fill half of your plate with fruits and vegetables. ? Up to 6-8 servings of whole grains each day. ? Less than 6 oz of lean meat, poultry, or fish each day. A 3-oz serving of meat is about the same size as a deck of cards. One egg equals 1  oz. ? 2 servings of low-fat dairy each day. ? A serving of nuts, seeds, or beans 5 times each week. ? Heart-healthy fats. Healthy fats called Omega-3 fatty acids are found in foods such as flaxseeds and coldwater fish, like sardines, salmon, and mackerel.  Limit how much you eat of the following: ? Canned or prepackaged foods. ? Food that is high in trans fat, such as fried foods. ? Food that is high in saturated fat, such as fatty meat. ? Sweets, desserts, sugary drinks, and other foods with added sugar. ? Full-fat dairy products.  Do not salt foods before  eating.  Try to eat at least 2 vegetarian meals each week.  Eat more home-cooked food and less restaurant, buffet, and fast food.  When eating at a restaurant, ask that your food be prepared with less salt or no salt, if possible. What foods are recommended? The items listed may not be a complete list. Talk with your dietitian about what dietary choices are best for you. Grains Whole-grain or whole-wheat bread. Whole-grain or whole-wheat pasta. Brown rice. [place]. Bulgur. Whole-grain and low-sodium cereals. Pita bread. Low-fat, low-sodium crackers. Whole-wheat flour tortillas. Vegetables Fresh or frozen vegetables (raw, steamed, roasted, or grilled). Low-sodium or reduced-sodium tomato and vegetable juice. Low-sodium or reduced-sodium tomato sauce and tomato paste. Low-sodium or reduced-sodium canned vegetables. Fruits All fresh, dried, or frozen fruit. Canned fruit in natural juice (without added sugar). Meat and other protein foods Skinless chicken or [place]. Ground chicken or [place]. Pork with fat trimmed off. Fish and seafood. Egg whites. Dried beans, peas, or lentils. Unsalted nuts, nut butters, and seeds. Unsalted canned beans. Lean cuts of beef with fat trimmed off. Low-sodium, lean deli meat. Dairy Low-fat (1%) or fat-free (skim) milk. Fat-free, low-fat, or reduced-fat cheeses. Nonfat, low-sodium ricotta or cottage cheese. Low-fat or nonfat yogurt. Low-fat, low-sodium cheese. Fats and oils Soft margarine without trans fats. Vegetable oil. Low-fat, reduced-fat, or light mayonnaise and salad dressings (reduced-sodium). Canola, safflower, olive, soybean, and sunflower oils. Avocado. Seasoning and other foods Herbs. Spices. Seasoning mixes without salt. Unsalted popcorn and pretzels. Fat-free sweets. What foods are not recommended? The items listed may not be a complete list. Talk with your dietitian about what dietary choices are best for you. Grains Baked goods made with fat,  such as croissants, muffins, or some breads. Dry pasta or rice meal packs. Vegetables Creamed or fried vegetables. Vegetables in a cheese sauce. Regular canned vegetables (not low-sodium or reduced-sodium). Regular canned tomato sauce and paste (not low-sodium or reduced-sodium). Regular tomato and vegetable juice (not low-sodium or reduced-sodium). Angie Fava. Olives. Fruits Canned fruit in a light or heavy syrup. Fried fruit. Fruit in cream or butter sauce. Meat and other protein foods Fatty cuts of meat. Ribs. Fried meat. Berniece Salines. Sausage. Bologna and other processed lunch meats. Salami. Fatback. Hotdogs. Bratwurst. Salted nuts and seeds. Canned beans with added salt. Canned or smoked fish. Whole eggs or egg yolks. Chicken or [place] with skin. Dairy Whole or 2% milk, cream, and half-and-half. Whole or full-fat cream cheese. Whole-fat or sweetened yogurt. Full-fat cheese. Nondairy creamers. Whipped toppings. Processed cheese and cheese spreads. Fats and oils Butter. Stick margarine. Lard. Shortening. Ghee. Bacon fat. Tropical oils, such as coconut, palm kernel, or palm oil. Seasoning and other foods Salted popcorn and pretzels. Onion salt, garlic salt, seasoned salt, table salt, and sea salt. Worcestershire sauce. Tartar sauce. Barbecue sauce. Teriyaki sauce. Soy sauce, including reduced-sodium. Steak sauce. Canned and packaged gravies. Fish sauce. Oyster sauce.  Cocktail sauce. Horseradish that you find on the shelf. Ketchup. Mustard. Meat flavorings and tenderizers. Bouillon cubes. Hot sauce and Tabasco sauce. Premade or packaged marinades. Premade or packaged taco seasonings. Relishes. Regular salad dressings. Where to find more information:  National Heart, Lung, and [place]: https://wilson-eaton.com/  American Heart Association: www.heart.org Summary  The DASH eating plan is a healthy eating plan that has been shown to reduce high blood pressure (hypertension). It may also reduce your risk for  type 2 diabetes, heart disease, and stroke.  With the DASH eating plan, you should limit salt (sodium) intake to 2,300 mg a day. If you have hypertension, you may need to reduce your sodium intake to 1,500 mg a day.  When on the DASH eating plan, aim to eat more fresh fruits and vegetables, whole grains, lean proteins, low-fat dairy, and heart-healthy fats.  Work with your health care provider or diet and nutrition specialist (dietitian) to adjust your eating plan to your individual calorie needs. This information is not intended to replace advice given to you by your health care provider. Make sure you discuss any questions you have with your health care provider. Document Released: 04/19/2011 Document Revised: 04/23/2016 Document Reviewed: 04/23/2016 Elsevier Interactive Patient Education  2018 [employer].  Dyslipidemia Dyslipidemia is an imbalance of waxy, fat-like substances (lipids) in the blood. The body needs lipids in small amounts. Dyslipidemia often involves a high level of cholesterol or triglycerides, which are types of lipids. Common forms of dyslipidemia include:  High levels of bad cholesterol (LDL cholesterol). LDL is the type of cholesterol that causes fatty deposits (plaques) to build up in the blood vessels that carry blood away from your heart (arteries).  Low levels of good cholesterol (HDL cholesterol). HDL cholesterol is the type of cholesterol that protects against heart disease. High levels of HDL remove the LDL buildup from arteries.  High levels of triglycerides. Triglycerides are a fatty substance in the blood that is linked to a buildup of plaques in the arteries.  You can develop dyslipidemia because of the genes you are born with (primary dyslipidemia) or changes that occur during your life (secondary dyslipidemia), or as a side effect of certain medical treatments. What are the causes? Primary dyslipidemia is caused by changes (mutations) in genes that are  passed down through families (inherited). These mutations cause several types of dyslipidemia. Mutations can result in disorders that make the body produce too much LDL cholesterol or triglycerides, or not enough HDL cholesterol. These disorders may lead to heart disease, arterial disease, or stroke at an early age. Causes of secondary dyslipidemia include certain lifestyle choices and diseases that lead to dyslipidemia, such as:  Eating a diet that is high in animal fat.  Not getting enough activity or exercise (having a sedentary lifestyle).  Having diabetes, kidney disease, liver disease, or thyroid disease.  Drinking large amounts of alcohol.  Using certain types of drugs.  What increases the risk? You may be at greater risk for dyslipidemia if you are an older man or if you are a woman who has gone through menopause. Other risk factors include:  Having a family history of dyslipidemia.  Taking certain medicines, including birth control pills, steroids, some diuretics, beta-blockers, and some medicines forHIV.  Smoking cigarettes.  Eating a high-fat diet.  Drinking large amounts of alcohol.  Having certain medical conditions such as diabetes, polycystic ovary syndrome (PCOS), pregnancy, kidney disease, liver disease, or hypothyroidism.  Not exercising regularly.  Being overweight or obese  with too much belly fat.  What are the signs or symptoms? Dyslipidemia does not usually cause any symptoms. Very high lipid levels can cause fatty bumps under the skin (xanthomas) or a white or gray ring around the black center (pupil) of the eye. Very high triglyceride levels can cause inflammation of the pancreas (pancreatitis). How is this diagnosed? Your health care provider may diagnose dyslipidemia based on a routine blood test (fasting blood test). Because most people do not have symptoms of the condition, this blood testing (lipid profile) is done on adults age 43 and older and is  repeated every 5 years. This test checks:  Total cholesterol. This is a measure of the total amount of cholesterol in your blood, including LDL cholesterol, HDL cholesterol, and triglycerides. A healthy number is below 200.  LDL cholesterol. The target number for LDL cholesterol is different for each person, depending on individual risk factors. For most people, a number below 100 is healthy. Ask your health care provider what your LDL cholesterol number should be.  HDL cholesterol. An HDL level of 60 or higher is best because it helps to protect against heart disease. A number below 69 for men or below 22 for women increases the risk for heart disease.  Triglycerides. A healthy triglyceride number is below 150.  If your lipid profile is abnormal, your health care provider may do other blood tests to get more information about your condition. How is this treated? Treatment depends on the type of dyslipidemia that you have and your other risk factors for heart disease and stroke. Your health care provider will have a target range for your lipid levels based on this information. For many people, treatment starts with lifestyle changes, such as diet and exercise. Your health care provider may recommend that you:  Get regular exercise.  Make changes to your diet.  Quit smoking if you smoke.  If diet changes and exercise do not help you reach your goals, your health care provider may also prescribe medicine to lower lipids. The most commonly prescribed type of medicine lowers your LDL cholesterol (statin drug). If you have a high triglyceride level, your provider may prescribe another type of drug (fibrate) or an omega-3 fish oil supplement, or both. Follow these instructions at home:  Take over-the-counter and prescription medicines only as told by your health care provider. This includes supplements.  Get regular exercise. Start an aerobic exercise and strength training program as told by your  health care provider. Ask your health care provider what activities are safe for you. Your health care provider may recommend: ? 30 minutes of aerobic activity 4-6 days a week. Brisk walking is an example of aerobic activity. ? Strength training 2 days a week.  Eat a healthy diet as told by your health care provider. This can help you reach and maintain a healthy weight, lower your LDL cholesterol, and raise your HDL cholesterol. It may help to work with a diet and nutrition specialist (dietitian) to make a plan that is right for you. Your dietitian or health care provider may recommend: ? Limiting your calories, if you are overweight. ? Eating more fruits, vegetables, whole grains, fish, and lean meats. ? Limiting saturated fat, trans fat, and cholesterol.  Follow instructions from your health care provider or dietitian about eating or drinking restrictions.  Limit alcohol intake to no more than one drink per day for nonpregnant women and two drinks per day for men. One drink equals 12 oz of  beer, 5 oz of wine, or 1 oz of hard liquor.  Do not use any products that contain nicotine or tobacco, such as cigarettes and e-cigarettes. If you need help quitting, ask your health care provider.  Keep all follow-up visits as told by your health care provider. This is important. Contact a health care provider if:  You are having trouble sticking to your exercise or diet plan.  You are struggling to quit smoking or control your use of alcohol. Summary  Dyslipidemia is an imbalance of waxy, fat-like substances (lipids) in the blood. The body needs lipids in small amounts. Dyslipidemia often involves a high level of cholesterol or triglycerides, which are types of lipids.  Treatment depends on the type of dyslipidemia that you have and your other risk factors for heart disease and stroke.  For many people, treatment starts with lifestyle changes, such as diet and exercise. Your health care provider  may also prescribe medicine to lower lipids. This information is not intended to replace advice given to you by your health care provider. Make sure you discuss any questions you have with your health care provider. Document Released: 05/05/2013 Document Revised: 12/26/2015 Document Reviewed: 12/26/2015 Elsevier Interactive Patient Education  2018 [place] Maintenance, Male A healthy lifestyle and preventive care is important for your health and wellness. Ask your health care provider about what schedule of regular examinations is right for you. What should I know about weight and diet? Eat a Healthy Diet  Eat plenty of vegetables, fruits, whole grains, low-fat dairy products, and lean protein.  Do not eat a lot of foods high in solid fats, added sugars, or salt.  Maintain a Healthy Weight Regular exercise can help you achieve or maintain a healthy weight. You should:  Do at least 150 minutes of exercise each week. The exercise should increase your heart rate and make you sweat (moderate-intensity exercise).  Do strength-training exercises at least twice a week.  Watch Your Levels of Cholesterol and Blood Lipids  Have your blood tested for lipids and cholesterol every 5 years starting at 64 years of age. If you are at high risk for heart disease, you should start having your blood tested when you are 64 years old. You may need to have your cholesterol levels checked more often if: ? Your lipid or cholesterol levels are high. ? You are older than 64 years of age. ? You are at high risk for heart disease.  What should I know about cancer screening? Many types of cancers can be detected early and may often be prevented. Lung Cancer  You should be screened every year for lung cancer if: ? You are a current smoker who has smoked for at least 30 years. ? You are a former smoker who has quit within the past 15 years.  Talk to your health care provider about your screening  options, when you should start screening, and how often you should be screened.  Colorectal Cancer  Routine colorectal cancer screening usually begins at 64 years of age and should be repeated every 5-10 years until you are 64 years old. You may need to be screened more often if early forms of precancerous polyps or small growths are found. Your health care provider may recommend screening at an earlier age if you have risk factors for colon cancer.  Your health care provider may recommend using home test kits to check for hidden blood in the stool.  A small camera at the  end of a tube can be used to examine your colon (sigmoidoscopy or colonoscopy). This checks for the earliest forms of colorectal cancer.  Prostate and Testicular Cancer  Depending on your age and overall health, your health care provider may do certain tests to screen for prostate and testicular cancer.  Talk to your health care provider about any symptoms or concerns you have about testicular or prostate cancer.  Skin Cancer  Check your skin from head to toe regularly.  Tell your health care provider about any new moles or changes in moles, especially if: ? There is a change in a mole's size, shape, or color. ? You have a mole that is larger than a pencil eraser.  Always use sunscreen. Apply sunscreen liberally and repeat throughout the day.  Protect yourself by wearing long sleeves, pants, a wide-brimmed hat, and sunglasses when outside.  What should I know about heart disease, diabetes, and high blood pressure?  If you are 27-51 years of age, have your blood pressure checked every 3-5 years. If you are 9 years of age or older, have your blood pressure checked every year. You should have your blood pressure measured twice-once when you are at a hospital or clinic, and once when you are not at a hospital or clinic. Record the average of the two measurements. To check your blood pressure when you are not at a hospital  or clinic, you can use: ? An automated blood pressure machine at a pharmacy. ? A home blood pressure monitor.  Talk to your health care provider about your target blood pressure.  If you are between 66-71 years old, ask your health care provider if you should take aspirin to prevent heart disease.  Have regular diabetes screenings by checking your fasting blood sugar level. ? If you are at a normal weight and have a low risk for diabetes, have this test once every three years after the age of 76. ? If you are overweight and have a high risk for diabetes, consider being tested at a younger age or more often.  A one-time screening for abdominal aortic aneurysm (AAA) by ultrasound is recommended for men aged 67-75 years who are current or former smokers. What should I know about preventing infection? Hepatitis B If you have a higher risk for hepatitis B, you should be screened for this virus. Talk with your health care provider to find out if you are at risk for hepatitis B infection. Hepatitis C Blood testing is recommended for:  Everyone born from [birth year] through 1967.  Anyone with known risk factors for hepatitis C.  Sexually Transmitted Diseases (STDs)  You should be screened each year for STDs including gonorrhea and chlamydia if: ? You are sexually active and are younger than 64 years of age. ? You are older than 64 years of age and your health care provider tells you that you are at risk for this type of infection. ? Your sexual activity has changed since you were last screened and you are at an increased risk for chlamydia or gonorrhea. Ask your health care provider if you are at risk.  Talk with your health care provider about whether you are at high risk of being infected with HIV. Your health care provider may recommend a prescription medicine to help prevent HIV infection.  What else can I do?  Schedule regular health, dental, and eye exams.  Stay current with your vaccines  (immunizations).  Do not use any tobacco products, such as  cigarettes, chewing tobacco, and e-cigarettes. If you need help quitting, ask your health care provider.  Limit alcohol intake to no more than 2 drinks per day. One drink equals 12 ounces of beer, 5 ounces of wine, or 1 ounces of hard liquor.  Do not use street drugs.  Do not share needles.  Ask your health care provider for help if you need support or information about quitting drugs.  Tell your health care provider if you often feel depressed.  Tell your health care provider if you have ever been abused or do not feel safe at home. This information is not intended to replace advice given to you by your health care provider. Make sure you discuss any questions you have with your health care provider. Document Released: 10/27/2007 Document Revised: 12/28/2015 Document Reviewed: 02/01/2015 Elsevier Interactive Patient Education  [employer].

## 2017-08-13 NOTE — Progress Notes (Signed)
Assessment & Plan:  Jon [name] was seen today for establish care.  Diagnoses and all orders for this visit:  Type 2 diabetes mellitus without complication, without long-term current use of insulin (HCC) -     Glucose (CBG) -     HgB A1c -     sitaGLIPtin-metformin (JANUMET) 50-1000 MG tablet; Take 1 tablet by mouth 2 (two) times daily with a meal. -     glimepiride (AMARYL) 4 MG tablet; Take 2 tablets (8 mg total) by mouth daily with breakfast. -     Blood Glucose Monitoring Suppl (TRUE METRIX METER) w/Device KIT; Use as instructed. Twice a day -     TRUEPLUS LANCETS 28G MISC; Use as instructed. Twice a day -     glucose blood (TRUE METRIX BLOOD GLUCOSE TEST) test strip; Use as instructed Diabetes is poorly controlled. Advised patient to keep a fasting blood sugar log fast, 2 hours post lunch and bedtime which will be reviewed at the next office visit.  Essential hypertension -     amLODipine (NORVASC) 10 MG tablet; Take 1 tablet (10 mg total) by mouth daily. -     lisinopril (PRINIVIL,ZESTRIL) 40 MG tablet; Take 1 tablet (40 mg total) by mouth daily. Continue all antihypertensives as prescribed.  Remember to bring in your blood pressure log with you for your follow up appointment.  DASH/Mediterranean Diets are healthier choices for HTN.   Cerebrovascular accident (CVA) due to thrombosis of left posterior cerebral artery (HCC) -     clopidogrel (PLAVIX) 75 MG tablet; Take 1 tablet (75 mg total) by mouth daily.  Dyslipidemia -     atorvastatin (LIPITOR) 40 MG tablet; Take 1 tablet (40 mg total) by mouth daily. Work on a low fat, heart healthy diet and participate in regular aerobic exercise program to control as well by working out at least 150 minutes per week. No fried foods. No junk foods, sodas, sugary drinks, unhealthy snacking, or smoking.   Acute pain of left shoulder -     diclofenac sodium (VOLTAREN) 1 % GEL; Apply 4 g topically 4 (four) times daily. -     methocarbamol  (ROBAXIN) 500 MG tablet; Take 1 tablet (500 mg total) by mouth 2 (two) times daily.   Patient has been counseled on age-appropriate routine health concerns for screening and prevention. These are reviewed and up-to-date. Referrals have been placed accordingly. Immunizations are up-to-date or declined.    Subjective:   Chief Complaint  Patient presents with  . Establish Care    Pt is here to establish care for diabetes.    [name] 62 y.o. male presents to office today to establish care. He has a history of HTN, HPL, DM and recent (04-2017) left sided stroke.    Essential Hypertension  Blood pressure controlled. Taking Norvasc 27m and lisinopril 433mdaily. Denies chest pain, shortness of breath, palpitations, lightheadedness, dizziness, headaches or BLE edema.  BP Readings from Last 3 Encounters:  08/13/17 137/85  08/05/17 (!) 150/84  05/15/17 128/85    Hyperlipidemia Controlled. Endorses medication compliance. Denies statin intolerance or myalgias. Taking atorvastatin 4060ms prescribed.  Lab Results  Component Value Date   LDLCALC 83 04/27/2017    Stroke Taking plavix as prescribed. Next appointment with neurology 08-26-2017. He canceled his last appointment with Neurology last month. Currently denies any residual deficits.   Diabetes Mellitus Type 2 Checking his blood sugars 3 times a week. A1c continues to be elevated and poorly controlled. He endorses  diet and exercise compliance. Denies hypo of hyperglycemic symptoms. He was instructed to start monitoring his blood sugars BID (fasting; postprandial). Needs to bring meter in to next appointment. Metformin switched to Janumet. Continue on glipizide. May need insulin in continues with high readings.  Lab Results  Component Value Date   HGBA1C 10.0 08/13/2017    Left Shoulder Pain Seen in the ED on 08-05-2017 for acute pain of left shoulder. Pain is mostly in lower scapular area and radiates down left arm. He  denies any numbness, weakness,  injury or trauma to the left arm or shoulder. Aggravating factors: full extension of arm. Relieving factors: Pressure/massage. He denies any cervical neck pain.    Review of Systems  Constitutional: Negative for fever, malaise/fatigue and weight loss.  HENT: Negative.  Negative for nosebleeds.   Eyes: Negative.  Negative for blurred vision, double vision and photophobia.  Respiratory: Negative.  Negative for cough and shortness of breath.   Cardiovascular: Negative.  Negative for chest pain, palpitations and leg swelling.  Gastrointestinal: Negative.  Negative for heartburn, nausea and vomiting.  Musculoskeletal: Negative for myalgias.       SEE HPI  Neurological: Negative.  Negative for dizziness, tingling, sensory change, speech change, focal weakness, seizures, weakness and headaches.  Psychiatric/Behavioral: Negative.  Negative for suicidal ideas.    Past Medical History:  Diagnosis Date  . Diabetes mellitus without complication ([place])    type II  . History of kidney stones   . Hyperlipidemia   . Hypertension   . Kidney stones   . Stomach ulcer     Past Surgical History:  Procedure Laterality Date  . CYSTOSCOPY Left 12/10/2016   Procedure: cystoscopy with left ureteral stone extraction ;  Surgeon: Kathie Rhodes, MD;  Location: [facility];  Service: Urology;  Laterality: Left;  . HERNIA REPAIR    . Stomach ulcer      Family History  Problem Relation Age of Onset  . Diabetes Father   . Stroke Father   . Hypertension Father   . Hypertension Mother   . Diabetes Sister   . Diabetes Son   . Diabetes Daughter     Social History Reviewed with no changes to be made today.   Outpatient Medications Prior to Visit  Medication Sig Dispense Refill  . amLODipine (NORVASC) 10 MG tablet Take 1 tablet (10 mg total) by mouth daily. 90 tablet 1  . atorvastatin (LIPITOR) 40 MG tablet Take 1 tablet (40 mg total) by mouth daily. 90 tablet 1  . Blood Glucose  Monitoring Suppl (ONETOUCH VERIO) w/Device KIT 1 Device by Does not apply route once. 1 kit 0  . clopidogrel (PLAVIX) 75 MG tablet Take 1 tablet (75 mg total) by mouth daily. 90 tablet 1  . glimepiride (AMARYL) 4 MG tablet Take 2 tablets (8 mg total) by mouth daily with breakfast. 90 tablet 1  . glucose blood (ONETOUCH VERIO) test strip 1 each by Other route daily. And lancets 1/day 100 each 12  . lidocaine (LIDODERM) 5 % Place 1 patch onto the skin daily. Remove & Discard patch within 12 hours or as directed by MD 30 patch 0  . lisinopril (PRINIVIL,ZESTRIL) 40 MG tablet Take 1 tablet (40 mg total) by mouth daily. 90 tablet 1  . metFORMIN (GLUCOPHAGE XR) 500 MG 24 hr tablet Take 1 tablet (500 mg total) by mouth 2 (two) times daily. 60 tablet 5  . metFORMIN (GLUCOPHAGE-XR) 500 MG 24 hr tablet TAKE 2 TABLETS BY MOUTH  TWICE DAILY WITH A MEAL 120 tablet 0  . methocarbamol (ROBAXIN) 500 MG tablet Take 1 tablet (500 mg total) by mouth 2 (two) times daily. 20 tablet 0  . ONETOUCH DELICA LANCETS 02I MISC Use to check blood sugar 1 time per day. DX Code: E11.9 100 each 2  . HYDROcodone-acetaminophen (NORCO/VICODIN) 5-325 MG tablet Take 1-2 tablets by mouth every 4 (four) hours as needed for severe pain. (Patient not taking: Reported on 08/13/2017) 10 tablet 0   No facility-administered medications prior to visit.     No Known Allergies     Objective:    BP 137/85 (BP Location: Left Arm, Patient Position: Sitting, Cuff Size: Normal)   Pulse 94   Temp 98.6 F (37 C) (Oral)   Ht _0  (1.778 m)   Wt 198 lb 6.4 oz (90 kg)   SpO2 95%   BMI 28.47 kg/m  Wt Readings from Last 3 Encounters:  08/13/17 198 lb 6.4 oz (90 kg)  05/15/17 202 lb 9.6 oz (91.9 kg)  04/26/17 192 lb 3.9 oz (87.2 kg)    Physical Exam  Constitutional: He is oriented to person, place, and time. He appears well-developed and well-nourished. He is cooperative.  HENT:  Head: Normocephalic and atraumatic.  Eyes: EOM are normal.    Neck: Normal range of motion.  Cardiovascular: Normal rate, regular rhythm and normal heart sounds. Exam reveals no gallop and no friction rub.  No murmur heard. Pulmonary/Chest: Effort normal and breath sounds normal. No tachypnea. No respiratory distress. He has no decreased breath sounds. He has no wheezes. He has no rhonchi. He has no rales. He exhibits no tenderness.  Abdominal: Soft. Bowel sounds are normal.  Musculoskeletal: Normal range of motion. He exhibits no edema.       Right shoulder: He exhibits no tenderness.       Left shoulder: He exhibits tenderness. He exhibits normal range of motion, no swelling, no pain and normal strength.       Arms: Neurological: He is alert and oriented to person, place, and time. Coordination normal.  Skin: Skin is warm and dry.  Psychiatric: He has a normal mood and affect. His behavior is normal. Judgment and thought content normal.  Nursing note and vitals reviewed.      Patient has been counseled extensively about nutrition and exercise as well as the importance of adherence with medications and regular follow-up. The patient was given clear instructions to go to ER or return to medical center if symptoms don't improve, worsen or new problems develop. The patient verbalized understanding.   Follow-up: Return in about 3 weeks (around 09/03/2017) for left shoulder pain Needs appointment with financial representative.Gildardo Pounds, FNP-BC [facility] [place], [place]   08/13/2017, 3:46 PM

## 2017-08-26 ENCOUNTER — Ambulatory Visit: Payer: Self-pay | Admitting: Neurology

## 2017-08-27 ENCOUNTER — Telehealth: Payer: Self-pay

## 2017-08-27 NOTE — Telephone Encounter (Signed)
Patient no show for appt on 08/26/2017.

## 2017-09-04 ENCOUNTER — Encounter: Payer: Self-pay | Admitting: Pharmacist

## 2017-09-04 ENCOUNTER — Ambulatory Visit: Payer: Self-pay | Attending: Nurse Practitioner | Admitting: Pharmacist

## 2017-09-04 DIAGNOSIS — E119 Type 2 diabetes mellitus without complications: Secondary | ICD-10-CM | POA: Insufficient documentation

## 2017-09-04 LAB — GLUCOSE, POCT (MANUAL RESULT ENTRY): POC GLUCOSE: 106 mg/dL — AB (ref 70–99)

## 2017-09-04 NOTE — Progress Notes (Signed)
    S:    No chief complaint on file.  Patient arrives in good spirits.  Presents for diabetes evaluation, education, and management at the request of PCP (Zelda). He was last seen by Zelda on 08/13/17.   Family/Social History:  Father - DM, stroke, HTN Sister - DM  Son - DM Daughter - DM   Never smoker  Patient reports adherence with medications.  Current diabetes medications include: glimepiride 4mg , Janumet 50-1000 mg    Patient reports 2 hypoglycemic reading. His meter revealed 2 readings <70. Of note, this appears to have occurred the first couple of days after starting glimepiride. These were the only occurrences since 08/13/17. Pt reports that once he understood to eat with glimepiride he didn't experience hypoglycemia. His meter confirms this with no objective measurements below 70 since those initial 2 readings.  Patient reported dietary habits: reports eating 2 meals/day on average Breakfast: [employer] - breakfast burrito Lunch: sometimes skipped d/t work schedule. Dinner: biggest meal of the day - specific types of food varies  Patient reported exercise habits: pt reports getting physical activity by means of walking after work with fiance. He reports that he sometimes walks 5 miles.   Patient denies nocturia.  Patient denies neuropathy. Patient denies recent visual changes. Patient reports self foot exams.    O:  Physical Exam   ROS   Lab Results  Component Value Date   HGBA1C 10.0 08/13/2017   There were no vitals filed for this visit.  Home glucose readings: Home fasting CBG: ranges 58 - 150 2 hour post-prandial/random CBG: 91 - 194  POCT: 106 Down from 236 obtained in clinic on 08/13/17. Of note, patient reports not eating since breakfast (~8AM).   A/P:  Diabetes longstanding diagnosed currently uncontrolled but closer to goal with most home levels and today's POCT level at goal. He reports compliance checking home sugars and brought his meter with him  to appointment. Was able to verify readings per his meter.  Most fasting and post-prandial levels are within goal range. His highest fasting level is 150 and highest post-prandial level is 194 since starting Janumet and glimepiride on 08/13/17. These highs are only occasional.   Pt was followed by Dr. Loanne Drilling of Endocrinology in the past but lost health insurance and was unable to continue care.  Additionally, he has tried numerous antidiabetic medications but cost has been a limiting factor in therapy. Patient reports that he has completed his patient assistance application for Janumet with our pharmacy.   The purpose of this visit was mainly for medication and DM education. Encouraged pt to continue taking medications as prescribed. Additionally, glucometer technique reinforced and demonstrated by patient. Significance of continuing to monitor fasting and post-prandial levels reinforced. Patient was amenable to checking glucose levels at home and to continuing medications. The importance of diet and physical activity was covered extensively.  Patient did have 2 hypoglycemic readings since last being seen but none since those initial 2. He is able to verbalize appropriate hypoglycemia management plan.   Next A1C anticipated 7/19.    Written patient instructions provided.  Total time in face to face counseling 25 minutes.   Follow-up in clinic with PCP.   Patient seen with Suzzanne Cloud, [facility] PharmD [employer], PharmD [facility] [phone]

## 2017-09-04 NOTE — Patient Instructions (Signed)
Thank you for coming to see me today. Continue making those lifestyle changes we've discussed. Additionally, please continue taking medications and check blood sugars as prescribed. Please follow-up with Zelda.

## 2017-09-06 MED FILL — AMLODIPINE BESYLATE 10 MG T: 10 | 30 days supply | Qty: 30 | Fill #3

## 2017-09-06 MED FILL — ATORVASTATIN 40 MG TABLET: 40 | 30 days supply | Qty: 30 | Fill #2

## 2017-09-09 ENCOUNTER — Ambulatory Visit: Payer: Self-pay | Admitting: Internal Medicine

## 2017-09-12 MED FILL — GLIMEPIRIDE 4 MG TABS: 4 | 30 days supply | Qty: 60 | Fill #1

## 2017-09-12 MED FILL — LISINOPRIL 40 MG TAB: 40 | 30 days supply | Qty: 30 | Fill #3

## 2017-09-24 MED FILL — !JANUMET 50-1000MG TABLET: 50-1000 | 30 days supply | Qty: 60 | Fill #1

## 2017-09-24 MED FILL — CLOPIDOGREL 75 MG TABLET: 75 | 30 days supply | Qty: 30 | Fill #1

## 2017-10-09 MED FILL — TRUEplus LANCETS 28G MISC: 50 days supply | Qty: 100 | Fill #1

## 2017-10-09 MED FILL — ATORVASTATIN CALCIUM 40 MG: 40 | 30 days supply | Qty: 30 | Fill #3

## 2017-10-09 MED FILL — GLIMEPIRIDE 4 MG TABS: 4 | 30 days supply | Qty: 60 | Fill #2

## 2017-10-09 MED FILL — LISINOPRIL 40 MG TABLET: 40 | 30 days supply | Qty: 30 | Fill #4

## 2017-10-09 MED FILL — AMLODIPINE BESYLATE 10 MG T: 10 | 30 days supply | Qty: 30 | Fill #4

## 2017-10-09 MED FILL — TRUE METRIX TEST STRIP: 30 days supply | Qty: 100 | Fill #1

## 2017-10-28 MED FILL — CLOPIDOGREL 75 MG TABLET: 75 | 30 days supply | Qty: 30 | Fill #2

## 2017-11-04 ENCOUNTER — Other Ambulatory Visit: Payer: Self-pay | Admitting: Nurse Practitioner

## 2017-11-04 DIAGNOSIS — E119 Type 2 diabetes mellitus without complications: Secondary | ICD-10-CM

## 2017-11-04 MED FILL — LISINOPRIL 40 MG TABLET: 40 | 30 days supply | Qty: 30 | Fill #5

## 2017-11-05 MED FILL — GLIMEPIRIDE 4 MG TABS: 4 | 30 days supply | Qty: 60 | Fill #0

## 2017-11-05 MED FILL — !JANUMET 50-1000MG TABLET: 50-1000 | 30 days supply | Qty: 60 | Fill #0

## 2017-11-05 NOTE — Telephone Encounter (Signed)
Patient seen on 09/04/17 by CPP. Patient should continue glimepiride and janumet to continue control of his sugar levels.

## 2017-11-15 ENCOUNTER — Ambulatory Visit: Payer: Self-pay | Admitting: Nurse Practitioner

## 2017-11-25 MED FILL — CLOPIDOGREL 75 MG TABLET: 75 | 30 days supply | Qty: 30 | Fill #3

## 2017-12-11 MED FILL — GLIMEPIRIDE 4 MG TABS: 4 | 30 days supply | Qty: 60 | Fill #1

## 2017-12-11 MED FILL — ATORVASTATIN CALCIUM 40 MG: 40 | 30 days supply | Qty: 30 | Fill #4

## 2017-12-11 MED FILL — !JANUMET 50-1000MG TABLET: 50-1000 | 30 days supply | Qty: 60 | Fill #1

## 2017-12-11 MED FILL — AMLODIPINE BESYLATE 10 MG T: 10 | 30 days supply | Qty: 30 | Fill #5

## 2017-12-11 MED FILL — LISINOPRIL 40 MG TABLET: 40 | 30 days supply | Qty: 30 | Fill #0

## 2017-12-16 ENCOUNTER — Encounter: Payer: Self-pay | Admitting: Nurse Practitioner

## 2017-12-16 ENCOUNTER — Ambulatory Visit: Payer: Self-pay | Attending: Nurse Practitioner | Admitting: Nurse Practitioner

## 2017-12-16 VITALS — BP 130/85 | HR 108 | Temp 98.5°F | Ht 70.0 in | Wt 200.6 lb

## 2017-12-16 DIAGNOSIS — I63332 Cerebral infarction due to thrombosis of left posterior cerebral artery: Secondary | ICD-10-CM | POA: Insufficient documentation

## 2017-12-16 DIAGNOSIS — Z79899 Other long term (current) drug therapy: Secondary | ICD-10-CM | POA: Insufficient documentation

## 2017-12-16 DIAGNOSIS — Z7902 Long term (current) use of antithrombotics/antiplatelets: Secondary | ICD-10-CM | POA: Insufficient documentation

## 2017-12-16 DIAGNOSIS — Z7984 Long term (current) use of oral hypoglycemic drugs: Secondary | ICD-10-CM | POA: Insufficient documentation

## 2017-12-16 DIAGNOSIS — Z8719 Personal history of other diseases of the digestive system: Secondary | ICD-10-CM | POA: Insufficient documentation

## 2017-12-16 DIAGNOSIS — E119 Type 2 diabetes mellitus without complications: Secondary | ICD-10-CM | POA: Insufficient documentation

## 2017-12-16 DIAGNOSIS — E782 Mixed hyperlipidemia: Secondary | ICD-10-CM | POA: Insufficient documentation

## 2017-12-16 DIAGNOSIS — I10 Essential (primary) hypertension: Secondary | ICD-10-CM | POA: Insufficient documentation

## 2017-12-16 DIAGNOSIS — Z833 Family history of diabetes mellitus: Secondary | ICD-10-CM | POA: Insufficient documentation

## 2017-12-16 DIAGNOSIS — Z823 Family history of stroke: Secondary | ICD-10-CM | POA: Insufficient documentation

## 2017-12-16 DIAGNOSIS — Z8249 Family history of ischemic heart disease and other diseases of the circulatory system: Secondary | ICD-10-CM | POA: Insufficient documentation

## 2017-12-16 LAB — GLUCOSE, POCT (MANUAL RESULT ENTRY): POC Glucose: 105 mg/dl — AB (ref 70–99)

## 2017-12-16 LAB — POCT GLYCOSYLATED HEMOGLOBIN (HGB A1C): Hemoglobin A1C: 8.2 % — AB (ref 4.0–5.6)

## 2017-12-16 MED ORDER — SITAGLIPTIN PHOS-METFORMIN HCL 50-1000 MG PO TABS: ORAL_TABLET | ORAL | 1 refills | Status: DC

## 2017-12-16 MED ORDER — GLIMEPIRIDE 2 MG PO TABS: 8.0000 mg | ORAL_TABLET | Freq: Every day | ORAL | 1 refills | Status: DC

## 2017-12-16 MED ORDER — ATORVASTATIN CALCIUM 40 MG PO TABS
40.0000 mg | ORAL_TABLET | Freq: Every day | ORAL | 1 refills | Status: DC
Start: 2017-12-16 — End: 2018-06-10

## 2017-12-16 MED ORDER — CLOPIDOGREL BISULFATE 75 MG PO TABS: 75.0000 mg | ORAL_TABLET | Freq: Every day | ORAL | 1 refills | Status: DC

## 2017-12-16 MED ORDER — AMLODIPINE BESYLATE 10 MG PO TABS: 10.0000 mg | ORAL_TABLET | Freq: Every day | ORAL | 1 refills | Status: DC

## 2017-12-16 MED ORDER — LISINOPRIL 40 MG PO TABS: 40.0000 mg | ORAL_TABLET | Freq: Every day | ORAL | 1 refills | Status: DC

## 2017-12-16 NOTE — Patient Instructions (Signed)
Carbohydrate Counting for Diabetes Mellitus, Adult Carbohydrate counting is a method for keeping track of how many carbohydrates you eat. Eating carbohydrates naturally increases the amount of sugar (glucose) in the blood. Counting how many carbohydrates you eat helps keep your blood glucose within normal limits, which helps you manage your diabetes (diabetes mellitus). It is important to know how many carbohydrates you can safely have in each meal. This is different for every person. A diet and nutrition specialist (registered dietitian) can help you make a meal plan and calculate how many carbohydrates you should have at each meal and snack. Carbohydrates are found in the following foods:  Grains, such as breads and cereals.  Dried beans and soy products.  Starchy vegetables, such as potatoes, peas, and corn.  Fruit and fruit juices.  Milk and yogurt.  Sweets and snack foods, such as cake, cookies, candy, chips, and soft drinks.  How do I count carbohydrates? There are two ways to count carbohydrates in food. You can use either of the methods or a combination of both. Reading "Nutrition Facts" on packaged food The "Nutrition Facts" list is included on the labels of almost all packaged foods and beverages in the [place] It includes:  The serving size.  Information about nutrients in each serving, including the grams (g) of carbohydrate per serving.  To use the "Nutrition Facts":  Decide how many servings you will have.  Multiply the number of servings by the number of carbohydrates per serving.  The resulting number is the total amount of carbohydrates that you will be having.  Learning standard serving sizes of other foods When you eat foods containing carbohydrates that are not packaged or do not include "Nutrition Facts" on the label, you need to measure the servings in order to count the amount of carbohydrates:  Measure the foods that you will eat with a food scale or  measuring cup, if needed.  Decide how many standard-size servings you will eat.  Multiply the number of servings by 15. Most carbohydrate-rich foods have about 15 g of carbohydrates per serving. ? For example, if you eat 8 oz (170 g) of strawberries, you will have eaten 2 servings and 30 g of carbohydrates (2 servings x 15 g = 30 g).  For foods that have more than one food mixed, such as soups and casseroles, you must count the carbohydrates in each food that is included.  The following list contains standard serving sizes of common carbohydrate-rich foods. Each of these servings has about 15 g of carbohydrates:   hamburger bun or  English muffin.   oz (15 mL) syrup.   oz (14 g) jelly.  1 slice of bread.  1 six-inch tortilla.  3 oz (85 g) cooked rice or pasta.  4 oz (113 g) cooked dried beans.  4 oz (113 g) starchy vegetable, such as peas, corn, or potatoes.  4 oz (113 g) hot cereal.  4 oz (113 g) mashed potatoes or  of a large baked potato.  4 oz (113 g) canned or frozen fruit.  4 oz (120 mL) fruit juice.  4-6 crackers.  6 chicken nuggets.  6 oz (170 g) unsweetened dry cereal.  6 oz (170 g) plain fat-free yogurt or yogurt sweetened with artificial sweeteners.  8 oz (240 mL) milk.  8 oz (170 g) fresh fruit or one small piece of fruit.  24 oz (680 g) popped popcorn.  Example of carbohydrate counting Sample meal  3 oz (85 g) chicken breast.    6 oz (170 g) brown rice.  4 oz (113 g) corn.  8 oz (240 mL) milk.  8 oz (170 g) strawberries with sugar-free whipped topping. Carbohydrate calculation 1. Identify the foods that contain carbohydrates: ? Rice. ? Corn. ? Milk. ? Strawberries. 2. Calculate how many servings you have of each food: ? 2 servings rice. ? 1 serving corn. ? 1 serving milk. ? 1 serving strawberries. 3. Multiply each number of servings by 15 g: ? 2 servings rice x 15 g = 30 g. ? 1 serving corn x 15 g = 15 g. ? 1 serving milk x 15  g = 15 g. ? 1 serving strawberries x 15 g = 15 g. 4. Add together all of the amounts to find the total grams of carbohydrates eaten: ? 30 g + 15 g + 15 g + 15 g = 75 g of carbohydrates total. This information is not intended to replace advice given to you by your health care provider. Make sure you discuss any questions you have with your health care provider. Document Released: 04/30/2005 Document Revised: 11/18/2015 Document Reviewed: 10/12/2015 Elsevier Interactive Patient Education  2018 [employer].   How to Avoid Diabetes Mellitus Problems You can take action to prevent or slow down problems that are caused by diabetes (diabetes mellitus). Following your diabetes plan and taking care of yourself can reduce your risk of serious or life-threatening complications. Manage your diabetes  Follow instructions from your health care providers about managing your diabetes. Your diabetes may be managed by a team of health care providers who can teach you how to care for yourself and can answer questions that you have.  Educate yourself about your condition so you can make healthy choices about eating and physical activity.  Check your blood sugar (glucose) levels as often as directed. Your health care provider will help you decide how often to check your blood glucose level depending on your treatment goals and how well you are meeting them.  Ask your health care provider if you should take low-dose aspirin daily and what dose is recommended for you. Taking low-dose aspirin daily is recommended to help prevent cardiovascular disease. Do not use nicotine or tobacco Do not use any products that contain nicotine or tobacco, such as cigarettes and e-cigarettes. If you need help quitting, ask your health care provider. Nicotine raises your risk for diabetes problems. If you quit using nicotine:  You will lower your risk for heart attack, stroke, nerve disease, and kidney disease.  Your cholesterol  and blood pressure may improve.  Your blood circulation will improve.  Keep your blood pressure under control To control your blood pressure:  Follow instructions from your health care provider about meal planning, exercise, and medicines.  Make sure your health care provider checks your blood pressure at every medical visit.  A blood pressure reading consists of two numbers. Generally, the goal is to keep your top number (systolic pressure) at or below 130, and your bottom number (diastolic pressure) at or below 80. Your health care provider may recommend a lower target blood pressure. Your individualized target blood pressure is determined based on:  Your age.  Your medicines.  How long you have had diabetes.  Any other medical conditions you have.  Keep your cholesterol under control To control your cholesterol:  Follow instructions from your health care provider about meal planning, exercise, and medicines.  Have your cholesterol checked at least once a year.  You may be prescribed  medicine to lower cholesterol (statin). If you are not taking a statin, ask your health care provider if you should be.  Controlling your cholesterol may:  Help prevent heart disease and stroke. These are the most common health problems for people with diabetes.  Improve your blood flow.  Schedule and keep yearly physical exams and eye exams Your health care provider will tell you how often you need medical visits depending on your diabetes management plan. Keep all follow-up visits as directed. This is important so possible problems can be identified early and complications can be avoided or treated.  Every visit with your health care provider should include measuring your: ? Weight. ? Blood pressure. ? Blood glucose control.  Your A1c (hemoglobin A1c) level should be checked: ? At least 2 times a year, if you are meeting your treatment goals. ? 4 times a year, if you are not meeting  treatment goals or if your treatment goals have changed.  Your blood lipids (lipid profile) should be checked yearly. You should also be checked yearly for protein in your urine (urine microalbumin).  If you have type 1 diabetes, get an eye exam 3-5 years after you are diagnosed, and then once a year after your first exam.  If you have type 2 diabetes, get an eye exam as soon as you are diagnosed, and then once a year after your first exam.  Keep your vaccines current It is recommended that you receive:  A flu (influenza) vaccine every year.  A pneumonia (pneumococcal) vaccine and a hepatitis B vaccine. If you are age 51 or older, you may get the pneumonia vaccine as a series of two separate shots.  Ask your health care provider which other vaccines may be recommended. Take care of your feet Diabetes may cause you to have poor blood circulation to your legs and feet. Because of this, taking care of your feet is very important. Diabetes can cause:  The skin on the feet to get thinner, break more easily, and heal more slowly.  Nerve damage in your legs and feet, which results in decreased feeling. You may not notice minor injuries that could lead to serious problems.  To avoid foot problems:  Check your skin and feet every day for cuts, bruises, redness, blisters, or sores.  Schedule a foot exam with your health care provider once every year. This exam includes: ? Inspecting of the structure and skin of your feet. ? Checking the pulses and sensation in your feet.  Make sure that your health care provider performs a visual foot exam at every medical visit.  Take care of your teeth People with poorly controlled diabetes are more likely to have gum (periodontal) disease. Diabetes can make periodontal diseases harder to control. If not treated, periodontal diseases can lead to tooth loss. To prevent this:  Brush your teeth twice a day.  Floss at least once a day.  Visit your dentist  2 times a year.  Drink responsibly Limit alcohol intake to no more than 1 drink a day for nonpregnant women and 2 drinks a day for men. One drink equals 12 oz of beer, 5 oz of wine, or 1 oz of hard liquor. It is important to eat food when you drink alcohol to avoid low blood glucose (hypoglycemia). Avoid alcohol if you:  Have a history of alcohol abuse or dependence.  Are pregnant.  Have liver disease, pancreatitis, advanced neuropathy, or severe hypertriglyceridemia.  Lessen stress Living with diabetes can be  stressful. When you are experiencing stress, your blood glucose may be affected in two ways:  Stress hormones may cause your blood glucose to rise.  You may be distracted from taking good care of yourself.  Be aware of your stress level and make changes to help you manage challenging situations. To lower your stress levels:  Consider joining a support group.  Do planned relaxation or meditation.  Do a hobby that you enjoy.  Maintain healthy relationships.  Exercise regularly.  Work with your health care provider or a mental health professional.  Summary  You can take action to prevent or slow down problems that are caused by diabetes (diabetes mellitus). Following your diabetes plan and taking care of yourself can reduce your risk of serious or life-threatening complications.  Follow instructions from your health care providers about managing your diabetes. Your diabetes may be managed by a team of health care providers who can teach you how to care for yourself and can answer questions that you have.  Your health care provider will tell you how often you need medical visits depending on your diabetes management plan. Keep all follow-up visits as directed. This is important so possible problems can be identified early and complications can be avoided or treated. This information is not intended to replace advice given to you by your health care provider. Make sure you  discuss any questions you have with your health care provider. Document Released: 01/16/2011 Document Revised: 01/28/2016 Document Reviewed: 01/28/2016 Elsevier Interactive Patient Education  [employer].

## 2017-12-16 NOTE — Progress Notes (Signed)
Assessment & Plan:  Jon [name] was seen today for follow-up.  Diagnoses and all orders for this visit:  Type 2 diabetes mellitus without complication, without long-term current use of insulin (HCC) -     Glucose (CBG) -     HgB A1c -     Ambulatory referral to Ophthalmology -     glimepiride (AMARYL) 2 MG tablet; Take 4 tablets (8 mg total) by mouth daily with breakfast. -     sitaGLIPtin-metformin (JANUMET) 50-1000 MG tablet; TAKE 1 TABLET BY MOUTH 2 (TWO) TIMES DAILY WITH A MEAL. Continue blood sugar control as discussed in office today, low carbohydrate diet, and regular physical exercise as tolerated, 150 minutes per week (30 min each day, 5 days per week, or 50 min 3 days per week). Keep blood sugar logs with fasting goal of 90-130 mg/dl, post prandial (after you eat) less than 180.  For Hypoglycemia: BS <60 and Hyperglycemia BS >400; contact the clinic ASAP. Annual eye exams and foot exams are recommended.   Essential hypertension -     amLODipine (NORVASC) 10 MG tablet; Take 1 tablet (10 mg total) by mouth daily. -     lisinopril (PRINIVIL,ZESTRIL) 40 MG tablet; Take 1 tablet (40 mg total) by mouth daily. Continue all antihypertensives as prescribed.  Remember to bring in your blood pressure log with you for your follow up appointment.  DASH/Mediterranean Diets are healthier choices for HTN.    Mixed hyperlipidemia -     atorvastatin (LIPITOR) 40 MG tablet; Take 1 tablet (40 mg total) by mouth daily. INSTRUCTIONS: Work on a low fat, heart healthy diet and participate in regular aerobic exercise program by working out at least 150 minutes per week; 5 days a week-30 minutes per day. Avoid red meat, fried foods. junk foods, sodas, sugary drinks, unhealthy snacking, alcohol and smoking.  Drink at least 48oz of water per day and monitor your carbohydrate intake daily.    Cerebrovascular accident (CVA) due to thrombosis of left posterior cerebral artery (HCC) -     clopidogrel  (PLAVIX) 75 MG tablet; Take 1 tablet (75 mg total) by mouth daily. He still has not seen a Neurologist since his stroke in December. He denies any neurological complications or deficits. Declines neurology referral today.     Patient has been counseled on age-appropriate routine health concerns for screening and prevention. These are reviewed and up-to-date. Referrals have been placed accordingly. Immunizations are up-to-date or declined.    Subjective:   Chief Complaint  Patient presents with  . Follow-up    Pt. is here to follow-up on diabetes.    [name] 62 y.o. male presents to office today for follow up.  Hypertension He is not exercising and is adherent to low salt diet.  He does not have a blood pressure log today. However he does note that he monitors his blood pressure at home and readings are "normal".   Blood pressure is well controlled at home. Medication compliant taking amlodipine '10mg'$  and lisinopril '40mg'$   Cardiac symptoms none. Denies chest pain, shortness of breath, palpitations, lightheadedness, dizziness, headaches or BLE edema.  Cardiovascular risk factors: advanced age (older than 64 for men, 59 for women), diabetes mellitus, dyslipidemia, hypertension and male gender. Use of agents associated with hypertension: none.  History of target organ damage: none. BP Readings from Last 3 Encounters:  12/16/17 130/85  08/13/17 137/85  08/05/17 (!) 150/84    Hyperlipidemia Patient presents for follow up to hyperlipidemia.  He  is medication compliant taking lipitor 40 mg daily.  He is diet compliant and denies skin xanthelasma or statin intolerance including myalgias. LDL almost at goal. Lab Results  Component Value Date   CHOL 136 04/27/2017   Lab Results  Component Value Date   HDL 31 (L) 04/27/2017   Lab Results  Component Value Date   LDLCALC 83 04/27/2017   Lab Results  Component Value Date   TRIG 108 04/27/2017   Lab Results  Component Value  Date   CHOLHDL 4.4 04/27/2017      Diabetes Mellitus Type II Current symptoms/problems include none and have been stable.  Known diabetic complications: cardiovascular disease  He has his glucometer with him today. Readings are as follows: 7 day average 122 14 day average 127 30 day average 113 Current diabetic medications include: glimepiride '8mg'$  daily, janumet 50-1000 BID. Eye exam current (within one year): no Weight trend: stable Prior visit with dietician: no Current monitoring regimen: home blood tests - 2 times daily and office lab tests - 4 times yearly Any episodes of hypoglycemia? no Is He on ACE inhibitor or angiotensin II receptor blocker?  Yes  Lab Results  Component Value Date   HGBA1C 8.2 (A) 12/16/2017   HGBA1C 10.0 08/13/2017   HGBA1C 10.1 (H) 04/27/2017    Review of Systems  Constitutional: Negative for fever, malaise/fatigue and weight loss.  HENT: Negative.  Negative for nosebleeds.   Eyes: Negative.  Negative for blurred vision, double vision and photophobia.  Respiratory: Negative.  Negative for cough and shortness of breath.   Cardiovascular: Negative.  Negative for chest pain, palpitations and leg swelling.  Gastrointestinal: Negative.  Negative for heartburn, nausea and vomiting.  Musculoskeletal: Negative.  Negative for myalgias.  Neurological: Negative.  Negative for dizziness, focal weakness, seizures, loss of consciousness, weakness and headaches.  Psychiatric/Behavioral: Negative.  Negative for suicidal ideas.    Past Medical History:  Diagnosis Date  . Diabetes mellitus without complication ([place])    type II  . History of kidney stones   . Hyperlipidemia   . Hypertension   . Kidney stones   . Stomach ulcer     Past Surgical History:  Procedure Laterality Date  . CYSTOSCOPY Left 12/10/2016   Procedure: cystoscopy with left ureteral stone extraction ;  Surgeon: Kathie Rhodes, MD;  Location: [facility];  Service: Urology;  Laterality: Left;    . HERNIA REPAIR    . Stomach ulcer      Family History  Problem Relation Age of Onset  . Diabetes Father   . Stroke Father   . Hypertension Father   . Hypertension Mother   . Diabetes Sister   . Diabetes Son   . Diabetes Daughter     Social History Reviewed with no changes to be made today.   Outpatient Medications Prior to Visit  Medication Sig Dispense Refill  . Blood Glucose Monitoring Suppl (TRUE METRIX METER) w/Device KIT Use as instructed. Twice a day 1 kit 0  . glucose blood (TRUE METRIX BLOOD GLUCOSE TEST) test strip Use as instructed 100 each 12  . TRUEPLUS LANCETS 28G MISC Use as instructed. Twice a day 100 each 3  . amLODipine (NORVASC) 10 MG tablet Take 1 tablet (10 mg total) by mouth daily. 90 tablet 1  . atorvastatin (LIPITOR) 40 MG tablet Take 1 tablet (40 mg total) by mouth daily. 90 tablet 1  . clopidogrel (PLAVIX) 75 MG tablet Take 1 tablet (75 mg total) by mouth daily. [place]  tablet 1  . glimepiride (AMARYL) 4 MG tablet TAKE 2 TABLETS (8 MG TOTAL) BY MOUTH DAILY WITH BREAKFAST. 90 tablet 1  . JANUMET 50-1000 MG tablet TAKE 1 TABLET BY MOUTH 2 (TWO) TIMES DAILY WITH A MEAL. 60 tablet 1  . lisinopril (PRINIVIL,ZESTRIL) 40 MG tablet Take 1 tablet (40 mg total) by mouth daily. 90 tablet 1  . diclofenac sodium (VOLTAREN) 1 % GEL Apply 4 g topically 4 (four) times daily. (Patient not taking: Reported on 12/16/2017) 100 g 1  . methocarbamol (ROBAXIN) 500 MG tablet Take 1 tablet (500 mg total) by mouth 2 (two) times daily. (Patient not taking: Reported on 12/16/2017) 20 tablet 0   No facility-administered medications prior to visit.     No Known Allergies     Objective:    BP 130/85 (BP Location: Left Arm, Patient Position: Sitting, Cuff Size: Large)   Pulse (!) 108   Temp 98.5 F (36.9 C) (Oral)   Ht '5\' 10"'$  (1.778 m)   Wt 200 lb 9.6 oz (91 kg)   SpO2 94%   BMI 28.78 kg/m  Wt Readings from Last 3 Encounters:  12/16/17 200 lb 9.6 oz (91 kg)  08/13/17 198 lb 6.4  oz (90 kg)  05/15/17 202 lb 9.6 oz (91.9 kg)    Physical Exam  Constitutional: He is oriented to person, place, and time. He appears well-developed and well-nourished. He is cooperative.  HENT:  Head: Normocephalic and atraumatic.  Eyes: EOM are normal.  Neck: Normal range of motion.  Cardiovascular: Normal rate, regular rhythm and normal heart sounds. Exam reveals no gallop and no friction rub.  No murmur heard. Pulmonary/Chest: Effort normal and breath sounds normal. No tachypnea. No respiratory distress. He has no decreased breath sounds. He has no wheezes. He has no rhonchi. He has no rales. He exhibits no tenderness.  Abdominal: Soft. Bowel sounds are normal.  Musculoskeletal: Normal range of motion. He exhibits no edema.  Neurological: He is alert and oriented to person, place, and time. Coordination normal.  Skin: Skin is warm and dry.  Psychiatric: He has a normal mood and affect. His behavior is normal. Judgment and thought content normal.  Nursing note and vitals reviewed.      Patient has been counseled extensively about nutrition and exercise as well as the importance of adherence with medications and regular follow-up. The patient was given clear instructions to go to ER or return to medical center if symptoms don't improve, worsen or new problems develop. The patient verbalized understanding.   Follow-up: Return in about 4 months (around 04/04/2018) for HTN/HPL/DM.   Gildardo Pounds, FNP-BC [facility], [place]   12/16/2017, 3:25 PM

## 2017-12-22 IMAGING — CT CT ANGIO NECK
1 of 8 series · 6 of 33 positions shown · IV contrast (APPLIED)
Comparison: Prior CT and MRI from 04/26/2017.

CLINICAL DATA: Follow-up examination for acute stroke.

EXAM:
CT ANGIOGRAPHY HEAD AND NECK
TECHNIQUE: Multidetector CT imaging of the head and neck was performed using
the standard protocol during bolus administration of intravenous
contrast. Multiplanar CT image reconstructions and MIPs were
obtained to evaluate the vascular anatomy. Carotid stenosis
measurements (when applicable) are obtained utilizing NASCET
criteria, using the distal internal carotid diameter as the
denominator.
CONTRAST:  50mL 9ZWUF1-21E IOPAMIDOL (9ZWUF1-21E) INJECTION 76%

[Series 7: ax thins · axial · 0.40mm/px · z∈[-243,+1]mm · 6 of 342 slices shown]
[im 49/342  soft-tissue]
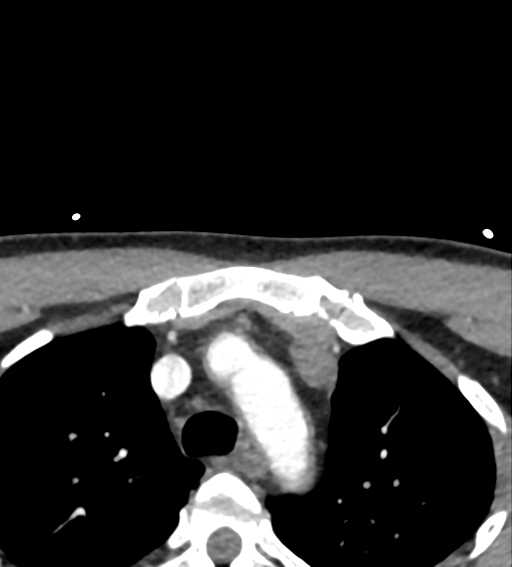
[im 98/342  bone]
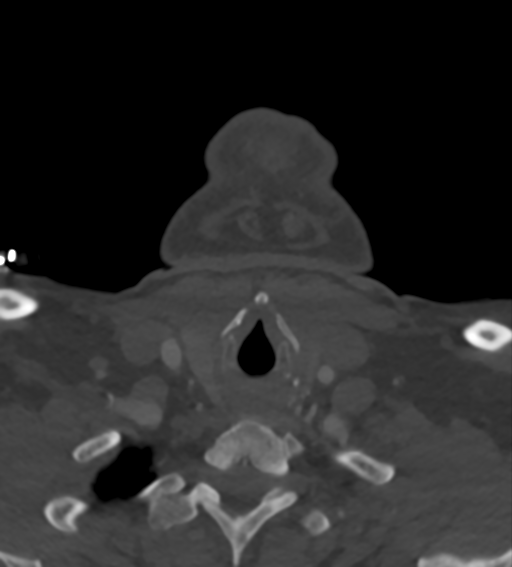
[im 147/342  soft-tissue]
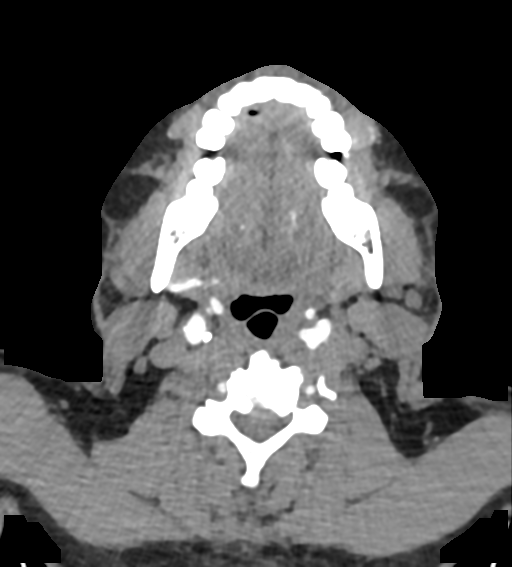
[im 195/342  bone]
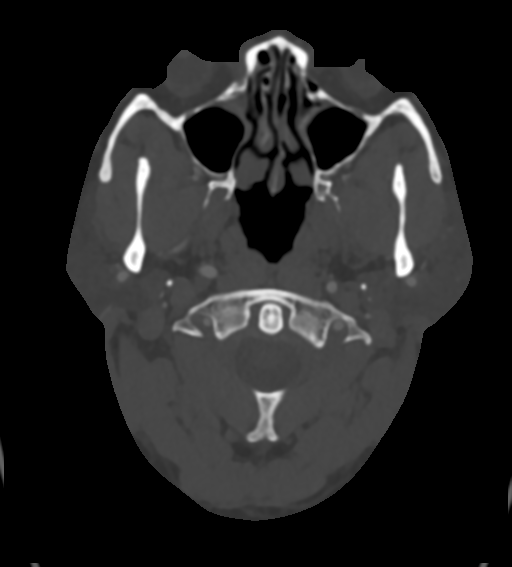
[im 244/342  soft-tissue]
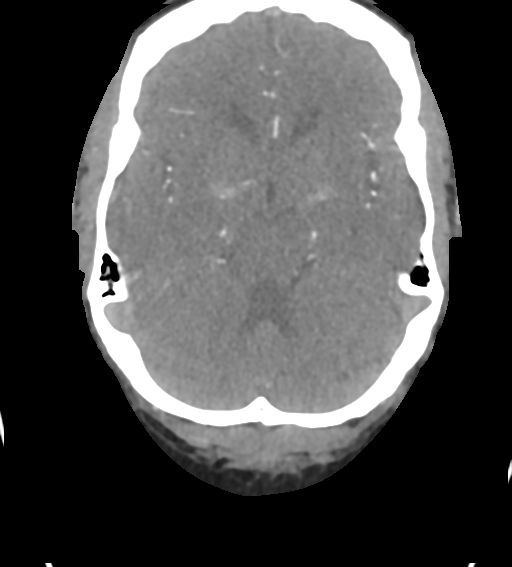
[im 293/342  bone]
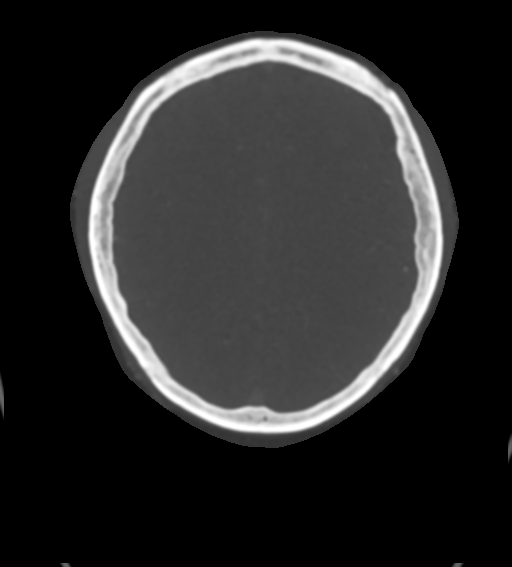

[6 of 33 positions shown; findings below may reference images not displayed]

FINDINGS: CTA NECK FINDINGS

Aortic arch: Visualized aortic arch normal in caliber or with normal
3 vessel morphology. Minimal plaque within the arch itself. No
flow-limiting stenosis about the origin of the great vessels.
Visualized subclavian artery is widely patent.

Right carotid system: Right common carotid artery widely patent to
the bifurcation. Mild mixed plaque about the right
bifurcation/proximal right ICA with relatively mild stenosis of up
to 35% by NASCET criteria. Right ICA widely patent distally to the
skullbase without stenosis, dissection, or occlusion.

Left carotid system: Left common carotid artery patent from its
origin to the bifurcation. Irregularity with superimposed small
focal outpouching at the origin of the left ICA reflects penetrating
plaque (series 7, image 197). Short-segment mild stenosis of up to
30% by NASCET criteria just distally within the proximal left ICA.
Left ICA widely patent distally to the skullbase without stenosis,
dissection, or occlusion.

Vertebral arteries: Both of the vertebral artery is arise from the
subclavian arteries. Left vertebral artery dominant. Pre foraminal
left V1 segment tortuous with associated stenosis of up to
approximately 70% (series 8, image 172). Additional multifocal mild
to moderate stenoses present within the left V2 segment, most
notable at the level of C5 (series 7, image 204). Left vertebral
artery otherwise patent to the skullbase without stenosis or
occlusion. Right vertebral artery diminutive bone widely patent to
the skullbase without hemodynamically significant stenosis.

Skeleton: No acute osseus abnormality. No worrisome lytic or blastic
osseous lesions. Moderate cervical spondylolysis, most evident at
C4-5 and C5-6.

Other neck: No acute soft tissue abnormality within the neck.
Salivary glands normal. Thyroid normal. No adenopathy.

Upper chest: Visualized upper chest within normal limits. Partially
visualized lungs are grossly clear.

Review of the MIP images confirms the above findings

CTA HEAD FINDINGS

Anterior circulation: Internal carotid artery is patent to the
termini without flow-limiting stenosis. A1 segments widely patent.
Normal anterior communicating artery. Atheromatous irregularity
within the ACAs without high-grade flow-limiting stenosis. M1
segments demonstrate mild atheromatous irregularity without
flow-limiting stenosis. Normal MCA bifurcations. No proximal M2
occlusion. Distal small vessel irregularity within the MCA branches
bilaterally.

Posterior circulation: Dominant left V4 segment patent to the
vertebrobasilar junction without flow-limiting stenosis. Right V4
segment demonstrates mild multifocal narrowing without high-grade
stenosis. Basilar artery widely patent to its distal aspect.
Superior cerebral arteries patent bilaterally. Both of the posterior
cerebral arteries primarily supplied via the basilar. Small right
posterior communicating artery noted. No flow-limiting or proximal
PCA stenosis. Distal small vessel atheromatous irregularity.

Venous sinuses: Grossly patent.

Anatomic variants: None significant. No aneurysm or vascular
abnormality.

Delayed phase: No abnormal enhancement. Small evolving acute
ischemic left internal capsule/lateral left thalamus infarct noted.

Review of the MIP images confirms the above findings
IMPRESSION: 1. Negative CTA for large vessel occlusion.
2. Mild atheromatous stenoses of approximately 30-35% by NASCET
criteria within the proximal ICAs bilaterally.
3. Short-segment severe 70% pre foraminal left V1 stenosis, with
additional mild to moderate multifocal left V2 stenoses. Left
vertebral artery is dominant. Diminutive right vertebral artery
widely patent.
4. Distal small vessel atheromatous irregularity throughout the
intracranial circulation.

## 2017-12-27 MED FILL — CLOPIDOGREL 75 MG TABLET: 75 | 30 days supply | Qty: 30 | Fill #4

## 2017-12-27 MED FILL — TRUE METRIX TEST STRIP: 30 days supply | Qty: 100 | Fill #2

## 2018-01-17 MED FILL — !JANUMET 50-1000MG TABLET: 50-1000 | 30 days supply | Qty: 60 | Fill #0

## 2018-01-17 MED FILL — GLIMEPIRIDE 2 MG TABS: 2 | 30 days supply | Qty: 120 | Fill #0

## 2018-01-17 MED FILL — CLOPIDOGREL 75 MG TABLET: 75 | 30 days supply | Qty: 30 | Fill #0

## 2018-01-23 MED FILL — ATORVASTATIN CALCIUM 40 MG: 40 | 90 days supply | Qty: 90 | Fill #0

## 2018-02-13 MED FILL — GLIMEPIRIDE 2 MG TABS: 2 | 30 days supply | Qty: 120 | Fill #1

## 2018-02-13 MED FILL — JANUMET 50-1,000 MG TABLET: 50-1000 | 30 days supply | Qty: 60 | Fill #1

## 2018-02-13 MED FILL — LISINOPRIL 40 MG TABLET: 40 | 30 days supply | Qty: 30 | Fill #1

## 2018-02-13 MED FILL — CLOPIDOGREL 75 MG TABLET: 75 | 30 days supply | Qty: 30 | Fill #1

## 2018-02-13 MED FILL — AMLODIPINE BESYLATE 10 MG T: 10 | 30 days supply | Qty: 30 | Fill #0

## 2018-03-20 MED FILL — $JANUMET 50-1000 MG TABLET: 50-1000 | 75 days supply | Qty: 150 | Fill #2

## 2018-03-26 MED FILL — CLOPIDOGREL 75 MG TABLET: 75 | 30 days supply | Qty: 30 | Fill #2

## 2018-04-08 MED FILL — CLOPIDOGREL 75 MG TABLET: 75 | 30 days supply | Qty: 30 | Fill #5

## 2018-04-08 MED FILL — AMLODIPINE BESYLATE 10 MG T: 10 | 30 days supply | Qty: 30 | Fill #1

## 2018-04-08 MED FILL — LISINOPRIL 40 MG TABLET: 40 | 30 days supply | Qty: 30 | Fill #2

## 2018-04-08 MED FILL — ATORVASTATIN CALCIUM 40 MG: 40 | 30 days supply | Qty: 30 | Fill #5

## 2018-04-08 MED FILL — GLIMEPIRIDE 2 MG TABS: 2 | 30 days supply | Qty: 120 | Fill #2

## 2018-04-18 ENCOUNTER — Encounter: Payer: Self-pay | Admitting: Nurse Practitioner

## 2018-04-18 ENCOUNTER — Ambulatory Visit: Payer: 59 | Attending: Nurse Practitioner | Admitting: Nurse Practitioner

## 2018-04-18 VITALS — BP 125/86 | HR 97 | Temp 97.2°F | Resp 16 | Wt 202.0 lb

## 2018-04-18 DIAGNOSIS — Z8249 Family history of ischemic heart disease and other diseases of the circulatory system: Secondary | ICD-10-CM | POA: Insufficient documentation

## 2018-04-18 DIAGNOSIS — Z833 Family history of diabetes mellitus: Secondary | ICD-10-CM | POA: Insufficient documentation

## 2018-04-18 DIAGNOSIS — Z823 Family history of stroke: Secondary | ICD-10-CM | POA: Insufficient documentation

## 2018-04-18 DIAGNOSIS — E119 Type 2 diabetes mellitus without complications: Secondary | ICD-10-CM | POA: Insufficient documentation

## 2018-04-18 DIAGNOSIS — E785 Hyperlipidemia, unspecified: Secondary | ICD-10-CM | POA: Insufficient documentation

## 2018-04-18 DIAGNOSIS — Z8719 Personal history of other diseases of the digestive system: Secondary | ICD-10-CM | POA: Insufficient documentation

## 2018-04-18 DIAGNOSIS — I10 Essential (primary) hypertension: Secondary | ICD-10-CM | POA: Diagnosis not present

## 2018-04-18 DIAGNOSIS — Z87442 Personal history of urinary calculi: Secondary | ICD-10-CM | POA: Insufficient documentation

## 2018-04-18 DIAGNOSIS — Z7984 Long term (current) use of oral hypoglycemic drugs: Secondary | ICD-10-CM | POA: Insufficient documentation

## 2018-04-18 DIAGNOSIS — Z79899 Other long term (current) drug therapy: Secondary | ICD-10-CM | POA: Insufficient documentation

## 2018-04-18 LAB — POCT GLYCOSYLATED HEMOGLOBIN (HGB A1C): Hemoglobin A1C: 8.3 % — AB (ref 4.0–5.6)

## 2018-04-18 LAB — POCT CBG (FASTING - GLUCOSE)-MANUAL ENTRY: Glucose Fasting, POC: 79 mg/dL (ref 70–99)

## 2018-04-18 MED ORDER — LOSARTAN POTASSIUM 50 MG PO TABS: 50.0000 mg | ORAL_TABLET | Freq: Every day | ORAL | 3 refills | Status: DC

## 2018-04-18 NOTE — Progress Notes (Signed)
Pt has concerns about lisinopril: cough too much, request new rx for HTN

## 2018-04-18 NOTE — Progress Notes (Signed)
Assessment & Plan:  Diagnoses and all orders for this visit:  Essential hypertension -     HgB A1c -     losartan (COZAAR) 50 MG tablet; Take 1 tablet (50 mg total) by mouth daily. Continue all antihypertensives as prescribed.  Remember to bring in your blood pressure log with you for your follow up appointment.  DASH/Mediterranean Diets are healthier choices for HTN.    Type 2 diabetes mellitus without complication, without long-term current use of insulin (HCC) -     Glucose (CBG), Fasting -     HgB A1c Continue blood sugar control as discussed in office today, low carbohydrate diet, and regular physical exercise as tolerated, 150 minutes per week (30 min each day, 5 days per week, or 50 min 3 days per week). Keep blood sugar logs with fasting goal of 90-130 mg/dl, post prandial (after you eat) less than 180.  For Hypoglycemia: BS <60 and Hyperglycemia BS >400; contact the clinic ASAP. Annual eye exams and foot exams are recommended.   Patient has been counseled on age-appropriate routine health concerns for screening and prevention. These are reviewed and up-to-date. Referrals have been placed accordingly. Immunizations are up-to-date or declined.    Subjective:   [name] 62 y.o. male presents to office today for follow up to HTN and DM.   Essential Hypertension Blood pressure is well controlled however he endorses persistent cough taking lisinopril. He has been taking lisinopril 40 mg for several years. I instructed him that I did not feel the lisinopril was causing his recent persistent cough as he has been on the current dosage since 2016. It is likely his cough may be related to allergies or GERD. I have dc'd his lisinopril and will start him on Losartan '50mg'$  today. He denies chest pain, shortness of breath, palpitations, lightheadedness, dizziness, headaches or BLE edema.  BP Readings from Last 3 Encounters:  04/18/18 125/86  12/16/17 130/85  08/13/17 137/85     Diabetes Mellitus Type 2 A1c has not improved. He has his meter present today. Monitoring his blood glucose levels 1-2 times per day.  Average readings:  7 day 112 14 day 122 30 day 111 He denies any hypo or hyperglycemic symptoms. Taking janumet 50-1000 mg BID and amaryl '8mg'$  daily.  He has an eye exam scheduled next Thursday.  Lab Results  Component Value Date   HGBA1C 8.3 (A) 04/18/2018    Review of Systems  Constitutional: Negative for fever, malaise/fatigue and weight loss.  HENT: Negative.  Negative for nosebleeds.   Eyes: Negative.  Negative for blurred vision, double vision and photophobia.  Respiratory: Negative.  Negative for cough and shortness of breath.   Cardiovascular: Negative.  Negative for chest pain, palpitations and leg swelling.  Gastrointestinal: Negative.  Negative for heartburn, nausea and vomiting.  Musculoskeletal: Negative.  Negative for myalgias.  Neurological: Negative.  Negative for dizziness, focal weakness, seizures and headaches.  Psychiatric/Behavioral: Negative.  Negative for suicidal ideas.    Past Medical History:  Diagnosis Date  . Diabetes mellitus without complication ([place])    type II  . History of kidney stones   . Hyperlipidemia   . Hypertension   . Kidney stones   . Stomach ulcer     Past Surgical History:  Procedure Laterality Date  . CYSTOSCOPY Left 12/10/2016   Procedure: cystoscopy with left ureteral stone extraction ;  Surgeon: Kathie Rhodes, MD;  Location: [facility];  Service: Urology;  Laterality: Left;  . HERNIA  REPAIR    . Stomach ulcer      Family History  Problem Relation Age of Onset  . Diabetes Father   . Stroke Father   . Hypertension Father   . Hypertension Mother   . Diabetes Sister   . Diabetes Son   . Diabetes Daughter     Social History Reviewed with no changes to be made today.   Outpatient Medications Prior to Visit  Medication Sig Dispense Refill  . amLODipine (NORVASC) 10 MG tablet Take 1  tablet (10 mg total) by mouth daily. 90 tablet 1  . atorvastatin (LIPITOR) 40 MG tablet Take 1 tablet (40 mg total) by mouth daily. 90 tablet 1  . clopidogrel (PLAVIX) 75 MG tablet Take 1 tablet (75 mg total) by mouth daily. 90 tablet 1  . glimepiride (AMARYL) 2 MG tablet Take 4 tablets (8 mg total) by mouth daily with breakfast. 180 tablet 1  . sitaGLIPtin-metformin (JANUMET) 50-1000 MG tablet TAKE 1 TABLET BY MOUTH 2 (TWO) TIMES DAILY WITH A MEAL. 180 tablet 1  . lisinopril (PRINIVIL,ZESTRIL) 40 MG tablet Take 1 tablet (40 mg total) by mouth daily. 90 tablet 1  . Blood Glucose Monitoring Suppl (TRUE METRIX METER) w/Device KIT Use as instructed. Twice a day 1 kit 0  . diclofenac sodium (VOLTAREN) 1 % GEL Apply 4 g topically 4 (four) times daily. (Patient not taking: Reported on 12/16/2017) 100 g 1  . glucose blood (TRUE METRIX BLOOD GLUCOSE TEST) test strip Use as instructed 100 each 12  . methocarbamol (ROBAXIN) 500 MG tablet Take 1 tablet (500 mg total) by mouth 2 (two) times daily. (Patient not taking: Reported on 12/16/2017) 20 tablet 0  . TRUEPLUS LANCETS 28G MISC Use as instructed. Twice a day 100 each 3   No facility-administered medications prior to visit.     No Known Allergies     Objective:    BP 125/86   Pulse 97   Temp (!) 97.2 F (36.2 C) (Oral)   Resp 16   Wt 202 lb (91.6 kg)   SpO2 97%   BMI 28.98 kg/m  Wt Readings from Last 3 Encounters:  04/18/18 202 lb (91.6 kg)  12/16/17 200 lb 9.6 oz (91 kg)  08/13/17 198 lb 6.4 oz (90 kg)    Physical Exam  Constitutional: He is oriented to person, place, and time. He appears well-developed and well-nourished. He is cooperative.  HENT:  Head: Normocephalic and atraumatic.  Eyes: EOM are normal.  Neck: Normal range of motion.  Cardiovascular: Normal rate, regular rhythm and normal heart sounds. Exam reveals no gallop and no friction rub.  No murmur heard. Pulmonary/Chest: Effort normal and breath sounds normal. No  tachypnea. No respiratory distress. He has no decreased breath sounds. He has no wheezes. He has no rhonchi. He has no rales. He exhibits no tenderness.  Abdominal: Bowel sounds are normal.  Musculoskeletal: Normal range of motion. He exhibits no edema.  Neurological: He is alert and oriented to person, place, and time. Coordination normal.  Skin: Skin is warm and dry.  Psychiatric: He has a normal mood and affect. His behavior is normal. Judgment and thought content normal.  Nursing note and vitals reviewed.        Patient has been counseled extensively about nutrition and exercise as well as the importance of adherence with medications and regular follow-up. The patient was given clear instructions to go to ER or return to medical center if symptoms don't improve, worsen or  new problems develop. The patient verbalized understanding.   Follow-up: No follow-ups on file.   Gildardo Pounds, FNP-BC [facility] and [facility], [place]   04/18/2018, 11:19 PM

## 2018-04-21 MED FILL — LOSARTAN POTASSIUM 50 MG TA: 50 | 30 days supply | Qty: 30 | Fill #0

## 2018-04-24 ENCOUNTER — Encounter: Payer: Self-pay | Admitting: Nurse Practitioner

## 2018-04-24 LAB — HM DIABETES EYE EXAM

## 2018-05-09 ENCOUNTER — Ambulatory Visit: Payer: 59 | Attending: Family Medicine | Admitting: Pharmacist

## 2018-05-09 ENCOUNTER — Encounter: Payer: Self-pay | Admitting: Pharmacist

## 2018-05-09 VITALS — BP 172/80 | HR 112

## 2018-05-09 DIAGNOSIS — I10 Essential (primary) hypertension: Secondary | ICD-10-CM | POA: Insufficient documentation

## 2018-05-09 DIAGNOSIS — E785 Hyperlipidemia, unspecified: Secondary | ICD-10-CM | POA: Insufficient documentation

## 2018-05-09 DIAGNOSIS — E119 Type 2 diabetes mellitus without complications: Secondary | ICD-10-CM | POA: Insufficient documentation

## 2018-05-09 DIAGNOSIS — Z8673 Personal history of transient ischemic attack (TIA), and cerebral infarction without residual deficits: Secondary | ICD-10-CM | POA: Insufficient documentation

## 2018-05-09 NOTE — Progress Notes (Signed)
   S:    PCP: Zelda  Patient arrives in good spirits. Presents to the clinic for hypertension management. Patient was referred by Zelda on 04/18/18.  Pt was changed from lisinopril to losartan d/t cough.   Today, pt reports resolution of cough. He denies chest pain, shortness of breath, headache, or blurred vision.  Patient reports adherence with medications.  Current BP Medications include:  Amlodipine 10 mg, losartan 50 mg daily  Antihypertensives tried in the past include: lisinopril (stopped d/t cough)  Dietary habits include: limits salt, drinks decaffeinated beverages daily Exercise habits include: pt reports exercising daily - amount of time not given Family / Social history: never smoker, drinks "socially"  ASCVD risk factors include: previous stroke, HTN, DM, dyslipidemia   Home BP readings: reports 130s/70s  O:  L arm after 5 minutes rest: 172/80, HR 112 Last 3 Office BP readings: BP Readings from Last 3 Encounters:  04/18/18 125/86  12/16/17 130/85  08/13/17 137/85    BMET    Component Value Date/Time   NA 135 08/05/2017 1201   K 4.0 08/05/2017 1201   CL 105 08/05/2017 1201   CO2 20 (L) 08/05/2017 1201   GLUCOSE 308 (H) 08/05/2017 1201   BUN 16 08/05/2017 1201   CREATININE 0.87 08/05/2017 1201   CALCIUM 8.9 08/05/2017 1201   GFRNONAA >60 08/05/2017 1201   GFRAA >60 08/05/2017 1201    Renal function: CrCl cannot be calculated (Patient's most recent lab result is older than the maximum 21 days allowed.).  Clinical ASCVD: Yes  The ASCVD Risk score Mikey Bussing DC Jr., et al., 2013) failed to calculate for the following reasons:   The patient has a prior MI or stroke diagnosis  A/P: Hypertension longstanding currently uncontrolled on current medications. BP Goal <130/80 mmHg. Patient is adherent with current medications.   I have trended his last several BP values in clinic and they have been close to goal. Home levels at goal. Cough has resolved with  discontinuation of lisinopril. Pt does report drinking coffee before visit ~15-20 minutes ago. Believes he may have mixed his decaff coffee with his wife's caffeinated coffee by mistake. I will make no changes at this time. He has been instructed to call me with home values consistently above goal. He knows that if his BP in 1 month is above goal, his losartan dose will need to be increased.  -Continued losartan and amlodipine.  -Counseled on lifestyle modifications for blood pressure control including reduced dietary sodium, increased exercise, adequate sleep  Results reviewed and written information provided. Total time in face-to-face counseling 15 minutes.   F/U Clinic Visit in 1 month w/ Zelda.    Benard Halsted, PharmD, [place] [phone]

## 2018-05-09 NOTE — Patient Instructions (Signed)
Thank you for coming to see [place] today.   Blood pressure today is very elevated.  Continue taking losartan and amlodipine.  Limiting salt and caffeine, as well as exercising as able for at least 30 minutes for 5 days out of the week, can also help you lower your blood pressure.  Take your blood pressure at home if you are able. Please write down these numbers and bring them to your visits.  If you have any questions about medications, please call me [phone].  Lurena Joiner

## 2018-05-15 ENCOUNTER — Other Ambulatory Visit: Payer: Self-pay | Admitting: Nurse Practitioner

## 2018-05-15 DIAGNOSIS — E119 Type 2 diabetes mellitus without complications: Secondary | ICD-10-CM

## 2018-05-15 MED FILL — ATORVASTATIN CALCIUM 40 MG: 40 | 30 days supply | Qty: 30 | Fill #1

## 2018-05-19 MED FILL — GLIMEPIRIDE 2 MG TABS: 2 | 30 days supply | Qty: 120 | Fill #0

## 2018-05-21 MED FILL — LOSARTAN POTASSIUM 50 MG TA: 50 | 30 days supply | Qty: 30 | Fill #1

## 2018-05-29 MED FILL — $JANUMET 50-1000 MG TABLET: 50-1000 | 30 days supply | Qty: 60 | Fill #3

## 2018-05-29 MED FILL — AMLODIPINE BESYLATE 10 MG T: 10 | 30 days supply | Qty: 30 | Fill #2

## 2018-06-04 MED FILL — TRUE METRIX TEST STRIP: 30 days supply | Qty: 100 | Fill #3

## 2018-06-04 MED FILL — CLOPIDOGREL 75 MG TABLET: 75 | 30 days supply | Qty: 30 | Fill #3

## 2018-06-10 ENCOUNTER — Encounter: Payer: Self-pay | Admitting: Nurse Practitioner

## 2018-06-10 ENCOUNTER — Ambulatory Visit: Payer: 59 | Attending: Nurse Practitioner | Admitting: Nurse Practitioner

## 2018-06-10 VITALS — BP 121/77 | HR 83 | Temp 98.9°F | Resp 18 | Ht 70.0 in | Wt 205.0 lb

## 2018-06-10 DIAGNOSIS — I10 Essential (primary) hypertension: Secondary | ICD-10-CM

## 2018-06-10 DIAGNOSIS — E782 Mixed hyperlipidemia: Secondary | ICD-10-CM | POA: Diagnosis not present

## 2018-06-10 DIAGNOSIS — E119 Type 2 diabetes mellitus without complications: Secondary | ICD-10-CM

## 2018-06-10 DIAGNOSIS — I63332 Cerebral infarction due to thrombosis of left posterior cerebral artery: Secondary | ICD-10-CM

## 2018-06-10 DIAGNOSIS — Z0000 Encounter for general adult medical examination without abnormal findings: Secondary | ICD-10-CM

## 2018-06-10 LAB — GLUCOSE, POCT (MANUAL RESULT ENTRY): POC Glucose: 221 mg/dl — AB (ref 70–99)

## 2018-06-10 MED ORDER — ATORVASTATIN CALCIUM 40 MG PO TABS: 40.0000 mg | ORAL_TABLET | Freq: Every day | ORAL | 1 refills | Status: DC

## 2018-06-10 MED ORDER — LOSARTAN POTASSIUM 50 MG PO TABS: 50.0000 mg | ORAL_TABLET | Freq: Every day | ORAL | 3 refills | Status: DC

## 2018-06-10 MED ORDER — AMLODIPINE BESYLATE 10 MG PO TABS
10.0000 mg | ORAL_TABLET | Freq: Every day | ORAL | 1 refills | Status: DC
Start: 2018-06-10 — End: 2018-11-03

## 2018-06-10 MED ORDER — GLIMEPIRIDE 2 MG PO TABS: 2.0000 mg | ORAL_TABLET | Freq: Every day | ORAL | 1 refills | Status: DC

## 2018-06-10 MED ORDER — SITAGLIPTIN PHOS-METFORMIN HCL 50-1000 MG PO TABS: ORAL_TABLET | ORAL | 1 refills | Status: DC

## 2018-06-10 MED ORDER — CLOPIDOGREL BISULFATE 75 MG PO TABS: 75.0000 mg | ORAL_TABLET | Freq: Every day | ORAL | 1 refills | Status: DC

## 2018-06-10 MED FILL — ATORVASTATIN CALCIUM 40 MG: 40 | 30 days supply | Qty: 30 | Fill #0

## 2018-06-10 NOTE — Progress Notes (Signed)
Assessment & Plan:  Jon Jon [name] was seen today for annual exam.  Diagnoses and all orders for this visit:  Type 2 diabetes mellitus without complication, without long-term current use of insulin (HCC) -     Glucose (CBG) -     glimepiride (AMARYL) 2 MG tablet; Take 1 tablet (2 mg total) by mouth daily with breakfast. -     sitaGLIPtin-metformin (JANUMET) 50-1000 MG tablet; TAKE 1 TABLET BY MOUTH 2 (TWO) TIMES DAILY WITH A MEAL. Continue blood sugar control as discussed in office today, low carbohydrate diet, and regular physical exercise as tolerated, 150 minutes per week (30 min each day, 5 days per week, or 50 min 3 days per week). Keep blood sugar logs with fasting goal of 90-130 mg/dl, post prandial (after you eat) less than 180.  For Hypoglycemia: BS <60 and Hyperglycemia BS >400; contact the clinic ASAP. Annual eye exams and foot exams are recommended.   Encounter for annual physical exam -     PSA  Essential hypertension Chronic and stable.  -     amLODipine (NORVASC) 10 MG tablet; Take 1 tablet (10 mg total) by mouth daily. -     losartan (COZAAR) 50 MG tablet; Take 1 tablet (50 mg total) by mouth daily. Continue all antihypertensives as prescribed.  Remember to bring in your blood pressure log with you for your follow up appointment.  DASH/Mediterranean Diets are healthier choices for HTN.    Mixed hyperlipidemia Chronic and stable.  -     atorvastatin (LIPITOR) 40 MG tablet; Take 1 tablet (40 mg total) by mouth daily. -     Lipid panel INSTRUCTIONS: Work on a low fat, heart healthy diet and participate in regular aerobic exercise program by working out at least 150 minutes per week; 5 days a week-30 minutes per day. Avoid red meat, fried foods. junk foods, sodas, sugary drinks, unhealthy snacking, alcohol and smoking.  Drink at least 48oz of water per day and monitor your carbohydrate intake daily.    Cerebrovascular accident (CVA) due to thrombosis of left posterior  cerebral artery (HCC) -     clopidogrel (PLAVIX) 75 MG tablet; Take 1 tablet (75 mg total) by mouth daily.    Patient has been counseled on age-appropriate routine health concerns for screening and prevention. These are reviewed and up-to-date. Referrals have been placed accordingly. Immunizations are up-to-date or declined.    Subjective:   Chief Complaint  Patient presents with  . Annual Exam   HPI Jon Jon [name] 63 y.o. male presents to office today for annual physical exam.  He has no complaints or concerns today.   Review of Systems  Constitutional: Negative for fever, malaise/fatigue and weight loss.  HENT: Negative.  Negative for nosebleeds.   Eyes: Negative for blurred vision, double vision and photophobia.       Wears glasses  Respiratory: Negative.  Negative for cough and shortness of breath.   Cardiovascular: Negative.  Negative for chest pain, palpitations and leg swelling.  Gastrointestinal: Negative.  Negative for heartburn, nausea and vomiting.  Genitourinary: Negative.   Musculoskeletal: Negative.  Negative for myalgias.  Skin: Negative.   Neurological: Negative.  Negative for dizziness, focal weakness, seizures and headaches.  Endo/Heme/Allergies: Negative.   Psychiatric/Behavioral: Negative.  Negative for suicidal ideas.    Past Medical History:  Diagnosis Date  . Diabetes mellitus without complication ([place])    type II  . History of kidney stones   . Hyperlipidemia   . Hypertension   .  Kidney stones   . Stomach ulcer     Past Surgical History:  Procedure Laterality Date  . CYSTOSCOPY Left 12/10/2016   Procedure: cystoscopy with left ureteral stone extraction ;  Surgeon: Kathie Rhodes, MD;  Location: [facility];  Service: Urology;  Laterality: Left;  . HERNIA REPAIR    . Stomach ulcer      Family History  Problem Relation Age of Onset  . Diabetes Father   . Stroke Father   . Hypertension Father   . Hypertension Mother   . Diabetes Sister   .  Diabetes Son   . Diabetes Daughter     Social History Reviewed with no changes to be made today.   Outpatient Medications Prior to Visit  Medication Sig Dispense Refill  . Blood Glucose Monitoring Suppl (TRUE METRIX METER) w/Device KIT Use as instructed. Twice a day 1 kit 0  . glucose blood (TRUE METRIX BLOOD GLUCOSE TEST) test strip Use as instructed 100 each 12  . TRUEPLUS LANCETS 28G MISC Use as instructed. Twice a day 100 each 3  . amLODipine (NORVASC) 10 MG tablet Take 1 tablet (10 mg total) by mouth daily. 90 tablet 1  . atorvastatin (LIPITOR) 40 MG tablet Take 1 tablet (40 mg total) by mouth daily. 90 tablet 1  . clopidogrel (PLAVIX) 75 MG tablet Take 1 tablet (75 mg total) by mouth daily. 90 tablet 1  . diclofenac sodium (VOLTAREN) 1 % GEL Apply 4 g topically 4 (four) times daily. 100 g 1  . glimepiride (AMARYL) 2 MG tablet TAKE 4 TABLETS BY MOUTH DAILY WITH BREAKFAST. 120 tablet 1  . losartan (COZAAR) 50 MG tablet Take 1 tablet (50 mg total) by mouth daily. 90 tablet 3  . methocarbamol (ROBAXIN) 500 MG tablet Take 1 tablet (500 mg total) by mouth 2 (two) times daily. 20 tablet 0  . sitaGLIPtin-metformin (JANUMET) 50-1000 MG tablet TAKE 1 TABLET BY MOUTH 2 (TWO) TIMES DAILY WITH A MEAL. 180 tablet 1   No facility-administered medications prior to visit.     No Known Allergies     Objective:    BP 121/77 (BP Location: Left Arm, Patient Position: Sitting, Cuff Size: Normal)   Pulse 83   Temp 98.9 F (37.2 C) (Oral)   Resp 18   Ht '5\' 10"'$  (1.778 m)   Wt 205 lb (93 kg)   SpO2 97%   BMI 29.41 kg/m  Wt Readings from Last 3 Encounters:  06/10/18 205 lb (93 kg)  04/18/18 202 lb (91.6 kg)  12/16/17 200 lb 9.6 oz (91 kg)    Physical Exam Vitals signs and nursing note reviewed.  Constitutional:      Appearance: He is well-developed.  HENT:     Head: Normocephalic and atraumatic.     Right Ear: Hearing, tympanic membrane, ear Jon [name] and external ear normal.     Left Ear:  Hearing, tympanic membrane, ear Jon [name] and external ear normal.     Nose: Nose normal. No mucosal edema or rhinorrhea.     Mouth/Throat:     Pharynx: Uvula midline.     Tonsils: No tonsillar exudate. Swelling: 1+ on the right. 1+ on the left.  Eyes:     General: Lids are normal. No scleral icterus.    Conjunctiva/sclera: Conjunctivae normal.     Pupils: Pupils are equal, round, and reactive to light.  Neck:     Musculoskeletal: Normal range of motion and neck supple.     Thyroid: No thyromegaly.  Trachea: No tracheal deviation.  Cardiovascular:     Rate and Rhythm: Normal rate and regular rhythm.     Pulses:          Dorsalis pedis pulses are 2+ on the right side and 2+ on the left side.       Posterior tibial pulses are 2+ on the right side and 2+ on the left side.     Heart sounds: Normal heart sounds. No murmur. No friction rub. No gallop.   Pulmonary:     Effort: Pulmonary effort is normal. No tachypnea or respiratory distress.     Breath sounds: Normal breath sounds. No decreased breath sounds, wheezing, rhonchi or rales.  Chest:     Chest wall: No mass or tenderness.     Breasts:        Right: No inverted nipple, mass, nipple discharge, skin change or tenderness.        Left: No inverted nipple, mass, nipple discharge, skin change or tenderness.  Abdominal:     General: Bowel sounds are normal. There is no distension.     Palpations: Abdomen is soft. There is no mass.     Tenderness: There is no abdominal tenderness. There is no guarding or rebound.     Hernia: There is no hernia in the right inguinal area or left inguinal area.  Genitourinary:    Penis: Normal.      Scrotum/Testes: Normal.        Right: Mass, tenderness or swelling not present. Right testis is descended. Cremasteric reflex is present.         Left: Mass, tenderness or swelling not present. Left testis is descended. Cremasteric reflex is present.   Musculoskeletal: Normal range of motion.        General:  No tenderness or deformity.  Feet:     Right foot:     Protective Sensation: 10 sites tested. 10 sites sensed.     Skin integrity: Skin integrity normal.     Left foot:     Protective Sensation: 10 sites tested. 10 sites sensed.     Skin integrity: Skin integrity normal.  Lymphadenopathy:     Cervical: No cervical adenopathy.     Lower Body: No right inguinal adenopathy. No left inguinal adenopathy.  Skin:    General: Skin is warm and dry.     Capillary Refill: Capillary refill takes less than 2 seconds.     Findings: No erythema.  Neurological:     Mental Status: He is alert and oriented to person, place, and time.     Cranial Nerves: Cranial nerves are intact. No cranial nerve deficit.     Sensory: Sensation is intact.     Motor: Motor function is intact. No abnormal muscle tone.     Coordination: Coordination is intact. Romberg sign negative. Coordination normal.     Gait: Gait is intact.     Deep Tendon Reflexes: Reflexes normal.     Reflex Scores:      Patellar reflexes are 2+ on the right side and 2+ on the left side. Psychiatric:        Attention and Perception: Attention and perception normal.        Mood and Affect: Mood normal.        Speech: Speech normal.        Behavior: Behavior normal. Behavior is cooperative.        Thought Content: Thought content normal.        Cognition  and Memory: Cognition normal.        Judgment: Judgment normal.        Patient has been counseled extensively about nutrition and exercise as well as the importance of adherence with medications and regular follow-up. The patient was given clear instructions to go to ER or return to medical center if symptoms don't improve, worsen or new problems develop. The patient verbalized understanding.   Follow-up: Return in about 2 months (around 08/09/2018) for DM.   Gildardo Pounds, FNP-BC [facility] and [place], [place]   06/10/2018, 1:00 PM

## 2018-06-11 LAB — LIPID PANEL
Chol/HDL Ratio: 4.2 ratio (ref 0.0–5.0)
Cholesterol, Total: 164 mg/dL (ref 100–199)
HDL: 39 mg/dL — AB (ref >39)
LDL Calculated: 107 mg/dL — ABNORMAL HIGH (ref 0–99)
TRIGLYCERIDES: 88 mg/dL (ref 0–149)
VLDL Cholesterol Cal: 18 mg/dL (ref 5–40)

## 2018-06-11 LAB — PSA: PROSTATE SPECIFIC AG, SERUM: 1.1 ng/mL (ref 0.0–4.0)

## 2018-07-01 MED FILL — LOSARTAN POTASSIUM 50 MG TA: 50 | 30 days supply | Qty: 30 | Fill #2

## 2018-07-01 MED FILL — $JANUMET 50-1000 MG TABLET: 50-1000 | 60 days supply | Qty: 120 | Fill #0

## 2018-07-01 MED FILL — AMLODIPINE BESYLATE 10 MG T: 10 | 30 days supply | Qty: 30 | Fill #3

## 2018-07-22 ENCOUNTER — Encounter: Payer: Self-pay | Admitting: Nurse Practitioner

## 2018-07-22 ENCOUNTER — Ambulatory Visit: Payer: 59 | Attending: Nurse Practitioner | Admitting: Nurse Practitioner

## 2018-07-22 VITALS — BP 129/84 | HR 97 | Temp 98.6°F | Ht 70.0 in | Wt 203.0 lb

## 2018-07-22 DIAGNOSIS — E119 Type 2 diabetes mellitus without complications: Secondary | ICD-10-CM | POA: Diagnosis not present

## 2018-07-22 DIAGNOSIS — E785 Hyperlipidemia, unspecified: Secondary | ICD-10-CM | POA: Diagnosis not present

## 2018-07-22 DIAGNOSIS — I10 Essential (primary) hypertension: Secondary | ICD-10-CM

## 2018-07-22 LAB — GLUCOSE, POCT (MANUAL RESULT ENTRY): POC Glucose: 76 mg/dl (ref 70–99)

## 2018-07-22 LAB — POCT GLYCOSYLATED HEMOGLOBIN (HGB A1C): Hemoglobin A1C: 8.1 % — AB (ref 4.0–5.6)

## 2018-07-22 NOTE — Progress Notes (Signed)
 Assessment & Plan:  Jon [name] was seen today for follow-up.  Diagnoses and all orders for this visit:  Type 2 diabetes mellitus without complication, without long-term current use of insulin (HCC) -     Glucose (CBG) -     HgB A1c  Essential hypertension Continue all antihypertensives as prescribed.  Remember to bring in your blood pressure log with you for your follow up appointment.  DASH/Mediterranean Diets are healthier choices for HTN.   Dyslipidemia INSTRUCTIONS: Work on a low fat, heart healthy diet and participate in regular aerobic exercise program by working out at least 150 minutes per week; 5 days a week-30 minutes per day. Avoid red meat, fried foods. junk foods, sodas, sugary drinks, unhealthy snacking, alcohol and smoking.  Drink at least 48oz of water per day and monitor your carbohydrate intake daily.    Patient has been counseled on age-appropriate routine health concerns for screening and prevention. These are reviewed and up-to-date. Referrals have been placed accordingly. Immunizations are up-to-date or declined.    Subjective:   Chief Complaint  Patient presents with  . Follow-up    Pt. is here to follow-up on diabetes.    [name] 62 y.o. male presents to office today for follow up to DM, HPL and HTN.  He is stressed about his mother whose health is failing. She was recently diagnosed with cancer and he is on his way to [place] later this week to visit his mother.   DM TYPE 2 Chronic and stable. Monitoring blood glucose levels every day. Average readings: "some low readings". Taking Janumet 50-1000 mg BID as prescribed. Denies any hypo or hyperglycemic symptoms. LDL not at goal. Taking atorvastatin 40 mg daily as prescribed as well as ARB. States he has not been diet or exercise compliant over the past few months due to his mother's illness and stress. Lab Results  Component Value Date   HGBA1C 8.1 (A) 07/22/2018   Lab Results  Component Value  Date   HGBA1C 8.3 (A) 04/18/2018   Lab Results  Component Value Date   LDLCALC 107 (H) 06/10/2018    Essential Hypertension Blood pressure is well controlled. Taking amlodipine 10 mg and losartan 50 mg daily as prescribed.He denies chest pain, shortness of breath, palpitations, lightheadedness, dizziness, headaches, visual disturbances or BLE edema.  BP Readings from Last 3 Encounters:  07/22/18 129/84  06/10/18 121/77  05/09/18 (!) 172/80    Mixed Hyperlipidemia Denies statin intolerance or myalgias. Taking atorvastatin as prescribed. Needs tighter control of diet.  Lab Results  Component Value Date   CHOL 164 06/10/2018   CHOL 136 04/27/2017   CHOL 138 03/13/2017   Lab Results  Component Value Date   HDL 39 (L) 06/10/2018   HDL 31 (L) 04/27/2017   HDL 33.50 (L) 03/13/2017   Lab Results  Component Value Date   LDLCALC 107 (H) 06/10/2018   LDLCALC 83 04/27/2017   LDLCALC 88 03/13/2017   Lab Results  Component Value Date   TRIG 88 06/10/2018   TRIG 108 04/27/2017   TRIG 79.0 03/13/2017   Lab Results  Component Value Date   CHOLHDL 4.2 06/10/2018   CHOLHDL 4.4 04/27/2017   CHOLHDL 4 03/13/2017   Review of Systems  Constitutional: Negative for fever, malaise/fatigue and weight loss.  HENT: Negative.  Negative for nosebleeds.   Eyes: Negative.  Negative for blurred vision, double vision and photophobia.  Respiratory: Negative.  Negative for cough and shortness of breath.   Cardiovascular:   Negative.  Negative for chest pain, palpitations and leg swelling.  Gastrointestinal: Negative.  Negative for heartburn, nausea and vomiting.  Musculoskeletal: Negative.  Negative for myalgias.  Neurological: Negative.  Negative for dizziness, focal weakness, seizures and headaches.  Psychiatric/Behavioral: Negative.  Negative for suicidal ideas.    Past Medical History:  Diagnosis Date  . Diabetes mellitus without complication ([place])    type II  . History of kidney stones    . Hyperlipidemia   . Hypertension   . Kidney stones   . Stomach ulcer     Past Surgical History:  Procedure Laterality Date  . CYSTOSCOPY Left 12/10/2016   Procedure: cystoscopy with left ureteral stone extraction ;  Surgeon: Kathie Rhodes, MD;  Location: [facility];  Service: Urology;  Laterality: Left;  . HERNIA REPAIR    . Stomach ulcer      Family History  Problem Relation Age of Onset  . Diabetes Father   . Stroke Father   . Hypertension Father   . Hypertension Mother   . Diabetes Sister   . Diabetes Son   . Diabetes Daughter     Social History Reviewed with no changes to be made today.   Outpatient Medications Prior to Visit  Medication Sig Dispense Refill  . amLODipine (NORVASC) 10 MG tablet Take 1 tablet (10 mg total) by mouth daily. 90 tablet 1  . atorvastatin (LIPITOR) 40 MG tablet Take 1 tablet (40 mg total) by mouth daily. 90 tablet 1  . Blood Glucose Monitoring Suppl (TRUE METRIX METER) w/Device KIT Use as instructed. Twice a day 1 kit 0  . clopidogrel (PLAVIX) 75 MG tablet Take 1 tablet (75 mg total) by mouth daily. 90 tablet 1  . glimepiride (AMARYL) 2 MG tablet Take 1 tablet (2 mg total) by mouth daily with breakfast. 90 tablet 1  . glucose blood (TRUE METRIX BLOOD GLUCOSE TEST) test strip Use as instructed 100 each 12  . losartan (COZAAR) 50 MG tablet Take 1 tablet (50 mg total) by mouth daily. 90 tablet 3  . sitaGLIPtin-metformin (JANUMET) 50-1000 MG tablet TAKE 1 TABLET BY MOUTH 2 (TWO) TIMES DAILY WITH A MEAL. 180 tablet 1  . TRUEPLUS LANCETS 28G MISC Use as instructed. Twice a day 100 each 3   No facility-administered medications prior to visit.     No Known Allergies     Objective:    BP 129/84 (BP Location: Left Arm, Patient Position: Sitting, Cuff Size: Normal)   Pulse 97   Temp 98.6 F (37 C) (Oral)   Ht 5' 10" (1.778 m)   Wt 203 lb (92.1 kg)   SpO2 95%   BMI 29.13 kg/m  Wt Readings from Last 3 Encounters:  07/22/18 203 lb (92.1 kg)    06/10/18 205 lb (93 kg)  04/18/18 202 lb (91.6 kg)    Physical Exam Vitals signs and nursing note reviewed.  Constitutional:      Appearance: He is well-developed.  HENT:     Head: Normocephalic and atraumatic.  Neck:     Musculoskeletal: Normal range of motion.  Cardiovascular:     Rate and Rhythm: Normal rate and regular rhythm.     Heart sounds: Normal heart sounds. No murmur. No friction rub. No gallop.   Pulmonary:     Effort: Pulmonary effort is normal. No tachypnea or respiratory distress.     Breath sounds: Normal breath sounds. No decreased breath sounds, wheezing, rhonchi or rales.  Chest:     Chest wall:  No tenderness.  Abdominal:     General: Bowel sounds are normal.     Palpations: Abdomen is soft.  Musculoskeletal: Normal range of motion.  Skin:    General: Skin is warm and dry.  Neurological:     Mental Status: He is alert and oriented to person, place, and time.     Coordination: Coordination normal.  Psychiatric:        Behavior: Behavior normal. Behavior is cooperative.        Thought Content: Thought content normal.        Judgment: Judgment normal.          Patient has been counseled extensively about nutrition and exercise as well as the importance of adherence with medications and regular follow-up. The patient was given clear instructions to go to ER or return to medical center if symptoms don't improve, worsen or new problems develop. The patient verbalized understanding.   Follow-up: Return in about 3 months (around 10/22/2018).   Gildardo Pounds, FNP-BC [facility] and [place], [place]   07/22/2018, 10:16 PM

## 2018-08-08 MED FILL — GLIMEPIRIDE 2 MG TABS: 2 | 30 days supply | Qty: 120 | Fill #1

## 2018-08-08 MED FILL — ATORVASTATIN CALCIUM 40 MG: 40 | 30 days supply | Qty: 30 | Fill #2

## 2018-08-08 MED FILL — CLOPIDOGREL 75 MG TABLET: 75 | 30 days supply | Qty: 30 | Fill #4

## 2018-08-26 MED FILL — $JANUMET 50-1000 MG TABLET: 50-1000 | 90 days supply | Qty: 180 | Fill #1

## 2018-08-28 ENCOUNTER — Other Ambulatory Visit: Payer: Self-pay | Admitting: Pharmacist

## 2018-08-28 DIAGNOSIS — E119 Type 2 diabetes mellitus without complications: Secondary | ICD-10-CM

## 2018-08-28 MED ORDER — GLIMEPIRIDE 2 MG PO TABS: 2.0000 mg | ORAL_TABLET | Freq: Every day | ORAL | 0 refills | Status: DC

## 2018-09-02 MED FILL — GLIMEPIRIDE 2 MG TABS: 2 | 30 days supply | Qty: 30 | Fill #0

## 2018-09-08 ENCOUNTER — Other Ambulatory Visit: Payer: Self-pay | Admitting: Nurse Practitioner

## 2018-09-08 DIAGNOSIS — E119 Type 2 diabetes mellitus without complications: Secondary | ICD-10-CM

## 2018-09-08 MED FILL — TRUE METRIX TEST STRIP: 25 days supply | Qty: 100 | Fill #0

## 2018-10-14 MED FILL — ATORVASTATIN CALCIUM 40 MG: 40 | 30 days supply | Qty: 30 | Fill #3

## 2018-10-14 MED FILL — GLIMEPIRIDE 2 MG TABS: 2 | 30 days supply | Qty: 30 | Fill #1

## 2018-10-14 MED FILL — CLOPIDOGREL 75 MG TABLET: 75 | 30 days supply | Qty: 30 | Fill #5

## 2018-10-14 MED FILL — LOSARTAN POTASSIUM 50 MG TA: 50 | 30 days supply | Qty: 30 | Fill #3

## 2018-10-24 ENCOUNTER — Other Ambulatory Visit: Payer: Self-pay

## 2018-10-24 ENCOUNTER — Encounter: Payer: Self-pay | Admitting: Nurse Practitioner

## 2018-10-24 ENCOUNTER — Ambulatory Visit: Payer: 59 | Attending: Nurse Practitioner | Admitting: Nurse Practitioner

## 2018-10-24 VITALS — BP 131/72 | HR 92 | Temp 98.3°F | Resp 16 | Wt 203.0 lb

## 2018-10-24 DIAGNOSIS — E119 Type 2 diabetes mellitus without complications: Secondary | ICD-10-CM | POA: Diagnosis not present

## 2018-10-24 DIAGNOSIS — E782 Mixed hyperlipidemia: Secondary | ICD-10-CM | POA: Diagnosis not present

## 2018-10-24 DIAGNOSIS — I10 Essential (primary) hypertension: Secondary | ICD-10-CM | POA: Diagnosis not present

## 2018-10-24 LAB — POCT GLYCOSYLATED HEMOGLOBIN (HGB A1C): HbA1c, POC (controlled diabetic range): 8.7 % — AB (ref 0.0–7.0)

## 2018-10-24 LAB — GLUCOSE, POCT (MANUAL RESULT ENTRY): POC Glucose: 184 mg/dl — AB (ref 70–99)

## 2018-10-24 MED ORDER — GLIMEPIRIDE 4 MG PO TABS: 4.0000 mg | ORAL_TABLET | Freq: Every day | ORAL | 0 refills | Status: DC

## 2018-10-24 NOTE — Progress Notes (Addendum)
Assessment & Plan:  Jon [name] was seen today for diabetes.  Diagnoses and all orders for this visit:  Type 2 diabetes mellitus without complication, without long-term current use of insulin (HCC) -     Glucose (CBG) -     HgB A1c -     glimepiride (AMARYL) 4 MG tablet; Take 1 tablet (4 mg total) by mouth daily with breakfast. Diabetes is poorly controlled. Advised patient to keep a fasting blood sugar log fast, 2 hours post lunch and bedtime which will be reviewed at the next office visit.  Essential hypertension -     CBC -     Basic metabolic panel Continue all antihypertensives as prescribed.  Remember to bring in your blood pressure log with you for your follow up appointment.  DASH/Mediterranean Diets are healthier choices for HTN.    Mixed hyperlipidemia INSTRUCTIONS: Work on a low fat, heart healthy diet and participate in regular aerobic exercise program by working out at least 150 minutes per week; 5 days a week-30 minutes per day. Avoid red meat, fried foods. junk foods, sodas, sugary drinks, unhealthy snacking, alcohol and smoking.  Drink at least 48oz of water per day and monitor your carbohydrate intake daily.     Patient has been counseled on age-appropriate routine health concerns for screening and prevention. These are reviewed and up-to-date. Referrals have been placed accordingly. Immunizations are up-to-date or declined.    Subjective:   Chief Complaint  Patient presents with  . Diabetes   [name] 63 y.o. male presents to office today for follow up.  has a past medical history of Diabetes mellitus without complication ([place]), History of kidney stones, Hyperlipidemia, Hypertension, Kidney stones, and Stomach ulcer.   DM Type 2 Current medications: Janumet 50-1000 mg bid. Endorses medication compliance. Unfortunately his glimepiride dosage was switched from 8 mg back to 2 mg. Unclear where dosage change occurred. Will increase back to 4 mg and likely  titrate up to 8 if needed. He will return in 4 weeks for meter check. He currently denies hypo or hyperglycemic symptoms. UTD for eye exam.  Lab Results  Component Value Date   HGBA1C 8.7 (A) 10/24/2018    Lab Results  Component Value Date   HGBA1C 8.1 (A) 07/22/2018    Hyperlipidemia Patient presents for follow up to hyperlipidemia.  He is medication compliant taking atorvastatin 40 mg daily. He is diet compliant and denies exertional chest pressure/discomfort, lower extremity edema and skin xanthelasma or statin intolerance including myalgias. LDL not at goal <70 Lab Results  Component Value Date   CHOL 164 06/10/2018   Lab Results  Component Value Date   HDL 39 (L) 06/10/2018   Lab Results  Component Value Date   LDLCALC 107 (H) 06/10/2018   Lab Results  Component Value Date   TRIG 88 06/10/2018   Lab Results  Component Value Date   CHOLHDL 4.2 06/10/2018     Essential Hypertension Current medications: amlodipine 10 mg daily, losartan 50 mg daily.  Checking blood pressure at home with average 120-130/80s. Denies chest pain, shortness of breath, palpitations, lightheadedness, dizziness, headaches or BLE edema.  BP Readings from Last 3 Encounters:  10/24/18 131/72  07/22/18 129/84  06/10/18 121/77   Review of Systems  Constitutional: Negative for fever, malaise/fatigue and weight loss.  HENT: Negative.  Negative for nosebleeds.   Eyes: Negative.  Negative for blurred vision, double vision and photophobia.  Respiratory: Negative.  Negative for cough and shortness of breath.  Cardiovascular: Negative.  Negative for chest pain, palpitations and leg swelling.  Gastrointestinal: Negative.  Negative for heartburn, nausea and vomiting.  Musculoskeletal: Negative.  Negative for myalgias.  Neurological: Negative.  Negative for dizziness, focal weakness, seizures and headaches.  Psychiatric/Behavioral: Negative.  Negative for suicidal ideas.       Past Medical History:   Diagnosis Date  . Diabetes mellitus without complication ([place])    type II  . History of kidney stones   . Hyperlipidemia   . Hypertension   . Kidney stones   . Stomach ulcer     Past Surgical History:  Procedure Laterality Date  . CYSTOSCOPY Left 12/10/2016   Procedure: cystoscopy with left ureteral stone extraction ;  Surgeon: Kathie Rhodes, MD;  Location: [facility];  Service: Urology;  Laterality: Left;  . HERNIA REPAIR    . Stomach ulcer      Family History  Problem Relation Age of Onset  . Diabetes Father   . Stroke Father   . Hypertension Father   . Hypertension Mother   . Diabetes Sister   . Diabetes Son   . Diabetes Daughter     Social History Reviewed with no changes to be made today.   Outpatient Medications Prior to Visit  Medication Sig Dispense Refill  . amLODipine (NORVASC) 10 MG tablet Take 1 tablet (10 mg total) by mouth daily. 90 tablet 1  . Blood Glucose Monitoring Suppl (TRUE METRIX METER) w/Device KIT Use as instructed. Twice a day 1 kit 0  . clopidogrel (PLAVIX) 75 MG tablet Take 1 tablet (75 mg total) by mouth daily. 90 tablet 1  . losartan (COZAAR) 50 MG tablet Take 1 tablet (50 mg total) by mouth daily. 90 tablet 3  . sitaGLIPtin-metformin (JANUMET) 50-1000 MG tablet TAKE 1 TABLET BY MOUTH 2 (TWO) TIMES DAILY WITH A MEAL. 180 tablet 1  . TRUEPLUS LANCETS 28G MISC Use as instructed. Twice a day 100 each 3  . glimepiride (AMARYL) 2 MG tablet Take 1 tablet (2 mg total) by mouth daily with breakfast. 90 tablet 0  . atorvastatin (LIPITOR) 40 MG tablet Take 1 tablet (40 mg total) by mouth daily. 90 tablet 1  . glucose blood test strip Use as instructed 100 each 12   No facility-administered medications prior to visit.     No Known Allergies     Objective:    BP 131/72 (BP Location: Left Arm, Cuff Size: Normal)   Pulse 92   Temp 98.3 F (36.8 C) (Oral)   Resp 16   Wt 203 lb (92.1 kg)   SpO2 96%   BMI 29.13 kg/m  Wt Readings from Last 3  Encounters:  10/24/18 203 lb (92.1 kg)  07/22/18 203 lb (92.1 kg)  06/10/18 205 lb (93 kg)    Physical Exam Vitals signs and nursing note reviewed.  Constitutional:      Appearance: He is well-developed.  HENT:     Head: Normocephalic and atraumatic.  Neck:     Musculoskeletal: Normal range of motion.  Cardiovascular:     Rate and Rhythm: Normal rate and regular rhythm.     Pulses:          Dorsalis pedis pulses are 2+ on the right side and 2+ on the left side.       Posterior tibial pulses are 2+ on the right side and 2+ on the left side.     Heart sounds: Normal heart sounds. No murmur. No friction rub. No gallop.  Pulmonary:     Effort: Pulmonary effort is normal. No tachypnea or respiratory distress.     Breath sounds: Normal breath sounds. No decreased breath sounds, wheezing, rhonchi or rales.  Chest:     Chest wall: No tenderness.  Abdominal:     General: Bowel sounds are normal.     Palpations: Abdomen is soft.  Musculoskeletal: Normal range of motion.  Feet:     Right foot:     Protective Sensation: 10 sites tested. 10 sites sensed.     Skin integrity: Skin integrity normal.     Left foot:     Protective Sensation: 10 sites tested. 10 sites sensed.     Skin integrity: Skin integrity normal.  Skin:    General: Skin is warm and dry.  Neurological:     Mental Status: He is alert and oriented to person, place, and time.     Coordination: Coordination normal.  Psychiatric:        Behavior: Behavior normal. Behavior is cooperative.        Thought Content: Thought content normal.        Judgment: Judgment normal.        Patient has been counseled extensively about nutrition and exercise as well as the importance of adherence with medications and regular follow-up. The patient was given clear instructions to go to ER or return to medical center if symptoms don't improve, worsen or new problems develop. The patient verbalized understanding.   Follow-up: Return in  about 4 weeks (around 11/21/2018) for meter check with luke; increased glimepiride.   Gildardo Pounds, FNP-BC [facility] and [place], [place]   10/24/2018, 6:08 PM

## 2018-10-24 NOTE — Progress Notes (Signed)
F/u DM Taking too many glimepiride tablets. Taking 2 tablet instead of 1. This happened within the last two months

## 2018-10-25 LAB — CBC
Hematocrit: 39.5 % (ref 37.5–51.0)
Hemoglobin: 12.6 g/dL — ABNORMAL LOW (ref 13.0–17.7)
MCH: 24.3 pg — ABNORMAL LOW (ref 26.6–33.0)
MCHC: 31.9 g/dL (ref 31.5–35.7)
MCV: 76 fL — ABNORMAL LOW (ref 79–97)
Platelets: 272 10*3/uL (ref 150–450)
RBC: 5.18 x10E6/uL (ref 4.14–5.80)
RDW: 16.3 % — ABNORMAL HIGH (ref 11.6–15.4)
WBC: 5.8 10*3/uL (ref 3.4–10.8)

## 2018-10-25 LAB — BASIC METABOLIC PANEL
BUN/Creatinine Ratio: 13 (ref 10–24)
BUN: 14 mg/dL (ref 8–27)
CO2: 24 mmol/L (ref 20–29)
Calcium: 9.8 mg/dL (ref 8.6–10.2)
Chloride: 101 mmol/L (ref 96–106)
Creatinine, Ser: 1.06 mg/dL (ref 0.76–1.27)
GFR calc Af Amer: 85 mL/min/{1.73_m2} (ref >59)
GFR calc non Af Amer: 74 mL/min/{1.73_m2} (ref >59)
Glucose: 189 mg/dL — ABNORMAL HIGH (ref 65–99)
Potassium: 4.3 mmol/L (ref 3.5–5.2)
Sodium: 140 mmol/L (ref 134–144)

## 2018-11-03 ENCOUNTER — Other Ambulatory Visit: Payer: Self-pay | Admitting: Nurse Practitioner

## 2018-11-03 DIAGNOSIS — I63332 Cerebral infarction due to thrombosis of left posterior cerebral artery: Secondary | ICD-10-CM

## 2018-11-03 DIAGNOSIS — E119 Type 2 diabetes mellitus without complications: Secondary | ICD-10-CM

## 2018-11-03 DIAGNOSIS — E782 Mixed hyperlipidemia: Secondary | ICD-10-CM

## 2018-11-03 DIAGNOSIS — I10 Essential (primary) hypertension: Secondary | ICD-10-CM

## 2018-11-10 MED FILL — TRUE METRIX TEST STRIP: 25 days supply | Qty: 100 | Fill #1

## 2018-11-10 MED FILL — GLIMEPIRIDE 2 MG TABS: 2 | 30 days supply | Qty: 30 | Fill #2

## 2018-11-11 MED FILL — $JANUMET 50-1000 MG TABLET: 50-1000 | 30 days supply | Qty: 60 | Fill #2

## 2018-11-28 ENCOUNTER — Ambulatory Visit: Payer: 59 | Attending: Family Medicine | Admitting: Pharmacist

## 2018-11-28 ENCOUNTER — Other Ambulatory Visit: Payer: Self-pay

## 2018-11-28 ENCOUNTER — Encounter: Payer: Self-pay | Admitting: Pharmacist

## 2018-11-28 DIAGNOSIS — E119 Type 2 diabetes mellitus without complications: Secondary | ICD-10-CM | POA: Diagnosis not present

## 2018-11-28 LAB — GLUCOSE, POCT (MANUAL RESULT ENTRY): POC Glucose: 123 mg/dl — AB (ref 70–99)

## 2018-11-28 NOTE — Progress Notes (Signed)
    S:    PCP: Zelda   No chief complaint on file.  Patient arrives in good spirits.  Presents for diabetes evaluation, education, and management. Patient was referred and last seen by Primary Care Provider on 10/24/18. A1c at that appointment was elevated.   Family/Social History:  - FHx: DM (father, sister, daughter, son), HTN (mother, father) - Tobacco: never smoker - Alcohol: socially   Human resources officer affordability: [place]  Patient reports adherence with medications.  Current diabetes medications include: glimepiride 4 mg daily, Janumet 50-1000 mg BID Current hypertension medications include: amlodipine 10 mg daily, losartan 50 mg daily Current hyperlipidemia medications include: atorvastatin 40 mg daily  Patient denies hypoglycemic events.  Patient reported dietary habits:  - Reports that he was not adherent to his diet before seeing Zelda last month  Patient-reported exercise habits:  - Limited    Patient denies nocturia.  Patient denies neuropathy. Patient denies visual changes. Patient reports self foot exams.    O:  POCT 123 (ate ~2 hours ago)  Home fasting CBG: 110s - 120s  2 hour post-prandial/random CBG: Denies any readings > 180.   Lab Results  Component Value Date   HGBA1C 8.7 (A) 10/24/2018   There were no vitals filed for this visit.  Lipid Panel     Component Value Date/Time   CHOL 164 06/10/2018 1138   TRIG 88 06/10/2018 1138   HDL 39 (L) 06/10/2018 1138   CHOLHDL 4.2 06/10/2018 1138   CHOLHDL 4.4 04/27/2017 0531   VLDL 22 04/27/2017 0531   LDLCALC 107 (H) 06/10/2018 1138   Clinical ASCVD: Yes  The ASCVD Risk score Mikey Bussing DC Jr., et al., 2013) failed to calculate for the following reasons:   The patient has a prior MI or stroke diagnosis   A/P: Diabetes longstanding currently uncontrolled based on A1c but home sugars have improved. Patient is able to verbalize appropriate hypoglycemia management plan. Patient is  adherent with medication. His A1c came back elevated at his last appt and he attributes this to dietary indiscretion over the covid-19 quarantine. Since his visit with his PCP, he has made lifestyle changes to improve blood sugar at home.   Of note, pt with hx of CVA. I recommend Trulicity or Ozempic should he need continued glycemic control improvement in the future.    -Continued current regimen.  -Extensively discussed pathophysiology of DM, recommended lifestyle interventions, dietary effects on glycemic control -Counseled on s/sx of and management of hypoglycemia -Next A1C anticipated 9/20.   ASCVD risk - secondary prevention in patient with DM. Last LDL is not controlled. High intensity statin indicated. Pt may benefit from addition of Zetia in the future if LDL is still above goal.  -Continued atorvastatin 40 mg.   HM:  - Pt is due Pneumovax vaccine  - Will address at next appt.   Written patient instructions provided. Total time in face to face counseling 15 minutes.   Follow up Pharmacist Clinic Visit in 1 month.     Benard Halsted, PharmD, [place] [phone]

## 2018-11-28 NOTE — Patient Instructions (Signed)
Thank you for coming to see me today. Please do the following:  1. Continue Janumet and glimepiride.  2. Continue checking blood sugars at home.  3. Continue making the lifestyle changes we've discussed together during our visit. Diet and exercise play a significant role in improving your blood sugars.  4. Follow-up with me in 1 month.   Hypoglycemia or low blood sugar:   Low blood sugar can happen quickly and may become an emergency if not treated right away.   While this shouldn't happen often, it can be brought upon if you skip a meal or do not eat enough. Also, if your insulin or other diabetes medications are dosed too high, this can cause your blood sugar to go to low.   Warning signs of low blood sugar include: 1. Feeling shaky or dizzy 2. Feeling weak or tired  3. Excessive hunger 4. Feeling anxious or upset  5. Sweating even when you aren't exercising  What to do if I experience low blood sugar? 1. Check your blood sugar with your meter. If lower than 70, proceed to step 2.  2. Treat with 3-4 glucose tablets or 3 packets of regular sugar. If these aren't around, you can try hard candy. Yet another option would be to drink 4 ounces of fruit juice or 6 ounces of REGULAR soda.  3. Re-check your sugar in 15 minutes. If it is still below 70, do what you did in step 2 again. If has come back up, go ahead and eat a snack or small meal at this time.

## 2018-12-09 ENCOUNTER — Other Ambulatory Visit: Payer: Self-pay | Admitting: Pharmacist

## 2018-12-09 DIAGNOSIS — E119 Type 2 diabetes mellitus without complications: Secondary | ICD-10-CM

## 2018-12-09 MED ORDER — JANUMET 50-1000 MG PO TABS: ORAL_TABLET | ORAL | 2 refills | Status: DC

## 2018-12-09 MED FILL — $JANUMET 50-1000 MG TABLET: 50-1000 | 60 days supply | Qty: 120 | Fill #0

## 2018-12-11 ENCOUNTER — Other Ambulatory Visit: Payer: Self-pay | Admitting: Family Medicine

## 2018-12-11 DIAGNOSIS — E119 Type 2 diabetes mellitus without complications: Secondary | ICD-10-CM

## 2018-12-11 MED FILL — GLIMEPIRIDE 4 MG TABS: 4 | 30 days supply | Qty: 30 | Fill #0

## 2018-12-26 ENCOUNTER — Other Ambulatory Visit: Payer: Self-pay

## 2018-12-26 ENCOUNTER — Ambulatory Visit: Payer: 59 | Attending: Nurse Practitioner | Admitting: Pharmacist

## 2018-12-26 ENCOUNTER — Encounter: Payer: Self-pay | Admitting: Pharmacist

## 2018-12-26 DIAGNOSIS — E119 Type 2 diabetes mellitus without complications: Secondary | ICD-10-CM

## 2018-12-26 LAB — GLUCOSE, POCT (MANUAL RESULT ENTRY): POC Glucose: 151 mg/dl — AB (ref 70–99)

## 2018-12-26 NOTE — Progress Notes (Signed)
    S:    PCP: Zelda   No chief complaint on file.  Patient arrives in good spirits.  Presents for diabetes evaluation, education, and management. Patient was referred and last seen by Primary Care Provider on 10/24/18. I saw him on 11/28/18.   Family/Social History:  - FHx: DM (father, sister, daughter, son), HTN (mother, father) - Tobacco: never smoker - Alcohol: socially   Human resources officer affordability: [place]  Patient reports adherence with medications.  Current diabetes medications include: glimepiride 4 mg daily, Janumet 50-1000 mg BID Current hypertension medications include: amlodipine 10 mg daily, losartan 50 mg daily Current hyperlipidemia medications include: atorvastatin 40 mg daily  Patient denies hypoglycemic events.  Patient reported dietary habits:  - Reports doing a better job with decreasing carbs - Denies eating sweets or drinking sweetened beverages  Patient-reported exercise habits:  - Limited    Patient denies nocturia.  Patient denies neuropathy. Patient denies visual changes. Patient reports self foot exams.    O:  POCT 151 (ate ~2 hours ago)  Home fasting CBG: 110s - 120s  2 hour post-prandial/random CBG: Denies any readings > 180.  Lab Results  Component Value Date   HGBA1C 8.7 (A) 10/24/2018   There were no vitals filed for this visit.  Lipid Panel     Component Value Date/Time   CHOL 164 06/10/2018 1138   TRIG 88 06/10/2018 1138   HDL 39 (L) 06/10/2018 1138   CHOLHDL 4.2 06/10/2018 1138   CHOLHDL 4.4 04/27/2017 0531   VLDL 22 04/27/2017 0531   LDLCALC 107 (H) 06/10/2018 1138   Clinical ASCVD: Yes  The ASCVD Risk score Mikey Bussing DC Jr., et al., 2013) failed to calculate for the following reasons:   The patient has a prior MI or stroke diagnosis   A/P: Diabetes longstanding currently uncontrolled based on A1c but home sugars have improved. Patient is able to verbalize appropriate hypoglycemia management plan.  Patient is adherent with medication.   -Continued current regimen.  -Extensively discussed pathophysiology of DM, recommended lifestyle interventions, dietary effects on glycemic control -Counseled on s/sx of and management of hypoglycemia -Next A1C anticipated 9/20.   ASCVD risk - secondary prevention in patient with DM. Last LDL is not controlled. High intensity statin indicated. Pt may benefit from addition of Zetia in the future if LDL is still above goal.  -Continued atorvastatin 40 mg.   HM:  - Pt is due Pneumovax vaccine  - Will defer to PCP.  Written patient instructions provided. Total time in face to face counseling 15 minutes.   Follow up PCP visit in 1 month.  Benard Halsted, PharmD, [place] [phone]

## 2019-01-05 ENCOUNTER — Other Ambulatory Visit: Payer: Self-pay | Admitting: Pharmacist

## 2019-01-05 DIAGNOSIS — E119 Type 2 diabetes mellitus without complications: Secondary | ICD-10-CM

## 2019-01-05 MED ORDER — GLIMEPIRIDE 4 MG PO TABS: 4.0000 mg | ORAL_TABLET | Freq: Every day | ORAL | 0 refills | Status: DC

## 2019-01-30 ENCOUNTER — Ambulatory Visit: Payer: 59 | Admitting: Nurse Practitioner

## 2019-02-05 ENCOUNTER — Telehealth: Payer: Self-pay | Admitting: Pharmacist

## 2019-02-05 ENCOUNTER — Other Ambulatory Visit: Payer: Self-pay | Admitting: Pharmacist

## 2019-02-05 DIAGNOSIS — E119 Type 2 diabetes mellitus without complications: Secondary | ICD-10-CM

## 2019-02-05 MED ORDER — ONETOUCH DELICA LANCETS 33G MISC: 3 refills | Status: DC

## 2019-02-05 MED ORDER — ONETOUCH VERIO VI STRP: ORAL_STRIP | 3 refills | Status: DC

## 2019-02-05 MED ORDER — ONETOUCH VERIO W/DEVICE KIT: PACK | 0 refills | Status: DC

## 2019-02-05 MED FILL — JANUMET 50-1,000 MG TABLET: 50-1000 | 30 days supply | Qty: 60 | Fill #1

## 2019-02-05 MED FILL — ONETOUCH DELICA PLUS LANCET: 31 days supply | Qty: 100 | Fill #0

## 2019-02-05 MED FILL — ONETOUCH VERIO REFLECT W/DE: W/DEVICE | 30 days supply | Qty: 1 | Fill #0

## 2019-02-05 MED FILL — ONE TOUCH VERIO TEST STRIP: 31 days supply | Qty: 50 | Fill #0

## 2019-02-05 NOTE — Telephone Encounter (Signed)
Received notice from [facility] pharmacy that patient's Janumet is not covered by insurance. His insurance prefers the following:   - Jentadueto (linagliptin/metformin) - Kombiglyze (saxagliptin/metformin)  I recommend switch to [employer]. Will send to PCP for review.

## 2019-02-08 ENCOUNTER — Other Ambulatory Visit: Payer: Self-pay | Admitting: Nurse Practitioner

## 2019-02-08 ENCOUNTER — Other Ambulatory Visit: Payer: Self-pay | Admitting: Family Medicine

## 2019-02-08 DIAGNOSIS — I10 Essential (primary) hypertension: Secondary | ICD-10-CM

## 2019-02-08 DIAGNOSIS — E782 Mixed hyperlipidemia: Secondary | ICD-10-CM

## 2019-02-08 DIAGNOSIS — I63332 Cerebral infarction due to thrombosis of left posterior cerebral artery: Secondary | ICD-10-CM

## 2019-02-10 ENCOUNTER — Other Ambulatory Visit: Payer: Self-pay | Admitting: Nurse Practitioner

## 2019-02-10 MED ORDER — LINAGLIPTIN-METFORMIN HCL 2.5-1000 MG PO TABS: 1.0000 | ORAL_TABLET | Freq: Two times a day (BID) | ORAL | 1 refills | Status: DC

## 2019-02-12 ENCOUNTER — Other Ambulatory Visit: Payer: Self-pay | Admitting: Family Medicine

## 2019-02-12 DIAGNOSIS — E119 Type 2 diabetes mellitus without complications: Secondary | ICD-10-CM

## 2019-02-15 ENCOUNTER — Encounter: Payer: Self-pay | Admitting: Nurse Practitioner

## 2019-02-28 ENCOUNTER — Other Ambulatory Visit: Payer: Self-pay | Admitting: Family Medicine

## 2019-02-28 DIAGNOSIS — E119 Type 2 diabetes mellitus without complications: Secondary | ICD-10-CM

## 2019-03-06 ENCOUNTER — Ambulatory Visit: Payer: 59 | Attending: Nurse Practitioner | Admitting: Nurse Practitioner

## 2019-03-06 ENCOUNTER — Other Ambulatory Visit: Payer: Self-pay

## 2019-03-06 ENCOUNTER — Encounter: Payer: Self-pay | Admitting: Nurse Practitioner

## 2019-03-06 VITALS — BP 119/72 | HR 91 | Temp 98.0°F | Ht 70.0 in | Wt 203.0 lb

## 2019-03-06 DIAGNOSIS — I10 Essential (primary) hypertension: Secondary | ICD-10-CM

## 2019-03-06 DIAGNOSIS — E785 Hyperlipidemia, unspecified: Secondary | ICD-10-CM

## 2019-03-06 DIAGNOSIS — E119 Type 2 diabetes mellitus without complications: Secondary | ICD-10-CM | POA: Diagnosis not present

## 2019-03-06 DIAGNOSIS — D649 Anemia, unspecified: Secondary | ICD-10-CM

## 2019-03-06 LAB — GLUCOSE, POCT (MANUAL RESULT ENTRY): POC Glucose: 141 mg/dl — AB (ref 70–99)

## 2019-03-06 LAB — POCT GLYCOSYLATED HEMOGLOBIN (HGB A1C): Hemoglobin A1C: 8.7 % — AB (ref 4.0–5.6)

## 2019-03-06 MED ORDER — TRULICITY 0.75 MG/0.5ML ~~LOC~~ SOAJ: SUBCUTANEOUS | 6 refills | Status: DC

## 2019-03-06 MED ORDER — METFORMIN HCL 1000 MG PO TABS: 1000.0000 mg | ORAL_TABLET | Freq: Two times a day (BID) | ORAL | 1 refills | Status: DC

## 2019-03-06 MED ORDER — BD PEN NEEDLE MINI U/F 31G X 5 MM MISC: 3 refills | Status: DC

## 2019-03-06 NOTE — Progress Notes (Signed)
Assessment & Plan:  Jon [name] was seen today for follow-up.  Diagnoses and all orders for this visit:  Type 2 diabetes mellitus without complication, without long-term current use of insulin (HCC) -     Glucose (CBG) -     HgB A1c -     metFORMIN (GLUCOPHAGE) 1000 MG tablet; Take 1 tablet (1,000 mg total) by mouth 2 (two) times daily with a meal. -     Dulaglutide (TRULICITY) 9.51 OA/4.1YS SOPN; Inject 0.75 mg once weekly; may increase to 1.5 mg once weekly after 4 weeks if needed to achieve glycemic goals. Please wait to see pharmacist prior to increasing past 1.76m -     Insulin Pen Needle (B-D UF III MINI PEN NEEDLES) 31G X 5 MM MISC; Use as instructed. Inject into the skin once daily. Diabetes is poorly controlled. Advised patient to keep a fasting blood sugar log fast, 2 hours post lunch and bedtime which will be reviewed at the next office visit.  Hyperlipidemia LDL goal <70 INSTRUCTIONS: Work on a low fat, heart healthy diet and participate in regular aerobic exercise program by working out at least 150 minutes per week; 5 days a week-30 minutes per day. Avoid red meat, fried foods. junk foods, sodas, sugary drinks, unhealthy snacking, alcohol and smoking.  Drink at least 48oz of water per day and monitor your carbohydrate intake daily.   Essential hypertension -     CMP14+EGFR Continue all antihypertensives as prescribed.  Remember to bring in your blood pressure log with you for your follow up appointment.  DASH/Mediterranean Diets are healthier choices for HTN.    Anemia, unspecified type -     Ambulatory referral to Gastroenterology -     CBC    Patient has been counseled on age-appropriate routine health concerns for screening and prevention. These are reviewed and up-to-date. Referrals have been placed accordingly. Immunizations are up-to-date or declined.    Subjective:   Chief Complaint  Patient presents with  . Follow-up    Pt. is here for follow up on diabetes.    [name] presents to office today for follow up.  has a past medical history of Diabetes mellitus without complication ([place], History of kidney stones, Hyperlipidemia, Hypertension, Kidney stones, and Stomach ulcer.    Hypertension Jon [name] is not exercising and is adherent to low salt diet.  Jon [name] does not have a blood pressure log today. . Taking Losartan 50 mg, amlodipine 10 mg daily.  Blood pressure is well controlled at home. Denies chest pain, shortness of breath, palpitations, lightheadedness, dizziness, headaches or BLE edema.  BP Readings from Last 3 Encounters:  03/06/19 119/72  10/24/18 131/72  07/22/18 129/84    Hyperlipidemia Patient presents for follow up to hyperlipidemia.  Jon [name] is not medication compliant taking atorvastatin 40 mg daily. Jon [name] is not consistently diet compliant and denies  statin intolerance including myalgias. LDL not at goal.  Lab Results  Component Value Date   CHOL 164 06/10/2018   Lab Results  Component Value Date   HDL 39 (L) 06/10/2018   Lab Results  Component Value Date   LDLCALC 107 (H) 06/10/2018   Lab Results  Component Value Date   TRIG 88 06/10/2018   Lab Results  Component Value Date   CHOLHDL 4.2 06/10/2018      Diabetes Mellitus Type II Current symptoms/problems include none .  Diabetes is controlled.  Jon [name] denies any hypo or hyper glycemic symptoms.   Cardiovascular risk  factors: advanced age (older than 44 for men, 25 for women), diabetes mellitus, dyslipidemia, hypertension and [name] gender Current diabetic medications include: Glimepiride 4 mg daily, linagliptin-Metformin 2.09-998 mg twice daily.  Blood pressure is not well controlled today at this time we will add Trulicity, restart Metformin only and continue glimepiride. Eye exam current (within one year): yes Weight trend: stable Prior visit with dietician: no Current monitoring regimen: home blood tests -1 2 times daily Is Jon [name] on ACE inhibitor or  angiotensin II receptor blocker?  Yes  Lab Results  Component Value Date   HGBA1C 8.7 (A) 03/06/2019   HGBA1C 8.7 (A) 10/24/2018   HGBA1C 8.1 (A) 07/22/2018    Review of Systems  Constitutional: Negative for fever, malaise/fatigue and weight loss.  HENT: Negative.  Negative for nosebleeds.   Eyes: Negative.  Negative for blurred vision, double vision and photophobia.  Respiratory: Negative.  Negative for cough and shortness of breath.   Cardiovascular: Negative.  Negative for chest pain, palpitations and leg swelling.  Gastrointestinal: Negative.  Negative for heartburn, nausea and vomiting.  Musculoskeletal: Negative.  Negative for myalgias.  Neurological: Negative.  Negative for dizziness, focal weakness, seizures and headaches.  Psychiatric/Behavioral: Negative.  Negative for suicidal ideas.    Past Medical History:  Diagnosis Date  . Diabetes mellitus without complication ([place])    type II  . History of kidney stones   . Hyperlipidemia   . Hypertension   . Kidney stones   . Stomach ulcer     Past Surgical History:  Procedure Laterality Date  . CYSTOSCOPY Left 12/10/2016   Procedure: cystoscopy with left ureteral stone extraction ;  Surgeon: Kathie Rhodes, MD;  Location: [facility];  Service: Urology;  Laterality: Left;  . HERNIA REPAIR    . Stomach ulcer      Family History  Problem Relation Age of Onset  . Diabetes Father   . Stroke Father   . Hypertension Father   . Hypertension Mother   . Diabetes Sister   . Diabetes Son   . Diabetes Daughter     Social History Reviewed with no changes to be made today.   Outpatient Medications Prior to Visit  Medication Sig Dispense Refill  . amLODipine (NORVASC) 10 MG tablet TAKE 1 TABLET BY MOUTH  DAILY 90 tablet 3  . atorvastatin (LIPITOR) 40 MG tablet TAKE 1 TABLET BY MOUTH  DAILY 90 tablet 3  . Blood Glucose Monitoring Suppl (ONETOUCH VERIO) w/Device KIT Use to check blood sugar daily. 1 kit 0  . clopidogrel (PLAVIX)  75 MG tablet TAKE 1 TABLET BY MOUTH  DAILY 90 tablet 3  . glimepiride (AMARYL) 4 MG tablet TAKE 1 TABLET BY MOUTH  DAILY WITH BREAKFAST 90 tablet 3  . glucose blood (ONETOUCH VERIO) test strip Use as instructed to check blood sugar daily. 100 each 3  . losartan (COZAAR) 50 MG tablet Take 1 tablet (50 mg total) by mouth daily. 90 tablet 3  . OneTouch Delica Lancets 33A MISC Use as instructed to check blood sugar once daily. 100 each 3  . linaGLIPtin-metFORMIN HCl 2.09-998 MG TABS Take 1 tablet by mouth 2 (two) times daily with a meal. (Patient not taking: Reported on 03/06/2019) 180 tablet 1   No facility-administered medications prior to visit.     No Known Allergies     Objective:    BP 119/72 (BP Location: Left Arm, Patient Position: Sitting, Cuff Size: Normal)   Pulse 91   Temp 98 F (36.7  C) (Oral)   Ht 5' 10" (1.778 m)   Wt 203 lb (92.1 kg)   SpO2 97%   BMI 29.13 kg/m  Wt Readings from Last 3 Encounters:  03/06/19 203 lb (92.1 kg)  10/24/18 203 lb (92.1 kg)  07/22/18 203 lb (92.1 kg)    Physical Exam Vitals signs and nursing note reviewed.  Constitutional:      Appearance: Jon [name] is well-developed.  HENT:     Head: Normocephalic and atraumatic.  Neck:     Musculoskeletal: Normal range of motion.  Cardiovascular:     Rate and Rhythm: Normal rate and regular rhythm.     Heart sounds: Normal heart sounds. No murmur. No friction rub. No gallop.   Pulmonary:     Effort: Pulmonary effort is normal. No tachypnea or respiratory distress.     Breath sounds: Normal breath sounds. No decreased breath sounds, wheezing, rhonchi or rales.  Chest:     Chest wall: No tenderness.  Abdominal:     General: Bowel sounds are normal.     Palpations: Abdomen is soft.  Musculoskeletal: Normal range of motion.  Skin:    General: Skin is warm and dry.  Neurological:     Mental Status: Jon [name] is alert and oriented to person, place, and time.     Coordination: Coordination normal.   Psychiatric:        Behavior: Behavior normal. Behavior is cooperative.        Thought Content: Thought content normal.        Judgment: Judgment normal.        Patient has been counseled extensively about nutrition and exercise as well as the importance of adherence with medications and regular follow-up. The patient was given clear instructions to go to ER or return to medical center if symptoms don't improve, worsen or new problems develop. The patient verbalized understanding.   Follow-up: Return in about 4 weeks (around 04/03/2019) for [place] 4 weeks.   Gildardo Pounds, FNP-BC [facility] and [facility] [place], [place]   03/06/2019, 5:07 PM

## 2019-03-07 LAB — CMP14+EGFR
ALT: 21 IU/L (ref 0–44)
AST: 15 IU/L (ref 0–40)
Albumin/Globulin Ratio: 1.6 (ref 1.2–2.2)
Albumin: 4.7 g/dL (ref 3.8–4.8)
Alkaline Phosphatase: 76 IU/L (ref 39–117)
BUN/Creatinine Ratio: 18 (ref 10–24)
BUN: 17 mg/dL (ref 8–27)
Bilirubin Total: 0.5 mg/dL (ref 0.0–1.2)
CO2: 24 mmol/L (ref 20–29)
Calcium: 9.8 mg/dL (ref 8.6–10.2)
Chloride: 101 mmol/L (ref 96–106)
Creatinine, Ser: 0.96 mg/dL (ref 0.76–1.27)
GFR calc Af Amer: 95 mL/min/{1.73_m2} (ref >59)
GFR calc non Af Amer: 83 mL/min/{1.73_m2} (ref >59)
Globulin, Total: 3 g/dL (ref 1.5–4.5)
Glucose: 126 mg/dL — ABNORMAL HIGH (ref 65–99)
Potassium: 4.3 mmol/L (ref 3.5–5.2)
Sodium: 139 mmol/L (ref 134–144)
Total Protein: 7.7 g/dL (ref 6.0–8.5)

## 2019-03-07 LAB — CBC
Hematocrit: 41.9 % (ref 37.5–51.0)
Hemoglobin: 13.2 g/dL (ref 13.0–17.7)
MCH: 23.8 pg — ABNORMAL LOW (ref 26.6–33.0)
MCHC: 31.5 g/dL (ref 31.5–35.7)
MCV: 76 fL — ABNORMAL LOW (ref 79–97)
Platelets: 309 10*3/uL (ref 150–450)
RBC: 5.54 x10E6/uL (ref 4.14–5.80)
RDW: 15.9 % — ABNORMAL HIGH (ref 11.6–15.4)
WBC: 6.2 10*3/uL (ref 3.4–10.8)

## 2019-03-12 ENCOUNTER — Encounter: Payer: Self-pay | Admitting: Nurse Practitioner

## 2019-03-26 ENCOUNTER — Encounter: Payer: Self-pay | Admitting: Gastroenterology

## 2019-03-31 ENCOUNTER — Other Ambulatory Visit: Payer: Self-pay | Admitting: Nurse Practitioner

## 2019-03-31 DIAGNOSIS — I10 Essential (primary) hypertension: Secondary | ICD-10-CM

## 2019-04-03 ENCOUNTER — Ambulatory Visit: Payer: 59 | Admitting: Pharmacist

## 2019-04-14 ENCOUNTER — Other Ambulatory Visit: Payer: Self-pay | Admitting: Pharmacist

## 2019-04-14 DIAGNOSIS — E119 Type 2 diabetes mellitus without complications: Secondary | ICD-10-CM

## 2019-04-14 MED ORDER — TRULICITY 0.75 MG/0.5ML ~~LOC~~ SOAJ: SUBCUTANEOUS | 3 refills | Status: DC

## 2019-04-15 ENCOUNTER — Ambulatory Visit (INDEPENDENT_AMBULATORY_CARE_PROVIDER_SITE_OTHER): Payer: 59 | Admitting: Gastroenterology

## 2019-04-15 ENCOUNTER — Other Ambulatory Visit: Payer: Self-pay

## 2019-04-15 ENCOUNTER — Encounter: Payer: Self-pay | Admitting: Gastroenterology

## 2019-04-15 VITALS — BP 140/78 | HR 68 | Temp 98.1°F | Ht 70.0 in | Wt 203.8 lb

## 2019-04-15 DIAGNOSIS — D509 Iron deficiency anemia, unspecified: Secondary | ICD-10-CM

## 2019-04-15 NOTE — Progress Notes (Signed)
[name] Consult Note:  History: Jon [name] 04/15/2019  Referring provider: Gildardo Pounds, NP  Reason for consult/chief complaint: Anemia (Abnormal labs)   Subjective  HPI: Referred by primary care for mild anemia with hemoglobin at 12.6.  No prior screening colonoscopy.  Jon [name] feels well, has no chronic digestive symptoms except for some intermittent bloating and gassiness.  He denies nausea, vomiting, early satiety, dysphagia, anorexia or weight loss.  Bowel habits are regular without black or bloody stool.  Reports a normal colonoscopy in [place] in 2014, and was told to have another in 10 years.   ROS:  Review of Systems He denies chest pain dyspnea or dysuria Intermittent return of mild dysarthria if he is fatigued.  Otherwise he has had complete neurologic recovery from his CVA and is able to work full-time Oncologist for [employer].  Past Medical History: Past Medical History:  Diagnosis Date  . Diabetes mellitus without complication ([place])    type II  . History of kidney stones   . Hyperlipidemia   . Hypertension   . Kidney stones   . Stomach ulcer      Past Surgical History: Past Surgical History:  Procedure Laterality Date  . CYSTOSCOPY Left 12/10/2016   Procedure: cystoscopy with left ureteral stone extraction ;  Surgeon: Kathie Rhodes, MD;  Location: [facility];  Service: Urology;  Laterality: Left;  . HERNIA REPAIR    . Stomach ulcer      CVA December 2018 with the following extracted from the discharge summary: Hospital Course:  CVA "- MRI: Acute lacunar infarct in the posterior limb left internal capsule/lateral thalamus. - CTA head and neck shows atherosclerosis and some stenosis- see report below - LDL: 83- Lipitor dose should be increased to get LDL < 70  - A1c: pending - PT eval>> no PT follow up recommended - OT eval>> therapeutic exercises given to improve coordination in right hand- no other therapy  needed- I have spoken with the therapist just now about this - ASA is being changed to Plavix and Lipitor is being increased"     Family History: Family History  Problem Relation Age of Onset  . Diabetes Father   . Stroke Father   . Hypertension Father   . Hypertension Mother   . Diabetes Sister   . Diabetes Son   . Diabetes Daughter     Social History: Social History   Socioeconomic History  . Marital status: Divorced    Spouse name: Not on file  . Number of children: 4  . Years of education: 32  . Highest education level: Not on file  Occupational History  . Occupation: Unemployed  Social Needs  . Financial resource strain: Not on file  . Food insecurity    Worry: Not on file    Inability: Not on file  . Transportation needs    Medical: Not on file    Non-medical: Not on file  Tobacco Use  . Smoking status: Never Smoker  . Smokeless tobacco: Never Used  Substance and Sexual Activity  . Alcohol use: Yes    Alcohol/week: 1.0 - 2.0 standard drinks    Types: 1 - 2 Glasses of wine per week    Comment: socially  . Drug use: No  . Sexual activity: Not Currently  Lifestyle  . Physical activity    Days per week: Not on file    Minutes per session: Not on file  . Stress: Not on file  Relationships  .  Social Herbalist on phone: Not on file    Gets together: Not on file    Attends religious service: Not on file    Active member of club or organization: Not on file    Attends meetings of clubs or organizations: Not on file    Relationship status: Not on file  Other Topics Concern  . Not on file  Social History Narrative   Fun: Politics    Allergies: No Known Allergies  Outpatient Meds: Current Outpatient Medications  Medication Sig Dispense Refill  . amLODipine (NORVASC) 10 MG tablet TAKE 1 TABLET BY MOUTH  DAILY 90 tablet 3  . atorvastatin (LIPITOR) 40 MG tablet TAKE 1 TABLET BY MOUTH  DAILY 90 tablet 3  . Blood Glucose Monitoring Suppl  (ONETOUCH VERIO) w/Device KIT Use to check blood sugar daily. 1 kit 0  . clopidogrel (PLAVIX) 75 MG tablet TAKE 1 TABLET BY MOUTH  DAILY 90 tablet 3  . Dulaglutide (TRULICITY) 3.54 SF/6.8LE SOPN Inject 0.75 mg once weekly. 2 mL 3  . glimepiride (AMARYL) 4 MG tablet TAKE 1 TABLET BY MOUTH  DAILY WITH BREAKFAST 90 tablet 3  . glucose blood (ONETOUCH VERIO) test strip Use as instructed to check blood sugar daily. 100 each 3  . Insulin Pen Needle (B-D UF III MINI PEN NEEDLES) 31G X 5 MM MISC Use as instructed. Inject into the skin once daily. 100 each 3  . losartan (COZAAR) 50 MG tablet TAKE 1 TABLET BY MOUTH  DAILY 90 tablet 1  . metFORMIN (GLUCOPHAGE) 1000 MG tablet Take 1 tablet (1,000 mg total) by mouth 2 (two) times daily with a meal. 180 tablet 1  . OneTouch Delica Lancets 75T MISC Use as instructed to check blood sugar once daily. 100 each 3   No current facility-administered medications for this visit.       ___________________________________________________________________ Objective   Exam:  BP 140/78   Pulse 68   Temp 98.1 F (36.7 C)   Ht '5\' 10"'$  (1.778 m)   Wt 203 lb 12.8 oz (92.4 kg)   BMI 29.24 kg/m    General: Well-appearing, pleasant and conversational.  Eyes: sclera anicteric, no redness  ENT: oral mucosa moist without lesions, no cervical or supraclavicular lymphadenopathy  CV: RRR without murmur, S1/S2, no JVD, no peripheral edema  Resp: clear to auscultation bilaterally, normal RR and effort noted  GI: soft, no tenderness, with active bowel sounds. No guarding or palpable organomegaly noted.  Skin; warm and dry, no rash or jaundice noted  Neuro: awake, alert and oriented x 3. Normal gross motor function and fluent speech  Labs:  CBC Latest Ref Rng & Units 03/06/2019 10/24/2018 08/05/2017  WBC 3.4 - 10.8 x10E3/uL 6.2 5.8 5.6  Hemoglobin 13.0 - 17.7 g/dL 13.2 12.6(L) 12.8(L)  Hematocrit 37.5 - 51.0 % 41.9 39.5 40.9  Platelets 150 - 450 x10E3/uL 309 272  278   His MCV always runs in the mid to high 70s, and this is his typical range of hemoglobin except for a brief period when it was 11.7 in December 2018  CMP Latest Ref Rng & Units 03/06/2019 10/24/2018 08/05/2017  Glucose 65 - 99 mg/dL 126(H) 189(H) 308(H)  BUN 8 - 27 mg/dL '17 14 16  '$ Creatinine 0.76 - 1.27 mg/dL 0.96 1.06 0.87  Sodium 134 - 144 mmol/L 139 140 135  Potassium 3.5 - 5.2 mmol/L 4.3 4.3 4.0  Chloride 96 - 106 mmol/L 101 101 105  CO2 20 -  29 mmol/L 24 24 20(L)  Calcium 8.6 - 10.2 mg/dL 9.8 9.8 8.9  Total Protein 6.0 - 8.5 g/dL 7.7 - -  Total Bilirubin 0.0 - 1.2 mg/dL 0.5 - -  Alkaline Phos 39 - 117 IU/L 76 - -  AST 0 - 40 IU/L 15 - -  ALT 0 - 44 IU/L 21 - -     Assessment: Encounter Diagnosis  Name Primary?  . Microcytic anemia Yes   His hemoglobin has occasionally been slightly below this labs normal level of 13, which I think is just normal for him.  It was briefly down when he was hospitalized for a CVA. He has no localizing GI symptoms, and I do not think he has chronic occult blood loss. Plan:  Screening colonoscopy in January 2024, he was put in for recall with our practice.  Thank you for the courtesy of this consult.  Please call me with any questions or concerns.  Nelida Meuse III  CC: Referring provider noted above

## 2019-04-15 NOTE — Patient Instructions (Addendum)
If you are age 65 or older, your body mass index should be between 23-30. Your Body mass index is 29.24 kg/m. If this is out of the aforementioned range listed, please consider follow up with your Primary Care Provider.  If you are age 6 or younger, your body mass index should be between 19-25. Your Body mass index is 29.24 kg/m. If this is out of the aformentioned range listed, please consider follow up with your Primary Care Provider.   You will be due for a recall colonoscopy in January 2024. We will send you a reminder in the mail when it gets closer to that time.  It was a pleasure to see you today!  Dr. Loletha Carrow  ___________________________________________________________________    Food Guidelines for gas and bloating  Many people have difficulty digesting certain foods, causing a variety of distressing and embarrassing symptoms such as abdominal pain, bloating and gas.  These foods may need to be avoided or consumed in small amounts.  Here are some tips that might be helpful for you.  1.   Lactose intolerance is the difficulty or complete inability to digest lactose, the natural sugar in milk and anything made from milk.  This condition is harmless, common, and can begin any time during life.  Some people can digest a modest amount of lactose while others cannot tolerate any.  Also, not all dairy products contain equal amounts of lactose.  For example, hard cheeses such as parmesan have less lactose than soft cheeses such as cheddar.  Yogurt has less lactose than milk or cheese.  Many packaged foods (even many brands of bread) have milk, so read ingredient lists carefully.  It is difficult to test for lactose intolerance, so just try avoiding lactose as much as possible for a week and see what happens with your symptoms.  If you seem to be lactose intolerant, the best plan is to avoid it (but make sure you get calcium from another source).  The next best thing is to use lactase enzyme  supplements, available over the counter everywhere.  Just know that many lactose intolerant people need to take several tablets with each serving of dairy to avoid symptoms.  Lastly, a lot of restaurant food is made with milk or butter.  Many are things you might not suspect, such as mashed potatoes, rice and pasta (cooked with butter) and "grilled" items.  If you are lactose intolerant, it never hurts to ask your server what has milk or butter.  2.   Fiber is an important part of your diet, but not all fiber is well-tolerated.  Insoluble fiber such as bran is often consumed by normal gut bacteria and converted into gas.  Soluble fiber such as oats, squash, carrots and green beans are typically tolerated better.  3.   Some types of carbohydrates can be poorly digested.  Examples include: fructose (apples, cherries, pears, raisins and other dried fruits), fructans (onions, zucchini, large amounts of wheat), sorbitol/mannitol/xylitol and sucralose/Splenda (common artificial sweeteners), and raffinose (lentils, broccoli, cabbage, asparagus, brussel sprouts, many types of beans).  Do a Development worker, community for [employer] and you will find helpful information. Beano, a dietary supplement, will often help with raffinose-containing foods.  As with lactase tablets, you may need several per serving.  4.   Whenever possible, avoid processed food&meats and chemical additives.  High fructose corn syrup, a common sweetener, may be difficult to digest.  Eggs and soy (comes from the soybean, and added to many foods  now) are other common bloating/gassy foods.  - Dr. [facility]

## 2019-04-23 ENCOUNTER — Other Ambulatory Visit: Payer: Self-pay | Admitting: Nurse Practitioner

## 2019-04-23 ENCOUNTER — Encounter: Payer: Self-pay | Admitting: Nurse Practitioner

## 2019-04-23 DIAGNOSIS — E119 Type 2 diabetes mellitus without complications: Secondary | ICD-10-CM

## 2019-04-23 MED ORDER — TRULICITY 0.75 MG/0.5ML ~~LOC~~ SOAJ: SUBCUTANEOUS | 3 refills | Status: DC

## 2019-04-24 MED FILL — TRULICITY 0.75 MG/0.5 ML PE: 0.75 | 30 days supply | Qty: 2 | Fill #0

## 2019-05-28 ENCOUNTER — Encounter: Payer: Self-pay | Admitting: Nurse Practitioner

## 2019-05-29 ENCOUNTER — Other Ambulatory Visit: Payer: Self-pay | Admitting: Nurse Practitioner

## 2019-05-31 ENCOUNTER — Other Ambulatory Visit: Payer: Self-pay | Admitting: Nurse Practitioner

## 2019-05-31 MED ORDER — FREESTYLE LIBRE 2 SENSOR SYSTM MISC: 2.0000 | Freq: Three times a day (TID) | 99 refills | Status: AC

## 2019-05-31 MED ORDER — FREESTYLE LIBRE 2 READER SYSTM DEVI: 1.0000 | Freq: Three times a day (TID) | 0 refills | Status: DC

## 2019-06-06 ENCOUNTER — Other Ambulatory Visit: Payer: Self-pay | Admitting: Family Medicine

## 2019-06-06 DIAGNOSIS — E119 Type 2 diabetes mellitus without complications: Secondary | ICD-10-CM

## 2019-06-07 MED ORDER — FREESTYLE LIBRE 14 DAY READER DEVI: 1.0000 | Freq: Every day | 99 refills | Status: DC

## 2019-06-07 MED ORDER — FREESTYLE LIBRE 14 DAY SENSOR MISC: 1.0000 | Freq: Every day | 99 refills | Status: DC

## 2019-06-10 ENCOUNTER — Encounter: Payer: Self-pay | Admitting: Nurse Practitioner

## 2019-06-30 ENCOUNTER — Telehealth: Payer: Self-pay

## 2019-06-30 NOTE — Telephone Encounter (Signed)
[facility] following up on a fax that was sent for patient

## 2019-07-03 NOTE — Telephone Encounter (Signed)
I have not received any faxes from [facility]

## 2019-07-28 ENCOUNTER — Other Ambulatory Visit: Payer: Self-pay | Admitting: Family Medicine

## 2019-07-28 DIAGNOSIS — I10 Essential (primary) hypertension: Secondary | ICD-10-CM

## 2019-07-28 MED FILL — ONE TOUCH VERIO TEST STRIP: 31 days supply | Qty: 50 | Fill #1

## 2019-08-03 ENCOUNTER — Ambulatory Visit: Payer: 59 | Attending: Nurse Practitioner | Admitting: Pharmacist

## 2019-08-03 ENCOUNTER — Other Ambulatory Visit: Payer: 59 | Admitting: Pharmacist

## 2019-08-03 ENCOUNTER — Other Ambulatory Visit: Payer: Self-pay

## 2019-08-03 DIAGNOSIS — E1165 Type 2 diabetes mellitus with hyperglycemia: Secondary | ICD-10-CM

## 2019-08-03 DIAGNOSIS — E119 Type 2 diabetes mellitus without complications: Secondary | ICD-10-CM

## 2019-08-03 DIAGNOSIS — I10 Essential (primary) hypertension: Secondary | ICD-10-CM

## 2019-08-03 LAB — POCT GLYCOSYLATED HEMOGLOBIN (HGB A1C): Hemoglobin A1C: 7.1 % — AB (ref 4.0–5.6)

## 2019-08-03 MED ORDER — TRULICITY 0.75 MG/0.5ML ~~LOC~~ SOAJ: SUBCUTANEOUS | 0 refills | Status: DC

## 2019-08-03 MED ORDER — METFORMIN HCL 1000 MG PO TABS: 1000.0000 mg | ORAL_TABLET | Freq: Two times a day (BID) | ORAL | 1 refills | Status: DC

## 2019-08-03 NOTE — Progress Notes (Signed)
    S:    PCP: Zelda   No chief complaint on file.  Patient arrives in good spirits.  Presents for diabetes evaluation, education, and management. Patient was referred and last seen by Primary Care Provider on 03/06/19.   Family/Social History:  - FHx: DM (father, sister, daughter, son), HTN (mother, father) - Tobacco: never smoker - Alcohol: socially   Human resources officer affordability: [place]  Patient reports adherence with medications.  Current diabetes medications include: glimepiride 4 mg daily, metformin [id number] mg BID, Trulicity [id number] mg weekly Current hypertension medications include: amlodipine 10 mg daily, losartan 50 mg daily Current hyperlipidemia medications include: atorvastatin 40 mg daily  Patient denies hypoglycemic events.  Patient reported dietary habits:  - Reports doing a better job with decreasing carbs - Denies eating sweets or drinking sweetened beverages  Patient-reported exercise habits:  - Limited    Patient denies nocturia.  Patient denies neuropathy. Patient denies visual changes. Patient reports self foot exams.    O:  POCT glucose: 125 this AM (fasting)  Home fasting CBG: 110s - 120s  2 hour post-prandial/random CBG: 110s - 147  Lab Results  Component Value Date   HGBA1C 7.1 (A) 08/03/2019   There were no vitals filed for this visit.  Lipid Panel     Component Value Date/Time   CHOL 164 06/10/2018 1138   TRIG 88 06/10/2018 1138   HDL 39 (L) 06/10/2018 1138   CHOLHDL 4.2 06/10/2018 1138   CHOLHDL 4.4 04/27/2017 0531   VLDL 22 04/27/2017 0531   LDLCALC 107 (H) 06/10/2018 1138   Clinical ASCVD: Yes  The ASCVD Risk score Mikey Bussing DC Jr., et al., 2013) failed to calculate for the following reasons:   The patient has a prior MI or stroke diagnosis   A/P: Diabetes longstanding currently close to goal. A1c down to 7.1 today. Home sugars have improved. Patient is able to verbalize appropriate hypoglycemia management  plan. Patient is adherent with medication.   -Continued current regimen.  -Extensively discussed pathophysiology of DM, recommended lifestyle interventions, dietary effects on glycemic control -Counseled on s/sx of and management of hypoglycemia -A1c, POCT -Glucose, POCT  ASCVD risk - secondary prevention in patient with DM. Need updated LDL. Will order for patient to obtain at f/u PCP clinic visit. -Continue atorvastatin  -Lipid, future  HM:  - Pt is due PNA vaccine  - Will defer to PCP.  Written patient instructions provided. Total time in face to face counseling 15 minutes.   Follow up with PCP.  Benard Halsted, PharmD, [place] [phone]

## 2019-08-11 ENCOUNTER — Other Ambulatory Visit: Payer: Self-pay

## 2019-08-11 ENCOUNTER — Ambulatory Visit: Payer: 59

## 2019-08-11 ENCOUNTER — Ambulatory Visit: Payer: 59 | Attending: Nurse Practitioner | Admitting: Physician Assistant

## 2019-08-11 VITALS — BP 124/82 | HR 95 | Temp 98.2°F | Resp 16 | Wt 200.2 lb

## 2019-08-11 DIAGNOSIS — M5442 Lumbago with sciatica, left side: Secondary | ICD-10-CM

## 2019-08-11 DIAGNOSIS — M5441 Lumbago with sciatica, right side: Secondary | ICD-10-CM

## 2019-08-11 MED ORDER — METHYLPREDNISOLONE SODIUM SUCC 125 MG IJ SOLR
125.0000 mg | Freq: Once | INTRAMUSCULAR | Status: AC
  Administered 2019-08-11: 125 mg via INTRAMUSCULAR

## 2019-08-11 MED ORDER — KETOROLAC TROMETHAMINE 60 MG/2ML IM SOLN
60.0000 mg | Freq: Once | INTRAMUSCULAR | Status: AC
  Administered 2019-08-11: 60 mg via INTRAMUSCULAR

## 2019-08-11 NOTE — Progress Notes (Signed)
Acute Office Visit  Subjective:    Patient ID: Jon [name], male    DOB: [birth year], 64 y.o.   MRN: [id number]  Chief Complaint  Patient presents with  . Back Pain    HPI Patient is in today for acute back pain.  Reports it started approximately 5 days ago, started getting better over the weekend, but became worse again on Sunday.  Reports the pain as lower back pain, radiating to both the right leg and the left leg but not both at the same time.  Reports his pain level 8 out of 10.  Reports that he has been using Motrin 400 mg twice a day, heating pad, topical pain cream without much relief.  Denies any previous low back pain history, denies trauma or injury.  Denies any urinary discomfort, burning, fever, numbness or tingling.  Reports that he does check his blood pressure at home, has been within normal limits,  Past Medical History:  Diagnosis Date  . Diabetes mellitus without complication ([place])    type II  . History of kidney stones   . Hyperlipidemia   . Hypertension   . Kidney stones   . Stomach ulcer     Past Surgical History:  Procedure Laterality Date  . CYSTOSCOPY Left 12/10/2016   Procedure: cystoscopy with left ureteral stone extraction ;  Surgeon: Kathie Rhodes, MD;  Location: [facility];  Service: Urology;  Laterality: Left;  . HERNIA REPAIR    . Stomach ulcer      Family History  Problem Relation Age of Onset  . Diabetes Father   . Stroke Father   . Hypertension Father   . Hypertension Mother   . Diabetes Sister   . Diabetes Son   . Diabetes Daughter     Social History   Socioeconomic History  . Marital status: Divorced    Spouse name: Not on file  . Number of children: 4  . Years of education: 69  . Highest education level: Not on file  Occupational History  . Occupation: Unemployed  Tobacco Use  . Smoking status: Never Smoker  . Smokeless tobacco: Never Used  Substance and Sexual Activity  . Alcohol use: Yes    Alcohol/week: 1.0 - 2.0  standard drinks    Types: 1 - 2 Glasses of wine per week    Comment: socially  . Drug use: No  . Sexual activity: Not Currently  Other Topics Concern  . Not on file  Social History Narrative   Fun: Office manager   Social Determinants of Health   Financial Resource Strain:   . Difficulty of Paying Living Expenses:   Food Insecurity:   . Worried About Charity fundraiser in the Last Year:   . Arboriculturist in the Last Year:   Transportation Needs:   . Film/video editor (Medical):   Marland Kitchen Lack of Transportation (Non-Medical):   Physical Activity:   . Days of Exercise per Week:   . Minutes of Exercise per Session:   Stress:   . Feeling of Stress :   Social Connections:   . Frequency of Communication with Friends and Family:   . Frequency of Social Gatherings with Friends and Family:   . Attends Religious Services:   . Active Member of Clubs or Organizations:   . Attends Archivist Meetings:   Marland Kitchen Marital Status:   Intimate Partner Violence:   . Fear of Current or Ex-Partner:   . Emotionally Abused:   .  Physically Abused:   . Sexually Abused:     Outpatient Medications Prior to Visit  Medication Sig Dispense Refill  . amLODipine (NORVASC) 10 MG tablet TAKE 1 TABLET BY MOUTH  DAILY 90 tablet 3  . atorvastatin (LIPITOR) 40 MG tablet TAKE 1 TABLET BY MOUTH  DAILY (Patient not taking: Reported on 08/11/2019) 90 tablet 3  . Blood Glucose Monitoring Suppl (ONETOUCH VERIO) w/Device KIT Use to check blood sugar daily. 1 kit 0  . clopidogrel (PLAVIX) 75 MG tablet TAKE 1 TABLET BY MOUTH  DAILY 90 tablet 3  . Continuous Blood Gluc Receiver (FREESTYLE LIBRE 14 DAY READER) DEVI 1 Device by Does not apply route daily. 1 each PRN  . Continuous Blood Gluc Receiver (FREESTYLE LIBRE 2 READER SYSTM) DEVI 1 Device by Does not apply route 3 (three) times daily. E11.65 1 each 0  . Continuous Blood Gluc Sensor (FREESTYLE LIBRE 14 DAY SENSOR) MISC 1 Dose by Does not apply route daily. 1 each  PRN  . Dulaglutide (TRULICITY) 8.24 MP/5.3IR SOPN INJECT SUBCUTANEOUSLY 0.75  MG ONCE WEEKLY 6 mL 0  . glimepiride (AMARYL) 4 MG tablet TAKE 1 TABLET BY MOUTH  DAILY WITH BREAKFAST 90 tablet 3  . glucose blood (ONETOUCH VERIO) test strip Use as instructed to check blood sugar daily. 100 each 3  . Insulin Pen Needle (B-D UF III MINI PEN NEEDLES) 31G X 5 MM MISC Use as instructed. Inject into the skin once daily. 100 each 3  . losartan (COZAAR) 50 MG tablet TAKE 1 TABLET BY MOUTH  DAILY 90 tablet 0  . metFORMIN (GLUCOPHAGE) 1000 MG tablet Take 1 tablet (1,000 mg total) by mouth 2 (two) times daily with a meal. 180 tablet 1  . OneTouch Delica Lancets 44R MISC Use as instructed to check blood sugar once daily. 100 each 3   No facility-administered medications prior to visit.    No Known Allergies  Review of Systems  Constitutional: Negative for chills and fever.  HENT: Negative.   Eyes: Negative.   Respiratory: Negative.   Cardiovascular: Negative.   Gastrointestinal: Negative.  Negative for diarrhea, nausea and vomiting.  Endocrine: Negative.   Genitourinary: Negative for difficulty urinating and hematuria.  Musculoskeletal: Positive for arthralgias, back pain and myalgias.  Skin: Negative.   Allergic/Immunologic: Negative.   Neurological: Negative.   Hematological: Negative.   Psychiatric/Behavioral: Negative.        Objective:    Physical Exam Vitals and nursing note reviewed.  Constitutional:      General: He is not in acute distress.    Appearance: Normal appearance. He is not diaphoretic.  HENT:     Head: Normocephalic and atraumatic.     Right Ear: External ear normal.     Left Ear: External ear normal.     Nose: Nose normal.     Mouth/Throat:     Mouth: Mucous membranes are dry.     Pharynx: Oropharynx is clear.  Eyes:     Extraocular Movements: Extraocular movements intact.     Conjunctiva/sclera: Conjunctivae normal.     Pupils: Pupils are equal, round, and  reactive to light.  Cardiovascular:     Rate and Rhythm: Normal rate and regular rhythm.     Pulses: Normal pulses.     Heart sounds: Normal heart sounds.  Pulmonary:     Effort: Pulmonary effort is normal.     Breath sounds: Normal breath sounds.  Abdominal:     General: Abdomen is flat. Bowel sounds are  normal.     Palpations: Abdomen is soft.  Musculoskeletal:     Cervical back: Normal, normal range of motion and neck supple.     Thoracic back: Normal.     Lumbar back: Tenderness present. No swelling, deformity or spasms.     Comments: decreased ROM, pain elicited with testing  Skin:    General: Skin is warm and dry.  Neurological:     General: No focal deficit present.     Mental Status: He is alert and oriented to person, place, and time.  Psychiatric:        Mood and Affect: Mood normal.        Behavior: Behavior normal.        Thought Content: Thought content normal.        Judgment: Judgment normal.     BP (!) 165/91   Pulse 95   Temp 98.2 F (36.8 C)   Resp 16   Wt 200 lb 3.2 oz (90.8 kg)   SpO2 96%   BMI 28.73 kg/m  Wt Readings from Last 3 Encounters:  08/11/19 200 lb 3.2 oz (90.8 kg)  04/15/19 203 lb 12.8 oz (92.4 kg)  03/06/19 203 lb (92.1 kg)    Health Maintenance Due  Topic Date Due  . PNA vac Low Risk Adult (1 of 2 - PCV13) Never done  . OPHTHALMOLOGY EXAM  04/25/2019    There are no preventive care reminders to display for this patient.   Lab Results  Component Value Date   TSH 1.604 04/27/2017   Lab Results  Component Value Date   WBC 6.2 03/06/2019   HGB 13.2 03/06/2019   HCT 41.9 03/06/2019   MCV 76 (L) 03/06/2019   PLT 309 03/06/2019   Lab Results  Component Value Date   NA 139 03/06/2019   K 4.3 03/06/2019   CO2 24 03/06/2019   GLUCOSE 126 (H) 03/06/2019   BUN 17 03/06/2019   CREATININE 0.96 03/06/2019   BILITOT 0.5 03/06/2019   ALKPHOS 76 03/06/2019   AST 15 03/06/2019   ALT 21 03/06/2019   PROT 7.7 03/06/2019    ALBUMIN 4.7 03/06/2019   CALCIUM 9.8 03/06/2019   ANIONGAP 10 08/05/2017   GFR 95.90 03/13/2017   Lab Results  Component Value Date   CHOL 164 06/10/2018   Lab Results  Component Value Date   HDL 39 (L) 06/10/2018   Lab Results  Component Value Date   LDLCALC 107 (H) 06/10/2018   Lab Results  Component Value Date   TRIG 88 06/10/2018   Lab Results  Component Value Date   CHOLHDL 4.2 06/10/2018   Lab Results  Component Value Date   HGBA1C 7.1 (A) 08/03/2019       Assessment & Plan:   Problem List Items Addressed This Visit    None    Visit Diagnoses    Acute bilateral low back pain with bilateral sciatica    -  Primary   Relevant Medications   ketorolac (TORADOL) injection 60 mg   methylPREDNISolone sodium succinate (SOLU-MEDROL) 125 mg/2 mL injection 125 mg     1. Acute bilateral low back pain with bilateral sciatica Visit was started earlier today as a virtual telephone visit, patient was encouraged to come in to the office for physical exam, injections. Gave patient education on supportive care, stretching exercises.  - ketorolac (TORADOL) injection 60 mg - methylPREDNISolone sodium succinate (SOLU-MEDROL) 125 mg/2 mL injection 125 mg   Meds ordered this encounter  Medications  . ketorolac (TORADOL) injection 60 mg  . methylPREDNISolone sodium succinate (SOLU-MEDROL) 125 mg/2 mL injection 125 mg    I have reviewed the patient's medical history (PMH, PSH, Social History, Family History, Medications, and allergies) , and have been updated if relevant. I spent 30 minutes reviewing chart and  face to face time with patient.     Loraine Grip Mayers, PA-C

## 2019-08-11 NOTE — Patient Instructions (Signed)
I have enclosed information about the stretching that we discussed.  Please discontinue Motrin at this time.  I would give the topical pain cream another try, Voltaren can be purchased over-the-counter now and can be very effective.  Alternate between heat and ice.   Sciatica Rehab Ask your health care provider which exercises are safe for you. Do exercises exactly as told by your health care provider and adjust them as directed. It is normal to feel mild stretching, pulling, tightness, or discomfort as you do these exercises. Stop right away if you feel sudden pain or your pain gets worse. Do not begin these exercises until told by your health care provider. Stretching and range-of-motion exercises These exercises warm up your muscles and joints and improve the movement and flexibility of your hips and back. These exercises also help to relieve pain, numbness, and tingling. Sciatic nerve glide 1. Sit in a chair with your head facing down toward your chest. Place your hands behind your back. Let your shoulders slump forward. 2. Slowly straighten one of your legs while you tilt your head back as if you are looking toward the ceiling. Only straighten your leg as far as you can without making your symptoms worse. 3. Hold this position for __________ seconds. 4. Slowly return to the starting position. 5. Repeat with your other leg. Repeat __________ times. Complete this exercise __________ times a day. Knee to chest with hip adduction and internal rotation  1. Lie on your back on a firm surface with both legs straight. 2. Bend one of your knees and move it up toward your chest until you feel a gentle stretch in your lower back and buttock. Then, move your knee toward the shoulder that is on the opposite side from your leg. This is hip adduction and internal rotation. ? Hold your leg in this position by holding on to the front of your knee. 3. Hold this position for __________ seconds. 4. Slowly  return to the starting position. 5. Repeat with your other leg. Repeat __________ times. Complete this exercise __________ times a day. Prone extension on elbows  1. Lie on your abdomen on a firm surface. A bed may be too soft for this exercise. 2. Prop yourself up on your elbows. 3. Use your arms to help lift your chest up until you feel a gentle stretch in your abdomen and your lower back. ? This will place some of your body weight on your elbows. If this is uncomfortable, try stacking pillows under your chest. ? Your hips should stay down, against the surface that you are lying on. Keep your hip and back muscles relaxed. 4. Hold this position for __________ seconds. 5. Slowly relax your upper body and return to the starting position. Repeat __________ times. Complete this exercise __________ times a day. Strengthening exercises These exercises build strength and endurance in your back. Endurance is the ability to use your muscles for a long time, even after they get tired. Pelvic tilt This exercise strengthens the muscles that lie deep in the abdomen. 1. Lie on your back on a firm surface. Bend your knees and keep your feet flat on the floor. 2. Tense your abdominal muscles. Tip your pelvis up toward the ceiling and flatten your lower back into the floor. ? To help with this exercise, you may place a small towel under your lower back and try to push your back into the towel. 3. Hold this position for __________ seconds. 4. Let your muscles  relax completely before you repeat this exercise. Repeat __________ times. Complete this exercise __________ times a day. Alternating arm and leg raises  1. Get on your hands and knees on a firm surface. If you are on a hard floor, you may want to use padding, such as an exercise mat, to cushion your knees. 2. Line up your arms and legs. Your hands should be directly below your shoulders, and your knees should be directly below your hips. 3. Lift your  left leg behind you. At the same time, raise your right arm and straighten it in front of you. ? Do not lift your leg higher than your hip. ? Do not lift your arm higher than your shoulder. ? Keep your abdominal and back muscles tight. ? Keep your hips facing the ground. ? Do not arch your back. ? Keep your balance carefully, and do not hold your breath. 4. Hold this position for __________ seconds. 5. Slowly return to the starting position. 6. Repeat with your right leg and your left arm. Repeat __________ times. Complete this exercise __________ times a day. Posture and body mechanics Good posture and healthy body mechanics can help to relieve stress in your body's tissues and joints. Body mechanics refers to the movements and positions of your body while you do your daily activities. Posture is part of body mechanics. Good posture means:  Your spine is in its natural S-curve position (neutral).  Your shoulders are pulled back slightly.  Your head is not tipped forward. Follow these guidelines to improve your posture and body mechanics in your everyday activities. Standing   When standing, keep your spine neutral and your feet about hip width apart. Keep a slight bend in your knees. Your ears, shoulders, and hips should line up.  When you do a task in which you stand in one place for a long time, place one foot up on a stable object that is 2-4 inches (5-10 cm) high, such as a footstool. This helps keep your spine neutral. Sitting   When sitting, keep your spine neutral and keep your feet flat on the floor. Use a footrest, if necessary, and keep your thighs parallel to the floor. Avoid rounding your shoulders, and avoid tilting your head forward.  When working at a desk or a computer, keep your desk at a height where your hands are slightly lower than your elbows. Slide your chair under your desk so you are close enough to maintain good posture.  When working at a computer, place  your monitor at a height where you are looking straight ahead and you do not have to tilt your head forward or downward to look at the screen. Resting  When lying down and resting, avoid positions that are most painful for you.  If you have pain with activities such as sitting, bending, stooping, or squatting, lie in a position in which your body does not bend very much. For example, avoid curling up on your side with your arms and knees near your chest (fetal position).  If you have pain with activities such as standing for a long time or reaching with your arms, lie with your spine in a neutral position and bend your knees slightly. Try the following positions: ? Lying on your side with a pillow between your knees. ? Lying on your back with a pillow under your knees. Lifting   When lifting objects, keep your feet at least shoulder width apart and tighten your abdominal muscles.  Bend your knees and hips and keep your spine neutral. It is important to lift using the strength of your legs, not your back. Do not lock your knees straight out.  Always ask for help to lift heavy or awkward objects. This information is not intended to replace advice given to you by your health care provider. Make sure you discuss any questions you have with your health care provider. Document Revised: 08/22/2018 Document Reviewed: 05/22/2018 Elsevier Patient Education  [place].  Sciatica  Sciatica is pain, numbness, weakness, or tingling along the path of the sciatic nerve. The sciatic nerve starts in the lower back and runs down the back of each leg. The nerve controls the muscles in the lower leg and in the back of the knee. It also provides feeling (sensation) to the back of the thigh, the lower leg, and the sole of the foot. Sciatica is a symptom of another medical condition that pinches or puts pressure on the sciatic nerve. Sciatica most often only affects one side of the body. Sciatica usually  goes away on its own or with treatment. In some cases, sciatica may come back (recur). What are the causes? This condition is caused by pressure on the sciatic nerve or pinching of the nerve. This may be the result of:  A disk in between the bones of the spine bulging out too far (herniated disk).  Age-related changes in the spinal disks.  A pain disorder that affects a muscle in the buttock.  Extra bone growth near the sciatic nerve.  A break (fracture) of the pelvis.  Pregnancy.  Tumor. This is rare. What increases the risk? The following factors may make you more likely to develop this condition:  Playing sports that place pressure or stress on the spine.  Having poor strength and flexibility.  A history of back injury or surgery.  Sitting for long periods of time.  Doing activities that involve repetitive bending or lifting.  Obesity. What are the signs or symptoms? Symptoms can vary from mild to very severe, and they may include:  Any of these problems in the lower back, leg, hip, or buttock: ? Mild tingling, numbness, or dull aches. ? Burning sensations. ? Sharp pains.  Numbness in the back of the calf or the sole of the foot.  Leg weakness.  Severe back pain that makes movement difficult. Symptoms may get worse when you cough, sneeze, or laugh, or when you sit or stand for long periods of time. How is this diagnosed? This condition may be diagnosed based on:  Your symptoms and medical history.  A physical exam.  Blood tests.  Imaging tests, such as: ? X-rays. ? MRI. ? CT scan. How is this treated? In many cases, this condition improves on its own without treatment. However, treatment may include:  Reducing or modifying physical activity.  Exercising and stretching.  Icing and applying heat to the affected area.  Medicines that help to: ? Relieve pain and swelling. ? Relax your muscles.  Injections of medicines that help to relieve pain,  irritation, and inflammation around the sciatic nerve (steroids).  Surgery. Follow these instructions at home: Medicines  Take over-the-counter and prescription medicines only as told by your health care provider.  Ask your health care provider if the medicine prescribed to you: ? Requires you to avoid driving or using heavy machinery. ? Can cause constipation. You may need to take these actions to prevent or treat constipation:  Drink enough fluid to  keep your urine pale yellow.  Take over-the-counter or prescription medicines.  Eat foods that are high in fiber, such as beans, whole grains, and fresh fruits and vegetables.  Limit foods that are high in fat and processed sugars, such as fried or sweet foods. Managing pain      If directed, put ice on the affected area. ? Put ice in a plastic bag. ? Place a towel between your skin and the bag. ? Leave the ice on for 20 minutes, 2-3 times a day.  If directed, apply heat to the affected area. Use the heat source that your health care provider recommends, such as a moist heat pack or a heating pad. ? Place a towel between your skin and the heat source. ? Leave the heat on for 20-30 minutes. ? Remove the heat if your skin turns bright red. This is especially important if you are unable to feel pain, heat, or cold. You may have a greater risk of getting burned. Activity   Return to your normal activities as told by your health care provider. Ask your health care provider what activities are safe for you.  Avoid activities that make your symptoms worse.  Take brief periods of rest throughout the day. ? When you rest for longer periods, mix in some mild activity or stretching between periods of rest. This will help to prevent stiffness and pain. ? Avoid sitting for long periods of time without moving. Get up and move around at least one time each hour.  Exercise and stretch regularly, as told by your health care provider.  Do  not lift anything that is heavier than 10 lb (4.5 kg) while you have symptoms of sciatica. When you do not have symptoms, you should still avoid heavy lifting, especially repetitive heavy lifting.  When you lift objects, always use proper lifting technique, which includes: ? Bending your knees. ? Keeping the load close to your body. ? Avoiding twisting. General instructions  Maintain a healthy weight. Excess weight puts extra stress on your back.  Wear supportive, comfortable shoes. Avoid wearing high heels.  Avoid sleeping on a mattress that is too soft or too hard. A mattress that is firm enough to support your back when you sleep may help to reduce your pain.  Keep all follow-up visits as told by your health care provider. This is important. Contact a health care provider if:  You have pain that: ? Wakes you up when you are sleeping. ? Gets worse when you lie down. ? Is worse than you have experienced in the past. ? Lasts longer than 4 weeks.  You have an unexplained weight loss. Get help right away if:  You are not able to control when you urinate or have bowel movements (incontinence).  You have: ? Weakness in your lower back, pelvis, buttocks, or legs that gets worse. ? Redness or swelling of your back. ? A burning sensation when you urinate. Summary  Sciatica is pain, numbness, weakness, or tingling along the path of the sciatic nerve.  This condition is caused by pressure on the sciatic nerve or pinching of the nerve.  Sciatica can cause pain, numbness, or tingling in the lower back, legs, hips, and buttocks.  Treatment often includes rest, exercise, medicines, and applying ice or heat. This information is not intended to replace advice given to you by your health care provider. Make sure you discuss any questions you have with your health care provider. Document Revised: 05/19/2018 Document Reviewed:  05/19/2018 Elsevier Patient Education  [place].

## 2019-08-11 NOTE — Progress Notes (Signed)
DOB and name verified  C /o lower back pain radiating to the leg alternating from the left side to the right side/  Pain level at 8/10 Little relief with OTC meds / Took Motrin 400mg   Denies any injury to the area  Denies any burning on urination, fevers, urinary discomfort   CBG 90 fasting reported by patient

## 2019-08-11 NOTE — Progress Notes (Deleted)
Acute Office Visit  Subjective:    Patient ID: Jon [name], male    DOB: 10-09-1955, 64 y.o.   MRN: [id number]  No chief complaint on file.   Virtual Visit via Telephone Note  I connected with [name] on 08/11/19 at 11:00 AM EDT by telephone and verified that I am speaking with the correct person using two identifiers.  Location: Patient: *** Provider: ***   I discussed the limitations, risks, security and privacy concerns of performing an evaluation and management service by telephone and the availability of in person appointments. I also discussed with the patient that there may be a patient responsible charge related to this service. The patient expressed understanding and agreed to proceed.   History of Present Illness: Lower back pain radiating to right leg and left leg but not both at the same, - motrin 400 mg  Twice a dayusing heating padno relief , denies urinary discomfort, burning or fever, numbness or tingling  States that his sxs started on Thursday but did improve over the weekend , worse again on Sunday,   No previous low back pain history, no trauma       Observations/Objective:   Assessment and Plan:   Follow Up Instructions:    I discussed the assessment and treatment plan with the patient. The patient was provided an opportunity to ask questions and all were answered. The patient agreed with the plan and demonstrated an understanding of the instructions.   The patient was advised to call back or seek an in-person evaluation if the symptoms worsen or if the condition fails to improve as anticipated.  I provided *** minutes of non-face-to-face time during this encounter.   Loraine Grip Mayers, PA-C   HPI Patient is in today for ***  Past Medical History:  Diagnosis Date  . Diabetes mellitus without complication ([place])    type II  . History of kidney stones   . Hyperlipidemia   . Hypertension   . Kidney stones   . Stomach ulcer      Past Surgical History:  Procedure Laterality Date  . CYSTOSCOPY Left 12/10/2016   Procedure: cystoscopy with left ureteral stone extraction ;  Surgeon: Kathie Rhodes, MD;  Location: [facility];  Service: Urology;  Laterality: Left;  . HERNIA REPAIR    . Stomach ulcer      Family History  Problem Relation Age of Onset  . Diabetes Father   . Stroke Father   . Hypertension Father   . Hypertension Mother   . Diabetes Sister   . Diabetes Son   . Diabetes Daughter     Social History   Socioeconomic History  . Marital status: Divorced    Spouse name: Not on file  . Number of children: 4  . Years of education: 77  . Highest education level: Not on file  Occupational History  . Occupation: Unemployed  Tobacco Use  . Smoking status: Never Smoker  . Smokeless tobacco: Never Used  Substance and Sexual Activity  . Alcohol use: Yes    Alcohol/week: 1.0 - 2.0 standard drinks    Types: 1 - 2 Glasses of wine per week    Comment: socially  . Drug use: No  . Sexual activity: Not Currently  Other Topics Concern  . Not on file  Social History Narrative   Fun: Office manager   Social Determinants of Health   Financial Resource Strain:   . Difficulty of Paying Living Expenses:   Food Insecurity:   .  Worried About Charity fundraiser in the Last Year:   . Arboriculturist in the Last Year:   Transportation Needs:   . Film/video editor (Medical):   Marland Kitchen Lack of Transportation (Non-Medical):   Physical Activity:   . Days of Exercise per Week:   . Minutes of Exercise per Session:   Stress:   . Feeling of Stress :   Social Connections:   . Frequency of Communication with Friends and Family:   . Frequency of Social Gatherings with Friends and Family:   . Attends Religious Services:   . Active Member of Clubs or Organizations:   . Attends Archivist Meetings:   Marland Kitchen Marital Status:   Intimate Partner Violence:   . Fear of Current or Ex-Partner:   . Emotionally Abused:   Marland Kitchen  Physically Abused:   . Sexually Abused:     Outpatient Medications Prior to Visit  Medication Sig Dispense Refill  . losartan (COZAAR) 50 MG tablet TAKE 1 TABLET BY MOUTH  DAILY 90 tablet 0  . amLODipine (NORVASC) 10 MG tablet TAKE 1 TABLET BY MOUTH  DAILY 90 tablet 3  . atorvastatin (LIPITOR) 40 MG tablet TAKE 1 TABLET BY MOUTH  DAILY 90 tablet 3  . Blood Glucose Monitoring Suppl (ONETOUCH VERIO) w/Device KIT Use to check blood sugar daily. 1 kit 0  . clopidogrel (PLAVIX) 75 MG tablet TAKE 1 TABLET BY MOUTH  DAILY 90 tablet 3  . Continuous Blood Gluc Receiver (FREESTYLE LIBRE 14 DAY READER) DEVI 1 Device by Does not apply route daily. 1 each PRN  . Continuous Blood Gluc Receiver (FREESTYLE LIBRE 2 READER SYSTM) DEVI 1 Device by Does not apply route 3 (three) times daily. E11.65 1 each 0  . Continuous Blood Gluc Sensor (FREESTYLE LIBRE 14 DAY SENSOR) MISC 1 Dose by Does not apply route daily. 1 each PRN  . Dulaglutide (TRULICITY) 6.71 IW/5.8KD SOPN INJECT SUBCUTANEOUSLY 0.75  MG ONCE WEEKLY 6 mL 0  . glimepiride (AMARYL) 4 MG tablet TAKE 1 TABLET BY MOUTH  DAILY WITH BREAKFAST 90 tablet 3  . glucose blood (ONETOUCH VERIO) test strip Use as instructed to check blood sugar daily. 100 each 3  . Insulin Pen Needle (B-D UF III MINI PEN NEEDLES) 31G X 5 MM MISC Use as instructed. Inject into the skin once daily. 100 each 3  . metFORMIN (GLUCOPHAGE) 1000 MG tablet Take 1 tablet (1,000 mg total) by mouth 2 (two) times daily with a meal. 180 tablet 1  . OneTouch Delica Lancets 98P MISC Use as instructed to check blood sugar once daily. 100 each 3   No facility-administered medications prior to visit.    No Known Allergies  Review of Systems     Objective:    Physical Exam  There were no vitals taken for this visit. Wt Readings from Last 3 Encounters:  04/15/19 203 lb 12.8 oz (92.4 kg)  03/06/19 203 lb (92.1 kg)  10/24/18 203 lb (92.1 kg)    Health Maintenance Due  Topic Date Due  .  PNA vac Low Risk Adult (1 of 2 - PCV13) Never done  . OPHTHALMOLOGY EXAM  04/25/2019    There are no preventive care reminders to display for this patient.   Lab Results  Component Value Date   TSH 1.604 04/27/2017   Lab Results  Component Value Date   WBC 6.2 03/06/2019   HGB 13.2 03/06/2019   HCT 41.9 03/06/2019   MCV 76 (  L) 03/06/2019   PLT 309 03/06/2019   Lab Results  Component Value Date   NA 139 03/06/2019   K 4.3 03/06/2019   CO2 24 03/06/2019   GLUCOSE 126 (H) 03/06/2019   BUN 17 03/06/2019   CREATININE 0.96 03/06/2019   BILITOT 0.5 03/06/2019   ALKPHOS 76 03/06/2019   AST 15 03/06/2019   ALT 21 03/06/2019   PROT 7.7 03/06/2019   ALBUMIN 4.7 03/06/2019   CALCIUM 9.8 03/06/2019   ANIONGAP 10 08/05/2017   GFR 95.90 03/13/2017   Lab Results  Component Value Date   CHOL 164 06/10/2018   Lab Results  Component Value Date   HDL 39 (L) 06/10/2018   Lab Results  Component Value Date   LDLCALC 107 (H) 06/10/2018   Lab Results  Component Value Date   TRIG 88 06/10/2018   Lab Results  Component Value Date   CHOLHDL 4.2 06/10/2018   Lab Results  Component Value Date   HGBA1C 7.1 (A) 08/03/2019       Assessment & Plan:   Problem List Items Addressed This Visit    None       No orders of the defined types were placed in this encounter.    Loraine Grip Mayers, PA-C

## 2019-08-17 ENCOUNTER — Encounter: Payer: Self-pay | Admitting: Nurse Practitioner

## 2019-08-17 ENCOUNTER — Ambulatory Visit: Payer: 59 | Attending: Nurse Practitioner | Admitting: Nurse Practitioner

## 2019-08-17 ENCOUNTER — Other Ambulatory Visit: Payer: Self-pay

## 2019-08-17 DIAGNOSIS — M5441 Lumbago with sciatica, right side: Secondary | ICD-10-CM

## 2019-08-17 DIAGNOSIS — M5442 Lumbago with sciatica, left side: Secondary | ICD-10-CM

## 2019-08-17 DIAGNOSIS — E119 Type 2 diabetes mellitus without complications: Secondary | ICD-10-CM

## 2019-08-17 MED ORDER — LIDOCAINE 5 % EX PTCH: 1.0000 | MEDICATED_PATCH | CUTANEOUS | 0 refills | Status: DC

## 2019-08-17 MED ORDER — TIZANIDINE HCL 4 MG PO TABS: 4.0000 mg | ORAL_TABLET | Freq: Four times a day (QID) | ORAL | 0 refills | Status: DC | PRN

## 2019-08-17 NOTE — Progress Notes (Signed)
Virtual Visit via Telephone Note Due to national recommendations of social distancing due to [place] 19, telehealth visit is felt to be most appropriate for this patient at this time.  I discussed the limitations, risks, security and privacy concerns of performing an evaluation and management service by telephone and the availability of in person appointments. I also discussed with the patient that there may be a patient responsible charge related to this service. The patient expressed understanding and agreed to proceed.    I connected with Jon [name] on 08/17/19  at   3:50 PM EDT  EDT by telephone and verified that I am speaking with the correct person using two identifiers.   Consent I discussed the limitations, risks, security and privacy concerns of performing an evaluation and management service by telephone and the availability of in person appointments. I also discussed with the patient that there may be a patient responsible charge related to this service. The patient expressed understanding and agreed to proceed.   Location of Patient: Private  Residence    Location of Provider: [place] participating in Telemedicine visit: Jon [name] Jon [name]    History of Present Illness: Telemedicine visit for: METER CHECK  DM TYPE 2 Monitoring blood glucose levels once per day with fasting average: 120s. Denies any symptoms of hypo or hyperglycemia. Unfortunately he was unable to obtain a freestyle libre as it was not covered by his insurance. He is requesting that prescription be sent to the manufacturer. He will send me the information through my chart.  Lab Results  Component Value Date   HGBA1C 7.1 (A) 08/03/2019   Acute Back pain Onset 3 weeks ago. Denies any injury to trauma. Pain starts in the low back and radiates from one leg to another.  He was given a toradol and solumedrol injection on 3-30 and states  pain "eased a little" but has not returned. 4/10.  Has tried Motrin 400 mg twice a day, heating pad, topical pain cream without much relief. I have recommended he avoid ALL NSAIDs since he his also taking plavix.    Past Medical History:  Diagnosis Date  . Diabetes mellitus without complication ([place])    type II  . History of kidney stones   . Hyperlipidemia   . Hypertension   . Kidney stones   . Stomach ulcer     Past Surgical History:  Procedure Laterality Date  . CYSTOSCOPY Left 12/10/2016   Procedure: cystoscopy with left ureteral stone extraction ;  Surgeon: Kathie Rhodes, MD;  Location: [facility];  Service: Urology;  Laterality: Left;  . HERNIA REPAIR    . Stomach ulcer      Family History  Problem Relation Age of Onset  . Diabetes Father   . Stroke Father   . Hypertension Father   . Hypertension Mother   . Diabetes Sister   . Diabetes Son   . Diabetes Daughter     Social History   Socioeconomic History  . Marital status: Divorced    Spouse name: Not on file  . Number of children: 4  . Years of education: 51  . Highest education level: Not on file  Occupational History  . Occupation: Unemployed  Tobacco Use  . Smoking status: Never Smoker  . Smokeless tobacco: Never Used  Substance and Sexual Activity  . Alcohol use: Yes    Alcohol/week: 1.0 - 2.0 standard drinks    Types: 1 -  2 Glasses of wine per week    Comment: socially  . Drug use: No  . Sexual activity: Not Currently  Other Topics Concern  . Not on file  Social History Narrative   Fun: Office manager   Social Determinants of Health   Financial Resource Strain:   . Difficulty of Paying Living Expenses:   Food Insecurity:   . Worried About Charity fundraiser in the Last Year:   . Arboriculturist in the Last Year:   Transportation Needs:   . Film/video editor (Medical):   Marland Kitchen Lack of Transportation (Non-Medical):   Physical Activity:   . Days of Exercise per Week:   . Minutes of Exercise per  Session:   Stress:   . Feeling of Stress :   Social Connections:   . Frequency of Communication with Friends and Family:   . Frequency of Social Gatherings with Friends and Family:   . Attends Religious Services:   . Active Member of Clubs or Organizations:   . Attends Archivist Meetings:   Marland Kitchen Marital Status:      Observations/Objective: Awake, alert and oriented x 3   Review of Systems  Constitutional: Negative for fever, malaise/fatigue and weight loss.  HENT: Negative.  Negative for nosebleeds.   Eyes: Negative.  Negative for blurred vision, double vision and photophobia.  Respiratory: Negative.  Negative for cough and shortness of breath.   Cardiovascular: Negative.  Negative for chest pain, palpitations and leg swelling.  Gastrointestinal: Negative.  Negative for heartburn, nausea and vomiting.  Musculoskeletal: Positive for back pain and myalgias.  Neurological: Negative.  Negative for dizziness, focal weakness, seizures and headaches.  Psychiatric/Behavioral: Negative.  Negative for suicidal ideas.    Assessment and Plan: [name] was seen today for follow-up.  Diagnoses and all orders for this visit:  Type 2 diabetes mellitus without complication, without long-term current use of insulin (HCC) Continue blood sugar control as discussed in office today, low carbohydrate diet, and regular physical exercise as tolerated, 150 minutes per week (30 min each day, 5 days per week, or 50 min 3 days per week). Keep blood sugar logs with fasting goal of 90-130 mg/dl, post prandial (after you eat) less than 180.  For Hypoglycemia: BS <60 and Hyperglycemia BS >400; contact the clinic ASAP. Annual eye exams and foot exams are recommended.   Acute bilateral low back pain with bilateral sciatica -     lidocaine (LIDODERM) 5 %; Place 1 patch onto the skin daily. -     tiZANidine (ZANAFLEX) 4 MG tablet; Take 1 tablet (4 mg total) by mouth every 6 (six) hours as needed for muscle  spasms. Work on losing weight to help reduce back pain. May alternate with heat and ice application for pain relief. May also alternate with acetaminophen  as prescribed for back pain. Other alternatives include massage, acupuncture and water aerobics.  You must stay active and avoid a sedentary lifestyle.      Follow Up Instructions Return in about 4 weeks (around 09/14/2019).     I discussed the assessment and treatment plan with the patient. The patient was provided an opportunity to ask questions and all were answered. The patient agreed with the plan and demonstrated an understanding of the instructions.   The patient was advised to call back or seek an in-person evaluation if the symptoms worsen or if the condition fails to improve as anticipated.  I provided 18 minutes of non-face-to-face time during this  encounter including median intraservice time, reviewing previous notes, labs, imaging, medications and explaining diagnosis and management.  Gildardo Pounds, [name]

## 2019-08-18 ENCOUNTER — Encounter: Payer: Self-pay | Admitting: Nurse Practitioner

## 2019-08-18 MED FILL — tiZANidine HCL 4 MG TABS: 4 | 7 days supply | Qty: 30 | Fill #0

## 2019-08-24 ENCOUNTER — Ambulatory Visit (HOSPITAL_BASED_OUTPATIENT_CLINIC_OR_DEPARTMENT_OTHER): Payer: Medicare Other | Admitting: Pharmacist

## 2019-08-24 ENCOUNTER — Other Ambulatory Visit: Payer: Self-pay

## 2019-08-24 ENCOUNTER — Ambulatory Visit: Payer: Medicare Other | Attending: Nurse Practitioner | Admitting: Nurse Practitioner

## 2019-08-24 ENCOUNTER — Encounter: Payer: Self-pay | Admitting: Nurse Practitioner

## 2019-08-24 VITALS — BP 126/76 | HR 80 | Temp 97.9°F | Ht 70.0 in | Wt 202.0 lb

## 2019-08-24 DIAGNOSIS — M545 Low back pain, unspecified: Secondary | ICD-10-CM

## 2019-08-24 DIAGNOSIS — Z23 Encounter for immunization: Secondary | ICD-10-CM | POA: Diagnosis not present

## 2019-08-24 DIAGNOSIS — Z0000 Encounter for general adult medical examination without abnormal findings: Secondary | ICD-10-CM | POA: Diagnosis not present

## 2019-08-24 DIAGNOSIS — E119 Type 2 diabetes mellitus without complications: Secondary | ICD-10-CM | POA: Diagnosis not present

## 2019-08-24 DIAGNOSIS — I10 Essential (primary) hypertension: Secondary | ICD-10-CM

## 2019-08-24 DIAGNOSIS — D649 Anemia, unspecified: Secondary | ICD-10-CM

## 2019-08-24 LAB — GLUCOSE, POCT (MANUAL RESULT ENTRY): POC Glucose: 198 mg/dl — AB (ref 70–99)

## 2019-08-24 MED ORDER — ACETAMINOPHEN-CODEINE #3 300-30 MG PO TABS: 1.0000 | ORAL_TABLET | Freq: Three times a day (TID) | ORAL | 0 refills | Status: DC | PRN

## 2019-08-24 MED FILL — ACETAMINOPHEN/COD #3 TABLET: 300-30 | 7 days supply | Qty: 42 | Fill #0

## 2019-08-24 NOTE — Progress Notes (Signed)
Patient presents for vaccination against strep pneumo per orders of Zelda. Consent given. Counseling provided. No contraindications exists. Vaccine administered without incident.

## 2019-08-24 NOTE — Progress Notes (Signed)
Assessment & Plan:  [name] was seen today for annual exam.  Diagnoses and all orders for this visit:  Encounter for annual physical exam  Type 2 diabetes mellitus without complication, without long-term current use of insulin (HCC) -     Glucose (CBG) -     Lipid panel Continue blood sugar control as discussed in office today, low carbohydrate diet, and regular physical exercise as tolerated, 150 minutes per week (30 min each day, 5 days per week, or 50 min 3 days per week). Keep blood sugar logs with fasting goal of 90-130 mg/dl, post prandial (after you eat) less than 180.  For Hypoglycemia: BS <60 and Hyperglycemia BS >400; contact the clinic ASAP. Annual eye exams and foot exams are recommended.   Anemia, unspecified type -     CBC  Essential hypertension -     CMP14+EGFR Continue all antihypertensives as prescribed.  Remember to bring in your blood pressure log with you for your follow up appointment.  DASH/Mediterranean Diets are healthier choices for HTN.  Well controlled.   Acute bilateral low back pain without sciatica -     acetaminophen-codeine (TYLENOL #3) 300-30 MG tablet; Take 1-2 tablets by mouth every 8 (eight) hours as needed for moderate pain or severe pain. Work on losing weight to help reduce back pain. May alternate with heat and ice application for pain relief.  Other alternatives include massage, acupuncture and water aerobics.  You must stay active and avoid a sedentary lifestyle.      Patient has been counseled on age-appropriate routine health concerns for screening and prevention. These are reviewed and up-to-date. Referrals have been placed accordingly. Immunizations are up-to-date or declined.    Subjective:   Chief Complaint  Patient presents with  . Annual Exam    Pt. is here for a physical.   [name] 64 y.o. male presents to office today for annual  Physical exam.    Low back Pain Initially evaluated on 08-11-2019 with low  back pain. Denied any injury or trauma. At that time he was also experiencing bilateral sciatica however today denies any sciatica symptoms but still experiencing back pain. Treatments tried: Motrin 400 mg twice a day (which I have requested he stop using NSAIDs as he is also taking plavix),  heating pad, topical pain cream without much relief.  He was given a toradol and solumedrol injection on 3-30 and states pain "eased a little" but has now returned. On 08-17-2019 I prescribed him tizanidine 64m and a lidoderm patch (insurance would not cover patch). Today he states tizanidine is helpful however he can not take it during the day as it causes drowsiness. I have encouraged him to break it in half during the day. Will also prescribe tylenol #3 today. He inquired into acupuncture and seeing a chiropractor. I recommended acupuncture and massage therapy.    Review of Systems  Constitutional: Negative for fever, malaise/fatigue and weight loss.  HENT: Negative.  Negative for nosebleeds.   Eyes: Negative.  Negative for blurred vision, double vision and photophobia.  Respiratory: Negative.  Negative for cough and shortness of breath.   Cardiovascular: Negative.  Negative for chest pain, palpitations and leg swelling.  Gastrointestinal: Negative.  Negative for abdominal pain, blood in stool, constipation, diarrhea, heartburn, melena, nausea and vomiting.  Genitourinary:       Denies involuntary loss of bowel or urine  Musculoskeletal: Positive for back pain and myalgias.  Neurological: Negative.  Negative for dizziness, focal weakness,  seizures and headaches.  Psychiatric/Behavioral: Negative.  Negative for suicidal ideas.    Past Medical History:  Diagnosis Date  . Diabetes mellitus without complication ([place])    type II  . History of kidney stones   . Hyperlipidemia   . Hypertension   . Kidney stones   . Stomach ulcer     Past Surgical History:  Procedure Laterality Date  . CYSTOSCOPY Left  12/10/2016   Procedure: cystoscopy with left ureteral stone extraction ;  Surgeon: Kathie Rhodes, MD;  Location: [facility];  Service: Urology;  Laterality: Left;  . HERNIA REPAIR    . Stomach ulcer      Family History  Problem Relation Age of Onset  . Diabetes Father   . Stroke Father   . Hypertension Father   . Hypertension Mother   . Diabetes Sister   . Diabetes Son   . Diabetes Daughter     Social History Reviewed with no changes to be made today.   Outpatient Medications Prior to Visit  Medication Sig Dispense Refill  . amLODipine (NORVASC) 10 MG tablet TAKE 1 TABLET BY MOUTH  DAILY 90 tablet 3  . atorvastatin (LIPITOR) 40 MG tablet TAKE 1 TABLET BY MOUTH  DAILY 90 tablet 3  . Blood Glucose Monitoring Suppl (ONETOUCH VERIO) w/Device KIT Use to check blood sugar daily. 1 kit 0  . Dulaglutide (TRULICITY) 8.84 ZY/6.0YT SOPN INJECT SUBCUTANEOUSLY 0.75  MG ONCE WEEKLY 6 mL 0  . glimepiride (AMARYL) 4 MG tablet TAKE 1 TABLET BY MOUTH  DAILY WITH BREAKFAST 90 tablet 3  . glucose blood (ONETOUCH VERIO) test strip Use as instructed to check blood sugar daily. 100 each 3  . Insulin Pen Needle (B-D UF III MINI PEN NEEDLES) 31G X 5 MM MISC Use as instructed. Inject into the skin once daily. 100 each 3  . lidocaine (LIDODERM) 5 % Place 1 patch onto the skin daily. 30 patch 0  . losartan (COZAAR) 50 MG tablet TAKE 1 TABLET BY MOUTH  DAILY 90 tablet 0  . metFORMIN (GLUCOPHAGE) 1000 MG tablet Take 1 tablet (1,000 mg total) by mouth 2 (two) times daily with a meal. 180 tablet 1  . OneTouch Delica Lancets 01S MISC Use as instructed to check blood sugar once daily. 100 each 3  . tiZANidine (ZANAFLEX) 4 MG tablet Take 1 tablet (4 mg total) by mouth every 6 (six) hours as needed for muscle spasms. 30 tablet 0  . clopidogrel (PLAVIX) 75 MG tablet TAKE 1 TABLET BY MOUTH  DAILY 90 tablet 3  . Continuous Blood Gluc Receiver (FREESTYLE LIBRE 14 DAY READER) DEVI 1 Device by Does not apply route daily.  (Patient not taking: Reported on 08/17/2019) 1 each PRN  . Continuous Blood Gluc Receiver (FREESTYLE LIBRE 2 READER SYSTM) DEVI 1 Device by Does not apply route 3 (three) times daily. E11.65 (Patient not taking: Reported on 08/17/2019) 1 each 0  . Continuous Blood Gluc Sensor (FREESTYLE LIBRE 14 DAY SENSOR) MISC 1 Dose by Does not apply route daily. (Patient not taking: Reported on 08/17/2019) 1 each PRN   No facility-administered medications prior to visit.    No Known Allergies     Objective:    BP 126/76 (BP Location: Left Arm, Patient Position: Sitting, Cuff Size: Normal)   Pulse 80   Temp 97.9 F (36.6 C) (Temporal)   Ht _0  (1.778 m)   Wt 202 lb (91.6 kg)   SpO2 97%   BMI 28.98 kg/m  Wt Readings from Last 3 Encounters:  08/24/19 202 lb (91.6 kg)  08/11/19 200 lb 3.2 oz (90.8 kg)  04/15/19 203 lb 12.8 oz (92.4 kg)    Physical Exam Vitals and nursing note reviewed.  Constitutional:      Appearance: He is well-developed.  HENT:     Head: Normocephalic and atraumatic.  Eyes:     General: Lids are normal. Gaze aligned appropriately.     Extraocular Movements: Extraocular movements intact.     Right eye: Normal extraocular motion and no nystagmus.     Left eye: Normal extraocular motion and no nystagmus.  Cardiovascular:     Rate and Rhythm: Normal rate and regular rhythm.     Pulses:          Posterior tibial pulses are 2+ on the right side and 2+ on the left side.     Heart sounds: Normal heart sounds. No murmur. No friction rub. No gallop.   Pulmonary:     Effort: Pulmonary effort is normal. No tachypnea or respiratory distress.     Breath sounds: Normal breath sounds. No decreased breath sounds, wheezing, rhonchi or rales.  Chest:     Chest wall: No tenderness.  Abdominal:     General: Bowel sounds are normal.     Palpations: Abdomen is soft.  Musculoskeletal:        General: Normal range of motion.     Cervical back: Normal range of motion.     Lumbar back:  Tenderness present. No bony tenderness. Negative right straight leg raise test and negative left straight leg raise test.       Back:  Feet:     Right foot:     Toenail Condition: Right toenails are abnormally thick.     Left foot:     Toenail Condition: Left toenails are abnormally thick.  Skin:    General: Skin is warm and dry.  Neurological:     Mental Status: He is alert and oriented to person, place, and time.     Cranial Nerves: Cranial nerves are intact.     Sensory: Sensation is intact.     Motor: Motor function is intact.     Coordination: Coordination is intact. Coordination normal.     Gait: Gait is intact.     Deep Tendon Reflexes:     Reflex Scores:      Patellar reflexes are 2+ on the right side and 2+ on the left side. Psychiatric:        Behavior: Behavior normal. Behavior is cooperative.        Thought Content: Thought content normal.        Judgment: Judgment normal.          Patient has been counseled extensively about nutrition and exercise as well as the importance of adherence with medications and regular follow-up. The patient was given clear instructions to go to ER or return to medical center if symptoms don't improve, worsen or new problems develop. The patient verbalized understanding.   Follow-up: Return in about 3 months (around 11/23/2019).   Gildardo Pounds, FNP-BC [facility] [place], [place]   08/24/2019, 3:15 PM

## 2019-08-25 LAB — CMP14+EGFR
ALT: 16 IU/L (ref 0–44)
AST: 15 IU/L (ref 0–40)
Albumin/Globulin Ratio: 1.5 (ref 1.2–2.2)
Albumin: 4.8 g/dL (ref 3.8–4.8)
Alkaline Phosphatase: 63 IU/L (ref 39–117)
BUN/Creatinine Ratio: 15 (ref 10–24)
BUN: 16 mg/dL (ref 8–27)
Bilirubin Total: 0.4 mg/dL (ref 0.0–1.2)
CO2: 21 mmol/L (ref 20–29)
Calcium: 9.5 mg/dL (ref 8.6–10.2)
Chloride: 103 mmol/L (ref 96–106)
Creatinine, Ser: 1.05 mg/dL (ref 0.76–1.27)
GFR calc Af Amer: 86 mL/min/{1.73_m2} (ref >59)
GFR calc non Af Amer: 74 mL/min/{1.73_m2} (ref >59)
Globulin, Total: 3.1 g/dL (ref 1.5–4.5)
Glucose: 149 mg/dL — ABNORMAL HIGH (ref 65–99)
Potassium: 4.7 mmol/L (ref 3.5–5.2)
Sodium: 141 mmol/L (ref 134–144)
Total Protein: 7.9 g/dL (ref 6.0–8.5)

## 2019-08-25 LAB — CBC
Hematocrit: 40.1 % (ref 37.5–51.0)
Hemoglobin: 12.7 g/dL — ABNORMAL LOW (ref 13.0–17.7)
MCH: 24.8 pg — ABNORMAL LOW (ref 26.6–33.0)
MCHC: 31.7 g/dL (ref 31.5–35.7)
MCV: 78 fL — ABNORMAL LOW (ref 79–97)
Platelets: 274 10*3/uL (ref 150–450)
RBC: 5.13 x10E6/uL (ref 4.14–5.80)
RDW: 16.7 % — ABNORMAL HIGH (ref 11.6–15.4)
WBC: 5.6 10*3/uL (ref 3.4–10.8)

## 2019-08-25 LAB — LIPID PANEL
Chol/HDL Ratio: 3.8 ratio (ref 0.0–5.0)
Cholesterol, Total: 156 mg/dL (ref 100–199)
HDL: 41 mg/dL (ref >39)
LDL Chol Calc (NIH): 99 mg/dL (ref 0–99)
Triglycerides: 86 mg/dL (ref 0–149)
VLDL Cholesterol Cal: 16 mg/dL (ref 5–40)

## 2019-08-27 ENCOUNTER — Other Ambulatory Visit: Payer: Self-pay | Admitting: Nurse Practitioner

## 2019-08-27 DIAGNOSIS — Z1211 Encounter for screening for malignant neoplasm of colon: Secondary | ICD-10-CM

## 2019-08-27 DIAGNOSIS — D649 Anemia, unspecified: Secondary | ICD-10-CM

## 2019-09-14 ENCOUNTER — Telehealth: Payer: Self-pay | Admitting: Nurse Practitioner

## 2019-09-14 ENCOUNTER — Other Ambulatory Visit: Payer: Self-pay

## 2019-09-14 ENCOUNTER — Ambulatory Visit: Payer: Medicare Other | Admitting: Physician Assistant

## 2019-09-14 VITALS — BP 135/78 | HR 91 | Ht 71.0 in | Wt 196.6 lb

## 2019-09-14 DIAGNOSIS — R42 Dizziness and giddiness: Secondary | ICD-10-CM

## 2019-09-14 DIAGNOSIS — R200 Anesthesia of skin: Secondary | ICD-10-CM

## 2019-09-14 DIAGNOSIS — D649 Anemia, unspecified: Secondary | ICD-10-CM

## 2019-09-14 NOTE — Progress Notes (Signed)
Established Patient Office Visit  Subjective:  Patient ID: Jon [name], male    DOB: 1955-11-07  Age: 64 y.o. MRN: [id number]  CC:  Chief Complaint  Patient presents with  . Numbness    right hand  . Fatigue    HPI [name] states that he woke up this morning with numbness in his right fingertips, states that this has not happened before, has not improved, has not tried anything for relief.  States that he has been feeling" off balance" since last night, felt dizzy and as if he would pass out. Denies ay new meds, changes in diet, new stressors.  Does have significant history of CVA in 2018, is on Plavix  Reports his BG this morning was 179   Past Medical History:  Diagnosis Date  . Diabetes mellitus without complication ([place])    type II  . History of kidney stones   . Hyperlipidemia   . Hypertension   . Kidney stones   . Stomach ulcer   . Stroke [facility])    Dec. 14 2018    Past Surgical History:  Procedure Laterality Date  . CYSTOSCOPY Left 12/10/2016   Procedure: cystoscopy with left ureteral stone extraction ;  Surgeon: Kathie Rhodes, MD;  Location: [facility];  Service: Urology;  Laterality: Left;  . HERNIA REPAIR    . Stomach ulcer      Family History  Problem Relation Age of Onset  . Diabetes Father   . Stroke Father   . Hypertension Father   . Hypertension Mother   . Diabetes Sister   . Diabetes Son   . Diabetes Daughter     Social History   Socioeconomic History  . Marital status: Divorced    Spouse name: Not on file  . Number of children: 4  . Years of education: 29  . Highest education level: Not on file  Occupational History  . Occupation: Unemployed  Tobacco Use  . Smoking status: Never Smoker  . Smokeless tobacco: Never Used  Substance and Sexual Activity  . Alcohol use: Yes    Alcohol/week: 1.0 - 2.0 standard drinks    Types: 1 - 2 Glasses of wine per week    Comment: socially  . Drug use: No  . Sexual activity: Not Currently   Other Topics Concern  . Not on file  Social History Narrative   Fun: Office manager   Social Determinants of Health   Financial Resource Strain:   . Difficulty of Paying Living Expenses:   Food Insecurity:   . Worried About Charity fundraiser in the Last Year:   . Arboriculturist in the Last Year:   Transportation Needs:   . Film/video editor (Medical):   Marland Kitchen Lack of Transportation (Non-Medical):   Physical Activity:   . Days of Exercise per Week:   . Minutes of Exercise per Session:   Stress:   . Feeling of Stress :   Social Connections:   . Frequency of Communication with Friends and Family:   . Frequency of Social Gatherings with Friends and Family:   . Attends Religious Services:   . Active Member of Clubs or Organizations:   . Attends Archivist Meetings:   Marland Kitchen Marital Status:   Intimate Partner Violence:   . Fear of Current or Ex-Partner:   . Emotionally Abused:   Marland Kitchen Physically Abused:   . Sexually Abused:     Outpatient Medications Prior to Visit  Medication Sig Dispense  Refill  . acetaminophen-codeine (TYLENOL #3) 300-30 MG tablet Take 1-2 tablets by mouth every 8 (eight) hours as needed for moderate pain or severe pain. 60 tablet 0  . amLODipine (NORVASC) 10 MG tablet TAKE 1 TABLET BY MOUTH  DAILY 90 tablet 3  . atorvastatin (LIPITOR) 40 MG tablet TAKE 1 TABLET BY MOUTH  DAILY 90 tablet 3  . Blood Glucose Monitoring Suppl (ONETOUCH VERIO) w/Device KIT Use to check blood sugar daily. 1 kit 0  . clopidogrel (PLAVIX) 75 MG tablet TAKE 1 TABLET BY MOUTH  DAILY 90 tablet 3  . Continuous Blood Gluc Receiver (FREESTYLE LIBRE 14 DAY READER) DEVI 1 Device by Does not apply route daily. 1 each PRN  . Continuous Blood Gluc Receiver (FREESTYLE LIBRE 2 READER SYSTM) DEVI 1 Device by Does not apply route 3 (three) times daily. E11.65 1 each 0  . Continuous Blood Gluc Sensor (FREESTYLE LIBRE 14 DAY SENSOR) MISC 1 Dose by Does not apply route daily. 1 each PRN  .  Dulaglutide (TRULICITY) 3.47 QQ/5.9DG SOPN INJECT SUBCUTANEOUSLY 0.75  MG ONCE WEEKLY 6 mL 0  . glimepiride (AMARYL) 4 MG tablet TAKE 1 TABLET BY MOUTH  DAILY WITH BREAKFAST 90 tablet 3  . glucose blood (ONETOUCH VERIO) test strip Use as instructed to check blood sugar daily. 100 each 3  . Insulin Pen Needle (B-D UF III MINI PEN NEEDLES) 31G X 5 MM MISC Use as instructed. Inject into the skin once daily. 100 each 3  . lidocaine (LIDODERM) 5 % Place 1 patch onto the skin daily. 30 patch 0  . losartan (COZAAR) 50 MG tablet TAKE 1 TABLET BY MOUTH  DAILY 90 tablet 0  . metFORMIN (GLUCOPHAGE) 1000 MG tablet Take 1 tablet (1,000 mg total) by mouth 2 (two) times daily with a meal. 180 tablet 1  . OneTouch Delica Lancets 38V MISC Use as instructed to check blood sugar once daily. 100 each 3  . tiZANidine (ZANAFLEX) 4 MG tablet Take 1 tablet (4 mg total) by mouth every 6 (six) hours as needed for muscle spasms. (Patient not taking: Reported on 09/14/2019) 30 tablet 0   No facility-administered medications prior to visit.    No Known Allergies  ROS Review of Systems  Constitutional: Negative for chills, fatigue and fever.  HENT: Negative.   Eyes: Negative for visual disturbance.  Respiratory: Negative for shortness of breath and wheezing.   Cardiovascular: Negative for chest pain and palpitations.  Gastrointestinal: Negative for diarrhea, nausea and vomiting.  Endocrine: Negative.   Genitourinary: Negative.   Musculoskeletal: Negative.   Skin: Negative.   Allergic/Immunologic: Negative.   Neurological: Positive for dizziness, light-headedness and numbness. Negative for syncope, facial asymmetry, speech difficulty, weakness and headaches.  Hematological: Negative.   Psychiatric/Behavioral: Negative.       Objective:    Physical Exam  Constitutional: He is oriented to person, place, and time. He appears well-developed and well-nourished. No distress.  HENT:  Head: Normocephalic and  atraumatic.  Right Ear: External ear normal.  Left Ear: External ear normal.  Mouth/Throat: Oropharynx is clear and moist.  Cardiovascular: Normal rate, regular rhythm and normal heart sounds.  Pulmonary/Chest: Effort normal and breath sounds normal.  Abdominal: Soft. Bowel sounds are normal.  Musculoskeletal:        General: Normal range of motion.  Neurological: He is alert and oriented to person, place, and time. He has normal reflexes. No cranial nerve deficit or sensory deficit. Coordination and gait normal.  Skin: Skin is  warm and dry. He is not diaphoretic.  Psychiatric: He has a normal mood and affect. His behavior is normal. Judgment and thought content normal.    BP 135/78   Pulse (!) 38   Ht '5\' 11"'$  (1.803 m)   Wt 196 lb 9.6 oz (89.2 kg)   BMI 27.42 kg/m  Wt Readings from Last 3 Encounters:  09/14/19 196 lb 9.6 oz (89.2 kg)  08/24/19 202 lb (91.6 kg)  08/11/19 200 lb 3.2 oz (90.8 kg)     Health Maintenance Due  Topic Date Due  . COVID-19 Vaccine (1) Never done  . OPHTHALMOLOGY EXAM  04/25/2019    There are no preventive care reminders to display for this patient.  Lab Results  Component Value Date   TSH 1.604 04/27/2017   Lab Results  Component Value Date   WBC 4.9 09/14/2019   HGB 12.7 (L) 09/14/2019   HCT 40.6 09/14/2019   MCV 77 (L) 09/14/2019   PLT 292 09/14/2019   Lab Results  Component Value Date   NA 141 08/24/2019   K 4.7 08/24/2019   CO2 21 08/24/2019   GLUCOSE 149 (H) 08/24/2019   BUN 16 08/24/2019   CREATININE 1.05 08/24/2019   BILITOT 0.4 08/24/2019   ALKPHOS 63 08/24/2019   AST 15 08/24/2019   ALT 16 08/24/2019   PROT 7.9 08/24/2019   ALBUMIN 4.8 08/24/2019   CALCIUM 9.5 08/24/2019   ANIONGAP 10 08/05/2017   GFR 95.90 03/13/2017   Lab Results  Component Value Date   CHOL 156 08/24/2019   Lab Results  Component Value Date   HDL 41 08/24/2019   Lab Results  Component Value Date   LDLCALC 99 08/24/2019   Lab Results   Component Value Date   TRIG 86 08/24/2019   Lab Results  Component Value Date   CHOLHDL 3.8 08/24/2019   Lab Results  Component Value Date   HGBA1C 7.1 (A) 08/03/2019      Assessment & Plan:   Problem List Items Addressed This Visit    None    Visit Diagnoses    Anemia, unspecified type    -  Primary   Relevant Orders   CBC with Differential/Platelet (Completed)   Iron, TIBC and Ferritin Panel (Completed)   B12 and Folate Panel (Completed)   Fecal Occult Blood, Guaiac([place])    1. Dizziness and giddiness Patient was last seen and had labs completed on 08/24/19, TSH was WNL, his hgb was 12.7 and was referred to GI for colonoscopy for further evaluation.   Will repeat CBC and check iron, b12 and folate as well as stool cards.  Encouraged patient to keep f/u appt with GI.  Red flags given for prompt evaluation at ER.   BP, pulse, denies headache, mini neuro exam was WNL  2. Anemia, unspecified type  - CBC with Differential/Platelet - Iron, TIBC and Ferritin Panel - B12 and Folate Panel - Fecal Occult Blood, Guaiac([facility])  3. Numbness of fingers Gave patient education on carpal tunnel exercises, bracing.   I have reviewed the patient's medical history (PMH, PSH, Social History, Family History, Medications, and allergies) , and have been updated if relevant. I spent 30 minutes reviewing chart and  face to face time with patient.     No orders of the defined types were placed in this encounter.   Follow-up: Return if symptoms worsen or fail to improve, for Keep f/u appt with GI and PCP.    Loraine Grip Mayers, PA-C

## 2019-09-14 NOTE — Progress Notes (Signed)
Patient states he has numbness in right hand. Numbness began last night. Patient states he feels off balance. He states that he has not fallen.

## 2019-09-14 NOTE — Telephone Encounter (Signed)
error 

## 2019-09-14 NOTE — Patient Instructions (Signed)
Anemia  Anemia is a condition in which you do not have enough red blood cells or hemoglobin. Hemoglobin is a substance in red blood cells that carries oxygen. When you do not have enough red blood cells or hemoglobin (are anemic), your body cannot get enough oxygen and your organs may not work properly. As a result, you may feel very tired or have other problems. What are the causes? Common causes of anemia include:  Excessive bleeding. Anemia can be caused by excessive bleeding inside or outside the body, including bleeding from the intestine or from periods in women.  Poor nutrition.  Long-lasting (chronic) kidney, thyroid, and liver disease.  Bone marrow disorders.  Cancer and treatments for cancer.  HIV (human immunodeficiency virus) and AIDS (acquired immunodeficiency syndrome).  Treatments for HIV and AIDS.  Spleen problems.  Blood disorders.  Infections, medicines, and autoimmune disorders that destroy red blood cells. What are the signs or symptoms? Symptoms of this condition include:  Minor weakness.  Dizziness.  Headache.  Feeling heartbeats that are irregular or faster than normal (palpitations).  Shortness of breath, especially with exercise.  Paleness.  Cold sensitivity.  Indigestion.  Nausea.  Difficulty sleeping.  Difficulty concentrating. Symptoms may occur suddenly or develop slowly. If your anemia is mild, you may not have symptoms. How is this diagnosed? This condition is diagnosed based on:  Blood tests.  Your medical history.  A physical exam.  Bone marrow biopsy. Your health care provider may also check your stool (feces) for blood and may do additional testing to look for the cause of your bleeding. You may also have other tests, including:  Imaging tests, such as a CT scan or MRI.  Endoscopy.  Colonoscopy. How is this treated? Treatment for this condition depends on the cause. If you continue to lose a lot of blood, you may  need to be treated at a hospital. Treatment may include:  Taking supplements of iron, vitamin S31, or folic acid.  Taking a hormone medicine (erythropoietin) that can help to stimulate red blood cell growth.  Having a blood transfusion. This may be needed if you lose a lot of blood.  Making changes to your diet.  Having surgery to remove your spleen. Follow these instructions at home:  Take over-the-counter and prescription medicines only as told by your health care provider.  Take supplements only as told by your health care provider.  Follow any diet instructions that you were given.  Keep all follow-up visits as told by your health care provider. This is important. Contact a health care provider if:  You develop new bleeding anywhere in the body. Get help right away if:  You are very weak.  You are short of breath.  You have pain in your abdomen or chest.  You are dizzy or feel faint.  You have trouble concentrating.  You have bloody or black, tarry stools.  You vomit repeatedly or you vomit up blood. Summary  Anemia is a condition in which you do not have enough red blood cells or enough of a substance in your red blood cells that carries oxygen (hemoglobin).  Symptoms may occur suddenly or develop slowly.  If your anemia is mild, you may not have symptoms.  This condition is diagnosed with blood tests as well as a medical history and physical exam. Other tests may be needed.  Treatment for this condition depends on the cause of the anemia. This information is not intended to replace advice given to you by  your health care provider. Make sure you discuss any questions you have with your health care provider. Document Revised: 04/12/2017 Document Reviewed: 06/01/2016 Elsevier Patient Education  Bridger Syndrome  Carpal tunnel syndrome is a condition that causes pain in your hand and arm. The carpal tunnel is a narrow area located on  the palm side of your wrist. Repeated wrist motion or certain diseases may cause swelling within the tunnel. This swelling pinches the main nerve in the wrist (median nerve). What are the causes? This condition may be caused by:  Repeated wrist motions.  Wrist injuries.  Arthritis.  A cyst or tumor in the carpal tunnel.  Fluid buildup during pregnancy. Sometimes the cause of this condition is not known. What increases the risk? The following factors may make you more likely to develop this condition:  Having a job, such as being a Research scientist (life sciences), that requires you to repeatedly move your wrist in the same motion.  Being a woman.  Having certain conditions, such as: ? Diabetes. ? Obesity. ? An underactive thyroid (hypothyroidism). ? Kidney failure. What are the signs or symptoms? Symptoms of this condition include:  A tingling feeling in your fingers, especially in your thumb, index, and middle fingers.  Tingling or numbness in your hand.  An aching feeling in your entire arm, especially when your wrist and elbow are bent for a long time.  Wrist pain that goes up your arm to your shoulder.  Pain that goes down into your palm or fingers.  A weak feeling in your hands. You may have trouble grabbing and holding items. Your symptoms may feel worse during the night. How is this diagnosed? This condition is diagnosed with a medical history and physical exam. You may also have tests, including:  Electromyogram (EMG). This test measures electrical signals sent by your nerves into the muscles.  Nerve conduction study. This test measures how well electrical signals pass through your nerves.  Imaging tests, such as X-rays, ultrasound, and MRI. These tests check for possible causes of your condition. How is this treated? This condition may be treated with:  Lifestyle changes. It is important to stop or change the activity that caused your condition.  Doing exercise and  activities to strengthen your muscles and bones (physical therapy).  Learning how to use your hand again after diagnosis (occupational therapy).  Medicines for pain and inflammation. This may include medicine that is injected into your wrist.  A wrist splint.  Surgery. Follow these instructions at home: If you have a splint:  Wear the splint as told by your health care provider. Remove it only as told by your health care provider.  Loosen the splint if your fingers tingle, become numb, or turn cold and blue.  Keep the splint clean.  If the splint is not waterproof: ? Do not let it get wet. ? Cover it with a watertight covering when you take a bath or shower. Managing pain, stiffness, and swelling   If directed, put ice on the painful area: ? If you have a removable splint, remove it as told by your health care provider. ? Put ice in a plastic bag. ? Place a towel between your skin and the bag. ? Leave the ice on for 20 minutes, 2-3 times per day. General instructions  Take over-the-counter and prescription medicines only as told by your health care provider.  Rest your wrist from any activity that may be causing your pain. If  your condition is work related, talk with your employer about changes that can be made, such as getting a wrist pad to use while typing.  Do any exercises as told by your health care provider, physical therapist, or occupational therapist.  Keep all follow-up visits as told by your health care provider. This is important. Contact a health care provider if:  You have new symptoms.  Your pain is not controlled with medicines.  Your symptoms get worse. Get help right away if:  You have severe numbness or tingling in your wrist or hand. Summary  Carpal tunnel syndrome is a condition that causes pain in your hand and arm.  It is usually caused by repeated wrist motions.  Lifestyle changes and medicines are used to treat carpal tunnel syndrome.  Surgery may be recommended.  Follow your health care provider's instructions about wearing a splint, resting from activity, keeping follow-up visits, and calling for help. This information is not intended to replace advice given to you by your health care provider. Make sure you discuss any questions you have with your health care provider. Document Revised: 09/06/2017 Document Reviewed: 09/06/2017 Elsevier Patient Education  [place].

## 2019-09-15 ENCOUNTER — Encounter: Payer: Self-pay | Admitting: Nurse Practitioner

## 2019-09-15 LAB — CBC WITH DIFFERENTIAL/PLATELET
Basophils Absolute: 0 10*3/uL (ref 0.0–0.2)
Basos: 1 %
EOS (ABSOLUTE): 0.2 10*3/uL (ref 0.0–0.4)
Eos: 4 %
Hematocrit: 40.6 % (ref 37.5–51.0)
Hemoglobin: 12.7 g/dL — ABNORMAL LOW (ref 13.0–17.7)
Immature Grans (Abs): 0 10*3/uL (ref 0.0–0.1)
Immature Granulocytes: 0 %
Lymphocytes Absolute: 1.4 10*3/uL (ref 0.7–3.1)
Lymphs: 28 %
MCH: 24.1 pg — ABNORMAL LOW (ref 26.6–33.0)
MCHC: 31.3 g/dL — ABNORMAL LOW (ref 31.5–35.7)
MCV: 77 fL — ABNORMAL LOW (ref 79–97)
Monocytes Absolute: 0.3 10*3/uL (ref 0.1–0.9)
Monocytes: 7 %
Neutrophils Absolute: 2.9 10*3/uL (ref 1.4–7.0)
Neutrophils: 60 %
Platelets: 292 10*3/uL (ref 150–450)
RBC: 5.28 x10E6/uL (ref 4.14–5.80)
RDW: 16.2 % — ABNORMAL HIGH (ref 11.6–15.4)
WBC: 4.9 10*3/uL (ref 3.4–10.8)

## 2019-09-15 LAB — GLUCOSE, POCT (MANUAL RESULT ENTRY): POC Glucose: 204 mg/dl — AB (ref 70–99)

## 2019-09-15 LAB — B12 AND FOLATE PANEL
Folate: 7.2 ng/mL (ref >3.0)
Vitamin B-12: 1068 pg/mL (ref 232–1245)

## 2019-09-15 LAB — IRON,TIBC AND FERRITIN PANEL
Ferritin: 36 ng/mL (ref 30–400)
Iron Saturation: 11 % — ABNORMAL LOW (ref 15–55)
Iron: 42 ug/dL (ref 38–169)
Total Iron Binding Capacity: 372 ug/dL (ref 250–450)
UIBC: 330 ug/dL (ref 111–343)

## 2019-09-17 ENCOUNTER — Observation Stay (HOSPITAL_COMMUNITY): Payer: Medicare Other

## 2019-09-17 ENCOUNTER — Observation Stay (HOSPITAL_COMMUNITY)
Admission: EM | Admit: 2019-09-17 | Discharge: 2019-09-18 | Disposition: A | Discharge status: Home / Self Care | Payer: Medicare Other | Attending: Internal Medicine | Admitting: Internal Medicine

## 2019-09-17 ENCOUNTER — Emergency Department (HOSPITAL_COMMUNITY): Payer: Medicare Other

## 2019-09-17 ENCOUNTER — Encounter (HOSPITAL_COMMUNITY): Payer: Self-pay | Admitting: Emergency Medicine

## 2019-09-17 DIAGNOSIS — I639 Cerebral infarction, unspecified: Secondary | ICD-10-CM | POA: Diagnosis present

## 2019-09-17 DIAGNOSIS — Z823 Family history of stroke: Secondary | ICD-10-CM | POA: Insufficient documentation

## 2019-09-17 DIAGNOSIS — R4781 Slurred speech: Secondary | ICD-10-CM | POA: Insufficient documentation

## 2019-09-17 DIAGNOSIS — Z794 Long term (current) use of insulin: Secondary | ICD-10-CM | POA: Diagnosis not present

## 2019-09-17 DIAGNOSIS — R262 Difficulty in walking, not elsewhere classified: Secondary | ICD-10-CM | POA: Insufficient documentation

## 2019-09-17 DIAGNOSIS — Z8673 Personal history of transient ischemic attack (TIA), and cerebral infarction without residual deficits: Secondary | ICD-10-CM | POA: Insufficient documentation

## 2019-09-17 DIAGNOSIS — I63112 Cerebral infarction due to embolism of left vertebral artery: Principal | ICD-10-CM | POA: Insufficient documentation

## 2019-09-17 DIAGNOSIS — E1159 Type 2 diabetes mellitus with other circulatory complications: Secondary | ICD-10-CM | POA: Diagnosis present

## 2019-09-17 DIAGNOSIS — Z20822 Contact with and (suspected) exposure to covid-19: Secondary | ICD-10-CM | POA: Diagnosis not present

## 2019-09-17 DIAGNOSIS — E785 Hyperlipidemia, unspecified: Secondary | ICD-10-CM | POA: Diagnosis not present

## 2019-09-17 DIAGNOSIS — Z87442 Personal history of urinary calculi: Secondary | ICD-10-CM | POA: Diagnosis not present

## 2019-09-17 DIAGNOSIS — Z7982 Long term (current) use of aspirin: Secondary | ICD-10-CM | POA: Diagnosis not present

## 2019-09-17 DIAGNOSIS — Z8711 Personal history of peptic ulcer disease: Secondary | ICD-10-CM | POA: Insufficient documentation

## 2019-09-17 DIAGNOSIS — R202 Paresthesia of skin: Secondary | ICD-10-CM | POA: Insufficient documentation

## 2019-09-17 DIAGNOSIS — I63212 Cerebral infarction due to unspecified occlusion or stenosis of left vertebral arteries: Secondary | ICD-10-CM

## 2019-09-17 DIAGNOSIS — Z79899 Other long term (current) drug therapy: Secondary | ICD-10-CM | POA: Insufficient documentation

## 2019-09-17 DIAGNOSIS — Z8249 Family history of ischemic heart disease and other diseases of the circulatory system: Secondary | ICD-10-CM | POA: Diagnosis not present

## 2019-09-17 DIAGNOSIS — I10 Essential (primary) hypertension: Secondary | ICD-10-CM | POA: Diagnosis not present

## 2019-09-17 DIAGNOSIS — R519 Headache, unspecified: Secondary | ICD-10-CM | POA: Diagnosis not present

## 2019-09-17 DIAGNOSIS — R42 Dizziness and giddiness: Secondary | ICD-10-CM | POA: Diagnosis present

## 2019-09-17 DIAGNOSIS — Z833 Family history of diabetes mellitus: Secondary | ICD-10-CM | POA: Insufficient documentation

## 2019-09-17 DIAGNOSIS — Z7902 Long term (current) use of antithrombotics/antiplatelets: Secondary | ICD-10-CM | POA: Diagnosis not present

## 2019-09-17 DIAGNOSIS — D649 Anemia, unspecified: Secondary | ICD-10-CM | POA: Diagnosis not present

## 2019-09-17 DIAGNOSIS — I6381 Other cerebral infarction due to occlusion or stenosis of small artery: Secondary | ICD-10-CM

## 2019-09-17 LAB — COMPREHENSIVE METABOLIC PANEL
ALT: 20 U/L (ref 0–44)
AST: 19 U/L (ref 15–41)
Albumin: 4 g/dL (ref 3.5–5.0)
Alkaline Phosphatase: 53 U/L (ref 38–126)
Anion gap: 11 (ref 5–15)
BUN: 14 mg/dL (ref 8–23)
CO2: 21 mmol/L — ABNORMAL LOW (ref 22–32)
Calcium: 9.1 mg/dL (ref 8.9–10.3)
Chloride: 104 mmol/L (ref 98–111)
Creatinine, Ser: 1.02 mg/dL (ref 0.61–1.24)
GFR calc Af Amer: >60 mL/min (ref >60)
GFR calc non Af Amer: >60 mL/min (ref >60)
Glucose, Bld: 231 mg/dL — ABNORMAL HIGH (ref 70–99)
Potassium: 4.4 mmol/L (ref 3.5–5.1)
Sodium: 136 mmol/L (ref 135–145)
Total Bilirubin: 0.7 mg/dL (ref 0.3–1.2)
Total Protein: 8 g/dL (ref 6.5–8.1)

## 2019-09-17 LAB — CBC
HCT: 42.7 % (ref 39.0–52.0)
HCT: 42.8 % (ref 39.0–52.0)
Hemoglobin: 12.7 g/dL — ABNORMAL LOW (ref 13.0–17.0)
Hemoglobin: 12.9 g/dL — ABNORMAL LOW (ref 13.0–17.0)
MCH: 24 pg — ABNORMAL LOW (ref 26.0–34.0)
MCH: 23.9 pg — ABNORMAL LOW (ref 26.0–34.0)
MCHC: 29.7 g/dL — ABNORMAL LOW (ref 30.0–36.0)
MCHC: 30.1 g/dL (ref 30.0–36.0)
MCV: 80.7 fL (ref 80.0–100.0)
MCV: 79.4 fL — ABNORMAL LOW (ref 80.0–100.0)
Platelets: 286 10*3/uL (ref 150–400)
Platelets: 303 10*3/uL (ref 150–400)
RBC: 5.29 MIL/uL (ref 4.22–5.81)
RBC: 5.39 MIL/uL (ref 4.22–5.81)
RDW: 16.7 % — ABNORMAL HIGH (ref 11.5–15.5)
RDW: 16.5 % — ABNORMAL HIGH (ref 11.5–15.5)
WBC: 7.3 10*3/uL (ref 4.0–10.5)
WBC: 5.9 10*3/uL (ref 4.0–10.5)
nRBC: 0 % (ref 0.0–0.2)
nRBC: 0 % (ref 0.0–0.2)

## 2019-09-17 LAB — APTT: aPTT: 32 seconds (ref 24–36)

## 2019-09-17 LAB — DIFFERENTIAL
Abs Immature Granulocytes: 0.02 10*3/uL (ref 0.00–0.07)
Basophils Absolute: 0.1 10*3/uL (ref 0.0–0.1)
Basophils Relative: 1 %
Eosinophils Absolute: 0.3 10*3/uL (ref 0.0–0.5)
Eosinophils Relative: 3 %
Immature Granulocytes: 0 %
Lymphocytes Relative: 16 %
Lymphs Abs: 1.2 10*3/uL (ref 0.7–4.0)
Monocytes Absolute: 0.4 10*3/uL (ref 0.1–1.0)
Monocytes Relative: 5 %
Neutro Abs: 5.5 10*3/uL (ref 1.7–7.7)
Neutrophils Relative %: 75 %

## 2019-09-17 LAB — RESPIRATORY PANEL BY RT PCR (FLU A&B, COVID)
Influenza A by PCR: NEGATIVE
Influenza B by PCR: NEGATIVE
SARS Coronavirus 2 by RT PCR: NEGATIVE

## 2019-09-17 LAB — I-STAT CHEM 8, ED
BUN: 15 mg/dL (ref 8–23)
Calcium, Ion: 1.21 mmol/L (ref 1.15–1.40)
Chloride: 104 mmol/L (ref 98–111)
Creatinine, Ser: 0.9 mg/dL (ref 0.61–1.24)
Glucose, Bld: 222 mg/dL — ABNORMAL HIGH (ref 70–99)
HCT: 44 % (ref 39.0–52.0)
Hemoglobin: 15 g/dL (ref 13.0–17.0)
Potassium: 4.5 mmol/L (ref 3.5–5.1)
Sodium: 140 mmol/L (ref 135–145)
TCO2: 23 mmol/L (ref 22–32)

## 2019-09-17 LAB — CREATININE, SERUM
Creatinine, Ser: 0.84 mg/dL (ref 0.61–1.24)
GFR calc Af Amer: >60 mL/min (ref >60)
GFR calc non Af Amer: >60 mL/min (ref >60)

## 2019-09-17 LAB — PROTIME-INR
INR: 1 (ref 0.8–1.2)
Prothrombin Time: 12.6 seconds (ref 11.4–15.2)

## 2019-09-17 LAB — CBG MONITORING, ED: Glucose-Capillary: 103 mg/dL — ABNORMAL HIGH (ref 70–99)

## 2019-09-17 LAB — GLUCOSE, CAPILLARY: Glucose-Capillary: 117 mg/dL — ABNORMAL HIGH (ref 70–99)

## 2019-09-17 MED ORDER — SODIUM CHLORIDE 0.9 % IV SOLN: INTRAVENOUS | Status: DC

## 2019-09-17 MED ORDER — ACETAMINOPHEN 650 MG RE SUPP: 650.0000 mg | RECTAL | Status: DC | PRN

## 2019-09-17 MED ORDER — INSULIN ASPART 100 UNIT/ML ~~LOC~~ SOLN
0.0000 [IU] | Freq: Three times a day (TID) | SUBCUTANEOUS | Status: DC
  Administered 2019-09-18: 3 [IU] via SUBCUTANEOUS
  Administered 2019-09-18: 2 [IU] via SUBCUTANEOUS

## 2019-09-17 MED ORDER — ENOXAPARIN SODIUM 40 MG/0.4ML ~~LOC~~ SOLN
40.0000 mg | SUBCUTANEOUS | Status: DC
  Administered 2019-09-17: 21:00:00 40 mg via SUBCUTANEOUS
  Filled 2019-09-17: qty 0.4

## 2019-09-17 MED ORDER — CLOPIDOGREL BISULFATE 75 MG PO TABS
75.0000 mg | ORAL_TABLET | Freq: Every day | ORAL | Status: DC
  Administered 2019-09-18: 10:00:00 75 mg via ORAL
  Filled 2019-09-17: qty 1

## 2019-09-17 MED ORDER — ACETAMINOPHEN 325 MG PO TABS: 650.0000 mg | ORAL_TABLET | ORAL | Status: DC | PRN

## 2019-09-17 MED ORDER — LIDOCAINE 5 % EX PTCH
1.0000 | MEDICATED_PATCH | CUTANEOUS | Status: DC
  Filled 2019-09-17: qty 1

## 2019-09-17 MED ORDER — ATORVASTATIN CALCIUM 40 MG PO TABS: 40.0000 mg | ORAL_TABLET | Freq: Every day | ORAL | Status: DC

## 2019-09-17 MED ORDER — ASPIRIN 325 MG PO TABS
325.0000 mg | ORAL_TABLET | Freq: Every day | ORAL | Status: DC
  Administered 2019-09-17: 325 mg via ORAL
  Filled 2019-09-17: qty 1

## 2019-09-17 MED ORDER — STROKE: EARLY STAGES OF RECOVERY BOOK
Freq: Once | Status: DC
  Filled 2019-09-17: qty 1

## 2019-09-17 MED ORDER — IOHEXOL 350 MG/ML SOLN
75.0000 mL | Freq: Once | INTRAVENOUS | Status: AC | PRN
  Administered 2019-09-17: 75 mL via INTRAVENOUS

## 2019-09-17 MED ORDER — SODIUM CHLORIDE 0.9% FLUSH: 3.0000 mL | Freq: Once | INTRAVENOUS | Status: DC

## 2019-09-17 MED ORDER — ATORVASTATIN CALCIUM 80 MG PO TABS: 80.0000 mg | ORAL_TABLET | Freq: Every day | ORAL | Status: DC

## 2019-09-17 MED ORDER — ACETAMINOPHEN 160 MG/5ML PO SOLN: 650.0000 mg | ORAL | Status: DC | PRN

## 2019-09-17 MED ORDER — HYDRALAZINE HCL 20 MG/ML IJ SOLN: 10.0000 mg | INTRAMUSCULAR | Status: DC | PRN

## 2019-09-17 NOTE — Consult Note (Signed)
Neurology Consultation  Reason for Consult: Right-sided numbness and weakness Referring Physician: Dr. Hal Hope  CC: Right-sided weakness and numbness, lacunar infarct in the left thalamus on the MRI  History is obtained from: Patient, chart  HPI: Jon [name] is a 64 y.o. male past medical history of a prior stroke involving the right side with no residual deficits in 2018, diabetes, hypertension, hyperlipidemia, presented to the emergency room for evaluation of persistent right-sided numbness and weakness that started suddenly upon waking up Monday morning 09/14/2019.  His last known normal was when he went to bed sometime in the night of 09/13/2019.  Upon waking up on Monday morning, noticed that the right side appeared weaker and numb.  He was having difficulty walking and doing fine motor tasks with his right hand. He went to his outside primary care, given education about carpal tunnel and exercises and sent home.  Symptoms did not improve and did involve both the arm and the leg and that is what made him come to the emergency room today. An MRI done today revealed a lacunar infarct in the left thalamus. He denies any headaches.  He denies any visual changes.  He reports some dizziness that happened with these symptoms undistorted but has no dizziness at this time. No chest pain shortness of breath.  No fever chills. 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 Diabetes mellitus without complication ([place])    type II  . History of kidney stones   . Hyperlipidemia   . Hypertension   . Kidney stones   . Stomach ulcer   . Stroke [facility])    Dec. 14 2018    Family History  Problem Relation Age of Onset  . Diabetes Father   . Stroke Father   . Hypertension Father   . Hypertension Mother    . Diabetes Sister   . Diabetes Son   . Diabetes Daughter     Social History:   reports that he has never smoked. He has never used smokeless tobacco. He reports current alcohol use of about 1.0 - 2.0 standard drinks of alcohol per week. He reports that he does not use drugs.  Medications  Current Facility-Administered Medications:  .   stroke: mapping our early stages of recovery book, , Does not apply, Once, Rise Patience, MD .  0.9 %  sodium chloride infusion, , Intravenous, Continuous, Rise Patience, MD .  acetaminophen (TYLENOL) tablet 650 mg, 650 mg, Oral, Q4H PRN **OR** acetaminophen (TYLENOL) 160 MG/5ML solution 650 mg, 650 mg, Per Tube, Q4H PRN **OR** acetaminophen (TYLENOL) suppository 650 mg, 650 mg, Rectal, Q4H PRN, Rise Patience, MD .  atorvastatin (LIPITOR) tablet 40 mg, 40 mg, Oral, Daily, Rise Patience, MD .  Derrill Memo ON 09/18/2019] clopidogrel (PLAVIX) tablet 75 mg, 75 mg, Oral, Daily, Rise Patience, MD .  enoxaparin (LOVENOX) injection 40 mg, 40 mg, Subcutaneous, Q24H, Rise Patience, MD .  hydrALAZINE (APRESOLINE) injection 10 mg, 10 mg, Intravenous, Q4H PRN, Rise Patience, MD .  Derrill Memo ON 09/18/2019] insulin aspart (novoLOG) injection 0-9 Units, 0-9 Units, Subcutaneous, TID WC, [place], Doreatha Lew, MD .  lidocaine (LIDODERM) 5 % 1 patch, 1 patch, Transdermal, Q24H, Gean Birchwood N, MD .  sodium chloride flush (NS) 0.9 % injection 3 mL,  3 mL, Intravenous, Once, Rise Patience, MD, Stopped at 09/17/19 1708  Current Outpatient Medications:  .  acetaminophen-codeine (TYLENOL #3) 300-30 MG tablet, Take 1-2 tablets by mouth every 8 (eight) hours as needed for moderate pain or severe pain., Disp: 60 tablet, Rfl: 0 .  amLODipine (NORVASC) 10 MG tablet, TAKE 1 TABLET BY MOUTH  DAILY, Disp: 90 tablet, Rfl: 3 .  atorvastatin (LIPITOR) 40 MG tablet, TAKE 1 TABLET BY MOUTH  DAILY, Disp: 90 tablet, Rfl: 3 .  Blood Glucose  Monitoring Suppl (ONETOUCH VERIO) w/Device KIT, Use to check blood sugar daily., Disp: 1 kit, Rfl: 0 .  clopidogrel (PLAVIX) 75 MG tablet, TAKE 1 TABLET BY MOUTH  DAILY, Disp: 90 tablet, Rfl: 3 .  Continuous Blood Gluc Receiver (FREESTYLE LIBRE 14 DAY READER) DEVI, 1 Device by Does not apply route daily., Disp: 1 each, Rfl: PRN .  Continuous Blood Gluc Receiver (FREESTYLE LIBRE 2 READER SYSTM) DEVI, 1 Device by Does not apply route 3 (three) times daily. E11.65, Disp: 1 each, Rfl: 0 .  Continuous Blood Gluc Sensor (FREESTYLE LIBRE 14 DAY SENSOR) MISC, 1 Dose by Does not apply route daily., Disp: 1 each, Rfl: PRN .  Dulaglutide (TRULICITY) 3.42 AJ/6.8TL SOPN, INJECT SUBCUTANEOUSLY 0.75  MG ONCE WEEKLY, Disp: 6 mL, Rfl: 0 .  glimepiride (AMARYL) 4 MG tablet, TAKE 1 TABLET BY MOUTH  DAILY WITH BREAKFAST, Disp: 90 tablet, Rfl: 3 .  glucose blood (ONETOUCH VERIO) test strip, Use as instructed to check blood sugar daily., Disp: 100 each, Rfl: 3 .  Insulin Pen Needle (B-D UF III MINI PEN NEEDLES) 31G X 5 MM MISC, Use as instructed. Inject into the skin once daily., Disp: 100 each, Rfl: 3 .  lidocaine (LIDODERM) 5 %, Place 1 patch onto the skin daily., Disp: 30 patch, Rfl: 0 .  losartan (COZAAR) 50 MG tablet, TAKE 1 TABLET BY MOUTH  DAILY, Disp: 90 tablet, Rfl: 0 .  metFORMIN (GLUCOPHAGE) 1000 MG tablet, Take 1 tablet (1,000 mg total) by mouth 2 (two) times daily with a meal., Disp: 180 tablet, Rfl: 1 .  OneTouch Delica Lancets 57W MISC, Use as instructed to check blood sugar once daily., Disp: 100 each, Rfl: 3 .  tiZANidine (ZANAFLEX) 4 MG tablet, Take 1 tablet (4 mg total) by mouth every 6 (six) hours as needed for muscle spasms. (Patient not taking: Reported on 09/14/2019), Disp: 30 tablet, Rfl: 0   Exam: Current vital signs: BP (!) 142/83   Pulse 76   Temp 97.7 F (36.5 C) (Oral)   Resp 19   SpO2 98%  Vital signs in last 24 hours: Temp:  [97.7 F (36.5 C)] 97.7 F (36.5 C) (05/06 1205) Pulse  Rate:  [76-108] 76 (05/06 1945) Resp:  [13-19] 19 (05/06 1945) BP: (139-166)/(79-92) 142/83 (05/06 1945) SpO2:  [97 %-98 %] 98 % (05/06 1945)  GENERAL: Awake, alert in NAD HEENT: - Normocephalic and atraumatic, dry mm, no LN++, no Thyromegally LUNGS - Clear to auscultation bilaterally with no wheezes CV - S1S2 RRR, no m/r/g, equal pulses bilaterally. ABDOMEN - Soft, nontender, nondistended with normoactive BS Ext: warm, well perfused, intact peripheral pulses, no edema  NEURO:  Mental Status: AA&Ox3  Language: speech is clear and not dysarthric, no evidence of aphasia.  Naming, repetition, fluency, and comprehension intact. Cranial Nerves: PERRL EOMI, visual fields full, no facial asymmetry, facial sensation intact, hearing intact, tongue/uvula/soft palate midline, normal sternocleidomastoid and trapezius muscle strength. No evidence of tongue atrophy or fibrillations  Motor: 5/5 with subjective reported weakness of the right arm and leg but I could not demonstrate on my exam. Tone: is normal and bulk is normal Sensation-decreased sensation on the right arm and leg compared to the left.  No extinction on double simultaneous stimulation Coordination: FTN intact bilaterally, no ataxia in BLE. Gait- deferred  NIHSS-1 for sensory   Labs I have reviewed labs in epic and the results pertinent to this consultation are:  CBC    Component Value Date/Time   WBC 7.3 09/17/2019 1238   RBC 5.29 09/17/2019 1238   HGB 15.0 09/17/2019 1302   HGB 12.7 (L) 09/14/2019 1619   HCT 44.0 09/17/2019 1302   HCT 40.6 09/14/2019 1619   PLT 286 09/17/2019 1238   PLT 292 09/14/2019 1619   MCV 80.7 09/17/2019 1238   MCV 77 (L) 09/14/2019 1619   MCH 24.0 (L) 09/17/2019 1238   MCHC 29.7 (L) 09/17/2019 1238   RDW 16.7 (H) 09/17/2019 1238   RDW 16.2 (H) 09/14/2019 1619   LYMPHSABS 1.2 09/17/2019 1238   LYMPHSABS 1.4 09/14/2019 1619   MONOABS 0.4 09/17/2019 1238   EOSABS 0.3 09/17/2019 1238   EOSABS 0.2  09/14/2019 1619   BASOSABS 0.1 09/17/2019 1238   BASOSABS 0.0 09/14/2019 1619    CMP     Component Value Date/Time   NA 140 09/17/2019 1302   NA 141 08/24/2019 1128   K 4.5 09/17/2019 1302   CL 104 09/17/2019 1302   CO2 21 (L) 09/17/2019 1238   GLUCOSE 222 (H) 09/17/2019 1302   BUN 15 09/17/2019 1302   BUN 16 08/24/2019 1128   CREATININE 0.90 09/17/2019 1302   CALCIUM 9.1 09/17/2019 1238   PROT 8.0 09/17/2019 1238   PROT 7.9 08/24/2019 1128   ALBUMIN 4.0 09/17/2019 1238   ALBUMIN 4.8 08/24/2019 1128   AST 19 09/17/2019 1238   ALT 20 09/17/2019 1238   ALKPHOS 53 09/17/2019 1238   BILITOT 0.7 09/17/2019 1238   BILITOT 0.4 08/24/2019 1128   GFRNONAA >60 09/17/2019 1238   GFRAA >60 09/17/2019 1238    Lipid Panel     Component Value Date/Time   CHOL 156 08/24/2019 1128   TRIG 86 08/24/2019 1128   HDL 41 08/24/2019 1128   CHOLHDL 3.8 08/24/2019 1128   CHOLHDL 4.4 04/27/2017 0531   VLDL 22 04/27/2017 0531   LDLCALC 99 08/24/2019 1128   Lipid panel done on 08/24/2019 with LDL 99  Imaging I have reviewed the images obtained-MRI brain with and acute left thalamic lacunar infarct, moderate chronic small vessel disease.  Chronic small vessel infarcts  Assessment: 64 year old man with past medical history of diabetes, hypertension, hyperlipidemia and a prior stroke in 2018 with no residual deficits presents for evaluation of 3 to 4 days worth of right-sided numbness and weakness. MRI brain with the left thalamic lacunar infarct. Likely small vessel etiology Currently on Plavix at home.  Was never on dual antiplatelets.  Impression: Acute ischemic stroke-small vessel etiology  Recommendations: Admit to hospitalist Frequent neurochecks Telemetry CTA head and neck On Plavix at home.  Add aspirin 325 to the regimen.  Duration to be determined after work-up completion and stroke team rounding. High intensity statin-on Lipitor 40-increase to Lipitor 80-goal LDL less than  70. Last LDL was 99-done less than 1 month ago.  Canceled the order for repeat lipid panel for now. 2D echo A1c PT OT next speech therapy Passed bedside swallow evaluation-diet ordered by primary team.  Discussed with Dr.  Hal Hope, hospitalist.  Discussed with the patient and answered all his questions.  Stroke team will follow with you. -- Amie Portland, MD [facility] Pager: [phone] If 7pm to 7am, please call on call as listed on AMION.

## 2019-09-17 NOTE — H&P (Signed)
History and Physical    Jon [name] PPJ:[id number] DOB: March 17, 1956 DOA: 09/17/2019  PCP: Gildardo Pounds, NP  Patient coming from: Home.  Chief Complaint: Right upper extremity weakness numbness.  HPI: Jon [name] is a 64 y.o. male with history of previous stroke hypertension diabetes mellitus hyperlipidemia presents to the ER because of persistent right upper extremity numbness weakness since May 3 4 days ago.  Patient woke up on May 3 with right upper extremity weakness and numbness and some slurred speech.  Had gone to his PCP and was given education about carpal tunnel syndrome.  Since symptoms persisted patient comes to the ER.  Patient states he also has been having difficulty with ambulation.  Did not lose consciousness or fall.  Did not have any difficulty with swallowing or any visual symptoms.  Denies any weakness on the left side.  Patient has had a previous stroke in 2018 which had left patient with no visual symptoms.  ED Course: In the ER patient on exam has mild right upper extremity weakness with slurred speech passed swallow.  MRI brain shows left thalamic infarct.  EKG shows normal sinus rhythm.  Labs show hemoglobin of 12.7 blood glucose of 231 Covid test was negative.  Patient admitted for further management of acute CVA.  Review of Systems: As per HPI, rest all negative.   Past Medical History:  Diagnosis Date   Diabetes mellitus without complication ([place])    type II   History of kidney stones    Hyperlipidemia    Hypertension    Kidney stones    Stomach ulcer    Stroke [facility])    Dec. 14 2018    Past Surgical History:  Procedure Laterality Date   CYSTOSCOPY Left 12/10/2016   Procedure: cystoscopy with left ureteral stone extraction ;  Surgeon: Kathie Rhodes, MD;  Location: [facility];  Service: Urology;  Laterality: Left;   HERNIA REPAIR     Stomach ulcer       reports that he has never smoked. He has never used smokeless tobacco. He reports current  alcohol use of about 1.0 - 2.0 standard drinks of alcohol per week. He reports that he does not use drugs.  No Known Allergies  Family History  Problem Relation Age of Onset   Diabetes Father    Stroke Father    Hypertension Father    Hypertension Mother    Diabetes Sister    Diabetes Son    Diabetes Daughter     Prior to Admission medications   Medication Sig Start Date End Date Taking? Authorizing Provider  acetaminophen-codeine (TYLENOL #3) 300-30 MG tablet Take 1-2 tablets by mouth every 8 (eight) hours as needed for moderate pain or severe pain. 08/24/19   Gildardo Pounds, NP  amLODipine (NORVASC) 10 MG tablet TAKE 1 TABLET BY MOUTH  DAILY 02/09/19   Gildardo Pounds, NP  atorvastatin (LIPITOR) 40 MG tablet TAKE 1 TABLET BY MOUTH  DAILY 02/09/19   Gildardo Pounds, NP  Blood Glucose Monitoring Suppl (ONETOUCH VERIO) w/Device KIT Use to check blood sugar daily. 02/05/19   Charlott Rakes, MD  clopidogrel (PLAVIX) 75 MG tablet TAKE 1 TABLET BY MOUTH  DAILY 02/09/19   Charlott Rakes, MD  Continuous Blood Gluc Receiver (FREESTYLE LIBRE 14 DAY READER) DEVI 1 Device by Does not apply route daily. 06/07/19   Gildardo Pounds, NP  Continuous Blood Gluc Receiver (FREESTYLE LIBRE 2 READER SYSTM) DEVI 1 Device by Does not apply route 3 (  three) times daily. E11.65 05/31/19   Gildardo Pounds, NP  Continuous Blood Gluc Sensor (FREESTYLE LIBRE 14 DAY SENSOR) MISC 1 Dose by Does not apply route daily. 06/07/19   Gildardo Pounds, NP  Dulaglutide (TRULICITY) 8.01 KP/5.3ZS SOPN INJECT SUBCUTANEOUSLY 0.75  MG ONCE WEEKLY 08/03/19   Gildardo Pounds, NP  glimepiride (AMARYL) 4 MG tablet TAKE 1 TABLET BY MOUTH  DAILY WITH BREAKFAST 03/02/19   Charlott Rakes, MD  glucose blood (ONETOUCH VERIO) test strip Use as instructed to check blood sugar daily. 02/05/19   Charlott Rakes, MD  Insulin Pen Needle (B-D UF III MINI PEN NEEDLES) 31G X 5 MM MISC Use as instructed. Inject into the skin once daily. 03/06/19    Gildardo Pounds, NP  lidocaine (LIDODERM) 5 % Place 1 patch onto the skin daily. 08/17/19   Gildardo Pounds, NP  losartan (COZAAR) 50 MG tablet TAKE 1 TABLET BY MOUTH  DAILY 07/29/19   Charlott Rakes, MD  metFORMIN (GLUCOPHAGE) 1000 MG tablet Take 1 tablet (1,000 mg total) by mouth 2 (two) times daily with a meal. 08/03/19 01/30/20  Gildardo Pounds, NP  OneTouch Delica Lancets 82L MISC Use as instructed to check blood sugar once daily. 02/05/19   Charlott Rakes, MD  tiZANidine (ZANAFLEX) 4 MG tablet Take 1 tablet (4 mg total) by mouth every 6 (six) hours as needed for muscle spasms. Patient not taking: Reported on 09/14/2019 08/17/19   Gildardo Pounds, NP    Physical Exam: Constitutional: Moderately built and nourished. Vitals:   09/17/19 1205 09/17/19 1517 09/17/19 1730 09/17/19 1945  BP: (!) 166/92 (!) 142/81 139/79 (!) 142/83  Pulse: (!) 108 (!) 103 84 76  Resp: '16 15 13 19  '$ Temp: 97.7 F (36.5 C)     TempSrc: Oral     SpO2: 97% 97% 97% 98%   Eyes: Anicteric no pallor. ENMT: No discharge from the ears eyes nose or mouth. Neck: No mass felt.  No neck rigidity. Respiratory: No rhonchi or crepitations. Cardiovascular: S1-S2 heard. Abdomen: Soft nontender bowel sounds present. Musculoskeletal: No edema.  No joint effusion. Skin: No rash. Neurologic: Alert awake oriented to time place and person.  Has mild weakness of the right upper extremity.  Otherwise moves all other extremities 5 x 5.  No pronator drift.  No facial asymmetry.  Tongue is midline.  Pupils equal reacting to light. Psychiatric: Appears normal.   Labs on Admission: I have personally reviewed following labs and imaging studies  CBC: Recent Labs  Lab 09/14/19 1619 09/17/19 1238 09/17/19 1302  WBC 4.9 7.3  --   NEUTROABS 2.9 5.5  --   HGB 12.7* 12.7* 15.0  HCT 40.6 42.7 44.0  MCV 77* 80.7  --   PLT 292 286  --    Basic Metabolic Panel: Recent Labs  Lab 09/17/19 1238 09/17/19 1302  NA 136 140  K 4.4 4.5    CL 104 104  CO2 21*  --   GLUCOSE 231* 222*  BUN 14 15  CREATININE 1.02 0.90  CALCIUM 9.1  --    GFR: Estimated Creatinine Clearance: 87.2 mL/min (by C-G formula based on SCr of 0.9 mg/dL). Liver Function Tests: Recent Labs  Lab 09/17/19 1238  AST 19  ALT 20  ALKPHOS 53  BILITOT 0.7  PROT 8.0  ALBUMIN 4.0   No results for input(s): LIPASE, AMYLASE in the last 168 hours. No results for input(s): AMMONIA in the last 168 hours. Coagulation Profile: Recent  Labs  Lab 09/17/19 1238  INR 1.0   Cardiac Enzymes: No results for input(s): CKTOTAL, CKMB, CKMBINDEX, TROPONINI in the last 168 hours. BNP (last 3 results) No results for input(s): PROBNP in the last 8760 hours. HbA1C: No results for input(s): HGBA1C in the last 72 hours. CBG: Recent Labs  Lab 09/17/19 1717  GLUCAP 103*   Lipid Profile: No results for input(s): CHOL, HDL, LDLCALC, TRIG, CHOLHDL, LDLDIRECT in the last 72 hours. Thyroid Function Tests: No results for input(s): TSH, T4TOTAL, FREET4, T3FREE, THYROIDAB in the last 72 hours. Anemia Panel: No results for input(s): VITAMINB12, FOLATE, FERRITIN, TIBC, IRON, RETICCTPCT in the last 72 hours. Urine analysis:    Component Value Date/Time   COLORURINE YELLOW 04/26/2017 0913   APPEARANCEUR CLEAR 04/26/2017 0913   LABSPEC 1.016 04/26/2017 0913   PHURINE 6.0 04/26/2017 0913   GLUCOSEU >=500 (A) 04/26/2017 0913   HGBUR NEGATIVE 04/26/2017 0913   BILIRUBINUR NEGATIVE 04/26/2017 0913   KETONESUR NEGATIVE 04/26/2017 0913   PROTEINUR NEGATIVE 04/26/2017 0913   NITRITE NEGATIVE 04/26/2017 0913   LEUKOCYTESUR NEGATIVE 04/26/2017 0913   Sepsis Labs: '@LABRCNTIP'$ (procalcitonin:4,lacticidven:4) ) Recent Results (from the past 240 hour(s))  Respiratory Panel by RT PCR (Flu A&B, Covid) - Nasopharyngeal Swab     Status: None   Collection Time: 09/17/19  6:30 PM   Specimen: Nasopharyngeal Swab  Result Value Ref Range Status   SARS Coronavirus 2 by RT PCR NEGATIVE  NEGATIVE Final    Comment: (NOTE) SARS-CoV-2 target nucleic acids are NOT DETECTED. The SARS-CoV-2 RNA is generally detectable in upper respiratoy specimens during the acute phase of infection. The lowest concentration of SARS-CoV-2 viral copies this assay can detect is 131 copies/mL. A negative result does not preclude SARS-Cov-2 infection and should not be used as the sole basis for treatment or other patient management decisions. A negative result may occur with  improper specimen collection/handling, submission of specimen other than nasopharyngeal swab, presence of viral mutation(s) within the areas targeted by this assay, and inadequate number of viral copies (<131 copies/mL). A negative result must be combined with clinical observations, patient history, and epidemiological information. The expected result is Negative. Fact Sheet for Patients:  PinkCheek.be Fact Sheet for Healthcare Providers:  GravelBags.it This test is not yet ap proved or cleared by the [place] FDA and  has been authorized for detection and/or diagnosis of SARS-CoV-2 by FDA under an Emergency Use Authorization (EUA). This EUA will remain  in effect (meaning this test can be used) for the duration of the COVID-19 declaration under Section 564(b)(1) of the Act, 21 U.S.C. section 360bbb-3(b)(1), unless the authorization is terminated or revoked sooner.    Influenza A by PCR NEGATIVE NEGATIVE Final   Influenza B by PCR NEGATIVE NEGATIVE Final    Comment: (NOTE) The Xpert Xpress SARS-CoV-2/FLU/RSV assay is intended as an aid in  the diagnosis of influenza from Nasopharyngeal swab specimens and  should not be used as a sole basis for treatment. Nasal washings and  aspirates are unacceptable for Xpert Xpress SARS-CoV-2/FLU/RSV  testing. Fact Sheet for Patients: PinkCheek.be Fact Sheet for Healthcare  Providers: GravelBags.it This test is not yet approved or cleared by the [place] FDA and  has been authorized for detection and/or diagnosis of SARS-CoV-2 by  FDA under an Emergency Use Authorization (EUA). This EUA will remain  in effect (meaning this test can be used) for the duration of the  Covid-19 declaration under Section 564(b)(1) of the Act, 21  U.S.C. section  360bbb-3(b)(1), unless the authorization is  terminated or revoked. Performed at [facility], [place] [address] [place], [place] [address]      Radiological Exams on Admission: CT HEAD WO CONTRAST  Result Date: 09/17/2019 CLINICAL DATA:  Left side headache EXAM: CT HEAD WITHOUT CONTRAST TECHNIQUE: Contiguous axial images were obtained from the base of the skull through the vertex without intravenous contrast. COMPARISON:  2018 FINDINGS: Brain: There is no acute intracranial hemorrhage, mass effect, or edema. Preserved gray-white differentiation. There are chronic small vessel infarcts of the left thalamus, lentiform nucleus, and adjacent white matter. Age-indeterminate small vessel infarct of the left caudate. Additional patchy hypoattenuation in the supratentorial white matter is nonspecific but probably reflects mild to moderate chronic microvascular ischemic changes. There is no extra-axial fluid collection. Ventricles and sulci are within normal limits in size and configuration. Vascular: No hyperdense vessel or unexpected calcification. Skull: Calvarium is unremarkable. Sinuses/Orbits: Polypoid mucosal thickening primarily involving the ethmoid and left maxillary sinuses. Unremarkable orbits. Other: Mastoid air cells are clear. IMPRESSION: No acute intracranial hemorrhage or mass effect. Age-indeterminate small vessel infarct of the left caudate. Chronic small vessel infarcts of the left thalamus, lentiform nucleus, and adjacent white matter. Mild to moderate chronic microvascular ischemic  changes. Fall Electronically Signed   By: Macy Mis M.D.   On: 09/17/2019 14:34   MR BRAIN WO CONTRAST  Result Date: 09/17/2019 CLINICAL DATA:  Sensory loss right hand, left headache, right facial tightness EXAM: MRI HEAD WITHOUT CONTRAST TECHNIQUE: Multiplanar, multiecho pulse sequences of the brain and surrounding structures were obtained without intravenous contrast. COMPARISON:  None. FINDINGS: Motion artifact is present. Brain: Subcentimeter focus of reduced diffusion involving the left thalamus. Patchy and confluent areas of T2 hyperintensity in the supratentorial white matter nonspecific but probably reflect moderate chronic microvascular ischemic changes. There are chronic small vessel infarcts of the thalamus and basal ganglia on the left as well as bilateral cerebellar hemispheres. No evidence of intracranial hemorrhage. There is no intracranial mass, mass effect, or edema. There is no hydrocephalus or extra-axial fluid collection. Vascular: Major vessel flow voids at the skull base are preserved. Skull and upper cervical spine: Normal marrow signal is preserved. Sinuses/Orbits: Moderate paranasal sinus mucosal thickening. Orbits are unremarkable. Other: Sella is unremarkable.  Mastoid air cells are clear. IMPRESSION: Small acute infarct of the left thalamus. Moderate chronic microvascular ischemic changes. Chronic small vessel infarcts as detailed above. Electronically Signed   By: Macy Mis M.D.   On: 09/17/2019 17:37    EKG: Independently reviewed.  Normal sinus rhythm.  Assessment/Plan Principal Problem:   Acute CVA (cerebrovascular accident) ([place]) Active Problems:   HTN (hypertension)   Type 2 diabetes mellitus with vascular disease ([place])    1. Acute CVA -discussed with on-call neurologist Dr. Demetra Shiner will be seeing patient in consult.  Neurologist recommended getting CT angiogram of the head and neck 2D echo neurochecks check hemoglobin A1c lipid panel patient is on  Plavix aspirin has been added Lipitor was increased from 40-80. 2. Hypertension we will allow for permissive hypertension and keep patient on as needed IV hydralazine for now for systolic more than 098 and diastolic more than 119 and holding home medications for now. 3. Diabetes mellitus type 2 check hemoglobin A1c and sliding scale for now. 4. Normocytic normochromic anemia check anemia panel with next blood draw. 5. Hyperlipidemia on statins.  Check lipid panel.  Statin dose has been increased.   DVT prophylaxis: Lovenox. Code Status: Full code.  Family Communication: Discussed with patient. Disposition Plan: Home. Consults called: Neurology. Admission status: Observation.   Rise Patience MD [facility] Pager [phone].  If 7PM-7AM, please contact night-coverage [email or web address] Password [email or web address]  09/17/2019, 8:01 PM

## 2019-09-17 NOTE — Progress Notes (Signed)
Pt settled in room. BS checked. Bed alarm on and call bell in reach. Pt oriented to room. Tele placed.

## 2019-09-17 NOTE — ED Provider Notes (Signed)
[facility] EMERGENCY DEPARTMENT Provider Note   CSN: [id number] Arrival date & time: 09/17/19  1201     History Chief Complaint  Patient presents with  . Stroke Symptoms    Bleu Minerd is a 64 y.o. male.  The history is provided by the patient and medical records. No language interpreter was used.     64 year old male with history of prior stroke currently on Plavix, hypertension, dyslipidemia, diabetes, presenting to the ER for evaluation of stroke symptoms.  Patient report 5 days ago he woke up with a sensation of pain on the left side of his head with pressure on the right side of his head, tinnitus sensation to the fingertips on his right hand, feeling imbalance, rightward leaning when he walks and report feeling dizzy.  Headache is throbbing, mild in severity.  No fever, diplopia, loss of vision, slurred speech, confusion, neck pain, chest pain, abdominal pain, nausea, vomiting, dysuria, focal weakness.  He was concern for potential stroke, was seen by his PCP on the same day but states he was not happy with the evaluation.  Patient mention his symptoms still persist and thus prompting this ER visit for further aeration.  He has been compliant with his medication including Plavix.  He denies tobacco use, states he is only an occasional social drinker.  His last stroke was 3 years ago.  He is up-to-date with Covid vaccination.  Past Medical History:  Diagnosis Date  . Diabetes mellitus without complication ([place])    type II  . History of kidney stones   . Hyperlipidemia   . Hypertension   . Kidney stones   . Stomach ulcer   . Stroke [facility])    Dec. 14 2018    Patient Active Problem List   Diagnosis Date Noted  . CVA (cerebral vascular accident) ([place]) 04/26/2017  . Routine general medical examination at a health care facility 06/04/2016  . Trigger finger, acquired 02/24/2016  . Erectile dysfunction 03/17/2015  . Dyslipidemia 03/12/2015  . HTN  (hypertension) 03/12/2015    Past Surgical History:  Procedure Laterality Date  . CYSTOSCOPY Left 12/10/2016   Procedure: cystoscopy with left ureteral stone extraction ;  Surgeon: Kathie Rhodes, MD;  Location: [facility];  Service: Urology;  Laterality: Left;  . HERNIA REPAIR    . Stomach ulcer         Family History  Problem Relation Age of Onset  . Diabetes Father   . Stroke Father   . Hypertension Father   . Hypertension Mother   . Diabetes Sister   . Diabetes Son   . Diabetes Daughter     Social History   Tobacco Use  . Smoking status: Never Smoker  . Smokeless tobacco: Never Used  Substance Use Topics  . Alcohol use: Yes    Alcohol/week: 1.0 - 2.0 standard drinks    Types: 1 - 2 Glasses of wine per week    Comment: socially  . Drug use: No    Home Medications Prior to Admission medications   Medication Sig Start Date End Date Taking? Authorizing Provider  acetaminophen-codeine (TYLENOL #3) 300-30 MG tablet Take 1-2 tablets by mouth every 8 (eight) hours as needed for moderate pain or severe pain. 08/24/19   Gildardo Pounds, NP  amLODipine (NORVASC) 10 MG tablet TAKE 1 TABLET BY MOUTH  DAILY 02/09/19   Gildardo Pounds, NP  atorvastatin (LIPITOR) 40 MG tablet TAKE 1 TABLET BY MOUTH  DAILY 02/09/19   Raul Del,  Vernia Buff, NP  Blood Glucose Monitoring Suppl (ONETOUCH VERIO) w/Device KIT Use to check blood sugar daily. 02/05/19   Charlott Rakes, MD  clopidogrel (PLAVIX) 75 MG tablet TAKE 1 TABLET BY MOUTH  DAILY 02/09/19   Charlott Rakes, MD  Continuous Blood Gluc Receiver (FREESTYLE LIBRE 14 DAY READER) DEVI 1 Device by Does not apply route daily. 06/07/19   Gildardo Pounds, NP  Continuous Blood Gluc Receiver (FREESTYLE LIBRE 2 READER SYSTM) DEVI 1 Device by Does not apply route 3 (three) times daily. E11.65 05/31/19   Gildardo Pounds, NP  Continuous Blood Gluc Sensor (FREESTYLE LIBRE 14 DAY SENSOR) MISC 1 Dose by Does not apply route daily. 06/07/19   Gildardo Pounds, NP    Dulaglutide (TRULICITY) 4.78 GN/5.6OZ SOPN INJECT SUBCUTANEOUSLY 0.75  MG ONCE WEEKLY 08/03/19   Gildardo Pounds, NP  glimepiride (AMARYL) 4 MG tablet TAKE 1 TABLET BY MOUTH  DAILY WITH BREAKFAST 03/02/19   Charlott Rakes, MD  glucose blood (ONETOUCH VERIO) test strip Use as instructed to check blood sugar daily. 02/05/19   Charlott Rakes, MD  Insulin Pen Needle (B-D UF III MINI PEN NEEDLES) 31G X 5 MM MISC Use as instructed. Inject into the skin once daily. 03/06/19   Gildardo Pounds, NP  lidocaine (LIDODERM) 5 % Place 1 patch onto the skin daily. 08/17/19   Gildardo Pounds, NP  losartan (COZAAR) 50 MG tablet TAKE 1 TABLET BY MOUTH  DAILY 07/29/19   Charlott Rakes, MD  metFORMIN (GLUCOPHAGE) 1000 MG tablet Take 1 tablet (1,000 mg total) by mouth 2 (two) times daily with a meal. 08/03/19 01/30/20  Gildardo Pounds, NP  OneTouch Delica Lancets 30Q MISC Use as instructed to check blood sugar once daily. 02/05/19   Charlott Rakes, MD  tiZANidine (ZANAFLEX) 4 MG tablet Take 1 tablet (4 mg total) by mouth every 6 (six) hours as needed for muscle spasms. Patient not taking: Reported on 09/14/2019 08/17/19   Gildardo Pounds, NP    Allergies    Patient has no known allergies.  Review of Systems   Review of Systems  All other systems reviewed and are negative.   Physical Exam Updated Vital Signs BP (!) 142/81 (BP Location: Left Arm)   Pulse (!) 103   Temp 97.7 F (36.5 C) (Oral)   Resp 15   SpO2 97%   Physical Exam Vitals and nursing note reviewed.  Constitutional:      General: He is not in acute distress.    Appearance: He is well-developed.  HENT:     Head: Normocephalic and atraumatic.  Eyes:     Extraocular Movements: Extraocular movements intact.     Conjunctiva/sclera: Conjunctivae normal.     Pupils: Pupils are equal, round, and reactive to light.  Cardiovascular:     Rate and Rhythm: Normal rate and regular rhythm.  Pulmonary:     Effort: Pulmonary effort is normal.      Breath sounds: Normal breath sounds.  Abdominal:     General: Abdomen is flat.     Palpations: Abdomen is soft.     Tenderness: There is no abdominal tenderness.  Musculoskeletal:     Cervical back: Neck supple.  Skin:    Findings: No rash.  Neurological:     Mental Status: He is alert and oriented to person, place, and time.     Comments: Neurologic exam:  Speech clear, pupils equal round reactive to light, extraocular movements intact  Normal peripheral visual  fields Cranial nerves III through XII normal including no facial droop Follows commands, moves all extremities x4, normal strength to bilateral upper and lower extremities at all major muscle groups including grip Sensation normal to light touch Coordination intact, no limb ataxia, finger-nose-finger normal Rapid alternating movements normal No pronator drift Gait normal   Psychiatric:        Mood and Affect: Mood normal.     ED Results / Procedures / Treatments   Labs (all labs ordered are listed, but only abnormal results are displayed) Labs Reviewed  CBC - Abnormal; Notable for the following components:      Result Value   Hemoglobin 12.7 (*)    MCH 24.0 (*)    MCHC 29.7 (*)    RDW 16.7 (*)    All other components within normal limits  COMPREHENSIVE METABOLIC PANEL - Abnormal; Notable for the following components:   CO2 21 (*)    Glucose, Bld 231 (*)    All other components within normal limits  I-STAT CHEM 8, ED - Abnormal; Notable for the following components:   Glucose, Bld 222 (*)    All other components within normal limits  CBG MONITORING, ED - Abnormal; Notable for the following components:   Glucose-Capillary 103 (*)    All other components within normal limits  RESPIRATORY PANEL BY RT PCR (FLU A&B, COVID)  PROTIME-INR  APTT  DIFFERENTIAL    EKG EKG Interpretation  Date/Time:  Thursday Sep 17 2019 12:11:32 EDT Ventricular Rate:  109 PR Interval:  212 QRS Duration: 86 QT  Interval:  328 QTC Calculation: 441 R Axis:   -6 Text Interpretation: Sinus tachycardia with 1st degree A-V block Septal infarct , age undetermined Abnormal ECG No significant change since 08/05/2017 Confirmed by Veryl Speak [phone]) on 09/17/2019 6:00:25 PM     Radiology CT HEAD WO CONTRAST  Result Date: 09/17/2019 CLINICAL DATA:  Left side headache EXAM: CT HEAD WITHOUT CONTRAST TECHNIQUE: Contiguous axial images were obtained from the base of the skull through the vertex without intravenous contrast. COMPARISON:  2018 FINDINGS: Brain: There is no acute intracranial hemorrhage, mass effect, or edema. Preserved gray-white differentiation. There are chronic small vessel infarcts of the left thalamus, lentiform nucleus, and adjacent white matter. Age-indeterminate small vessel infarct of the left caudate. Additional patchy hypoattenuation in the supratentorial white matter is nonspecific but probably reflects mild to moderate chronic microvascular ischemic changes. There is no extra-axial fluid collection. Ventricles and sulci are within normal limits in size and configuration. Vascular: No hyperdense vessel or unexpected calcification. Skull: Calvarium is unremarkable. Sinuses/Orbits: Polypoid mucosal thickening primarily involving the ethmoid and left maxillary sinuses. Unremarkable orbits. Other: Mastoid air cells are clear. IMPRESSION: No acute intracranial hemorrhage or mass effect. Age-indeterminate small vessel infarct of the left caudate. Chronic small vessel infarcts of the left thalamus, lentiform nucleus, and adjacent white matter. Mild to moderate chronic microvascular ischemic changes. Fall Electronically Signed   By: Macy Mis M.D.   On: 09/17/2019 14:34   MR BRAIN WO CONTRAST  Result Date: 09/17/2019 CLINICAL DATA:  Sensory loss right hand, left headache, right facial tightness EXAM: MRI HEAD WITHOUT CONTRAST TECHNIQUE: Multiplanar, multiecho pulse sequences of the brain and surrounding  structures were obtained without intravenous contrast. COMPARISON:  None. FINDINGS: Motion artifact is present. Brain: Subcentimeter focus of reduced diffusion involving the left thalamus. Patchy and confluent areas of T2 hyperintensity in the supratentorial white matter nonspecific but probably reflect moderate chronic microvascular ischemic changes. There are chronic  small vessel infarcts of the thalamus and basal ganglia on the left as well as bilateral cerebellar hemispheres. No evidence of intracranial hemorrhage. There is no intracranial mass, mass effect, or edema. There is no hydrocephalus or extra-axial fluid collection. Vascular: Major vessel flow voids at the skull base are preserved. Skull and upper cervical spine: Normal marrow signal is preserved. Sinuses/Orbits: Moderate paranasal sinus mucosal thickening. Orbits are unremarkable. Other: Sella is unremarkable.  Mastoid air cells are clear. IMPRESSION: Small acute infarct of the left thalamus. Moderate chronic microvascular ischemic changes. Chronic small vessel infarcts as detailed above. Electronically Signed   By: Macy Mis M.D.   On: 09/17/2019 17:37    Procedures Procedures (including critical care time)  Medications Ordered in ED Medications  sodium chloride flush (NS) 0.9 % injection 3 mL (0 mLs Intravenous Hold 09/17/19 1708)    ED Course  I have reviewed the triage vital signs and the nursing notes.  Pertinent labs & imaging results that were available during my care of the patient were reviewed by me and considered in my medical decision making (see chart for details).    MDM Rules/Calculators/A&P                      BP 139/79   Pulse 84   Temp 97.7 F (36.5 C) (Oral)   Resp 13   SpO2 97%   Final Clinical Impression(s) / ED Diagnoses Final diagnoses:  Arterial ischemic stroke, vertebrobasilar, thalamic, acute, left ([place])    Rx / DC Orders ED Discharge Orders    None     4:27 PM This is a 64 year old  male with prior history of stroke currently on Plavix here with strokelike symptoms last known normal was 5 days ago.  Complaint include headache, right-sided facial tingling sensation, 10 sensation to the tip of his fingers, as well as vertigo.  He does not have any focal neuro deficit on my initial exam and is mentating appropriately.  However, will consider brain MRI to rule out stroke given his risk factors.  6:01 PM Brain MRI demonstrates small acute infarct in left thalamus.  Moderate chronic microvascular ischemic changes.  Chronic small vessel infarct.  This finding is consistent with patient's presentation.  Will consult neurology for recommendation.  6:38 PM Appreciate consultation from oncall neurologist Dr. Cheral Marker who have reviewed MRI and agrees pt has acute stroke.  He will see the pt and request medicine for admission. Screening covid-19 test ordered.  Pt is aware of plan.    7:40 PM Appreciate consultation from [facility] Dr. Hal Hope who agrees to see and admit pt for further management of his acute stroke.   Quentavious Rittenhouse was evaluated in Emergency Department on 09/17/2019 for the symptoms described in the history of present illness. He was evaluated in the context of the global COVID-19 pandemic, which necessitated consideration that the patient might be at risk for infection with the SARS-CoV-2 virus that causes COVID-19. Institutional protocols and algorithms that pertain to the evaluation of patients at risk for COVID-19 are in a state of rapid change based on information released by regulatory bodies including the CDC and federal and state organizations. These policies and algorithms were followed during the patient's care in the ED.    Domenic Moras, PA-C 09/17/19 1941    Veryl Speak, MD 09/17/19 2237

## 2019-09-17 NOTE — ED Notes (Signed)
To MRI

## 2019-09-17 NOTE — ED Triage Notes (Signed)
Pt states L headache with R facial tightness, sensory loss in R hand and difficulty walking. States he saw his doctor on Monday who did not think he was having a stroke. Speech mildly slurred. Pt a/ox4, resp e/u, nad.

## 2019-09-18 ENCOUNTER — Other Ambulatory Visit: Payer: Self-pay

## 2019-09-18 ENCOUNTER — Observation Stay (HOSPITAL_BASED_OUTPATIENT_CLINIC_OR_DEPARTMENT_OTHER): Payer: Medicare Other

## 2019-09-18 DIAGNOSIS — I639 Cerebral infarction, unspecified: Secondary | ICD-10-CM | POA: Diagnosis not present

## 2019-09-18 DIAGNOSIS — I63212 Cerebral infarction due to unspecified occlusion or stenosis of left vertebral arteries: Secondary | ICD-10-CM | POA: Diagnosis not present

## 2019-09-18 DIAGNOSIS — E1159 Type 2 diabetes mellitus with other circulatory complications: Secondary | ICD-10-CM

## 2019-09-18 DIAGNOSIS — I6389 Other cerebral infarction: Secondary | ICD-10-CM

## 2019-09-18 DIAGNOSIS — I6322 Cerebral infarction due to unspecified occlusion or stenosis of basilar arteries: Secondary | ICD-10-CM | POA: Diagnosis not present

## 2019-09-18 DIAGNOSIS — Z8673 Personal history of transient ischemic attack (TIA), and cerebral infarction without residual deficits: Secondary | ICD-10-CM

## 2019-09-18 LAB — GLUCOSE, CAPILLARY
Glucose-Capillary: 62 mg/dL — ABNORMAL LOW (ref 70–99)
Glucose-Capillary: 89 mg/dL (ref 70–99)
Glucose-Capillary: 163 mg/dL — ABNORMAL HIGH (ref 70–99)
Glucose-Capillary: 205 mg/dL — ABNORMAL HIGH (ref 70–99)

## 2019-09-18 LAB — HEMOGLOBIN A1C
Hgb A1c MFr Bld: 7.8 % — ABNORMAL HIGH (ref 4.8–5.6)
Mean Plasma Glucose: 177.16 mg/dL

## 2019-09-18 LAB — ECHOCARDIOGRAM COMPLETE
Height: 71 in
Weight: 3185.21 oz

## 2019-09-18 LAB — RAPID URINE DRUG SCREEN, HOSP PERFORMED
Amphetamines: NOT DETECTED
Barbiturates: NOT DETECTED
Benzodiazepines: NOT DETECTED
Cocaine: NOT DETECTED
Opiates: NOT DETECTED
Tetrahydrocannabinol: NOT DETECTED

## 2019-09-18 LAB — HIV ANTIBODY (ROUTINE TESTING W REFLEX): HIV Screen 4th Generation wRfx: NONREACTIVE

## 2019-09-18 MED ORDER — ASPIRIN EC 81 MG PO TBEC
81.0000 mg | DELAYED_RELEASE_TABLET | Freq: Every day | ORAL | Status: DC
  Administered 2019-09-18: 10:00:00 81 mg via ORAL
  Filled 2019-09-18: qty 1

## 2019-09-18 MED ORDER — ASPIRIN 81 MG PO TBEC: 81.0000 mg | DELAYED_RELEASE_TABLET | Freq: Every day | ORAL | 0 refills | Status: AC

## 2019-09-18 MED ORDER — ATORVASTATIN CALCIUM 80 MG PO TABS: 80.0000 mg | ORAL_TABLET | Freq: Every day | ORAL | 0 refills | Status: DC

## 2019-09-18 MED ORDER — ATORVASTATIN CALCIUM 80 MG PO TABS
80.0000 mg | ORAL_TABLET | Freq: Every day | ORAL | Status: DC
  Administered 2019-09-18: 10:00:00 80 mg via ORAL
  Filled 2019-09-18: qty 1

## 2019-09-18 MED FILL — ATORVASTATIN CALCIUM 80 MG: 80 | 30 days supply | Qty: 30 | Fill #0

## 2019-09-18 MED FILL — ASPIRIN LOW DOSE 81 MG TBEC: 81 | 30 days supply | Qty: 30 | Fill #0

## 2019-09-18 NOTE — Social Work (Signed)
CSW met with pt at bedside. CSW introduced self and explained her role. CSW completed sbirt with pt.  Pt scored a 0 on the sbirt scale. Pt denied alcohol use. Pt denied substance use. Pt did not need resources at this time.  Emeterio Reeve, Latanya Presser, [place] Social Worker [phone]

## 2019-09-18 NOTE — Progress Notes (Signed)
  Echocardiogram 2D Echocardiogram has been performed.  Jon [name] 09/18/2019, 8:58 AM

## 2019-09-18 NOTE — Evaluation (Signed)
Speech Language Pathology Evaluation Patient Details Name: Jon [name] MRN: [id number] DOB: [birth year] Today's Date: 09/18/2019 Time: [id number] SLP Time Calculation (min) (ACUTE ONLY): 11 min  Problem List:  Patient Active Problem List   Diagnosis Date Noted  . Acute CVA (cerebrovascular accident) ([place]) 09/17/2019  . Type 2 diabetes mellitus with vascular disease ([place]) 09/17/2019  . CVA (cerebral vascular accident) ([place]) 04/26/2017  . Routine general medical examination at a health care facility 06/04/2016  . Trigger finger, acquired 02/24/2016  . Erectile dysfunction 03/17/2015  . Dyslipidemia 03/12/2015  . HTN (hypertension) 03/12/2015   Past Medical History:  Past Medical History:  Diagnosis Date  . Diabetes mellitus without complication ([place])    type II  . History of kidney stones   . Hyperlipidemia   . Hypertension   . Kidney stones   . Stomach ulcer   . Stroke [facility])    Dec. 14 2018   Past Surgical History:  Past Surgical History:  Procedure Laterality Date  . CYSTOSCOPY Left 12/10/2016   Procedure: cystoscopy with left ureteral stone extraction ;  Surgeon: Kathie Rhodes, MD;  Location: [facility];  Service: Urology;  Laterality: Left;  . HERNIA REPAIR    . Stomach ulcer     HPI:  64 year old man with past medical history of diabetes, hypertension, hyperlipidemia and a prior stroke in 2018 with no residual deficits presents for evaluation of 3 to 4 days worth of right-sided numbness and weakness. MRI brain with the left thalamic lacunar infarct.   Assessment / Plan / Recommendation Clinical Impression  Pt presents with clear, fluent speech.  Expressive and receptive language are WNL. Cognition is Jon [name] with regard to memory, attention, and orientation.  No communication needs identified, no SLP f/u.     SLP Assessment  SLP Recommendation/Assessment: Patient does not need any further Speech Lanaguage Pathology Services SLP Visit Diagnosis: Cognitive communication deficit  (R41.841)    Follow Up Recommendations  None    Frequency and Duration           SLP Evaluation Cognition  Overall Cognitive Status: Within Functional Limits for tasks assessed Arousal/Alertness: Awake/alert Orientation Level: Oriented X4 Attention: Selective Selective Attention: Appears intact Memory: Appears intact       Comprehension  Auditory Comprehension Overall Auditory Comprehension: Appears within functional limits for tasks assessed Reading Comprehension Reading Status: Within funtional limits    Expression Expression Primary Mode of Expression: Verbal Verbal Expression Overall Verbal Expression: Appears within functional limits for tasks assessed Written Expression Dominant Hand: Right   Oral / Motor  Oral Motor/Sensory Function Overall Oral Motor/Sensory Function: Within functional limits Motor Speech Overall Motor Speech: Appears within functional limits for tasks assessed   GO                    Jon [name] 09/18/2019, 1:27 PM  Jon [name] L. Tivis Ringer, [place] Office number [phone] Pager [phone]

## 2019-09-18 NOTE — Progress Notes (Signed)
Pt discharged home.  AVS discharge information provided all questions addressed. Peripheral IN removed, telemetry discontinued. Pt transported by w/c to [facility], by [place], [place].

## 2019-09-18 NOTE — Discharge Summary (Signed)
Physician Discharge Summary  Jon [name] LOV:[id number] DOB: 02/08/1956 DOA: 09/17/2019  PCP: Gildardo Pounds, NP  Admit date: 09/17/2019 Discharge date: 09/18/2019  Admitted From: Home Disposition:  home  Recommendations for Outpatient Follow-up:  1. Follow up with PCP in 1-2 weeks 2. Follow up with Neurology as scheduled 3. Please follow up with patient closely and gradually normalize BP over a period of 5-7 days post-discharge. Blood pressure meds on hold at time of d/c to allow permissive HTN  Discharge Condition:Stable CODE STATUS:Full Diet recommendation: Diabetic   Brief/Interim Summary: 63 y.o. male with history of previous stroke hypertension diabetes mellitus hyperlipidemia presents to the ER because of persistent right upper extremity numbness weakness since May 3 4 days ago.  Patient woke up on May 3 with right upper extremity weakness and numbness and some slurred speech.  Had gone to his PCP and was given education about carpal tunnel syndrome.  Since symptoms persisted patient comes to the ER.  Patient states he also has been having difficulty with ambulation.  Did not lose consciousness or fall.  Did not have any difficulty with swallowing or any visual symptoms.  Denies any weakness on the left side.  Patient has had a previous stroke in 2018 which had left patient with no visual symptoms.  In the ER patient on exam has mild right upper extremity weakness with slurred speech passed swallow.  MRI brain shows left thalamic infarct.   Discharge Diagnoses:  Principal Problem:   Acute CVA (cerebrovascular accident) ([place]) Active Problems:   HTN (hypertension)   Type 2 diabetes mellitus with vascular disease ([place])  1. Acute L thalamic CVA 1. Confirmed on MRI 2. Neurology consulted 3. Pt now s/p CT angiogram of the head and neck 2D echo 4. Hgb 7.1 with LDL of 99 5. Statin increased to '80mg'$  6. Discussed with Neurology, recommendation for DAPT with ASA and plavix x 3 weeks,  then continue on plavix alone afterwards 7. Pt to follow up with Neurology as outpatient 2. Hypertension 1. Cardiology recommended permissive HTN for now with plan for gradual normalization in BP over the next 5-7 days 2. Remained stable while in hospital 3. Diabetes mellitus type 2 1. A1c noted to be 7.1 2. Pt was continued on sliding scale while in hospital 4. Normocytic normochromic anemia 1. Remained hemodynamically stable 5. Hyperlipidemia on statins. 1. LDL of 99, statin dose increased to '80mg'$   Discharge Instructions  Discharge Instructions    Ambulatory referral to Neurology   Complete by: As directed    Follow up in stroke clinic at [facility] with Frann Rider, NP in about 4 weeks. If not available, consider Dr. Antony Contras, Dr. Bess Harvest, or Dr. Sarina Ill.     Allergies as of 09/18/2019   No Known Allergies     Medication List    STOP taking these medications   amLODipine 10 MG tablet Commonly known as: NORVASC   ibuprofen 200 MG tablet Commonly known as: ADVIL   losartan 50 MG tablet Commonly known as: COZAAR   tiZANidine 4 MG tablet Commonly known as: Zanaflex     TAKE these medications   acetaminophen-codeine 300-30 MG tablet Commonly known as: TYLENOL #3 Take 1-2 tablets by mouth every 8 (eight) hours as needed for moderate pain or severe pain.   aspirin 81 MG EC tablet Take 1 tablet (81 mg total) by mouth daily. Start taking on: Sep 19, 2019   atorvastatin 80 MG tablet Commonly known as: LIPITOR Take  1 tablet (80 mg total) by mouth daily. Start taking on: Sep 19, 2019 What changed:   medication strength  how much to take   B-D UF III MINI PEN NEEDLES 31G X 5 MM Misc Generic drug: Insulin Pen Needle Use as instructed. Inject into the skin once daily.   clopidogrel 75 MG tablet Commonly known as: PLAVIX TAKE 1 TABLET BY MOUTH  DAILY   FreeStyle Libre 2 Reader Systm Devi 1 Device by Does not apply route 3  (three) times daily. E11.65   FreeStyle Libre 14 Day Reader Kerrin Mo 1 Device by Does not apply route daily.   FreeStyle Libre 14 Day Sensor Misc 1 Dose by Does not apply route daily.   glimepiride 4 MG tablet Commonly known as: AMARYL TAKE 1 TABLET BY MOUTH  DAILY WITH BREAKFAST   lidocaine 5 % Commonly known as: Lidoderm Place 1 patch onto the skin daily. What changed:   when to take this  reasons to take this   metFORMIN 1000 MG tablet Commonly known as: GLUCOPHAGE Take 1 tablet (1,000 mg total) by mouth 2 (two) times daily with a meal.   OneTouch Delica Lancets 16L Misc Use as instructed to check blood sugar once daily.   OneTouch Verio test strip Generic drug: glucose blood Use as instructed to check blood sugar daily.   OneTouch Verio w/Device Kit Use to check blood sugar daily.   Trulicity 8.45 XM/4.6OE Sopn Generic drug: Dulaglutide INJECT SUBCUTANEOUSLY 0.75  MG ONCE WEEKLY What changed:   how much to take  how to take this  when to take this  additional instructions      Follow-up Information    [facility] Follow up in 4 week(s).   Specialty: Neurology Why: stroke clinic. office will call with appt date and time.  Contact information: [address] [place] [phone]       Gildardo Pounds, NP. Schedule an appointment as soon as possible for a visit in 1 week(s).   Specialty: Nurse Practitioner Contact information: [address] [place] [address] [phone]          No Known Allergies  Consultations:  Neurology  Procedures/Studies: CT ANGIO HEAD W OR WO CONTRAST  Result Date: 09/17/2019 CLINICAL DATA:  Left thalamic infarct. EXAM: CT ANGIOGRAPHY HEAD AND NECK TECHNIQUE: Multidetector CT imaging of the head and neck was performed using the standard protocol during bolus administration of intravenous contrast. Multiplanar CT image reconstructions and MIPs were  obtained to evaluate the vascular anatomy. Carotid stenosis measurements (when applicable) are obtained utilizing NASCET criteria, using the distal internal carotid diameter as the denominator. CONTRAST:  75m OMNIPAQUE IOHEXOL 350 MG/ML SOLN COMPARISON:  None. FINDINGS: CTA NECK Aortic arch: Great vessel origins are patent. Right carotid system: Patent. There is mild calcified plaque at the proximal ICA causing minimal stenosis. Left carotid system: Patent. There is calcified and noncalcified plaque along the proximal ICA causing mild less than 50% stenosis. Stable small focal outpouching near the ICA origin, which could reflect an area of ulcerated plaque. Vertebral arteries: Patent and codominant. Stable irregularity of the proximal left vertebral artery with moderate stenosis. Skeleton: Degenerative changes of the cervical spine Other neck: New 1 cm posterior right thyroid lobe nodule. No further follow-up is required by current guidelines. No mass or adenopathy. Upper chest: Unremarkable. Review of the MIP images confirms the above findings CTA HEAD Anterior circulation: Intracranial internal carotid arteries are patent. Anterior and middle  cerebral arteries are patent. Posterior circulation: Intracranial vertebral arteries, basilar artery, and posterior cerebral arteries are patent. A posterior communicating artery is identified on the right. Venous sinuses: Patent as allowed by contrast bolus timing. Review of the MIP images confirms the above findings IMPRESSION: No large vessel occlusion. No hemodynamically significant stenosis in the neck. No significant intracranial stenosis. Electronically Signed   By: Macy Mis M.D.   On: 09/17/2019 20:32   CT HEAD WO CONTRAST  Result Date: 09/17/2019 CLINICAL DATA:  Left side headache EXAM: CT HEAD WITHOUT CONTRAST TECHNIQUE: Contiguous axial images were obtained from the base of the skull through the vertex without intravenous contrast. COMPARISON:  2018  FINDINGS: Brain: There is no acute intracranial hemorrhage, mass effect, or edema. Preserved gray-white differentiation. There are chronic small vessel infarcts of the left thalamus, lentiform nucleus, and adjacent white matter. Age-indeterminate small vessel infarct of the left caudate. Additional patchy hypoattenuation in the supratentorial white matter is nonspecific but probably reflects mild to moderate chronic microvascular ischemic changes. There is no extra-axial fluid collection. Ventricles and sulci are within normal limits in size and configuration. Vascular: No hyperdense vessel or unexpected calcification. Skull: Calvarium is unremarkable. Sinuses/Orbits: Polypoid mucosal thickening primarily involving the ethmoid and left maxillary sinuses. Unremarkable orbits. Other: Mastoid air cells are clear. IMPRESSION: No acute intracranial hemorrhage or mass effect. Age-indeterminate small vessel infarct of the left caudate. Chronic small vessel infarcts of the left thalamus, lentiform nucleus, and adjacent white matter. Mild to moderate chronic microvascular ischemic changes. Fall Electronically Signed   By: Macy Mis M.D.   On: 09/17/2019 14:34   CT ANGIO NECK W OR WO CONTRAST  Result Date: 09/17/2019 CLINICAL DATA:  Left thalamic infarct. EXAM: CT ANGIOGRAPHY HEAD AND NECK TECHNIQUE: Multidetector CT imaging of the head and neck was performed using the standard protocol during bolus administration of intravenous contrast. Multiplanar CT image reconstructions and MIPs were obtained to evaluate the vascular anatomy. Carotid stenosis measurements (when applicable) are obtained utilizing NASCET criteria, using the distal internal carotid diameter as the denominator. CONTRAST:  57m OMNIPAQUE IOHEXOL 350 MG/ML SOLN COMPARISON:  None. FINDINGS: CTA NECK Aortic arch: Great vessel origins are patent. Right carotid system: Patent. There is mild calcified plaque at the proximal ICA causing minimal stenosis.  Left carotid system: Patent. There is calcified and noncalcified plaque along the proximal ICA causing mild less than 50% stenosis. Stable small focal outpouching near the ICA origin, which could reflect an area of ulcerated plaque. Vertebral arteries: Patent and codominant. Stable irregularity of the proximal left vertebral artery with moderate stenosis. Skeleton: Degenerative changes of the cervical spine Other neck: New 1 cm posterior right thyroid lobe nodule. No further follow-up is required by current guidelines. No mass or adenopathy. Upper chest: Unremarkable. Review of the MIP images confirms the above findings CTA HEAD Anterior circulation: Intracranial internal carotid arteries are patent. Anterior and middle cerebral arteries are patent. Posterior circulation: Intracranial vertebral arteries, basilar artery, and posterior cerebral arteries are patent. A posterior communicating artery is identified on the right. Venous sinuses: Patent as allowed by contrast bolus timing. Review of the MIP images confirms the above findings IMPRESSION: No large vessel occlusion. No hemodynamically significant stenosis in the neck. No significant intracranial stenosis. Electronically Signed   By: PMacy MisM.D.   On: 09/17/2019 20:32   MR BRAIN WO CONTRAST  Result Date: 09/17/2019 CLINICAL DATA:  Sensory loss right hand, left headache, right facial tightness EXAM: MRI HEAD WITHOUT CONTRAST TECHNIQUE:  Multiplanar, multiecho pulse sequences of the brain and surrounding structures were obtained without intravenous contrast. COMPARISON:  None. FINDINGS: Motion artifact is present. Brain: Subcentimeter focus of reduced diffusion involving the left thalamus. Patchy and confluent areas of T2 hyperintensity in the supratentorial white matter nonspecific but probably reflect moderate chronic microvascular ischemic changes. There are chronic small vessel infarcts of the thalamus and basal ganglia on the left as well as  bilateral cerebellar hemispheres. No evidence of intracranial hemorrhage. There is no intracranial mass, mass effect, or edema. There is no hydrocephalus or extra-axial fluid collection. Vascular: Major vessel flow voids at the skull base are preserved. Skull and upper cervical spine: Normal marrow signal is preserved. Sinuses/Orbits: Moderate paranasal sinus mucosal thickening. Orbits are unremarkable. Other: Sella is unremarkable.  Mastoid air cells are clear. IMPRESSION: Small acute infarct of the left thalamus. Moderate chronic microvascular ischemic changes. Chronic small vessel infarcts as detailed above. Electronically Signed   By: Macy Mis M.D.   On: 09/17/2019 17:37   ECHOCARDIOGRAM COMPLETE  Result Date: 09/18/2019    ECHOCARDIOGRAM REPORT   Patient Name:   Jon [name] Date of Exam: 09/18/2019 Medical Rec #:  [id number]      Height:       71.0 in Accession #:    [id number]     Weight:       199.1 lb Date of Birth:  02/17/1956       BSA:          2.104 m Patient Age:    33 years       BP:           116/69 mmHg Patient Gender: M              HR:           68 bpm. Exam Location:  Inpatient Procedure: 2D Echo Indications:    stroke 434.91  History:        Patient has prior history of Echocardiogram examinations, most                 recent 04/26/2017. Risk Factors:Hypertension and Diabetes.  Sonographer:    Johny Chess Referring Phys: [place]  1. Left ventricular ejection fraction, by estimation, is 60 to 65%. The left ventricle has normal function. The left ventricle has no regional wall motion abnormalities. There is moderate concentric left ventricular hypertrophy. Left ventricular diastolic parameters were normal.  2. Right ventricular systolic function is normal. The right ventricular size is normal. There is normal pulmonary artery systolic pressure.  3. The mitral valve is normal in structure. Trivial mitral valve regurgitation. No evidence of mitral stenosis.   4. The aortic valve is tricuspid. Aortic valve regurgitation is not visualized. No aortic stenosis is present.  5. The inferior vena cava is normal in size with greater than 50% respiratory variability, suggesting right atrial pressure of 3 mmHg. Conclusion(s)/Recommendation(s): No intracardiac source of embolism detected on this transthoracic study. A transesophageal echocardiogram is recommended to exclude cardiac source of embolism if clinically indicated. FINDINGS  Left Ventricle: Left ventricular ejection fraction, by estimation, is 60 to 65%. The left ventricle has normal function. The left ventricle has no regional wall motion abnormalities. The left ventricular internal cavity size was normal in size. There is  moderate concentric left ventricular hypertrophy. Left ventricular diastolic parameters were normal. Right Ventricle: The right ventricular size is normal. No increase in right ventricular wall thickness. Right ventricular systolic function is normal. There  is normal pulmonary artery systolic pressure. The tricuspid regurgitant velocity is 2.33 m/s, and  with an assumed right atrial pressure of 3 mmHg, the estimated right ventricular systolic pressure is 50.2 mmHg. Left Atrium: Left atrial size was normal in size. Right Atrium: Right atrial size was normal in size. Pericardium: There is no evidence of pericardial effusion. Mitral Valve: The mitral valve is normal in structure. Trivial mitral valve regurgitation. No evidence of mitral valve stenosis. Tricuspid Valve: The tricuspid valve is normal in structure. Tricuspid valve regurgitation is trivial. No evidence of tricuspid stenosis. Aortic Valve: The aortic valve is tricuspid. . There is mild thickening and mild calcification of the aortic valve. Aortic valve regurgitation is not visualized. No aortic stenosis is present. There is mild thickening of the aortic valve. There is mild calcification of the aortic valve. Pulmonic Valve: The pulmonic valve  was not well visualized. Pulmonic valve regurgitation is trivial. No evidence of pulmonic stenosis. Aorta: The aortic root, ascending aorta and aortic arch are all structurally normal, with no evidence of dilitation or obstruction. Venous: The inferior vena cava is normal in size with greater than 50% respiratory variability, suggesting right atrial pressure of 3 mmHg. IAS/Shunts: No atrial level shunt detected by color flow Doppler.  LEFT VENTRICLE PLAX 2D LVIDd:         4.70 cm  Diastology LVIDs:         3.00 cm  LV e' lateral:   6.96 cm/s LV PW:         1.90 cm  LV E/e' lateral: 10.8 LV IVS:        1.82 cm  LV e' medial:    6.31 cm/s LVOT diam:     2.40 cm  LV E/e' medial:  11.9 LV SV:         98 LV SV Index:   47 LVOT Area:     4.52 cm  RIGHT VENTRICLE RV S prime:     11.50 cm/s TAPSE (M-mode): 1.7 cm LEFT ATRIUM             Index       RIGHT ATRIUM           Index LA diam:        3.80 cm 1.81 cm/m  RA Area:     12.70 cm LA Vol (A2C):   49.3 ml 23.43 ml/m RA Volume:   24.60 ml  11.69 ml/m LA Vol (A4C):   43.1 ml 20.48 ml/m LA Biplane Vol: 47.5 ml 22.57 ml/m  AORTIC VALVE LVOT Vmax:   82.90 cm/s LVOT Vmean:  56.700 cm/s LVOT VTI:    0.217 m  AORTA Ao Root diam: 3.80 cm Ao Asc diam:  3.50 cm MITRAL VALVE               TRICUSPID VALVE MV Area (PHT): 3.42 cm    TR Peak grad:   21.7 mmHg MV Decel Time: 222 msec    TR Vmax:        233.00 cm/s MV E velocity: 75.00 cm/s MV A velocity: 93.40 cm/s  SHUNTS MV E/A ratio:  0.80        Systemic VTI:  0.22 m                            Systemic Diam: 2.40 cm Buford Dresser MD Electronically signed by Buford Dresser MD Signature Date/Time: 09/18/2019/11:35:30 AM    Final  Subjective: Eager to go home today  Discharge Exam: Vitals:   09/18/19 0815 09/18/19 1249  BP: (!) 151/85 139/78  Pulse: 70 66  Resp: 18 18  Temp: 98 F (36.7 C) 97.8 F (36.6 C)  SpO2: 98% 99%   Vitals:   09/18/19 0009 09/18/19 0410 09/18/19 0815 09/18/19 1249  BP:  130/74 116/69 (!) 151/85 139/78  Pulse: 79 68 70 66  Resp: '20 18 18 18  '$ Temp: 98 F (36.7 C) 97.6 F (36.4 C) 98 F (36.7 C) 97.8 F (36.6 C)  TempSrc: Oral Oral Oral Oral  SpO2: 97% 98% 98% 99%  Weight:      Height:        General: Pt is alert, awake, not in acute distress Cardiovascular: RRR, S1/S2 +, no rubs, no gallops Respiratory: CTA bilaterally, no wheezing, no rhonchi Abdominal: Soft, NT, ND, bowel sounds + Extremities: no edema, no cyanosis   The results of significant diagnostics from this hospitalization (including imaging, microbiology, ancillary and laboratory) are listed below for reference.     Microbiology: Recent Results (from the past 240 hour(s))  Respiratory Panel by RT PCR (Flu A&B, Covid) - Nasopharyngeal Swab     Status: None   Collection Time: 09/17/19  6:30 PM   Specimen: Nasopharyngeal Swab  Result Value Ref Range Status   SARS Coronavirus 2 by RT PCR NEGATIVE NEGATIVE Final    Comment: (NOTE) SARS-CoV-2 target nucleic acids are NOT DETECTED. The SARS-CoV-2 RNA is generally detectable in upper respiratoy specimens during the acute phase of infection. The lowest concentration of SARS-CoV-2 viral copies this assay can detect is 131 copies/mL. A negative result does not preclude SARS-Cov-2 infection and should not be used as the sole basis for treatment or other patient management decisions. A negative result may occur with  improper specimen collection/handling, submission of specimen other than nasopharyngeal swab, presence of viral mutation(s) within the areas targeted by this assay, and inadequate number of viral copies (<131 copies/mL). A negative result must be combined with clinical observations, patient history, and epidemiological information. The expected result is Negative. Fact Sheet for Patients:  PinkCheek.be Fact Sheet for Healthcare Providers:  GravelBags.it This test is  not yet ap proved or cleared by the [place] FDA and  has been authorized for detection and/or diagnosis of SARS-CoV-2 by FDA under an Emergency Use Authorization (EUA). This EUA will remain  in effect (meaning this test can be used) for the duration of the COVID-19 declaration under Section 564(b)(1) of the Act, 21 U.S.C. section 360bbb-3(b)(1), unless the authorization is terminated or revoked sooner.    Influenza A by PCR NEGATIVE NEGATIVE Final   Influenza B by PCR NEGATIVE NEGATIVE Final    Comment: (NOTE) The Xpert Xpress SARS-CoV-2/FLU/RSV assay is intended as an aid in  the diagnosis of influenza from Nasopharyngeal swab specimens and  should not be used as a sole basis for treatment. Nasal washings and  aspirates are unacceptable for Xpert Xpress SARS-CoV-2/FLU/RSV  testing. Fact Sheet for Patients: PinkCheek.be Fact Sheet for Healthcare Providers: GravelBags.it This test is not yet approved or cleared by the [place] FDA and  has been authorized for detection and/or diagnosis of SARS-CoV-2 by  FDA under an Emergency Use Authorization (EUA). This EUA will remain  in effect (meaning this test can be used) for the duration of the  Covid-19 declaration under Section 564(b)(1) of the Act, 21  U.S.C. section 360bbb-3(b)(1), unless the authorization is  terminated or revoked. Performed at [facility]  [facility], [place] [address], [place], [place] [address]      Labs: BNP (last 3 results) No results for input(s): BNP in the last 8760 hours. Basic Metabolic Panel: Recent Labs  Lab 09/17/19 1238 09/17/19 1302 09/17/19 2033  NA 136 140  --   K 4.4 4.5  --   CL 104 104  --   CO2 21*  --   --   GLUCOSE 231* 222*  --   BUN 14 15  --   CREATININE 1.02 0.90 0.84  CALCIUM 9.1  --   --    Liver Function Tests: Recent Labs  Lab 09/17/19 1238  AST 19  ALT 20  ALKPHOS 53  BILITOT 0.7  PROT 8.0  ALBUMIN 4.0    No results for input(s): LIPASE, AMYLASE in the last 168 hours. No results for input(s): AMMONIA in the last 168 hours. CBC: Recent Labs  Lab 09/14/19 1619 09/17/19 1238 09/17/19 1302 09/17/19 2033  WBC 4.9 7.3  --  5.9  NEUTROABS 2.9 5.5  --   --   HGB 12.7* 12.7* 15.0 12.9*  HCT 40.6 42.7 44.0 42.8  MCV 77* 80.7  --  79.4*  PLT 292 286  --  303   Cardiac Enzymes: No results for input(s): CKTOTAL, CKMB, CKMBINDEX, TROPONINI in the last 168 hours. BNP: Invalid input(s): POCBNP CBG: Recent Labs  Lab 09/17/19 1717 09/17/19 2232 09/18/19 0610 09/18/19 0632 09/18/19 1155  GLUCAP 103* 117* 62* 89 163*   D-Dimer No results for input(s): DDIMER in the last 72 hours. Hgb A1c Recent Labs    09/18/19 0452  HGBA1C 7.8*   Lipid Profile No results for input(s): CHOL, HDL, LDLCALC, TRIG, CHOLHDL, LDLDIRECT in the last 72 hours. Thyroid function studies No results for input(s): TSH, T4TOTAL, T3FREE, THYROIDAB in the last 72 hours.  Invalid input(s): FREET3 Anemia work up No results for input(s): VITAMINB12, FOLATE, FERRITIN, TIBC, IRON, RETICCTPCT in the last 72 hours. Urinalysis    Component Value Date/Time   COLORURINE YELLOW 04/26/2017 0913   APPEARANCEUR CLEAR 04/26/2017 0913   LABSPEC 1.016 04/26/2017 0913   PHURINE 6.0 04/26/2017 0913   GLUCOSEU >=500 (A) 04/26/2017 0913   HGBUR NEGATIVE 04/26/2017 0913   BILIRUBINUR NEGATIVE 04/26/2017 0913   KETONESUR NEGATIVE 04/26/2017 0913   PROTEINUR NEGATIVE 04/26/2017 0913   NITRITE NEGATIVE 04/26/2017 0913   LEUKOCYTESUR NEGATIVE 04/26/2017 0913   Sepsis Labs Invalid input(s): PROCALCITONIN,  WBC,  LACTICIDVEN Microbiology Recent Results (from the past 240 hour(s))  Respiratory Panel by RT PCR (Flu A&B, Covid) - Nasopharyngeal Swab     Status: None   Collection Time: 09/17/19  6:30 PM   Specimen: Nasopharyngeal Swab  Result Value Ref Range Status   SARS Coronavirus 2 by RT PCR NEGATIVE NEGATIVE Final     Comment: (NOTE) SARS-CoV-2 target nucleic acids are NOT DETECTED. The SARS-CoV-2 RNA is generally detectable in upper respiratoy specimens during the acute phase of infection. The lowest concentration of SARS-CoV-2 viral copies this assay can detect is 131 copies/mL. A negative result does not preclude SARS-Cov-2 infection and should not be used as the sole basis for treatment or other patient management decisions. A negative result may occur with  improper specimen collection/handling, submission of specimen other than nasopharyngeal swab, presence of viral mutation(s) within the areas targeted by this assay, and inadequate number of viral copies (<131 copies/mL). A negative result must be combined with clinical observations, patient history, and epidemiological information. The expected result is  Negative. Fact Sheet for Patients:  PinkCheek.be Fact Sheet for Healthcare Providers:  GravelBags.it This test is not yet ap proved or cleared by the [place] FDA and  has been authorized for detection and/or diagnosis of SARS-CoV-2 by FDA under an Emergency Use Authorization (EUA). This EUA will remain  in effect (meaning this test can be used) for the duration of the COVID-19 declaration under Section 564(b)(1) of the Act, 21 U.S.C. section 360bbb-3(b)(1), unless the authorization is terminated or revoked sooner.    Influenza A by PCR NEGATIVE NEGATIVE Final   Influenza B by PCR NEGATIVE NEGATIVE Final    Comment: (NOTE) The Xpert Xpress SARS-CoV-2/FLU/RSV assay is intended as an aid in  the diagnosis of influenza from Nasopharyngeal swab specimens and  should not be used as a sole basis for treatment. Nasal washings and  aspirates are unacceptable for Xpert Xpress SARS-CoV-2/FLU/RSV  testing. Fact Sheet for Patients: PinkCheek.be Fact Sheet for Healthcare  Providers: GravelBags.it This test is not yet approved or cleared by the [place] FDA and  has been authorized for detection and/or diagnosis of SARS-CoV-2 by  FDA under an Emergency Use Authorization (EUA). This EUA will remain  in effect (meaning this test can be used) for the duration of the  Covid-19 declaration under Section 564(b)(1) of the Act, 21  U.S.C. section 360bbb-3(b)(1), unless the authorization is  terminated or revoked. Performed at [facility], [place] [address] [place], [place] [address]    Time spent: 30 min  SIGNED:   Marylu Lund, MD  [facility] 09/18/2019, 2:44 PM  If 7PM-7AM, please contact night-coverage

## 2019-09-18 NOTE — Evaluation (Signed)
Physical Therapy Evaluation Patient Details Name: Jon [name] MRN: [id number] DOB: 12-06-1955 Today's Date: 09/18/2019   History of Present Illness  64 y.o. male past medical history of a prior stroke involving the right side with no residual deficits in 2018, diabetes, hypertension, hyperlipidemia, presented to the emergency room for evaluation of persistent right-sided numbness and weakness that started suddenly upon waking up Monday morning 09/14/2019.   Clinical Impression  Pt in bed upon arrival of PT, agreeable to evaluation at this time. Prior to admission the pt was completely independent, working as a Recruitment consultant. Despite initial c/o balance deficits and R-sided numbness and weakness, the pt was able to demo good stability and strength in both right UE and LE today. The pt continues to report minor changes in sensation in R hand and R calf-foot, but reports his ambulation and balance feel back to his baseline. The pt was able to complete multiple transfers independently in the room and complete ambulation in the hallway without evidence of instability. The pt is safe to return home with supervision from his wife for continued mobility and return to his walking regimen. The pt has no acute PT needs at this time as he reports he is at his baseline level of mobility and function, thank you for the consult.       Follow Up Recommendations No PT follow up;Supervision/Assistance - 24 hour    Equipment Recommendations  None recommended by PT(none needed)    Recommendations for Other Services       Precautions / Restrictions Precautions Precautions: None Restrictions Weight Bearing Restrictions: No      Mobility  Bed Mobility Overal bed mobility: Independent                Transfers Overall transfer level: Modified independent Equipment used: None             General transfer comment: Pt able to stand from bed and sit-stand from chair without assist or supervision for  safety  Ambulation/Gait Ambulation/Gait assistance: Supervision Gait Distance (Feet): 150 Feet Assistive device: None Gait Pattern/deviations: WFL(Within Functional Limits) Gait velocity: 0.8 m/s Gait velocity interpretation: 1.31 - 2.62 ft/sec, indicative of limited community ambulator General Gait Details: Pt able to ambulate in hallway without gait deviation, good heel-toe pattern, no assist needed  Stairs            Wheelchair Mobility    Modified Rankin (Stroke Patients Only) Modified Rankin (Stroke Patients Only) Pre-Morbid Rankin Score: No symptoms Modified Rankin: Moderate disability     Balance Overall balance assessment: Modified Independent                               Standardized Balance Assessment Standardized Balance Assessment : Dynamic Gait Index   Dynamic Gait Index Level Surface: Normal Change in Gait Speed: Normal Gait with Horizontal Head Turns: Normal Gait with Vertical Head Turns: Normal Gait and Pivot Turn: Moderate Impairment Step Over Obstacle: Mild Impairment Step Around Obstacles: Normal Steps: Normal Total Score: 21       Pertinent Vitals/Pain Pain Assessment: No/denies pain    Home Living Family/patient expects to be discharged to:: Private residence Living Arrangements: Spouse/significant other Available Help at Discharge: Family;Available PRN/intermittently Type of Home: House Home Access: Level entry     Home Layout: Able to live on main level with bedroom/bathroom;Two level Home Equipment: None Additional Comments: pt's closet is upstairs    Prior Function Level of  Independence: Independent         Comments: bus driver, 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     [place]: Right    Extremity/Trunk Assessment   Upper Extremity Assessment Upper Extremity Assessment: Defer to OT evaluation    Lower Extremity Assessment Lower Extremity Assessment: Overall WFL for tasks assessed;RLE  deficits/detail RLE Deficits / Details: pt reports decreased sensation distal to R calf RLE Sensation: decreased light touch RLE Coordination: WNL    Cervical / Trunk Assessment Cervical / Trunk Assessment: Normal  Communication   Communication: No difficulties  Cognition Arousal/Alertness: Awake/alert Behavior During Therapy: WFL for tasks assessed/performed;Flat affect Overall Cognitive Status: Within Functional Limits for tasks assessed                                 General Comments: Pt with flat affect, but agreeable to PT session      General Comments      Exercises     Assessment/Plan    PT Assessment Patent does not need any further PT services  PT Problem List Decreased mobility;Decreased activity tolerance;Decreased balance       PT Treatment Interventions Gait training;Balance training;Stair training;Functional mobility training;Patient/family education    PT Goals (Current goals can be found in the Care Plan section)  Acute Rehab PT Goals Patient Stated Goal: return home PT Goal Formulation: With patient Time For Goal Achievement: 10/02/19 Potential to Achieve Goals: Good    Frequency     Barriers to discharge        Co-evaluation               AM-PAC PT "6 Clicks" Mobility  Outcome Measure Help needed turning from your back to your side while in a flat bed without using bedrails?: None Help needed moving from lying on your back to sitting on the side of a flat bed without using bedrails?: None Help needed moving to and from a bed to a chair (including a wheelchair)?: None Help needed standing up from a chair using your arms (e.g., wheelchair or bedside chair)?: None Help needed to walk in hospital room?: None Help needed climbing 3-5 steps with a railing? : A Little 6 Click Score: 23    End of Session Equipment Utilized During Treatment: Gait belt Activity Tolerance: Patient tolerated treatment well Patient left: in  chair;with call bell/phone within reach Nurse Communication: Mobility status PT Visit Diagnosis: Difficulty in walking, not elsewhere classified (R26.2)    Time: [id number] PT Time Calculation (min) (ACUTE ONLY): 17 min   Charges:   PT Evaluation $PT Eval Moderate Complexity: 1 Mod          Karma Ganja, PT, DPT   Acute Rehabilitation Department Pager #: [phone]  Otho Bellows 09/18/2019, 12:03 PM

## 2019-09-18 NOTE — Progress Notes (Signed)
SLP Cancellation Note  Patient Details Name: Jon [name] MRN: [id number] DOB: July 04, 1955   Cancelled treatment:       Reason Eval/Treat Not Completed: Patient at procedure or test/unavailable. Will continue efforts.  Cathern Tahir L. Tivis Ringer, [place] Office number [phone] Pager [phone]    [name] 09/18/2019, 8:45 AM

## 2019-09-18 NOTE — Discharge Instructions (Signed)
Take both aspirin and plavix together for 3 weeks, then afterwards, continue plavix alone

## 2019-09-18 NOTE — Progress Notes (Signed)
CRITICAL VALUE STICKER  CRITICAL VALUE:Results for Jon [name], Jon [name] (MRN [id number]) as of 09/18/2019 06:15  Ref. Range 09/18/2019 06:10  Glucose-Capillary Latest Ref Range: 70 - 99 mg/dL 62 (L)    RECEIVER (on-site recipient of call): Notified by Jubert, NT  RESPONSE: Treated per protocol and will recheck.

## 2019-09-18 NOTE — Evaluation (Signed)
Occupational Therapy Evaluation Patient Details Name: Jon [name] MRN: [id number] DOB: 1956-02-24 Today's Date: 09/18/2019    History of Present Illness 64 y.o. male past medical history of a prior stroke involving the right side with no residual deficits in 2018, diabetes, hypertension, hyperlipidemia, presented to the emergency room for evaluation of persistent right-sided numbness and weakness that started suddenly upon waking up Monday morning 09/14/2019.    Clinical Impression   Pt admitted with the above diagnoses and presents with below problem list. Pt will benefit from continued acute OT to address the below listed deficits and maximize independence with basic ADLs prior to d/c home. At baseline, pt is independent with ADLs, works as a Recruitment consultant. Pt presents with decreased sensation and [place] in RUE. He subjectively endorses feeling RUE weakness but MMT was 5/5. Pt is currently setup with UB ADLs, supervision to min guard with LB ADLs and mobility.      Follow Up Recommendations  Outpatient OT    Equipment Recommendations  None recommended by OT    Recommendations for Other Services       Precautions / Restrictions Precautions Precautions: None Restrictions Weight Bearing Restrictions: No      Mobility Bed Mobility Overal bed mobility: Independent                Transfers Overall transfer level: Modified independent Equipment used: None             General transfer comment: Pt able to stand from bed and sit-stand from chair without assist or supervision for safety    Balance Overall balance assessment: Modified Independent                               Standardized Balance Assessment Standardized Balance Assessment : Dynamic Gait Index   Dynamic Gait Index Level Surface: Normal Change in Gait Speed: Normal Gait with Horizontal Head Turns: Normal Gait with Vertical Head Turns: Normal Gait and Pivot Turn: Moderate Impairment Step Over  Obstacle: Mild Impairment Step Around Obstacles: Normal Steps: Normal Total Score: 21     ADL either performed or assessed with clinical judgement   ADL Overall ADL's : Needs assistance/impaired Eating/Feeding: Set up;Sitting   Grooming: Set up;Sitting   Upper Body Bathing: Set up;Sitting   Lower Body Bathing: Supervison/ safety;Sit to/from stand   Upper Body Dressing : Set up;Sitting   Lower Body Dressing: Supervision/safety;Sit to/from stand   Toilet Transfer: Supervision/safety;Min guard   Toileting- Water quality scientist and Hygiene: Supervision/safety   Tub/ Shower Transfer: Walk-in shower;Min guard;Ambulation   Functional mobility during ADLs: Supervision/safety;Min guard General ADL Comments: Mild instability on turns during mobiltiy. FMC/decreased sensation impacting function per pt report     Vision Baseline Vision/History: Wears glasses Patient Visual Report: No change from baseline       Perception     Praxis      Pertinent Vitals/Pain Pain Assessment: No/denies pain     Hand Dominance Right   Extremity/Trunk Assessment Upper Extremity Assessment Upper Extremity Assessment: RUE deficits/detail RUE Deficits / Details: pt endorses peripheral numbness on his finger tips. MMT 5/5 strength but pt endorses feeling weaker is weaker.  RUE Sensation: decreased light touch RUE Coordination: decreased fine motor(vs decreased sensation; difficulty mangaing zipper on pants)   Lower Extremity Assessment Lower Extremity Assessment: Defer to PT evaluation RLE Deficits / Details: pt reports decreased sensation distal to R calf RLE Sensation: decreased light touch RLE Coordination: WNL  Cervical / Trunk Assessment Cervical / Trunk Assessment: Normal   Communication Communication Communication: No difficulties   Cognition Arousal/Alertness: Awake/alert Behavior During Therapy: WFL for tasks assessed/performed;Flat affect Overall Cognitive Status: Within  Functional Limits for tasks assessed                                 General Comments: Pt with flat affect, but agreeable to PT session   General Comments       Exercises     Shoulder Instructions      Home Living Family/patient expects to be discharged to:: Private residence Living Arrangements: Spouse/significant other Available Help at Discharge: Family;Available PRN/intermittently Type of Home: House(townhome) Home Access: Level entry     Home Layout: Able to live on main level with bedroom/bathroom;Two level Alternate Level Stairs-Number of Steps: flight Alternate Level Stairs-Rails: Left Bathroom Shower/Tub: Walk-in shower         Home Equipment: None   Additional Comments: pt's closet is upstairs      Prior Functioning/Environment Level of Independence: Independent        Comments: bus driver, 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        OT Problem List: Impaired sensation;Impaired UE functional use;Decreased knowledge of precautions;Decreased coordination;Impaired balance (sitting and/or standing)      OT Treatment/Interventions: Self-care/ADL training;Therapeutic exercise;DME and/or AE instruction;Therapeutic activities;Patient/family education;Balance training    OT Goals(Current goals can be found in the care plan section) Acute Rehab OT Goals Patient Stated Goal: return home OT Goal Formulation: With patient Time For Goal Achievement: 10/02/19 Potential to Achieve Goals: Good ADL Goals Pt Will Perform Grooming: Independently;standing Pt Will Perform Lower Body Dressing: Independently;sit to/from stand Pt/caregiver will Perform Home Exercise Program: Right Upper extremity;Independently;With written HEP provided  OT Frequency: Min 2X/week   Barriers to D/C:            Co-evaluation              AM-PAC OT "6 Clicks" Daily Activity     Outcome Measure Help from another person eating meals?: None Help from another person taking care of  personal grooming?: A Little Help from another person toileting, which includes using toliet, bedpan, or urinal?: None Help from another person bathing (including washing, rinsing, drying)?: None Help from another person to put on and taking off regular upper body clothing?: None Help from another person to put on and taking off regular lower body clothing?: A Little 6 Click Score: 22   End of Session    Activity Tolerance: Patient tolerated treatment well Patient left: in bed;with call bell/phone within reach;with bed alarm set  OT Visit Diagnosis: Unsteadiness on feet (R26.81);Other symptoms and signs involving the nervous system (R29.898)                Time: [id number] OT Time Calculation (min): 19 min Charges:  OT General Charges $OT Visit: 1 Visit OT Evaluation $OT Eval Low Complexity: [place], OT Acute Rehabilitation Services Pager: [phone] Office: [phone]   Hortencia Pilar 09/18/2019, 1:02 PM

## 2019-09-18 NOTE — Progress Notes (Signed)
STROKE TEAM PROGRESS NOTE   INTERVAL HISTORY No family is at bedside. Pt lying in bed, stated that his right hand weakness and numbness has resolved. However, on exam, he still has mild right hand dexterity difficulty.  He has worked with PT/OT and only recommend outpt PT follow up. 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 However, he has a scheduled flight next Wednesday to [place] to see his mom. I told him that guideline recommend no air travel within 2 weeks of stroke, however, his stroke is small and he recovers well so far, I do not feel he has high risk for air travel, but I recommend him if able to postpone he should postpone but otherwise needs extra caution, he is in agreement.   Vitals:   09/17/19 2343 09/18/19 0009 09/18/19 0410 09/18/19 0815  BP: (!) 150/74 130/74 116/69 (!) 151/85  Pulse: 76 79 68 70  Resp: 17 20 18 18   Temp: 97.8 F (36.6 C) 98 F (36.7 C) 97.6 F (36.4 C) 98 F (36.7 C)  TempSrc: Oral Oral Oral Oral  SpO2: 97% 97% 98% 98%  Weight:      Height:        CBC:  Recent Labs  Lab 09/14/19 1619 09/17/19 1238 09/17/19 1238 09/17/19 1302 09/17/19 2033  WBC 4.9 7.3  --   --  5.9  NEUTROABS 2.9 5.5  --   --   --   HGB 12.7* 12.7*   < > 15.0 12.9*  HCT 40.6 42.7   < > 44.0 42.8  MCV 77* 80.7  --   --  79.4*  PLT 292 286  --   --  303   < > = values in this interval not displayed.    Basic Metabolic Panel:  Recent Labs  Lab 09/17/19 1238 09/17/19 1238 09/17/19 1302 09/17/19 2033  NA 136  --  140  --   K 4.4  --  4.5  --   CL 104  --  104  --   CO2 21*  --   --   --   GLUCOSE 231*  --  222*  --   BUN 14  --  15  --   CREATININE 1.02   < > 0.90 0.84  CALCIUM 9.1  --   --   --    < > = values in this interval not displayed.   Lipid Panel:     Component Value Date/Time   CHOL 156 08/24/2019 1128   TRIG 86 08/24/2019 1128   HDL 41 08/24/2019 1128   CHOLHDL 3.8 08/24/2019 1128   CHOLHDL 4.4 04/27/2017 0531   VLDL 22  04/27/2017 0531   LDLCALC 99 08/24/2019 1128   HgbA1c:  Lab Results  Component Value Date   HGBA1C 7.8 (H) 09/18/2019   Urine Drug Screen:     Component Value Date/Time   LABOPIA NONE DETECTED 09/18/2019 0737   COCAINSCRNUR NONE DETECTED 09/18/2019 0737   LABBENZ NONE DETECTED 09/18/2019 0737   AMPHETMU NONE DETECTED 09/18/2019 0737   THCU NONE DETECTED 09/18/2019 0737   LABBARB NONE DETECTED 09/18/2019 0737    Alcohol Level     Component Value Date/Time   ETH <10 04/26/2017 0907    IMAGING past 24 hours CT ANGIO HEAD W OR WO CONTRAST  Result Date: 09/17/2019 CLINICAL DATA:  Left thalamic infarct. EXAM: CT ANGIOGRAPHY HEAD AND NECK TECHNIQUE: Multidetector CT imaging of the head and neck  was performed using the standard protocol during bolus administration of intravenous contrast. Multiplanar CT image reconstructions and MIPs were obtained to evaluate the vascular anatomy. Carotid stenosis measurements (when applicable) are obtained utilizing NASCET criteria, using the distal internal carotid diameter as the denominator. CONTRAST:  73mL OMNIPAQUE IOHEXOL 350 MG/ML SOLN COMPARISON:  None. FINDINGS: CTA NECK Aortic arch: Great vessel origins are patent. Right carotid system: Patent. There is mild calcified plaque at the proximal ICA causing minimal stenosis. Left carotid system: Patent. There is calcified and noncalcified plaque along the proximal ICA causing mild less than 50% stenosis. Stable small focal outpouching near the ICA origin, which could reflect an area of ulcerated plaque. Vertebral arteries: Patent and codominant. Stable irregularity of the proximal left vertebral artery with moderate stenosis. Skeleton: Degenerative changes of the cervical spine Other neck: New 1 cm posterior right thyroid lobe nodule. No further follow-up is required by current guidelines. No mass or adenopathy. Upper chest: Unremarkable. Review of the MIP images confirms the above findings CTA HEAD Anterior  circulation: Intracranial internal carotid arteries are patent. Anterior and middle cerebral arteries are patent. Posterior circulation: Intracranial vertebral arteries, basilar artery, and posterior cerebral arteries are patent. A posterior communicating artery is identified on the right. Venous sinuses: Patent as allowed by contrast bolus timing. Review of the MIP images confirms the above findings IMPRESSION: No large vessel occlusion. No hemodynamically significant stenosis in the neck. No significant intracranial stenosis. Electronically Signed   By: Macy Mis M.D.   On: 09/17/2019 20:32   CT HEAD WO CONTRAST  Result Date: 09/17/2019 CLINICAL DATA:  Left side headache EXAM: CT HEAD WITHOUT CONTRAST TECHNIQUE: Contiguous axial images were obtained from the base of the skull through the vertex without intravenous contrast. COMPARISON:  2018 FINDINGS: Brain: There is no acute intracranial hemorrhage, mass effect, or edema. Preserved gray-white differentiation. There are chronic small vessel infarcts of the left thalamus, lentiform nucleus, and adjacent white matter. Age-indeterminate small vessel infarct of the left caudate. Additional patchy hypoattenuation in the supratentorial white matter is nonspecific but probably reflects mild to moderate chronic microvascular ischemic changes. There is no extra-axial fluid collection. Ventricles and sulci are within normal limits in size and configuration. Vascular: No hyperdense vessel or unexpected calcification. Skull: Calvarium is unremarkable. Sinuses/Orbits: Polypoid mucosal thickening primarily involving the ethmoid and left maxillary sinuses. Unremarkable orbits. Other: Mastoid air cells are clear. IMPRESSION: No acute intracranial hemorrhage or mass effect. Age-indeterminate small vessel infarct of the left caudate. Chronic small vessel infarcts of the left thalamus, lentiform nucleus, and adjacent white matter. Mild to moderate chronic microvascular  ischemic changes. Fall Electronically Signed   By: Macy Mis M.D.   On: 09/17/2019 14:34   CT ANGIO NECK W OR WO CONTRAST  Result Date: 09/17/2019 CLINICAL DATA:  Left thalamic infarct. EXAM: CT ANGIOGRAPHY HEAD AND NECK TECHNIQUE: Multidetector CT imaging of the head and neck was performed using the standard protocol during bolus administration of intravenous contrast. Multiplanar CT image reconstructions and MIPs were obtained to evaluate the vascular anatomy. Carotid stenosis measurements (when applicable) are obtained utilizing NASCET criteria, using the distal internal carotid diameter as the denominator. CONTRAST:  12mL OMNIPAQUE IOHEXOL 350 MG/ML SOLN COMPARISON:  None. FINDINGS: CTA NECK Aortic arch: Great vessel origins are patent. Right carotid system: Patent. There is mild calcified plaque at the proximal ICA causing minimal stenosis. Left carotid system: Patent. There is calcified and noncalcified plaque along the proximal ICA causing mild less than 50% stenosis. Stable  small focal outpouching near the ICA origin, which could reflect an area of ulcerated plaque. Vertebral arteries: Patent and codominant. Stable irregularity of the proximal left vertebral artery with moderate stenosis. Skeleton: Degenerative changes of the cervical spine Other neck: New 1 cm posterior right thyroid lobe nodule. No further follow-up is required by current guidelines. No mass or adenopathy. Upper chest: Unremarkable. Review of the MIP images confirms the above findings CTA HEAD Anterior circulation: Intracranial internal carotid arteries are patent. Anterior and middle cerebral arteries are patent. Posterior circulation: Intracranial vertebral arteries, basilar artery, and posterior cerebral arteries are patent. A posterior communicating artery is identified on the right. Venous sinuses: Patent as allowed by contrast bolus timing. Review of the MIP images confirms the above findings IMPRESSION: No large vessel  occlusion. No hemodynamically significant stenosis in the neck. No significant intracranial stenosis. Electronically Signed   By: Macy Mis M.D.   On: 09/17/2019 20:32   MR BRAIN WO CONTRAST  Result Date: 09/17/2019 CLINICAL DATA:  Sensory loss right hand, left headache, right facial tightness EXAM: MRI HEAD WITHOUT CONTRAST TECHNIQUE: Multiplanar, multiecho pulse sequences of the brain and surrounding structures were obtained without intravenous contrast. COMPARISON:  None. FINDINGS: Motion artifact is present. Brain: Subcentimeter focus of reduced diffusion involving the left thalamus. Patchy and confluent areas of T2 hyperintensity in the supratentorial white matter nonspecific but probably reflect moderate chronic microvascular ischemic changes. There are chronic small vessel infarcts of the thalamus and basal ganglia on the left as well as bilateral cerebellar hemispheres. No evidence of intracranial hemorrhage. There is no intracranial mass, mass effect, or edema. There is no hydrocephalus or extra-axial fluid collection. Vascular: Major vessel flow voids at the skull base are preserved. Skull and upper cervical spine: Normal marrow signal is preserved. Sinuses/Orbits: Moderate paranasal sinus mucosal thickening. Orbits are unremarkable. Other: Sella is unremarkable.  Mastoid air cells are clear. IMPRESSION: Small acute infarct of the left thalamus. Moderate chronic microvascular ischemic changes. Chronic small vessel infarcts as detailed above. Electronically Signed   By: Macy Mis M.D.   On: 09/17/2019 17:37   ECHOCARDIOGRAM COMPLETE  Result Date: 09/18/2019    ECHOCARDIOGRAM REPORT   Patient Name:   SIMCHA HUDELSON Date of Exam: 09/18/2019 Medical Rec #:  [id number]      Height:       71.0 in Accession #:    [id number]     Weight:       199.1 lb Date of Birth:  05/30/1955       BSA:          2.104 m Patient Age:    29 years       BP:           116/69 mmHg Patient Gender: M              HR:            68 bpm. Exam Location:  Inpatient Procedure: 2D Echo Indications:    stroke 434.91  History:        Patient has prior history of Echocardiogram examinations, most                 recent 04/26/2017. Risk Factors:Hypertension and Diabetes.  Sonographer:    Johny Chess Referring Phys: [place]  1. Left ventricular ejection fraction, by estimation, is 60 to 65%. The left ventricle has normal function. The left ventricle has no regional wall motion abnormalities. There is moderate concentric left ventricular  hypertrophy. Left ventricular diastolic parameters were normal.  2. Right ventricular systolic function is normal. The right ventricular size is normal. There is normal pulmonary artery systolic pressure.  3. The mitral valve is normal in structure. Trivial mitral valve regurgitation. No evidence of mitral stenosis.  4. The aortic valve is tricuspid. Aortic valve regurgitation is not visualized. No aortic stenosis is present.  5. The inferior vena cava is normal in size with greater than 50% respiratory variability, suggesting right atrial pressure of 3 mmHg. Conclusion(s)/Recommendation(s): No intracardiac source of embolism detected on this transthoracic study. A transesophageal echocardiogram is recommended to exclude cardiac source of embolism if clinically indicated. FINDINGS  Left Ventricle: Left ventricular ejection fraction, by estimation, is 60 to 65%. The left ventricle has normal function. The left ventricle has no regional wall motion abnormalities. The left ventricular internal cavity size was normal in size. There is  moderate concentric left ventricular hypertrophy. Left ventricular diastolic parameters were normal. Right Ventricle: The right ventricular size is normal. No increase in right ventricular wall thickness. Right ventricular systolic function is normal. There is normal pulmonary artery systolic pressure. The tricuspid regurgitant velocity is 2.33 m/s, and   with an assumed right atrial pressure of 3 mmHg, the estimated right ventricular systolic pressure is [id number] mmHg. Left Atrium: Left atrial size was normal in size. Right Atrium: Right atrial size was normal in size. Pericardium: There is no evidence of pericardial effusion. Mitral Valve: The mitral valve is normal in structure. Trivial mitral valve regurgitation. No evidence of mitral valve stenosis. Tricuspid Valve: The tricuspid valve is normal in structure. Tricuspid valve regurgitation is trivial. No evidence of tricuspid stenosis. Aortic Valve: The aortic valve is tricuspid. . There is mild thickening and mild calcification of the aortic valve. Aortic valve regurgitation is not visualized. No aortic stenosis is present. There is mild thickening of the aortic valve. There is mild calcification of the aortic valve. Pulmonic Valve: The pulmonic valve was not well visualized. Pulmonic valve regurgitation is trivial. No evidence of pulmonic stenosis. Aorta: The aortic root, ascending aorta and aortic arch are all structurally normal, with no evidence of dilitation or obstruction. Venous: The inferior vena cava is normal in size with greater than 50% respiratory variability, suggesting right atrial pressure of 3 mmHg. IAS/Shunts: No atrial level shunt detected by color flow Doppler.  LEFT VENTRICLE PLAX 2D LVIDd:         4.70 cm  Diastology LVIDs:         3.00 cm  LV e' lateral:   6.96 cm/s LV PW:         1.90 cm  LV E/e' lateral: 10.8 LV IVS:        1.82 cm  LV e' medial:    6.31 cm/s LVOT diam:     2.40 cm  LV E/e' medial:  11.9 LV SV:         98 LV SV Index:   47 LVOT Area:     4.52 cm  RIGHT VENTRICLE RV S prime:     11.50 cm/s TAPSE (M-mode): 1.7 cm LEFT ATRIUM             Index       RIGHT ATRIUM           Index LA diam:        3.80 cm 1.81 cm/m  RA Area:     12.70 cm LA Vol (A2C):   49.3 ml 23.43 ml/m RA Volume:  24.60 ml  11.69 ml/m LA Vol (A4C):   43.1 ml 20.48 ml/m LA Biplane Vol: 47.5 ml 22.57 ml/m   AORTIC VALVE LVOT Vmax:   82.90 cm/s LVOT Vmean:  56.700 cm/s LVOT VTI:    0.217 m  AORTA Ao Root diam: 3.80 cm Ao Asc diam:  3.50 cm MITRAL VALVE               TRICUSPID VALVE MV Area (PHT): 3.42 cm    TR Peak grad:   21.7 mmHg MV Decel Time: 222 msec    TR Vmax:        233.00 cm/s MV E velocity: 75.00 cm/s MV A velocity: 93.40 cm/s  SHUNTS MV E/A ratio:  0.80        Systemic VTI:  0.22 m                            Systemic Diam: 2.40 cm Buford Dresser MD Electronically signed by Buford Dresser MD Signature Date/Time: 09/18/2019/11:35:30 AM    Final     PHYSICAL EXAM  Temp:  [97.6 F (36.4 C)-98.5 F (36.9 C)] 98.5 F (36.9 C) (05/07 1624) Pulse Rate:  [66-79] 66 (05/07 1624) Resp:  [17-20] 18 (05/07 1624) BP: (116-151)/(69-85) 128/73 (05/07 1624) SpO2:  [97 %-100 %] 100 % (05/07 1624)  General - Well nourished, well developed, in no apparent distress.  Ophthalmologic - fundi not visualized due to noncooperation.  Cardiovascular - Regular rhythm and rate.  Mental Status -  Level of arousal and orientation to time, place, and person were intact. Language including expression, naming, repetition, comprehension was assessed and found intact. Fund of Knowledge was assessed and was intact.  Cranial Nerves II - XII - II - Visual field intact OU. III, IV, VI - Extraocular movements intact. V - Facial sensation intact bilaterally. VII - Facial movement intact bilaterally. VIII - Hearing & vestibular intact bilaterally. X - Palate elevates symmetrically. XI - Chin turning & shoulder shrug intact bilaterally. XII - Tongue protrusion intact.  Motor Strength - The patient's strength was normal in all extremities except mild right hand dexterity difficulty and pronator drift was absent.  Bulk was normal and fasciculations were absent.   Motor Tone - Muscle tone was assessed at the neck and appendages and was normal.  Reflexes - The patient's reflexes were symmetrical in all  extremities and he had no pathological reflexes.  Sensory - Light touch, temperature/pinprick were assessed and were symmetrical.    Coordination - The patient had normal movements in the hands and feet with no ataxia or dysmetria.  Tremor was absent.  Gait and Station - deferred.   ASSESSMENT/PLAN Mr. Aadhav Monto is a 64 y.o. male with history of stroke involving the right side with no residual deficits in 2018, diabetes, hypertension, hyperlipidemia presenting with Right-sided weakness and numbness, MRI w/ lacunar infarct in the left thalamus.  Stroke:   L thalamic infarct secondary to small vessel disease source  CT head No acute abnormality. Old L caudate, L thalamic, L lentiform nucleus, infarct. Small vessel disease.   MRI  Small L thalamic infarct. Small vessel disease. Atrophy. Old same location infarcts.  CTA head & neck no LVO. No sign stenosis   2D Echo EF 60-65%. No source of embolus   LDL 99  HgbA1c 7.8  Lovenox 40 mg sq daily for VTE prophylaxis  clopidogrel 75 mg daily prior to admission, now on aspirin 81 mg  daily and clopidogrel 75 mg daily. Continue DAPT x 3 weeks then plavix alone.    Therapy recommendations:  outpt OT  Disposition:  Return home Follow-up Stroke Clinic at [facility] in 4 weeks. Office will call with appointment date and time. Order placed.  Hx stroke/TIA   04/2017 - admitted for right-sided weakness and slurred speech.  MRI showed Acute lacunar infarct in the posterior limb left internal capsule/lateral thalamus secondary to small vessel disease.  CTA head and neck left we 170% stenosis in the left V2 mild to moderate stenosis.  EF 60 to 65%.  LDL 83 and A1c 10.1.  Changed asa to plavix, increased statin.   Hypertension  Stable . Permissive hypertension (OK if < 220/120) but gradually normalize in 5-7 days . Long-term BP goal normotensive  Hyperlipidemia  Home meds:  lipitor 40  Now on lipitor 80  LDL 99,  goal < 70  Continue statin at discharge  Diabetes type II Uncontrolled  HgbA1c 7.8, goal < 7.0  CBGs  SSI  Close PCP follow up  Other Stroke Risk Factors  Advanced age  ETOH use, alcohol level <10, advised to drink no more than 2 drink(s) a day  Overweight, Body mass index is 27.77 kg/m., recommend weight loss, diet and exercise as appropriate   Family hx stroke (father)  Other Active Problems  Normocytic normochromic anemia  Hospital day # 0  Neurology will sign off. Please call with questions. Pt will follow up with stroke clinic NP at [facility] in about 4 weeks. Thanks for the consult.  Rosalin Hawking, MD PhD Stroke Neurology 09/18/2019 10:58 PM   To contact Stroke Continuity provider, please refer to [email or web address] After hours, contact General Neurology

## 2019-09-21 ENCOUNTER — Other Ambulatory Visit: Payer: Self-pay | Admitting: Nurse Practitioner

## 2019-09-21 ENCOUNTER — Telehealth: Payer: Self-pay

## 2019-09-21 DIAGNOSIS — I639 Cerebral infarction, unspecified: Secondary | ICD-10-CM

## 2019-09-21 NOTE — Telephone Encounter (Signed)
Transition Care Management Follow-up Telephone Call  Date of discharge and from where: 09/18/2019, [facility]   How have you been since you were released from the hospital? He said he is doing okay, progressing gradually.  Still has some stiffness on the right side of his face but it has improved since he was in the hospital   Any questions or concerns?  none at this time   He said that he plans to return to work next week and he has a return to work letter from the doctor in the hospital. 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   Items Reviewed:  Did the pt receive and understand the discharge instructions provided?  yes  Medications obtained and verified? he said that he has his medications. He verbalized understanding of the order for atorvastatin.  The AVS indicates to stop taking amlodipine, losartan, ibuprofen and tizanidine. He said that he was not aware to stop the BP medications. He will review the medication list and call this CM back with any questions.   Any new allergies since your discharge?  none reported   Do you have support at home?  yes, his fiance  Other (ie: DME, Home Health, etc) no home health or DME ordered.  Does not have [employer] as noted on AVS but has a glucometer.  He said that his blood sugars have been good, the highest about 124.  Functional Questionnaire: (I = Independent and D = Dependent) ADL's: independent   Follow up appointments reviewed:    PCP Hospital f/u appt confirmed? He only wants to see PCP.  First available appt  - 09/28/2019 @1050   Specialist Hospital f/u appt confirmed? GI-10/06/2019, needs neurology appointment   Are transportation arrangements needed? no,   If their condition worsens, is the pt aware to call  their PCP or go to the ED? yes  Was the patient provided with contact information for the PCP's office or ED? he has the clinic phone number  Was the pt encouraged to call back with  questions or concerns?  yes

## 2019-09-28 ENCOUNTER — Ambulatory Visit: Payer: Medicare Other | Admitting: Nurse Practitioner

## 2019-10-06 ENCOUNTER — Encounter: Payer: Self-pay | Admitting: Gastroenterology

## 2019-10-06 ENCOUNTER — Ambulatory Visit: Payer: Medicare Other | Admitting: Gastroenterology

## 2019-10-06 VITALS — BP 128/77 | HR 96 | Ht 71.0 in | Wt 196.4 lb

## 2019-10-06 DIAGNOSIS — D509 Iron deficiency anemia, unspecified: Secondary | ICD-10-CM

## 2019-10-06 NOTE — Progress Notes (Signed)
Jon [name]  Chief Complaint: Anemia  Subjective  History: Seen in office consult December 2020 for primary care concerns of anemia with hemoglobin 12.6 and MCV 79, which had been his baseline.  Hemoglobin had been briefly lower 11.7 during December 2018 hospitalization for CVA.  Patient had no localizing GI symptoms at the time of my evaluation, no endoscopic work-up planned. He was hospitalized earlier this month with an acute left thalamic CVA neurology recommended DAPT with aspirin and Plavix for 3 weeks, then Plavix alone afterwards.  (Discharge summary was reviewed) he was also hypertensive during that hospitalization, and hemoglobin A1c found to be 7.1.  His CBC was stable.  Jon [name] says he is mostly recovered from the recent CVA.  He still gets intermittent slurred speech if he talks for too long or gets tired.  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 As before, he denies abdominal pain, altered bowel habits, black or bloody stool, dysphagia, odynophagia, nausea, vomiting, early satiety or weight loss.  He tells me that after the primary care visit in April he sent in a stool card to [facility] as requested by primary care, but he does not know the results.  I also do not find that result anywhere in his chart.  ROS: Cardiovascular:  no chest pain Respiratory: no dyspnea Residual neurologic symptoms as noted above Remainder of systems are negative except as noted above The patient's Past Medical, Family and Social History were reviewed and are on file in the EMR.  Objective:  Med list reviewed  Current Outpatient Medications:  .  acetaminophen-codeine (TYLENOL #3) 300-30 MG tablet, Take 1-2 tablets by mouth every 8 (eight) hours as needed for moderate pain or severe pain., Disp: 60 tablet, Rfl: 0 .  amLODipine (NORVASC) 10 MG tablet, Take 10 mg by mouth daily., Disp: , Rfl:  .  aspirin EC 81 MG EC tablet, Take 1 tablet (81 mg total)  by mouth daily., Disp: 30 tablet, Rfl: 0 .  atorvastatin (LIPITOR) 80 MG tablet, Take 1 tablet (80 mg total) by mouth daily., Disp: 30 tablet, Rfl: 0 .  Blood Glucose Monitoring Suppl (ONETOUCH VERIO) w/Device KIT, Use to check blood sugar daily., Disp: 1 kit, Rfl: 0 .  clopidogrel (PLAVIX) 75 MG tablet, TAKE 1 TABLET BY MOUTH  DAILY (Patient taking differently: Take 75 mg by mouth daily. ), Disp: 90 tablet, Rfl: 3 .  Dulaglutide (TRULICITY) 6.65 LD/3.5TS SOPN, INJECT SUBCUTANEOUSLY 0.75  MG ONCE WEEKLY (Patient taking differently: Inject 0.75 mg into the skin every Sunday. ), Disp: 6 mL, Rfl: 0 .  glimepiride (AMARYL) 4 MG tablet, TAKE 1 TABLET BY MOUTH  DAILY WITH BREAKFAST (Patient taking differently: Take 4 mg by mouth daily with breakfast. ), Disp: 90 tablet, Rfl: 3 .  glucose blood (ONETOUCH VERIO) test strip, Use as instructed to check blood sugar daily., Disp: 100 each, Rfl: 3 .  lidocaine (LIDODERM) 5 %, Place 1 patch onto the skin daily. (Patient taking differently: Place 1 patch onto the skin daily as needed (for pain- to affected site). ), Disp: 30 patch, Rfl: 0 .  losartan (COZAAR) 50 MG tablet, Take 50 mg by mouth daily., Disp: , Rfl:  .  metFORMIN (GLUCOPHAGE) 1000 MG tablet, Take 1 tablet (1,000 mg total) by mouth 2 (two) times daily with a meal., Disp: 180 tablet, Rfl: 1 .  OneTouch Delica Lancets 17B MISC, Use as instructed to check blood sugar once daily., Disp:  100 each, Rfl: 3   Vital signs in last 24 hrs: Vitals:   10/06/19 0815  BP: 128/77  Pulse: 96  SpO2: 98%    Physical Exam  He is well-appearing, ambulatory, affect seems decreased from before.  He is less effusive than our last interaction.  He gets on the exam table without difficulty.  Speech fluent.  HEENT: sclera anicteric, oral mucosa moist without lesions  Neck: supple, no thyromegaly, JVD or lymphadenopathy  Cardiac: RRR without murmurs, S1S2 heard, no peripheral edema  Pulm: clear to auscultation  bilaterally, normal RR and effort noted  Abdomen: soft, no tenderness, with active bowel sounds. No guarding or palpable hepatosplenomegaly.  Labs:  CBC Latest Ref Rng & Units 09/17/2019 09/17/2019 09/17/2019  WBC 4.0 - 10.5 K/uL 5.9 - 7.3  Hemoglobin 13.0 - 17.0 g/dL 12.9(L) 15.0 12.7(L)  Hematocrit 39.0 - 52.0 % 42.8 44.0 42.7  Platelets 150 - 400 K/uL 303 - 286   CMP Latest Ref Rng & Units 09/17/2019 09/17/2019 09/17/2019  Glucose 70 - 99 mg/dL - 222(H) 231(H)  BUN 8 - 23 mg/dL - 15 14  Creatinine 0.61 - 1.24 mg/dL 0.84 0.90 1.02  Sodium 135 - 145 mmol/L - 140 136  Potassium 3.5 - 5.1 mmol/L - 4.5 4.4  Chloride 98 - 111 mmol/L - 104 104  CO2 22 - 32 mmol/L - - 21(L)  Calcium 8.9 - 10.3 mg/dL - - 9.1  Total Protein 6.5 - 8.1 g/dL - - 8.0  Total Bilirubin 0.3 - 1.2 mg/dL - - 0.7  Alkaline Phos 38 - 126 U/L - - 53  AST 15 - 41 U/L - - 19  ALT 0 - 44 U/L - - 20   Iron/TIBC/Ferritin/ %Sat    Component Value Date/Time   IRON 42 09/14/2019 1619   TIBC 372 09/14/2019 1619   FERRITIN 36 09/14/2019 1619   IRONPCTSAT 11 (L) 09/14/2019 1619   Normal iron saturation 15% at this lab B12 normal ___________________________________________ Radiologic studies:   ____________________________________________ Other:   _____________________________________________ Assessment & Plan  Assessment: Encounter Diagnosis  Name Primary?  . Microcytic anemia Yes   Anemia with MCV low normal or sometimes just below normal, mildly decreased iron saturation but with normal iron level and ferritin.  He has no localizing GI symptoms.  I doubt this is anemia is on the basis of chronic GI blood loss, and suspect he probably has thalassemia.  Nevertheless, stool cards were ordered by primary care and apparently done by patient.  If positive, we will have no choice but to proceed with upper endoscopy and colonoscopy.  Those procedures would be increased risk due to his recent CVA.   Plan: This [name] will  be forwarded to primary care with my thoughts and suggestion that they could obtain a hemoglobin electrophoresis to look for thalassemia.  I do not believe it would change his management, but would give him a diagnosis. My request of them is also that they track down the results of his stool cards done through [facility] by his report.  If positive, make me aware and we will then schedule endoscopic procedures. All of this was discussed with the patient and questions were answered.   25 minutes were spent on this encounter (including chart review, history/exam, counseling/coordination of care, and documentation)  Nelida Meuse III

## 2019-10-06 NOTE — Patient Instructions (Signed)
If you are age 66 or older, your body mass index should be between 23-30. Your Body mass index is 27.39 kg/m. If this is out of the aforementioned range listed, please consider follow up with your Primary Care Provider.  If you are age 77 or younger, your body mass index should be between 19-25. Your Body mass index is 27.39 kg/m. If this is out of the aformentioned range listed, please consider follow up with your Primary Care Provider.   It was a pleasure to see you today!   Dr. Loletha Carrow

## 2019-10-15 ENCOUNTER — Encounter: Payer: Self-pay | Admitting: Nurse Practitioner

## 2019-10-16 ENCOUNTER — Other Ambulatory Visit: Payer: Self-pay | Admitting: Nurse Practitioner

## 2019-10-16 DIAGNOSIS — E119 Type 2 diabetes mellitus without complications: Secondary | ICD-10-CM

## 2019-10-16 MED ORDER — TRULICITY 0.75 MG/0.5ML ~~LOC~~ SOAJ: SUBCUTANEOUS | 3 refills | Status: DC

## 2019-10-16 MED FILL — TRULICITY 0.75 MG/0.5 ML PE: 0.75 | 30 days supply | Qty: 2 | Fill #0

## 2019-10-17 ENCOUNTER — Other Ambulatory Visit: Payer: Self-pay | Admitting: Family Medicine

## 2019-10-20 ENCOUNTER — Other Ambulatory Visit: Payer: Self-pay | Admitting: Nurse Practitioner

## 2019-10-21 ENCOUNTER — Encounter: Payer: Self-pay | Admitting: Adult Health

## 2019-10-21 ENCOUNTER — Ambulatory Visit: Payer: Medicare Other | Admitting: Adult Health

## 2019-10-21 VITALS — BP 156/87 | HR 100 | Ht 71.0 in | Wt 199.0 lb

## 2019-10-21 DIAGNOSIS — I6381 Other cerebral infarction due to occlusion or stenosis of small artery: Secondary | ICD-10-CM

## 2019-10-21 DIAGNOSIS — E1165 Type 2 diabetes mellitus with hyperglycemia: Secondary | ICD-10-CM | POA: Diagnosis not present

## 2019-10-21 DIAGNOSIS — Z794 Long term (current) use of insulin: Secondary | ICD-10-CM | POA: Diagnosis not present

## 2019-10-21 DIAGNOSIS — I639 Cerebral infarction, unspecified: Secondary | ICD-10-CM | POA: Diagnosis not present

## 2019-10-21 DIAGNOSIS — I10 Essential (primary) hypertension: Secondary | ICD-10-CM | POA: Diagnosis not present

## 2019-10-21 DIAGNOSIS — E785 Hyperlipidemia, unspecified: Secondary | ICD-10-CM

## 2019-10-21 NOTE — Progress Notes (Signed)
[facility] [address] [place]. [place] [address] [phone]       HOSPITAL FOLLOW UP NOTE  [name] Date of Birth:  January 05, 1956 Medical Record Number:  [id number]   Reason for Referral:  hospital stroke follow up    SUBJECTIVE:   CHIEF COMPLAINT:  Chief Complaint  Patient presents with  . Follow-up    Rm 9 here for a f/u from a stroke. Pt is having no new sx.    HPI:   [name] is a 64 y.o. male with history of stroke involving the right side with no residual deficits in 2018, diabetes, hypertension, hyperlipidemia  who presented on 09/17/2019 with Right-sided weakness and numbness.  Stroke work-up revealed left chronic infarct secondary to small vessel disease source.  Previously on clopidogrel and recommended DAPT for 3 weeks and Plavix alone.  Prior stroke history in 04/2017 lacunar infarct posterior limb left internal capsule/lateral thalamus secondary to small vessel disease without residual deficits.  History of HTN stable.  History of HLD previously on atorvastatin 40 mg daily with LDL 99 and increase atorvastatin dosage to 80 mg daily.  Uncontrolled DM with A1c 7.8 recommend close PCP follow-up.  Stroke risk factors include advanced age, EtOH use, and family history of stroke.  Other active problems include normocytic normochromic anemia.  Residual deficits of mild decreased right hand dexterity with therapy recommendation of outpatient OT and discharged home in stable condition.  Stroke:   L thalamic infarct secondary to small vessel disease source  CT head No acute abnormality. Old L caudate, L thalamic, L lentiform nucleus, infarct. Small vessel disease.   MRI  Small L thalamic infarct. Small vessel disease. Atrophy. Old same location infarcts.  CTA head & neck no LVO. No sign stenosis   2D Echo EF 60-65%. No source of embolus   LDL 99  HgbA1c 7.8  Lovenox 40 mg sq daily for VTE prophylaxis  clopidogrel 75 mg daily prior  to admission, now on aspirin 81 mg daily and clopidogrel 75 mg daily. Continue DAPT x 3 weeks then plavix alone.    Therapy recommendations:  outpt OT  Disposition:  Return home  Today, 10/21/2019, Jon [name] is being seen for stroke follow-up.  He has been doing well since discharge with improvement of deficits with only mild right finger tip numbness, slightly decreased dexterity and mild right facial droop.  Denies new or worsening stroke/TIA symptoms. He has returned back to all prior activities without difficulty including working. Completed 3 weeks DAPT and continues on Plavix alone without bleeding or bruising.  Continues on atorvastatin 80 mg daily without myalgias.  Blood pressure today 156/87.  Glucose levels stable. Continues to follow with PCP for DM, HTN and HLD management.  No concerns at this time.     ROS:   14 system review of systems performed and negative with exception of numbness/tingling  PMH:  Past Medical History:  Diagnosis Date  . Diabetes mellitus without complication ([place])    type II  . History of kidney stones   . Hyperlipidemia   . Hypertension   . Kidney stones   . Stomach ulcer   . Stroke [facility])    Dec. 14 2018    PSH:  Past Surgical History:  Procedure Laterality Date  . CYSTOSCOPY Left 12/10/2016   Procedure: cystoscopy with left ureteral stone extraction ;  Surgeon: Kathie Rhodes, MD;  Location: [facility] ORS;  Service: Urology;  Laterality: Left;  . HERNIA REPAIR    .  Stomach ulcer      Social History:  Social History   Socioeconomic History  . Marital status: Divorced    Spouse name: Not on file  . Number of children: 4  . Years of education: 37  . Highest education level: Not on file  Occupational History  . Occupation: Unemployed  Tobacco Use  . Smoking status: Never Smoker  . Smokeless tobacco: Never Used  Substance and Sexual Activity  . Alcohol use: Yes    Alcohol/week: 1.0 - 2.0 standard drinks    Types: 1 - 2 Glasses of wine per  week    Comment: socially  . Drug use: No  . Sexual activity: Not Currently  Other Topics Concern  . Not on file  Social History Narrative   Fun: Office manager   Social Determinants of Health   Financial Resource Strain:   . Difficulty of Paying Living Expenses:   Food Insecurity:   . Worried About Charity fundraiser in the Last Year:   . Arboriculturist in the Last Year:   Transportation Needs:   . Film/video editor (Medical):   Marland Kitchen Lack of Transportation (Non-Medical):   Physical Activity:   . Days of Exercise per Week:   . Minutes of Exercise per Session:   Stress:   . Feeling of Stress :   Social Connections:   . Frequency of Communication with Friends and Family:   . Frequency of Social Gatherings with Friends and Family:   . Attends Religious Services:   . Active Member of Clubs or Organizations:   . Attends Archivist Meetings:   Marland Kitchen Marital Status:   Intimate Partner Violence:   . Fear of Current or Ex-Partner:   . Emotionally Abused:   Marland Kitchen Physically Abused:   . Sexually Abused:     Family History:  Family History  Problem Relation Age of Onset  . Diabetes Father   . Stroke Father   . Hypertension Father   . Hypertension Mother   . Diabetes Sister   . Diabetes Son   . Diabetes Daughter     Medications:   Current Outpatient Medications on File Prior to Visit  Medication Sig Dispense Refill  . amLODipine (NORVASC) 10 MG tablet Take 10 mg by mouth daily.    . Blood Glucose Monitoring Suppl (ONETOUCH VERIO) w/Device KIT Use to check blood sugar daily. 1 kit 0  . clopidogrel (PLAVIX) 75 MG tablet TAKE 1 TABLET BY MOUTH  DAILY (Patient taking differently: Take 75 mg by mouth daily. ) 90 tablet 3  . Dulaglutide (TRULICITY) 1.61 WR/6.0AV SOPN INJECT SUBCUTANEOUSLY 0.75  MG ONCE WEEKLY E11.65 6 mL 3  . glimepiride (AMARYL) 4 MG tablet TAKE 1 TABLET BY MOUTH  DAILY WITH BREAKFAST (Patient taking differently: Take 4 mg by mouth daily with breakfast. ) 90  tablet 3  . glucose blood (ONETOUCH VERIO) test strip Use as instructed to check blood sugar daily. 100 each 3  . losartan (COZAAR) 50 MG tablet TAKE 1 TABLET BY MOUTH  DAILY 90 tablet 3  . metFORMIN (GLUCOPHAGE) 1000 MG tablet Take 1 tablet (1,000 mg total) by mouth 2 (two) times daily with a meal. 180 tablet 1  . OneTouch Delica Lancets 40J MISC Use as instructed to check blood sugar once daily. 100 each 3  . atorvastatin (LIPITOR) 80 MG tablet Take 1 tablet (80 mg total) by mouth daily. 30 tablet 0   No current facility-administered medications on file prior  to visit.    Allergies:  No Known Allergies    OBJECTIVE:  Physical Exam  Vitals:   10/21/19 0857  BP: (!) 156/87  Pulse: 100  Weight: 199 lb (90.3 kg)  Height: '5\' 11"'$  (1.803 m)   Body mass index is 27.75 kg/m. No exam data present  General: well developed, well nourished,  pleasant middle-aged man, seated, in no evident distress Head: head normocephalic and atraumatic.   Neck: supple with no carotid or supraclavicular bruits Cardiovascular: regular rate and rhythm, no murmurs Musculoskeletal: no deformity Skin:  no rash/petichiae Vascular:  Normal pulses all extremities   Neurologic Exam Mental Status: Awake and fully alert.   Fluent speech and language.  Oriented to place and time. Recent and remote memory intact. Attention span, concentration and fund of knowledge appropriate. Mood and affect appropriate.  Cranial Nerves: Fundoscopic exam reveals sharp disc margins. Pupils equal, briskly reactive to light. Extraocular movements full without nystagmus. Visual fields full to confrontation. Hearing intact. Facial sensation intact.  Right nasolabial fold flattening Motor: Normal bulk and tone. Normal strength in all tested extremity muscles. Sensory.: intact to touch , pinprick , position and vibratory sensation.  Coordination: Rapid alternating movements normal in all extremities except slightly diminished right hand  dexterity. Finger-to-nose and heel-to-shin performed accurately bilaterally. Gait and Station: Arises from chair without difficulty. Stance is normal. Gait demonstrates normal stride length and balance Reflexes: 1+ and symmetric. Toes downgoing.     NIHSS  1 Modified Rankin  1      ASSESSMENT: Jarquis Saling is a 64 y.o. year old male presented with right-sided numbness and weakness on 09/17/2019 with stroke work-up revealing left thalamic infarct secondary to small vessel disease source. Vascular risk factors include prior stroke 04/2017, HTN, HLD and DM.  Residual deficits of mild right hand fingertip numbness, slight decreased right hand dexterity and mild right facial weakness     PLAN:  1. L thalamic stroke: Continue clopidogrel 75 mg daily  and atorvastatin 80 mg daily for secondary stroke prevention. Maintain strict control of hypertension with blood pressure goal below 130/90, diabetes with hemoglobin A1c goal below 6.5% and cholesterol with LDL cholesterol (bad cholesterol) goal below 70 mg/dL.  I also advised the patient to eat a healthy diet with plenty of whole grains, cereals, fruits and vegetables, exercise regularly with at least 30 minutes of continuous activity daily and maintain ideal body weight. 2. HTN: Continue to follow with PCP for monitoring and management. 3. HLD: Continuation of atorvastatin 80 mg daily and ensure follow-up with PCP for repeat lipid panel and prescribing of atorvastatin 4. DMII: Continue to follow with PCP for monitoring and management    Follow up in 4 months or call earlier if needed   I spent 45 minutes of face-to-face and non-face-to-face time with patient.  This included previsit chart review, lab review, study review, order entry, electronic health record documentation, patient education regarding recent stroke, residual deficits, importance of managing stroke risk factors and answered all questions to patient satisfaction     Frann Rider, [facility]  [facility] [address] [place], [place] [address]  Phone [phone] Fax [phone] Note: This document was prepared with digital dictation and possible smart phrase technology. Any transcriptional errors that result from this process are unintentional.

## 2019-10-21 NOTE — Patient Instructions (Signed)
Continue clopidogrel 75 mg daily  and atorvastatin 80 mg daily for secondary stroke prevention  Continue to follow up with PCP regarding cholesterol, blood pressure and diabetes management   Continue to monitor blood pressure and glucose levels at home  Maintain strict control of hypertension with blood pressure goal below 130/90, diabetes with hemoglobin A1c goal below 6.5% and cholesterol with LDL cholesterol (bad cholesterol) goal below 70 mg/dL. I also advised the patient to eat a healthy diet with plenty of whole grains, cereals, fruits and vegetables, exercise regularly and maintain ideal body weight.  Followup in the future with me in 4 months or call earlier if needed       Thank you for coming to see [place] at [facility]. I hope we have been able to provide you high quality care today.  You may receive a patient satisfaction survey over the next few weeks. We would appreciate your feedback and comments so that we may continue to improve ourselves and the health of our patients.

## 2019-10-26 NOTE — Progress Notes (Signed)
I agree with the above plan 

## 2019-11-03 ENCOUNTER — Encounter: Payer: Self-pay | Admitting: Nurse Practitioner

## 2019-11-03 ENCOUNTER — Ambulatory Visit: Payer: Medicare Other | Attending: Nurse Practitioner | Admitting: Nurse Practitioner

## 2019-11-03 ENCOUNTER — Other Ambulatory Visit: Payer: Self-pay

## 2019-11-03 VITALS — BP 133/78 | HR 83 | Temp 97.7°F | Ht 71.0 in | Wt 196.0 lb

## 2019-11-03 DIAGNOSIS — E119 Type 2 diabetes mellitus without complications: Secondary | ICD-10-CM

## 2019-11-03 DIAGNOSIS — I63332 Cerebral infarction due to thrombosis of left posterior cerebral artery: Secondary | ICD-10-CM | POA: Diagnosis not present

## 2019-11-03 LAB — GLUCOSE, POCT (MANUAL RESULT ENTRY): POC Glucose: 91 mg/dl (ref 70–99)

## 2019-11-03 MED ORDER — ATORVASTATIN CALCIUM 80 MG PO TABS: 80.0000 mg | ORAL_TABLET | Freq: Every day | ORAL | 2 refills | Status: DC

## 2019-11-03 MED ORDER — GLIMEPIRIDE 4 MG PO TABS: 4.0000 mg | ORAL_TABLET | Freq: Every day | ORAL | 3 refills | Status: DC

## 2019-11-03 NOTE — Progress Notes (Signed)
Assessment & Plan:  [name] was seen today for follow-up.  Diagnoses and all orders for this visit:  Type 2 diabetes mellitus without complication, without long-term current use of insulin (HCC) -     Glucose (CBG) -     glimepiride (AMARYL) 4 MG tablet; Take 1 tablet (4 mg total) by mouth daily with breakfast. -     Ambulatory referral to Podiatry  Cerebrovascular accident (CVA) due to thrombosis of left posterior cerebral artery (HCC) -     atorvastatin (LIPITOR) 80 MG tablet; Take 1 tablet (80 mg total) by mouth daily.    Patient has been counseled on age-appropriate routine health concerns for screening and prevention. These are reviewed and up-to-date. Referrals have been placed accordingly. Immunizations are up-to-date or declined.    Subjective:   Chief Complaint  Patient presents with  . Follow-up    Pt. is here for diabetes follow up.    [name] 64 y.o. male presents to office today for follow up. Unfortunately he suffered a left thalamic infarct last month. He has a history of previous left stroke with no right sided residual deficits in 2018. He is being followed by neurology. Continues on plavix. Atorvastatin increased to high intensity 80 mg. No current residual deficits.   DM TYPE 2 Well controlled however goal is <7. He is taking trulicity 5.91 mg weekly, amaryl 4 mg daily and metformin 1000 mg BID. Endorsing bilateral peripheral neuropathy today. Intensity 5/10. WIll continue to monitor at this time only. Goal is to decrease A1c to prevent or reduce risk of further complications. He does have his meter today with 90 day average 110. Overdue for eye exam. I have recommended he return to his previous eye specialist for follow up. Last exam 04-2018. He was referred to podiatry today for foot exam. On STATIN and ARB. LDL not quite at goal.  Lab Results  Component Value Date   HGBA1C 7.8 (H) 09/18/2019   Lab Results  Component Value Date   LDLCALC 99  08/24/2019    Essential hypertension Well controlled with losartan 50 mg daily and amlodipine 10 mg daily.  BP Readings from Last 3 Encounters:  11/03/19 133/78  10/21/19 (!) 156/87  10/06/19 128/77       Review of Systems  Constitutional: Negative for fever, malaise/fatigue and weight loss.  HENT: Negative.  Negative for nosebleeds.   Eyes: Negative.  Negative for blurred vision, double vision and photophobia.  Respiratory: Negative.  Negative for cough and shortness of breath.   Cardiovascular: Negative.  Negative for chest pain, palpitations and leg swelling.  Gastrointestinal: Negative.  Negative for heartburn, nausea and vomiting.  Musculoskeletal: Negative.  Negative for myalgias.  Neurological: Positive for tingling and sensory change. Negative for dizziness, focal weakness, seizures and headaches.  Psychiatric/Behavioral: Negative.  Negative for suicidal ideas.    Past Medical History:  Diagnosis Date  . Diabetes mellitus without complication ([place])    type II  . History of kidney stones   . Hyperlipidemia   . Hypertension   . Kidney stones   . Stomach ulcer   . Stroke [facility])    Dec. 14 2018    Past Surgical History:  Procedure Laterality Date  . CYSTOSCOPY Left 12/10/2016   Procedure: cystoscopy with left ureteral stone extraction ;  Surgeon: Kathie Rhodes, MD;  Location: [facility];  Service: Urology;  Laterality: Left;  . HERNIA REPAIR    . Stomach ulcer      Family History  Problem Relation Age of Onset  . Diabetes Father   . Stroke Father   . Hypertension Father   . Hypertension Mother   . Diabetes Sister   . Diabetes Son   . Diabetes Daughter     Social History Reviewed with no changes to be made today.   Outpatient Medications Prior to Visit  Medication Sig Dispense Refill  . amLODipine (NORVASC) 10 MG tablet Take 10 mg by mouth daily.    . Blood Glucose Monitoring Suppl (ONETOUCH VERIO) w/Device KIT Use to check blood sugar daily. 1 kit 0  .  clopidogrel (PLAVIX) 75 MG tablet TAKE 1 TABLET BY MOUTH  DAILY (Patient taking differently: Take 75 mg by mouth daily. ) 90 tablet 3  . Dulaglutide (TRULICITY) 9.14 NW/2.9FA SOPN INJECT SUBCUTANEOUSLY 0.75  MG ONCE WEEKLY E11.65 6 mL 3  . glucose blood (ONETOUCH VERIO) test strip Use as instructed to check blood sugar daily. 100 each 3  . losartan (COZAAR) 50 MG tablet TAKE 1 TABLET BY MOUTH  DAILY 90 tablet 3  . metFORMIN (GLUCOPHAGE) 1000 MG tablet Take 1 tablet (1,000 mg total) by mouth 2 (two) times daily with a meal. 180 tablet 1  . OneTouch Delica Lancets 21H MISC Use as instructed to check blood sugar once daily. 100 each 3  . glimepiride (AMARYL) 4 MG tablet TAKE 1 TABLET BY MOUTH  DAILY WITH BREAKFAST (Patient taking differently: Take 4 mg by mouth daily with breakfast. ) 90 tablet 3  . atorvastatin (LIPITOR) 80 MG tablet Take 1 tablet (80 mg total) by mouth daily. 30 tablet 0   No facility-administered medications prior to visit.    No Known Allergies     Objective:    BP 133/78 (BP Location: Left Arm, Patient Position: Sitting, Cuff Size: Normal)   Pulse 83   Temp 97.7 F (36.5 C) (Temporal)   Ht _0  (1.803 m)   Wt 196 lb (88.9 kg)   SpO2 97%   BMI 27.34 kg/m  Wt Readings from Last 3 Encounters:  11/03/19 196 lb (88.9 kg)  10/21/19 199 lb (90.3 kg)  10/06/19 196 lb 6.4 oz (89.1 kg)    Physical Exam Vitals and nursing note reviewed.  Constitutional:      Appearance: He is well-developed.  HENT:     Head: Normocephalic and atraumatic.  Cardiovascular:     Rate and Rhythm: Normal rate and regular rhythm.     Heart sounds: Normal heart sounds. No murmur heard.  No friction rub. No gallop.   Pulmonary:     Effort: Pulmonary effort is normal. No tachypnea or respiratory distress.     Breath sounds: Normal breath sounds. No decreased breath sounds, wheezing, rhonchi or rales.  Chest:     Chest wall: No tenderness.  Abdominal:     General: Bowel sounds are  normal.     Palpations: Abdomen is soft.  Musculoskeletal:        General: Normal range of motion.     Cervical back: Normal range of motion.  Skin:    General: Skin is warm and dry.  Neurological:     Mental Status: He is alert and oriented to person, place, and time.     Cranial Nerves: Cranial nerves are intact.     Sensory: Sensation is intact.     Motor: Motor function is intact.     Coordination: Coordination is intact. Coordination normal.     Gait: Gait is intact.  Psychiatric:  Behavior: Behavior normal. Behavior is cooperative.        Thought Content: Thought content normal.        Judgment: Judgment normal.          Patient has been counseled extensively about nutrition and exercise as well as the importance of adherence with medications and regular follow-up. The patient was given clear instructions to go to ER or return to medical center if symptoms don't improve, worsen or new problems develop. The patient verbalized understanding.   Follow-up: Return in about 2 months (around 01/03/2020).   Gildardo Pounds, FNP-BC [facility] [place], [place]   11/03/2019, 2:57 PM

## 2019-11-12 MED FILL — TRULICITY 0.75 MG/0.5 ML PE: 0.75 | 30 days supply | Qty: 2 | Fill #1

## 2019-11-12 MED FILL — ONE TOUCH VERIO TEST STRIP: 31 days supply | Qty: 50 | Fill #3

## 2019-11-30 ENCOUNTER — Ambulatory Visit: Payer: Medicare Other | Admitting: Podiatrist

## 2019-11-30 ENCOUNTER — Other Ambulatory Visit: Payer: Self-pay

## 2019-11-30 ENCOUNTER — Encounter: Payer: Self-pay | Admitting: Podiatrist

## 2019-11-30 VITALS — Temp 95.4°F

## 2019-11-30 DIAGNOSIS — E119 Type 2 diabetes mellitus without complications: Secondary | ICD-10-CM

## 2019-11-30 NOTE — Progress Notes (Signed)
    Chief Complaint  Patient presents with  . Foot Problem    Tingling/stinging sensation - bilateral plantar midfoot to plantar forefoot. x1.5 months. Pt stated, "It resolved after I saw my PCP in June". No known history of neuropathy or ulcers.  . Diabetes    Most recent A1c per pt = 7.8.     HPI: Patient is 64 y.o. male who presents today for the concerns as listed above. The patient says he had tingling that has resolved after he saw his doctor in June.  He relates no shooting pain, no numbness reported.  He is diabetic.     Review of Systems No fevers, chills, nausea, muscle aches, no difficulty breathing, no calf pain, no chest pain or shortness of breath.   Physical Exam  GENERAL APPEARANCE: Alert, conversant. Appropriately groomed. No acute distress.   VASCULAR: Pedal pulses palpable DP and PT bilateral.  Capillary refill time is immediate to all digits,  Proximal to distal cooling it warm to warm.  Digital hair growth absent.  NEUROLOGIC: sensation is intact epicritically and protectively to 5.07 monofilament at 5/5 sites bilateral.  Light touch is intact bilateral, vibratory sensation intact bilateral, achilles tendon reflex is intact bilateral.  Negative tinnels sign with tapping along the tarsal canal bilaterally.  No neuroma symptoms elicited or palpated between metatarsal heads 1-5 bilaterally.   MUSCULOSKELETAL: acceptable muscle strength, tone and stability bilateral.  Pes planus foot type noted.  No gross boney pedal deformities noted.  No pain, crepitus or limitation noted with foot and ankle range of motion bilateral.   DERMATOLOGIC: skin is warm, supple, and dry.  No open lesions noted.  No rash, no pre ulcerative lesions. Digital nails are asymptomatic.      Assessment   Resolved tingling of the feet Diabetic foot exam  Plan  Discussed diabetic foot care and being sure to keep his blood sugar under good control.  If the tingling returns he will follow up.   Otherwise he will be seen back prn.

## 2019-12-15 MED FILL — ONE TOUCH VERIO TEST STRIP: 31 days supply | Qty: 50 | Fill #4

## 2019-12-15 MED FILL — TRULICITY 0.75 MG/0.5 ML PE: 0.75 | 30 days supply | Qty: 2 | Fill #2

## 2019-12-21 ENCOUNTER — Encounter: Payer: Self-pay | Admitting: Nurse Practitioner

## 2020-01-04 ENCOUNTER — Ambulatory Visit: Payer: Medicare Other | Attending: Nurse Practitioner | Admitting: Nurse Practitioner

## 2020-01-04 ENCOUNTER — Other Ambulatory Visit: Payer: Self-pay

## 2020-01-04 ENCOUNTER — Encounter: Payer: Self-pay | Admitting: Nurse Practitioner

## 2020-01-04 ENCOUNTER — Other Ambulatory Visit: Payer: Self-pay | Admitting: Family Medicine

## 2020-01-04 ENCOUNTER — Other Ambulatory Visit: Payer: Self-pay | Admitting: Nurse Practitioner

## 2020-01-04 DIAGNOSIS — D649 Anemia, unspecified: Secondary | ICD-10-CM | POA: Diagnosis not present

## 2020-01-04 DIAGNOSIS — E119 Type 2 diabetes mellitus without complications: Secondary | ICD-10-CM | POA: Diagnosis not present

## 2020-01-04 DIAGNOSIS — I63332 Cerebral infarction due to thrombosis of left posterior cerebral artery: Secondary | ICD-10-CM | POA: Diagnosis not present

## 2020-01-04 DIAGNOSIS — I6522 Occlusion and stenosis of left carotid artery: Secondary | ICD-10-CM | POA: Diagnosis not present

## 2020-01-04 MED ORDER — GLIMEPIRIDE 4 MG PO TABS: 8.0000 mg | ORAL_TABLET | Freq: Every day | ORAL | 2 refills | Status: DC

## 2020-01-04 NOTE — Telephone Encounter (Signed)
Requested medications are due for refill today? Listed as a historical medication.    Requested medications are on active medication list? Yes as a historical medication.    Last Refill:  Unknown.   Future visit scheduled? Yes  Notes to Clinic:  Unable to refill historical medications.

## 2020-01-04 NOTE — Telephone Encounter (Signed)
Requested Prescriptions  Pending Prescriptions Disp Refills   clopidogrel (PLAVIX) 75 MG tablet [Pharmacy Med Name: Clopidogrel Bisulfate 75 MG Oral Tablet] 90 tablet 0    Sig: TAKE 1 TABLET BY MOUTH  DAILY     Hematology: Antiplatelets - clopidogrel Failed - 01/04/2020 10:54 PM      Failed - Evaluate AST, ALT within 2 months of therapy initiation.      Failed - HGB in normal range and within 180 days    Hemoglobin  Date Value Ref Range Status  09/17/2019 12.9 (L) 13.0 - 17.0 g/dL Final  09/14/2019 12.7 (L) 13.0 - 17.7 g/dL Final         Passed - ALT in normal range and within 360 days    ALT  Date Value Ref Range Status  09/17/2019 20 0 - 44 U/L Final         Passed - AST in normal range and within 360 days    AST  Date Value Ref Range Status  09/17/2019 19 15 - 41 U/L Final         Passed - HCT in normal range and within 180 days    HCT  Date Value Ref Range Status  09/17/2019 42.8 39 - 52 % Final   Hematocrit  Date Value Ref Range Status  09/14/2019 40.6 37.5 - 51.0 % Final         Passed - PLT in normal range and within 180 days    Platelets  Date Value Ref Range Status  09/17/2019 303 150 - 400 K/uL Final  09/14/2019 292 150 - 450 x10E3/uL Final         Passed - Valid encounter within last 6 months    Recent Outpatient Visits          Today Carotid stenosis, left   [place], [place] W, NP   2 months ago Type 2 diabetes mellitus without complication, without long-term current use of insulin [facility])   [place], [place] W, NP   4 months ago Need for vaccination against Streptococcus pneumoniae using pneumococcal conjugate vaccine [place], Jarome Matin, RPH-CPP   4 months ago Encounter for annual physical exam   [place], [place] W, NP   4 months ago Type 2 diabetes mellitus without complication,  without long-term current use of insulin [facility])   [place], Vernia Buff, NP      Future Appointments            In 1 month Gildardo Pounds, NP [place]

## 2020-01-04 NOTE — Progress Notes (Signed)
Virtual Visit via Telephone Note Due to national recommendations of social distancing due to [place] 19, telehealth visit is felt to be most appropriate for this patient at this time.  I discussed the limitations, risks, security and privacy concerns of performing an evaluation and management service by telephone and the availability of in person appointments. I also discussed with the patient that there may be a patient responsible charge related to this service. The patient expressed understanding and agreed to proceed.    I connected with Jon [name] on 01/04/20  at  11:10 AM EDT  EDT by telephone and verified that I am speaking with the correct person using two identifiers.   Consent I discussed the limitations, risks, security and privacy concerns of performing an evaluation and management service by telephone and the availability of in person appointments. I also discussed with the patient that there may be a patient responsible charge related to this service. The patient expressed understanding and agreed to proceed.   Location of Patient: Private Residence    Location of Provider: [place] and [employer] Office    Persons participating in Telemedicine visit: [name] FNP-BC [place]    History of Present Illness: Telemedicine visit for:  DM and Possible TIA  PMH: DM2(HCC), History of kidney stones, Hyperlipidemia, Hypertension,  Stomach ulcer, and Thalamic Stroke ([place]), Recurrent CVA.  DM 2 Would like to discontinue Trulicity due to costs. At this time will have him increase amaryl to 8mg  daily. He will not start amaryl until he has completed the supply of Trulicity he still has at home. Reports meter 90 day average as 106. Lab Results  Component Value Date   HGBA1C 7.8 (H) 09/18/2019   TIA SYMPTOMS States he was coming from work 2 days ago after 6 pm. He reports he was aware that his foot was on the accelerator but when it was time to  slow down his brain could not process for him to brake with his foot and take it off the accelerator. After about a second he was able to process that he should press the brake. He did not seek Emergency or urgent care for this. He did not lose consciousness during this time nor was his car damaged. Currently denies any weakness, hemiparesis, Dysarthria, vision changes or memory loss. ABCD2 score 1%   CTA NECK 09-17-2019 Right carotid system: Patent. There is mild calcified plaque at the proximal ICA causing minimal stenosis.  Left carotid system: Patent. There is calcified and noncalcified plaque along the proximal ICA causing mild less than 50% stenosis. Stable small focal outpouching near the ICA origin, which could reflect an area of ulcerated plaque.  Vertebral arteries: Patent and codominant. Stable irregularity of the proximal left vertebral artery with moderate stenosis.  Past Medical History:  Diagnosis Date  . Diabetes mellitus without complication ([place])    type II  . History of kidney stones   . Hyperlipidemia   . Hypertension   . Kidney stones   . Stomach ulcer   . Stroke [facility])    Dec. 14 2018    Past Surgical History:  Procedure Laterality Date  . CYSTOSCOPY Left 12/10/2016   Procedure: cystoscopy with left ureteral stone extraction ;  Surgeon: Kathie Rhodes, MD;  Location: [facility];  Service: Urology;  Laterality: Left;  . HERNIA REPAIR    . Stomach ulcer      Family History  Problem Relation Age of Onset  . Diabetes Father   .  Stroke Father   . Hypertension Father   . Hypertension Mother   . Diabetes Sister   . Diabetes Son   . Diabetes Daughter     Social History   Socioeconomic History  . Marital status: Divorced    Spouse name: Not on file  . Number of children: 4  . Years of education: 30  . Highest education level: Not on file  Occupational History  . Occupation: Unemployed  Tobacco Use  . Smoking status: Never Smoker  . Smokeless tobacco:  Never Used  Vaping Use  . Vaping Use: Never used  Substance and Sexual Activity  . Alcohol use: Yes    Alcohol/week: 1.0 - 2.0 standard drink    Types: 1 - 2 Glasses of wine per week    Comment: socially  . Drug use: No  . Sexual activity: Not Currently  Other Topics Concern  . Not on file  Social History Narrative   Fun: Office manager   Social Determinants of Health   Financial Resource Strain:   . Difficulty of Paying Living Expenses: Not on file  Food Insecurity:   . Worried About Charity fundraiser in the Last Year: Not on file  . Ran Out of Food in the Last Year: Not on file  Transportation Needs:   . Lack of Transportation (Medical): Not on file  . Lack of Transportation (Non-Medical): Not on file  Physical Activity:   . Days of Exercise per Week: Not on file  . Minutes of Exercise per Session: Not on file  Stress:   . Feeling of Stress : Not on file  Social Connections:   . Frequency of Communication with Friends and Family: Not on file  . Frequency of Social Gatherings with Friends and Family: Not on file  . Attends Religious Services: Not on file  . Active Member of Clubs or Organizations: Not on file  . Attends Archivist Meetings: Not on file  . Marital Status: Not on file     Observations/Objective: Awake, alert and oriented x 3   Review of Systems  Constitutional: Negative for fever, malaise/fatigue and weight loss.  HENT: Negative.  Negative for nosebleeds.   Eyes: Negative.  Negative for blurred vision, double vision and photophobia.  Respiratory: Negative.  Negative for cough and shortness of breath.   Cardiovascular: Negative.  Negative for chest pain, palpitations and leg swelling.  Gastrointestinal: Negative.  Negative for heartburn, nausea and vomiting.  Musculoskeletal: Negative.  Negative for myalgias.  Neurological: Negative.  Negative for dizziness, focal weakness, seizures and headaches.  Psychiatric/Behavioral: Negative.  Negative  for suicidal ideas.    Assessment and Plan: [place] was seen today for follow-up.  Diagnoses and all orders for this visit:  Carotid stenosis, left -     Ambulatory referral to Vascular Surgery  Cerebrovascular accident (CVA) due to thrombosis of left posterior cerebral artery ([place]) -     Ambulatory referral to Vascular Surgery  Type 2 diabetes mellitus without complication, without long-term current use of insulin (HCC) -     glimepiride (AMARYL) 4 MG tablet; Take 2 tablets (8 mg total) by mouth daily with breakfast. -     Basic metabolic panel; Future -     Hemoglobin A1c; Future  Anemia, unspecified type -     CBC; Future     Follow Up Instructions Return in about 2 months (around 03/05/2020).     I discussed the assessment and treatment plan with the patient. The patient  was provided an opportunity to ask questions and all were answered. The patient agreed with the plan and demonstrated an understanding of the instructions.   The patient was advised to call back or seek an in-person evaluation if the symptoms worsen or if the condition fails to improve as anticipated.  I provided 17 minutes of non-face-to-face time during this encounter including median intraservice time, reviewing previous notes, labs, imaging, medications and explaining diagnosis and management.  Gildardo Pounds, FNP-BC

## 2020-01-11 ENCOUNTER — Ambulatory Visit: Payer: Medicare Other | Attending: Nurse Practitioner

## 2020-01-11 ENCOUNTER — Other Ambulatory Visit: Payer: Self-pay

## 2020-01-11 DIAGNOSIS — D649 Anemia, unspecified: Secondary | ICD-10-CM

## 2020-01-11 DIAGNOSIS — E119 Type 2 diabetes mellitus without complications: Secondary | ICD-10-CM | POA: Diagnosis not present

## 2020-01-11 MED FILL — TRULICITY 0.75 MG/0.5 ML PE: 0.75 | 28 days supply | Qty: 2 | Fill #3

## 2020-01-12 LAB — CBC
Hematocrit: 39.6 % (ref 37.5–51.0)
Hemoglobin: 12.3 g/dL — ABNORMAL LOW (ref 13.0–17.7)
MCH: 24.4 pg — ABNORMAL LOW (ref 26.6–33.0)
MCHC: 31.1 g/dL — ABNORMAL LOW (ref 31.5–35.7)
MCV: 78 fL — ABNORMAL LOW (ref 79–97)
Platelets: 265 10*3/uL (ref 150–450)
RBC: 5.05 x10E6/uL (ref 4.14–5.80)
RDW: 15.9 % — ABNORMAL HIGH (ref 11.6–15.4)
WBC: 5 10*3/uL (ref 3.4–10.8)

## 2020-01-12 LAB — HEMOGLOBIN A1C
Est. average glucose Bld gHb Est-mCnc: 166 mg/dL
Hgb A1c MFr Bld: 7.4 % — ABNORMAL HIGH (ref 4.8–5.6)

## 2020-01-12 LAB — LIPID PANEL
Chol/HDL Ratio: 3.7 ratio (ref 0.0–5.0)
Cholesterol, Total: 121 mg/dL (ref 100–199)
HDL: 33 mg/dL — ABNORMAL LOW (ref >39)
LDL Chol Calc (NIH): 72 mg/dL (ref 0–99)
Triglycerides: 78 mg/dL (ref 0–149)
VLDL Cholesterol Cal: 16 mg/dL (ref 5–40)

## 2020-01-12 LAB — BASIC METABOLIC PANEL
BUN/Creatinine Ratio: 19 (ref 10–24)
BUN: 16 mg/dL (ref 8–27)
CO2: 23 mmol/L (ref 20–29)
Calcium: 9.3 mg/dL (ref 8.6–10.2)
Chloride: 103 mmol/L (ref 96–106)
Creatinine, Ser: 0.83 mg/dL (ref 0.76–1.27)
GFR calc Af Amer: 107 mL/min/{1.73_m2} (ref >59)
GFR calc non Af Amer: 92 mL/min/{1.73_m2} (ref >59)
Glucose: 179 mg/dL — ABNORMAL HIGH (ref 65–99)
Potassium: 4.3 mmol/L (ref 3.5–5.2)
Sodium: 139 mmol/L (ref 134–144)

## 2020-01-19 ENCOUNTER — Ambulatory Visit: Payer: Medicare Other | Admitting: Adult Health

## 2020-01-19 ENCOUNTER — Encounter: Payer: Self-pay | Admitting: Adult Health

## 2020-01-19 ENCOUNTER — Other Ambulatory Visit: Payer: Self-pay

## 2020-01-19 VITALS — BP 146/80 | HR 92 | Ht 71.0 in | Wt 197.2 lb

## 2020-01-19 DIAGNOSIS — Z794 Long term (current) use of insulin: Secondary | ICD-10-CM

## 2020-01-19 DIAGNOSIS — E1165 Type 2 diabetes mellitus with hyperglycemia: Secondary | ICD-10-CM

## 2020-01-19 DIAGNOSIS — R404 Transient alteration of awareness: Secondary | ICD-10-CM

## 2020-01-19 DIAGNOSIS — I639 Cerebral infarction, unspecified: Secondary | ICD-10-CM | POA: Diagnosis not present

## 2020-01-19 DIAGNOSIS — I10 Essential (primary) hypertension: Secondary | ICD-10-CM

## 2020-01-19 DIAGNOSIS — E785 Hyperlipidemia, unspecified: Secondary | ICD-10-CM | POA: Diagnosis not present

## 2020-01-19 DIAGNOSIS — I6381 Other cerebral infarction due to occlusion or stenosis of small artery: Secondary | ICD-10-CM

## 2020-01-19 NOTE — Patient Instructions (Addendum)
Your recent episode could have been a presyncopal type of episode or possibly seizure type episode Would recommend obtaining EEG to rule out brain abnormality or seizures as your prior strokes can increase your risk of seizure activity If this event occurs again, please call 911 for further evaluation in the emergency room You may also feel these type of symptoms with low blood sugar or blood pressure levels -please ensure you continue to monitor these levels routinely throughout the day or with any type of symptom onset  Continue clopidogrel 75 mg daily  and atorvastatin 80 mg daily for secondary stroke prevention  Continue to follow up with PCP regarding blood pressure, cholesterol and diabetes management  Maintain strict control of hypertension with blood pressure goal below 130/90, diabetes with hemoglobin A1c goal below 7.0% and cholesterol with LDL cholesterol (bad cholesterol) goal below 70 mg/dL.       Followup in the future with me in 3 months or call earlier if needed     Thank you for coming to see [place] at [facility]. I hope we have been able to provide you high quality care today.  You may receive a patient satisfaction survey over the next few weeks. We would appreciate your feedback and comments so that we may continue to improve ourselves and the health of our patients.     Electroencephalogram, Adult An electroencephalogram (EEG) is a test that records electrical activity in the brain. It is often used to diagnose or monitor problems that are related to the brain, such as:  Seizure disorders.  Sleeping problems.  Changes in behavior.  Head injuries.  Fainting spells. Tell a health care provider about:  Any allergies you have.  All medicines you are taking, including vitamins, herbs, eye drops, creams, and over-the-counter medicines.  Any problems you or family members have had with anesthetic medicines.  Any blood disorders you  have.  Any surgeries you have had.  Any medical conditions you have or have had, including psychiatric conditions.  Any history of illegal drug use or alcohol abuse. What are the risks? Generally, this is a safe test. If you have a seizure disorder, you may be made to have a seizure during the test. This is done so that your brain activity can be recorded during the seizure. What happens before the procedure?   Arrive with your hair clean and dry. Do not comb your hair toward your scalp to add volume (backcomb), and do not put hair spray, oil, or other hair products in your hair.  Do not have caffeine within 4 hours of having your test.  Follow instructions from your health care provider about: ? Other eating or drinking restrictions. ? Sleeping before the test.  Ask your health care provider about taking your regular and over-the-counter medicines, herbs, and supplements. What happens during the procedure? You will be asked to sit in a chair or lie down. Small metal discs (electrodes) will be attached to your head with an adhesive. These electrodes will pick up on the signals in your brain, and a machine will record the signals. During the test, you may be asked to:  Sit or lie quietly and relax.  Open and close your eyes.  Breathe deeply and rapidly for 3 minutes or longer.  Look at a flashing light for a short period of time.  Read or look at an image.  Go to sleep. When the test is complete, the electrodes will be removed by using a solution  such as acetone. What happens after the procedure? It is up to you to get the results of your procedure. Ask your health care provider, or the department that is doing the procedure, when your results will be ready. Summary  An electroencephalogram (EEG) is a test that is often used to diagnose or monitor problems that are related to the brain.  Do not have caffeine within 4 hours of having your test. Follow other instructions from  your health care provider about eating and drinking before the test.  During the procedure, small metal discs (electrodes) will be attached to your head with an adhesive.  During the test, you may be asked to sit or lie quietly and relax. You may also be asked to do other activities during the test. This information is not intended to replace advice given to you by your health care provider. Make sure you discuss any questions you have with your health care provider. Document Revised: 03/04/2018 Document Reviewed: 03/04/2018 Elsevier Patient Education  2020 [employer].
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
## 2020-01-19 NOTE — Progress Notes (Signed)
I agree with the above plan 

## 2020-02-08 ENCOUNTER — Other Ambulatory Visit: Payer: Self-pay | Admitting: Nurse Practitioner

## 2020-02-08 ENCOUNTER — Other Ambulatory Visit: Payer: Self-pay | Admitting: Family Medicine

## 2020-02-08 ENCOUNTER — Other Ambulatory Visit: Payer: Medicare Other

## 2020-02-08 DIAGNOSIS — E119 Type 2 diabetes mellitus without complications: Secondary | ICD-10-CM

## 2020-02-08 MED FILL — ONE TOUCH VERIO TEST STRIP: 30 days supply | Qty: 50 | Fill #0

## 2020-02-08 NOTE — Telephone Encounter (Signed)
Requested Prescriptions  Pending Prescriptions Disp Refills  . ONETOUCH VERIO test strip [employer] Med Name: ONE TOUCH VERIO TEST STRIP Strip] 50 strip 3    Sig: USE AS INSTRUCTED TO CHECK BLOOD SUGAR DAILY.     Endocrinology: Diabetes - Testing Supplies Passed - 02/08/2020  9:57 AM      Passed - Valid encounter within last 12 months    Recent Outpatient Visits          1 month ago Carotid stenosis, left   [place], [place] W, NP   3 months ago Type 2 diabetes mellitus without complication, without long-term current use of insulin [facility])   [place], [place] W, NP   5 months ago Need for vaccination against Streptococcus pneumoniae using pneumococcal conjugate vaccine [place], Jarome [place]atin, RPH-CPP   5 months ago Encounter for annual physical exam   [place], [place] W, NP   5 months ago Type 2 diabetes mellitus without complication, without long-term current use of insulin [facility])   [place], Vernia Buff, NP      Future Appointments            In 1 week Gildardo Pounds, NP [employer]

## 2020-02-15 ENCOUNTER — Encounter: Payer: Self-pay | Admitting: Nurse Practitioner

## 2020-02-15 ENCOUNTER — Ambulatory Visit: Payer: Medicare Other | Attending: Nurse Practitioner | Admitting: Nurse Practitioner

## 2020-02-15 ENCOUNTER — Other Ambulatory Visit: Payer: Self-pay

## 2020-02-15 VITALS — BP 147/85 | HR 98 | Temp 97.7°F | Ht 71.0 in | Wt 196.0 lb

## 2020-02-15 DIAGNOSIS — I10 Essential (primary) hypertension: Secondary | ICD-10-CM

## 2020-02-15 DIAGNOSIS — E119 Type 2 diabetes mellitus without complications: Secondary | ICD-10-CM

## 2020-02-15 DIAGNOSIS — I63332 Cerebral infarction due to thrombosis of left posterior cerebral artery: Secondary | ICD-10-CM

## 2020-02-15 LAB — GLUCOSE, POCT (MANUAL RESULT ENTRY): POC Glucose: 103 mg/dl — AB (ref 70–99)

## 2020-02-15 MED ORDER — CLOPIDOGREL BISULFATE 75 MG PO TABS: 75.0000 mg | ORAL_TABLET | Freq: Every day | ORAL | 1 refills | Status: DC

## 2020-02-15 MED ORDER — METFORMIN HCL 1000 MG PO TABS: 1000.0000 mg | ORAL_TABLET | Freq: Two times a day (BID) | ORAL | 1 refills | Status: DC

## 2020-02-15 MED ORDER — ATORVASTATIN CALCIUM 80 MG PO TABS: 80.0000 mg | ORAL_TABLET | Freq: Every day | ORAL | 2 refills | Status: DC

## 2020-02-15 MED FILL — ONE TOUCH VERIO TEST STRIP: 30 days supply | Qty: 50 | Fill #0

## 2020-02-15 NOTE — Progress Notes (Signed)
Assessment & Plan:  Stephan was seen today for hypertension.  Diagnoses and all orders for this visit:  Type 2 diabetes mellitus without complication, without long-term current use of insulin (HCC) -     Cancel: Hepatitis C Antibody -     Glucose (CBG) -     Ambulatory referral to Ophthalmology -     metFORMIN (GLUCOPHAGE) 1000 MG tablet; Take 1 tablet (1,000 mg total) by mouth 2 (two) times daily with a meal Continue blood sugar control as discussed in office today, low carbohydrate diet, and regular physical exercise as tolerated, 150 minutes per week (30 min each day, 5 days per week, or 50 min 3 days per week). Keep blood sugar logs with fasting goal of 90-130 mg/dl, post prandial (after you eat) less than 180.  For Hypoglycemia: BS <60 and Hyperglycemia BS >400; contact the clinic ASAP. Annual eye exams and foot exams are recommended.   Cerebrovascular accident (CVA) due to thrombosis of left posterior cerebral artery (HCC) -     atorvastatin (LIPITOR) 80 MG tablet; Take 1 tablet (80 mg total) by mouth daily. -     clopidogrel (PLAVIX) 75 MG tablet; Take 1 tablet (75 mg total) by mouth daily.  Essential Hypertension Continue all antihypertensives as prescribed.  Remember to bring in your blood pressure log with you for your follow up appointment.  DASH/Mediterranean Diets are healthier choices for HTN.    Patient has been counseled on age-appropriate routine health concerns for screening and prevention. These are reviewed and up-to-date. Referrals have been placed accordingly. Immunizations are up-to-date or declined.    Subjective:   Chief Complaint  Patient presents with  . Hypertension    Pt. is here for blood pressure follow up.    [name] 64 y.o. male presents to office today for follow up to HTN.  has a past medical history of Diabetes mellitus without complication ([place]), History of kidney stones, Hyperlipidemia, Hypertension, Kidney stones, Stomach ulcer,  and Stroke (residual deficits: right fingertip numbness and decreased right hand dexterity).  Seeing Neurology post stroke. EEG pending due to recent "blackout episode" reported by patient.  He denies any re occurring symptoms today.   Essential Hypertension He notes "normal" blood pressures at home. SBP 120-130s. Denies chest pain, shortness of breath, palpitations, lightheadedness, dizziness, headaches or BLE edema. Taking amlodipine 10 mg and losartan 50 mg daily as prescribed.  BP Readings from Last 3 Encounters:  02/15/20 (!) 147/85  01/19/20 (!) 146/80  11/03/19 133/78    DM TYPE 2 A1c has improved however still not at goal. Meter reading in office today: 9 day average 109. Taking glimepiride 8mg  daily, trulicity 4.09 mg weekly and metformin 1000 mg BID. LDL near goal with atorvastatin 80mg  daily. He is monitoring his blood glucose levels daily.  Lab Results  Component Value Date   HGBA1C 7.4 (H) 01/11/2020   Lab Results  Component Value Date   LDLCALC 72 01/11/2020   Review of Systems  Constitutional: Negative for fever, malaise/fatigue and weight loss.  HENT: Negative.  Negative for nosebleeds.   Eyes: Negative.  Negative for blurred vision, double vision and photophobia.  Respiratory: Negative.  Negative for cough and shortness of breath.   Cardiovascular: Negative.  Negative for chest pain, palpitations and leg swelling.  Gastrointestinal: Negative.  Negative for heartburn, nausea and vomiting.  Musculoskeletal: Negative.  Negative for myalgias.  Neurological: Negative.  Negative for dizziness, focal weakness, seizures and headaches.  Psychiatric/Behavioral: Negative.  Negative  for suicidal ideas.    Past Medical History:  Diagnosis Date  . Diabetes mellitus without complication ([place])    type II  . History of kidney stones   . Hyperlipidemia   . Hypertension   . Kidney stones   . Stomach ulcer   . Stroke [facility])    Dec. 14 2018    Past Surgical History:    Procedure Laterality Date  . CYSTOSCOPY Left 12/10/2016   Procedure: cystoscopy with left ureteral stone extraction ;  Surgeon: Kathie Rhodes, MD;  Location: [facility];  Service: Urology;  Laterality: Left;  . HERNIA REPAIR    . Stomach ulcer      Family History  Problem Relation Age of Onset  . Diabetes Father   . Stroke Father   . Hypertension Father   . Hypertension Mother   . Diabetes Sister   . Diabetes Son   . Diabetes Daughter     Social History Reviewed with no changes to be made today.   Outpatient Medications Prior to Visit  Medication Sig Dispense Refill  . amLODipine (NORVASC) 10 MG tablet TAKE 1 TABLET BY MOUTH  DAILY 90 tablet 3  . Blood Glucose Monitoring Suppl (ONETOUCH VERIO) w/Device KIT Use to check blood sugar daily. 1 kit 0  . glimepiride (AMARYL) 4 MG tablet Take 2 tablets (8 mg total) by mouth daily with breakfast. 180 tablet 2  . losartan (COZAAR) 50 MG tablet TAKE 1 TABLET BY MOUTH  DAILY 90 tablet 3  . OneTouch Delica Lancets 59F MISC Use as instructed to check blood sugar once daily. 100 each 3  . ONETOUCH VERIO test strip USE AS INSTRUCTED TO CHECK BLOOD SUGAR DAILY. 50 strip 3  . TRULICITY 6.38 GY/6.5LD SOPN Inject 0.75 mg into the skin once a week.    . clopidogrel (PLAVIX) 75 MG tablet TAKE 1 TABLET BY MOUTH  DAILY 90 tablet 0  . atorvastatin (LIPITOR) 80 MG tablet Take 1 tablet (80 mg total) by mouth daily. 90 tablet 2  . metFORMIN (GLUCOPHAGE) 1000 MG tablet Take 1 tablet (1,000 mg total) by mouth 2 (two) times daily with a meal. 180 tablet 1   No facility-administered medications prior to visit.    No Known Allergies     Objective:    BP (!) 147/85 (BP Location: Left Arm, Patient Position: Sitting, Cuff Size: Normal)   Pulse 98   Temp 97.7 F (36.5 C) (Temporal)   Ht $R'5\' 11"'SD$  (1.803 m)   Wt 196 lb (88.9 kg)   SpO2 98%   BMI 27.34 kg/m  Wt Readings from Last 3 Encounters:  02/15/20 196 lb (88.9 kg)  01/19/20 197 lb 3.2 oz (89.4 kg)   11/03/19 196 lb (88.9 kg)    Physical Exam Vitals and nursing note reviewed.  Constitutional:      Appearance: He is well-developed.  HENT:     Head: Normocephalic and atraumatic.  Cardiovascular:     Rate and Rhythm: Normal rate and regular rhythm.     Heart sounds: Normal heart sounds. No murmur heard.  No friction rub. No gallop.   Pulmonary:     Effort: Pulmonary effort is normal. No tachypnea or respiratory distress.     Breath sounds: Normal breath sounds. No decreased breath sounds, wheezing, rhonchi or rales.  Chest:     Chest wall: No tenderness.  Abdominal:     General: Bowel sounds are normal.     Palpations: Abdomen is soft.  Musculoskeletal:  General: Normal range of motion.     Cervical back: Normal range of motion.  Skin:    General: Skin is warm and dry.  Neurological:     Mental Status: He is alert and oriented to person, place, and time.     Coordination: Coordination normal.  Psychiatric:        Behavior: Behavior normal. Behavior is cooperative.        Thought Content: Thought content normal.        Judgment: Judgment normal.          Patient has been counseled extensively about nutrition and exercise as well as the importance of adherence with medications and regular follow-up. The patient was given clear instructions to go to ER or return to medical center if symptoms don't improve, worsen or new problems develop. The patient verbalized understanding.   Follow-up: Return in about 3 months (around 05/17/2020).   Gildardo Pounds, FNP-BC [facility] [place], [place]   02/15/2020, 8:53 PM

## 2020-02-18 ENCOUNTER — Encounter: Payer: Self-pay | Admitting: Nurse Practitioner

## 2020-02-22 ENCOUNTER — Other Ambulatory Visit: Payer: Self-pay

## 2020-02-22 ENCOUNTER — Ambulatory Visit: Payer: Medicare Other | Admitting: Adult Health

## 2020-02-22 ENCOUNTER — Ambulatory Visit: Payer: Medicare Other | Admitting: Neurology

## 2020-02-22 DIAGNOSIS — R404 Transient alteration of awareness: Secondary | ICD-10-CM

## 2020-02-22 DIAGNOSIS — R4182 Altered mental status, unspecified: Secondary | ICD-10-CM
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
## 2020-02-24 ENCOUNTER — Telehealth: Payer: Self-pay

## 2020-02-24 NOTE — Telephone Encounter (Signed)
-----   Message from Frann Rider, NP sent at 02/24/2020 12:23 PM EDT ----- Please advise patient that recent EEG did not show any evidence of seizures or abnormality.  Thank you.

## 2020-02-24 NOTE — Telephone Encounter (Signed)
Jon [name] is a 64 y.o. male was contacted and notified of the message below. Pt has no additional questions or concerns.

## 2020-03-14 ENCOUNTER — Encounter: Payer: Self-pay | Admitting: Surgery

## 2020-03-14 ENCOUNTER — Ambulatory Visit: Payer: Medicare Other | Admitting: Surgery

## 2020-03-14 ENCOUNTER — Other Ambulatory Visit: Payer: Self-pay

## 2020-03-14 VITALS — BP 124/79 | HR 89 | Temp 98.2°F | Resp 20 | Ht 71.0 in | Wt 195.0 lb

## 2020-03-14 DIAGNOSIS — I6522 Occlusion and stenosis of left carotid artery: Secondary | ICD-10-CM

## 2020-03-14 NOTE — Progress Notes (Signed)
Vascular and Vein Specialist of [facility]  Patient name: Jon [name] MRN: [id number] DOB: Aug 19, 1955 Sex: male   REQUESTING PROVIDER:    Archie Patten   REASON FOR CONSULT:    Carotid stneosis  HISTORY OF PRESENT ILLNESS:   Jon [name] is a 64 y.o. male, who is referred for possible left carotid stenosis.  Patient reports an episode while driving when he was aware that his foot was on the accelerator but when it came time to slow down his brain cannot process putting his foot on the brake.  This lasted a second or so.  He did not seek medical attention.  He also reports possible vision loss for 2 to 3 seconds which completely resolved.  Stroke work-up revealed chronic left sided infarct secondary to small vessel disease source.  He was found to have a small little outpouching in his left carotid artery for which he is referred for my opinion.  He has not had any episodes since beginning his medical regimen  Patient is medically managed for type 2 diabetes she takes an ARB for hypertension.  She is on a statin for hypercholesterolemia.  She is a non-smoker.  PAST MEDICAL HISTORY    Past Medical History:  Diagnosis Date  . Carotid artery occlusion   . Diabetes mellitus without complication ([place])    type II  . History of kidney stones   . Hyperlipidemia   . Hypertension   . Kidney stones   . Stomach ulcer   . Stroke [facility])    Dec. 14 2018     FAMILY HISTORY   Family History  Problem Relation Age of Onset  . Diabetes Father   . Stroke Father   . Hypertension Father   . Hypertension Mother   . Diabetes Sister   . Diabetes Son   . Diabetes Daughter     SOCIAL HISTORY:   Social History   Socioeconomic History  . Marital status: Divorced    Spouse name: Not on file  . Number of children: 4  . Years of education: 94  . Highest education level: Not on file  Occupational History  . Occupation: Unemployed  Tobacco Use  .  Smoking status: Never Smoker  . Smokeless tobacco: Never Used  Vaping Use  . Vaping Use: Never used  Substance and Sexual Activity  . Alcohol use: Yes    Alcohol/week: 1.0 - 2.0 standard drink    Types: 1 - 2 Glasses of wine per week    Comment: socially  . Drug use: No  . Sexual activity: Not Currently  Other Topics Concern  . Not on file  Social History Narrative   Fun: Office manager   Social Determinants of Health   Financial Resource Strain:   . Difficulty of Paying Living Expenses: Not on file  Food Insecurity:   . Worried About Charity fundraiser in the Last Year: Not on file  . Ran Out of Food in the Last Year: Not on file  Transportation Needs:   . Lack of Transportation (Medical): Not on file  . Lack of Transportation (Non-Medical): Not on file  Physical Activity:   . Days of Exercise per Week: Not on file  . Minutes of Exercise per Session: Not on file  Stress:   . Feeling of Stress : Not on file  Social Connections:   . Frequency of Communication with Friends and Family: Not on file  . Frequency of Social Gatherings with Friends and Family: Not on  file  . Attends Religious Services: Not on file  . Active Member of Clubs or Organizations: Not on file  . Attends Archivist Meetings: Not on file  . Marital Status: Not on file  Intimate Partner Violence:   . Fear of Current or Ex-Partner: Not on file  . Emotionally Abused: Not on file  . Physically Abused: Not on file  . Sexually Abused: Not on file    ALLERGIES:    No Known Allergies  CURRENT MEDICATIONS:    Current Outpatient Medications  Medication Sig Dispense Refill  . amLODipine (NORVASC) 10 MG tablet TAKE 1 TABLET BY MOUTH  DAILY 90 tablet 3  . atorvastatin (LIPITOR) 80 MG tablet Take 1 tablet (80 mg total) by mouth daily. 90 tablet 2  . Blood Glucose Monitoring Suppl (ONETOUCH VERIO) w/Device KIT Use to check blood sugar daily. 1 kit 0  . clopidogrel (PLAVIX) 75 MG tablet Take 1 tablet  (75 mg total) by mouth daily. 90 tablet 1  . glimepiride (AMARYL) 4 MG tablet Take 2 tablets (8 mg total) by mouth daily with breakfast. 180 tablet 2  . losartan (COZAAR) 50 MG tablet TAKE 1 TABLET BY MOUTH  DAILY 90 tablet 3  . metFORMIN (GLUCOPHAGE) 1000 MG tablet Take 1 tablet (1,000 mg total) by mouth 2 (two) times daily with a meal. 180 tablet 1  . OneTouch Delica Lancets 94W MISC Use as instructed to check blood sugar once daily. 100 each 3  . ONETOUCH VERIO test strip USE AS INSTRUCTED TO CHECK BLOOD SUGAR DAILY. 50 strip 3  . TRULICITY 5.46 EV/0.3JK SOPN Inject 0.75 mg into the skin once a week.     No current facility-administered medications for this visit.    REVIEW OF SYSTEMS:   '[X]'$  denotes positive finding, $RemoveBeforeDEI'[ ]'[id number]$  denotes negative finding Cardiac  Comments:  Chest pain or chest pressure:    Shortness of breath upon exertion:    Short of breath when lying flat:    Irregular heart rhythm:        Vascular    Pain in calf, thigh, or hip brought on by ambulation:    Pain in feet at night that wakes you up from your sleep:     Blood clot in your veins:    Leg swelling:         Pulmonary    Oxygen at home:    Productive cough:     Wheezing:         Neurologic    Sudden weakness in arms or legs:     Sudden numbness in arms or legs:     Sudden onset of difficulty speaking or slurred speech:    Temporary loss of vision in one eye:     Problems with dizziness:         Gastrointestinal    Blood in stool:      Vomited blood:         Genitourinary    Burning when urinating:     Blood in urine:        Psychiatric    Major depression:         Hematologic    Bleeding problems:    Problems with blood clotting too easily:        Skin    Rashes or ulcers:        Constitutional    Fever or chills:     PHYSICAL EXAM:   Vitals:   03/14/20 1003 03/14/20  1005  BP: 133/79 124/79  Pulse: 89   Resp: 20   Temp: 98.2 F (36.8 C)   SpO2: 95%   Weight: 195 lb (88.5 kg)    Height: $Remove'5\' 11"'aTrkNJA$  (1.803 m)     GENERAL: The patient is a well-nourished male, in no acute distress. The vital signs are documented above. CARDIAC: There is a regular rate and rhythm.  VASCULAR: Palpable radial pulses bilaterally.  Had difficulty feeling pedal pulses PULMONARY: Nonlabored respirations MUSCULOSKELETAL: There are no major deformities or cyanosis. NEUROLOGIC: No focal weakness or paresthesias are detected. SKIN: There are no ulcers or rashes noted. PSYCHIATRIC: The patient has a normal affect.  STUDIES:   I have reviewed the following neck CTA: No large vessel occlusion. No hemodynamically significant stenosis in the neck. No significant intracranial stenosis. Left carotid system: Patent. There is calcified and noncalcified plaque along the proximal ICA causing mild less than 50% stenosis. Stable small focal outpouching near the ICA origin, which could reflect an area of ulcerated plaque. ASSESSMENT and PLAN   Left brain stroke: I think this is most likely from intracranial disease.  There is a small focal outpouching at the origin of the left ICA.  There is no calcium or debris associated with this.  I would not recommend surgical intervention for this lesion, as I do not think this is the source of his stroke.  However, I will have him follow-up in 6 months to make sure that there are not any changes on his scan.  In the meantime he will continue with his current medical regimen.   Leia Alf, MD, FACS [facility] Specialists of [facility] [phone] Pager [phone]

## 2020-03-30 MED FILL — ONE TOUCH VERIO TEST STRIP: 30 days supply | Qty: 50 | Fill #1

## 2020-05-02 DIAGNOSIS — H5213 Myopia, bilateral: Secondary | ICD-10-CM | POA: Diagnosis not present

## 2020-05-02 DIAGNOSIS — H25813 Combined forms of age-related cataract, bilateral: Secondary | ICD-10-CM | POA: Diagnosis not present

## 2020-05-02 DIAGNOSIS — E119 Type 2 diabetes mellitus without complications: Secondary | ICD-10-CM | POA: Diagnosis not present

## 2020-05-02 LAB — HM DIABETES EYE EXAM

## 2020-05-02 MED FILL — ONE TOUCH VERIO TEST STRIP: 30 days supply | Qty: 50 | Fill #2

## 2020-05-02 MED FILL — LOSARTAN POTASSIUM 50 MG TA: 50 | 90 days supply | Qty: 90 | Fill #0

## 2020-05-09 ENCOUNTER — Ambulatory Visit: Payer: Medicare Other | Admitting: Adult Health

## 2020-05-11 MED FILL — LOSARTAN POTASSIUM 50 MG TA: 50 | 90 days supply | Qty: 90 | Fill #0

## 2020-05-11 MED FILL — ONE TOUCH VERIO TEST STRIP: 30 days supply | Qty: 50 | Fill #2

## 2020-05-23 ENCOUNTER — Ambulatory Visit: Payer: Medicare Other | Admitting: Adult Health

## 2020-05-23 ENCOUNTER — Ambulatory Visit: Payer: Medicare Other | Attending: Nurse Practitioner | Admitting: Nurse Practitioner

## 2020-05-23 ENCOUNTER — Other Ambulatory Visit: Payer: Self-pay

## 2020-05-23 ENCOUNTER — Other Ambulatory Visit: Payer: Self-pay | Admitting: Ophthalmology

## 2020-05-23 ENCOUNTER — Encounter: Payer: Self-pay | Admitting: Adult Health

## 2020-05-23 ENCOUNTER — Encounter: Payer: Self-pay | Admitting: Nurse Practitioner

## 2020-05-23 VITALS — BP 124/66 | HR 82 | Ht 70.0 in | Wt 196.2 lb

## 2020-05-23 DIAGNOSIS — E785 Hyperlipidemia, unspecified: Secondary | ICD-10-CM

## 2020-05-23 DIAGNOSIS — H25013 Cortical age-related cataract, bilateral: Secondary | ICD-10-CM | POA: Diagnosis not present

## 2020-05-23 DIAGNOSIS — R404 Transient alteration of awareness: Secondary | ICD-10-CM | POA: Diagnosis not present

## 2020-05-23 DIAGNOSIS — E1165 Type 2 diabetes mellitus with hyperglycemia: Secondary | ICD-10-CM

## 2020-05-23 DIAGNOSIS — I639 Cerebral infarction, unspecified: Secondary | ICD-10-CM | POA: Diagnosis not present

## 2020-05-23 DIAGNOSIS — E119 Type 2 diabetes mellitus without complications: Secondary | ICD-10-CM | POA: Diagnosis not present

## 2020-05-23 DIAGNOSIS — I63332 Cerebral infarction due to thrombosis of left posterior cerebral artery: Secondary | ICD-10-CM

## 2020-05-23 DIAGNOSIS — Z794 Long term (current) use of insulin: Secondary | ICD-10-CM

## 2020-05-23 DIAGNOSIS — H2513 Age-related nuclear cataract, bilateral: Secondary | ICD-10-CM | POA: Diagnosis not present

## 2020-05-23 DIAGNOSIS — I10 Essential (primary) hypertension: Secondary | ICD-10-CM

## 2020-05-23 DIAGNOSIS — I6381 Other cerebral infarction due to occlusion or stenosis of small artery: Secondary | ICD-10-CM

## 2020-05-23 MED ORDER — LOSARTAN POTASSIUM 50 MG PO TABS: 50.0000 mg | ORAL_TABLET | Freq: Every day | ORAL | 3 refills | Status: DC

## 2020-05-23 MED ORDER — ATORVASTATIN CALCIUM 80 MG PO TABS: 80.0000 mg | ORAL_TABLET | Freq: Every day | ORAL | 2 refills | Status: DC

## 2020-05-23 NOTE — Progress Notes (Signed)
Virtual Visit via Telephone Note Due to national recommendations of social distancing due to [place] 19, telehealth visit is felt to be most appropriate for this patient at this time.  I discussed the limitations, risks, security and privacy concerns of performing an evaluation and management service by telephone and the availability of in person appointments. I also discussed with the patient that there may be a patient responsible charge related to this service. The patient expressed understanding and agreed to proceed.    I connected with Jon [name] on 05/23/20  at   4:10 PM EST  EDT by telephone and verified that I am speaking with the correct person using two identifiers.   Consent I discussed the limitations, risks, security and privacy concerns of performing an evaluation and management service by telephone and the availability of in person appointments. I also discussed with the patient that there may be a patient responsible charge related to this service. The patient expressed understanding and agreed to proceed.   Location of Patient: Private Residence   Location of Provider: [place] and [employer] Office    Persons participating in Telemedicine visit: Jon Rankins FNP-BC [place]    History of Present Illness: Telemedicine visit for: Follow Up PMH: DM2, HTN, Thalamic Stroke, HPL He is seeing Neurology and Cardiology Currently taking plavix and high intensity statin post stroke   Essential Hypertension Well controlled. He endorses medication adherence taking amlodipine 10 mg daily and losartan 50 mg daily. Denies chest pain, shortness of breath, palpitations, headaches or BLE edema. LDL at goal with atorvastatin 80 mg daily. He does endorse problems with balance and equilibrium when rising to a standing position. Only lwith  He has seen cardiology for this. I have instructed him to rise slowly when changing positions.  BP Readings from Last  3 Encounters:  05/23/20 124/66  03/14/20 124/79  02/15/20 (!) 147/85   Lab Results  Component Value Date   LDLCALC 72 01/11/2020   DM2 Well controlled. 90 day average readings 90-103. He checks his blood glucose once a day. Taking metformin 1000 mg BID. 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  Lab Results  Component Value Date   HGBA1C 7.4 (H) 01/11/2020   Past Medical History:  Diagnosis Date  . Carotid artery occlusion   . Diabetes mellitus without complication ([place])    type II  . History of kidney stones   . Hyperlipidemia   . Hypertension   . Kidney stones   . Stomach ulcer   . Stroke [facility])    Dec. 14 2018    Past Surgical History:  Procedure Laterality Date  . CYSTOSCOPY Left 12/10/2016   Procedure: cystoscopy with left ureteral stone extraction ;  Surgeon: Kathie Rhodes, MD;  Location: [facility] ORS;  Service: Urology;  Laterality: Left;  . HERNIA REPAIR    . Stomach ulcer      Family History  Problem Relation Age of Onset  . Diabetes Father   . Stroke Father   . Hypertension Father   . Hypertension Mother   . Diabetes Sister   . Diabetes Son   . Diabetes Daughter     Social History   Socioeconomic History  . Marital status: Divorced    Spouse name: Not on file  . Number of children: 4  . Years of education: 27  . Highest education level: Not on file  Occupational History  . Occupation: Unemployed  Tobacco Use  . Smoking  status: Never Smoker  . Smokeless tobacco: Never Used  Vaping Use  . Vaping Use: Never used  Substance and Sexual Activity  . Alcohol use: Yes    Alcohol/week: 1.0 - 2.0 standard drink    Types: 1 - 2 Glasses of wine per week    Comment: socially  . Drug use: No  . Sexual activity: Not Currently  Other Topics Concern  . Not on file  Social History Narrative   Fun: Office manager   Social Determinants of Health   Financial Resource Strain: Not on file  Food Insecurity: Not on file  Transportation Needs: Not on  file  Physical Activity: Not on file  Stress: Not on file  Social Connections: Not on file     Observations/Objective: Awake, alert and oriented x 3   Review of Systems  Constitutional: Negative for fever, malaise/fatigue and weight loss.  HENT: Negative.  Negative for nosebleeds.   Eyes: Negative.  Negative for blurred vision, double vision and photophobia.  Respiratory: Negative.  Negative for cough and shortness of breath.   Cardiovascular: Negative.  Negative for chest pain, palpitations and leg swelling.  Gastrointestinal: Negative.  Negative for heartburn, nausea and vomiting.  Musculoskeletal: Negative.  Negative for myalgias.  Neurological: Positive for dizziness. Negative for focal weakness, seizures and headaches.  Psychiatric/Behavioral: Negative.  Negative for suicidal ideas.    Assessment and Plan: Jon [name] was seen today for follow-up.  Diagnoses and all orders for this visit:  Primary hypertension -     losartan (COZAAR) 50 MG tablet; Take 1 tablet (50 mg total) by mouth daily. Continue all antihypertensives as prescribed.  Remember to bring in your blood pressure log with you for your follow up appointment.  DASH/Mediterranean Diets are healthier choices for HTN.    Type 2 diabetes mellitus without complication, without long-term current use of insulin (HCC) Continue blood sugar control as discussed in office today, low carbohydrate diet, and regular physical exercise as tolerated, 150 minutes per week (30 min each day, 5 days per week, or 50 min 3 days per week). Keep blood sugar logs with fasting goal of 90-130 mg/dl, post prandial (after you eat) less than 180.  For Hypoglycemia: BS <60 and Hyperglycemia BS >400; contact the clinic ASAP. Annual eye exams and foot exams are recommended.   Cerebrovascular accident (CVA) due to thrombosis of left posterior cerebral artery (HCC) -     atorvastatin (LIPITOR) 80 MG tablet; Take 1 tablet (80 mg total) by mouth  daily. INSTRUCTIONS: Work on a low fat, heart healthy diet and participate in regular aerobic exercise program by working out at least 150 minutes per week; 5 days a week-30 minutes per day. Avoid red meat/beef/steak,  fried foods. junk foods, sodas, sugary drinks, unhealthy snacking, alcohol and smoking.  Drink at least 80 oz of water per day and monitor your carbohydrate intake daily.       Follow Up Instructions Return in about 3 months (around 08/21/2020).     I discussed the assessment and treatment plan with the patient. The patient was provided an opportunity to ask questions and all were answered. The patient agreed with the plan and demonstrated an understanding of the instructions.   The patient was advised to call back or seek an in-person evaluation if the symptoms worsen or if the condition fails to improve as anticipated.  I provided 16 minutes of non-face-to-face time during this encounter including median intraservice time, reviewing previous notes, labs, imaging, medications and explaining diagnosis  and management.  Gildardo Pounds, FNP-BC

## 2020-05-23 NOTE — Progress Notes (Signed)
I agree with the above plan 

## 2020-05-23 NOTE — Progress Notes (Signed)
[facility] [address] [place]. [place] [address] [phone]       STROKE FOLLOW UP NOTE  [name] Date of Birth:  [birth year] Medical Record Number:  [id number]   Reason for Referral: stroke follow up    SUBJECTIVE:   CHIEF COMPLAINT:  Chief Complaint  Patient presents with  . Follow-up    Treatment room alone Pt is well, no strokes    HPI:   Today, 05/24/2019, Jon [name] returns for stroke follow up accompanied. Reports residual right hand fingertip numbness and occasional imbalance which has been stable without worsening.  Denies new stroke/TIA symptoms.  Prior complaint of altered awareness episode with EEG negative and no further episodes since that time. Remains on plavix and atorvastatin $RemoveBeforeDE'80mg'zwFemHRbNfVpYnA$  daily without side effects. Blood pressure today 124/66. Monitors at home and typically stable.  No concerns at this time.    History provided for reference purposes only Update 01/19/2020 JM: Jon [name] returns today for stroke follow-up Reports residual deficits of right hand fingertip numbness and decreased right hand dexterity He does report episode approximately 2 weeks ago while he was driving home from work.  He reports he was coming into an intersection where he knew he had to stop but had difficulty moving his foot off the accelerator onto the brake.  He reports loss of consciousness but also reports being aware of what was going on around him.  Denies actual passing out.  After further questioning, reports possible binocular visual loss (blacked out vision).  The symptoms lasted for approximately 2 to 3 seconds and then resolved returning completely back to baseline and able to drive himself home.  Denies weakness, numbness/tingling, confusion (prior, during or after), speech difficulty, headache, dizziness or lightheadedness, diplopia, blurred vision, nausea, diaphoresis, palpitations or shortness of breath.  Denies increased stress or fatigue at  that time.  Reports checking glucose levels once he returned home which was satisfactory.  He did not check blood pressure.  He denies experiencing episode previously or since that time.  He did not seek emergency evaluation at that time.  Remains on clopidogrel and atorvastatin 80 mg daily without side effects Lipid panel 01/11/2020 showed LDL 72 Blood pressure today 146/80.  Monitors at home and typically stable. Glucose levels have been stable per patient with average 106.  Recent A1c 7.4 (01/11/2020), down from prior 7.8.  Managed by PCP. No further concerns at this time  Update 10/21/2019 JM: Jon [name] is being seen for stroke follow-up.  He has been doing well since discharge with improvement of deficits with only mild right finger tip numbness, slightly decreased dexterity and mild right facial droop.  Denies new or worsening stroke/TIA symptoms. He has returned back to all prior activities without difficulty including working. Completed 3 weeks DAPT and continues on Plavix alone without bleeding or bruising.  Continues on atorvastatin 80 mg daily without myalgias.  Blood pressure today 156/87.  Glucose levels stable. Continues to follow with PCP for DM, HTN and HLD management.  No concerns at this time.  Stroke admission 09/17/2019 Jon [name] is a 65 y.o. male with history of stroke involving the right side with no residual deficits in 2018, diabetes, hypertension, hyperlipidemia  who presented on 09/17/2019 with Right-sided weakness and numbness.  Stroke work-up revealed left chronic infarct secondary to small vessel disease [name].  Previously on clopidogrel and recommended DAPT for 3 weeks and Plavix alone.  Prior stroke history in 04/2017 lacunar infarct posterior limb left internal  capsule/lateral thalamus secondary to small vessel disease without residual deficits.  History of HTN stable.  History of HLD previously on atorvastatin 40 mg daily with LDL 99 and increase atorvastatin dosage to  80 mg daily.  Uncontrolled DM with A1c 7.8 recommend close PCP follow-up.  Stroke risk factors include advanced age, EtOH use, and family history of stroke.  Other active problems include normocytic normochromic anemia.  Residual deficits of mild decreased right hand dexterity with therapy recommendation of outpatient OT and discharged home in stable condition.  Stroke:   L thalamic infarct secondary to small vessel disease [name]  CT head No acute abnormality. Old L caudate, L thalamic, L lentiform nucleus, infarct. Small vessel disease.   MRI  Small L thalamic infarct. Small vessel disease. Atrophy. Old same location infarcts.  CTA head & neck no LVO. No sign stenosis   2D Echo EF 60-65%. No [name] of embolus   LDL 99  HgbA1c 7.8  Lovenox 40 mg sq daily for VTE prophylaxis  clopidogrel 75 mg daily prior to admission, now on aspirin 81 mg daily and clopidogrel 75 mg daily. Continue DAPT x 3 weeks then plavix alone.    Therapy recommendations:  outpt OT  Disposition:  Return home      ROS:   14 system review of systems performed and negative with exception of see HPI  PMH:  Past Medical History:  Diagnosis Date  . Carotid artery occlusion   . Diabetes mellitus without complication (HCC)    type II  . History of kidney stones   . Hyperlipidemia   . Hypertension   . Kidney stones   . Stomach ulcer   . Stroke [facility])    Dec. 14 2018    PSH:  Past Surgical History:  Procedure Laterality Date  . CYSTOSCOPY Left 12/10/2016   Procedure: cystoscopy with left ureteral stone extraction ;  Surgeon: Ihor Gully, MD;  Location: [facility];  Service: Urology;  Laterality: Left;  . HERNIA REPAIR    . Stomach ulcer      Social History:  Social History   Socioeconomic History  . Marital status: Divorced    Spouse name: Not on file  . Number of children: 4  . Years of education: 25  . Highest education level: Not on file  Occupational History  . Occupation: Unemployed   Tobacco Use  . Smoking status: Never Smoker  . Smokeless tobacco: Never Used  Vaping Use  . Vaping Use: Never used  Substance and Sexual Activity  . Alcohol use: Yes    Alcohol/week: 1.0 - 2.0 standard drink    Types: 1 - 2 Glasses of wine per week    Comment: socially  . Drug use: No  . Sexual activity: Not Currently  Other Topics Concern  . Not on file  Social History Narrative   Fun: Secretary/administrator   Social Determinants of Health   Financial Resource Strain: Not on file  Food Insecurity: Not on file  Transportation Needs: Not on file  Physical Activity: Not on file  Stress: Not on file  Social Connections: Not on file  Intimate Partner Violence: Not on file    Family History:  Family History  Problem Relation Age of Onset  . Diabetes Father   . Stroke Father   . Hypertension Father   . Hypertension Mother   . Diabetes Sister   . Diabetes Son   . Diabetes Daughter     Medications:   Current Outpatient Medications on  File Prior to Visit  Medication Sig Dispense Refill  . amLODipine (NORVASC) 10 MG tablet TAKE 1 TABLET BY MOUTH  DAILY 90 tablet 3  . Blood Glucose Monitoring Suppl (ONETOUCH VERIO) w/Device KIT Use to check blood sugar daily. 1 kit 0  . clopidogrel (PLAVIX) 75 MG tablet Take 1 tablet (75 mg total) by mouth daily. 90 tablet 1  . losartan (COZAAR) 50 MG tablet TAKE 1 TABLET BY MOUTH  DAILY 90 tablet 3  . metFORMIN (GLUCOPHAGE) 1000 MG tablet Take 1 tablet (1,000 mg total) by mouth 2 (two) times daily with a meal. 180 tablet 1  . OneTouch Delica Lancets 39Q MISC Use as instructed to check blood sugar once daily. 100 each 3  . ONETOUCH VERIO test strip USE AS INSTRUCTED TO CHECK BLOOD SUGAR DAILY. 50 strip 3  . TRULICITY 3.00 PQ/3.3AQ SOPN Inject 0.75 mg into the skin once a week.    Marland Kitchen atorvastatin (LIPITOR) 80 MG tablet Take 1 tablet (80 mg total) by mouth daily. 90 tablet 2  . glimepiride (AMARYL) 4 MG tablet Take 2 tablets (8 mg total) by mouth daily with  breakfast. 180 tablet 2   No current facility-administered medications on file prior to visit.    Allergies:  No Known Allergies    OBJECTIVE:  Physical Exam  Vitals:   05/23/20 1444  BP: 124/66  Pulse: 82  Weight: 196 lb 3.2 oz (89 kg)  Height: $Remove'5\' 10"'RGuVjoV$  (1.778 m)   Body mass index is 28.15 kg/m. No exam data present  General: well developed, well nourished, pleasant middle-aged Jon [name], seated, in no evident distress Head: head normocephalic and atraumatic.   Neck: supple with no carotid or supraclavicular bruits Cardiovascular: regular rate and rhythm, no murmurs Musculoskeletal: no deformity Skin:  no rash/petichiae Vascular:  Normal pulses all extremities   Neurologic Exam Mental Status: Awake and fully alert. Fluent speech and language. Oriented to place and time. Recent and remote memory intact. Attention span, concentration and fund of knowledge appropriate. Mood and affect appropriate.  Cranial Nerves: Pupils equal, briskly reactive to light. Extraocular movements full without nystagmus. Visual fields full to confrontation. Hearing intact. Facial sensation intact.  Motor: Normal bulk and tone. Normal strength in all tested extremity muscles. Sensory.: intact to touch , pinprick , position and vibratory sensation.  Coordination: Rapid alternating movements normal in all extremities except slightly diminished right hand dexterity. Finger-to-nose and heel-to-shin performed accurately bilaterally. Gait and Station: Arises from chair without difficulty. Stance is normal. Gait demonstrates normal stride length and balance without use of assistive device Reflexes: 1+ and symmetric. Toes downgoing.         ASSESSMENT/PLAN: Jon [name] is a 65 y.o. year old male presented with right-sided numbness and weakness on 09/17/2019 with stroke work-up revealing left thalamic infarct secondary to small vessel disease [name]. Vascular risk factors include prior stroke 04/2017, HTN, HLD  and DM.       1. L thalamic stroke a. Residual right hand fingertip numbness, decreased dexterity and occasional imbalance -overall stable b.  Continue clopidogrel 75 mg daily  and atorvastatin 80 mg daily for secondary stroke prevention.   c. Discussed secondary stroke prevention measures and importance of close PCP follow-up and compliance with all prescribed medications for aggressive stroke risk factor management 2. Transient altered awareness episode a. 1 episode in 12/2019 while driving lasting 2-3 seconds consisting of delayed moving of foot from accelerator to brake, and possible binocular visual loss.  No other associated symptoms b.  Unknown etiology without recurrence i. EEG negative c. Continue to monitor and advised to call 911 immediately with any recurrence for further evaluation 3. HTN:  a. BP goal<130/90.  Well-controlled on losartan 50 mg daily and amlodipine 10 mg daily per PCP 4. HLD:  a. LDL goal<70.  Prior LDL 72. On atorvastatin 80 mg daily per PCP   5. DMII:  a. A1c goal<7.0.  Prior A1c 7.4.  On Metformin 1000 mg twice daily and glimepiride 8 mg daily per PCP.       Follow up in 6 months or call earlier if needed   CC:  [facility] provider: Dr. Augustina Mood, Vernia Buff, NP    I spent 30 minutes of face-to-face and non-face-to-face time with patient.  This included previsit chart review, lab review, study review, order entry, electronic health record documentation, patient education regarding history of prior stroke with residual deficits, prior concern of transient altered awareness, importance of managing stroke risk factors and answered all other questions to patient satisfaction   Frann Rider, [facility]  [facility] [address] [place], [place] [address]  Phone [phone] Fax [phone] Note: This document was prepared with digital dictation and possible smart phrase technology. Any transcriptional errors that result  from this process are unintentional.

## 2020-05-23 NOTE — Patient Instructions (Signed)
Continue clopidogrel 75 mg daily  and atorvastatin 80mg  daily  for secondary stroke prevention  Continue to monitor dizziness/vertigo type symptoms - this can be residual from your stroke but could also be blood pressure related at times. We will continue to monitor at this time but may need further evaluation by your PCP or cardiology if blood pressure/cardiac concern  Continue to follow up with PCP regarding cholesterol and blood pressure management  Maintain strict control of hypertension with blood pressure goal below 130/90 and cholesterol with LDL cholesterol (bad cholesterol) goal below 70 mg/dL.      Followup in the future with me in 6 months or call earlier if needed      Thank you for coming to see [place] at [facility]. I hope we have been able to provide you high quality care today.  You may receive a patient satisfaction survey over the next few weeks. We would appreciate your feedback and comments so that we may continue to improve ourselves and the health of our patients.

## 2020-05-30 ENCOUNTER — Other Ambulatory Visit: Payer: Medicare Other

## 2020-05-30 ENCOUNTER — Telehealth: Payer: Self-pay | Admitting: Nurse Practitioner

## 2020-05-30 NOTE — Telephone Encounter (Signed)
Called patient and LVM alerting him that the office is closed due to inclement weather today and his lab appointment will need to be rescheduled. Advised patient to call back to reschedule [phone].

## 2020-06-13 ENCOUNTER — Other Ambulatory Visit: Payer: Self-pay

## 2020-06-13 ENCOUNTER — Other Ambulatory Visit: Payer: Self-pay | Admitting: Nurse Practitioner

## 2020-06-13 ENCOUNTER — Ambulatory Visit: Payer: Medicare Other | Attending: Nurse Practitioner

## 2020-06-13 DIAGNOSIS — Z1159 Encounter for screening for other viral diseases: Secondary | ICD-10-CM

## 2020-06-13 DIAGNOSIS — E785 Hyperlipidemia, unspecified: Secondary | ICD-10-CM

## 2020-06-13 DIAGNOSIS — E1159 Type 2 diabetes mellitus with other circulatory complications: Secondary | ICD-10-CM | POA: Diagnosis not present

## 2020-06-13 DIAGNOSIS — D649 Anemia, unspecified: Secondary | ICD-10-CM | POA: Diagnosis not present

## 2020-06-14 ENCOUNTER — Other Ambulatory Visit: Payer: Self-pay | Admitting: Nurse Practitioner

## 2020-06-14 DIAGNOSIS — D649 Anemia, unspecified: Secondary | ICD-10-CM

## 2020-06-14 LAB — HEMOGLOBIN A1C
Est. average glucose Bld gHb Est-mCnc: 160 mg/dL
Hgb A1c MFr Bld: 7.2 % — ABNORMAL HIGH (ref 4.8–5.6)

## 2020-06-14 LAB — HCV INTERPRETATION

## 2020-06-14 LAB — CMP14+EGFR
ALT: 17 IU/L (ref 0–44)
AST: 19 IU/L (ref 0–40)
Albumin/Globulin Ratio: 1.5 (ref 1.2–2.2)
Albumin: 4.6 g/dL (ref 3.8–4.8)
Alkaline Phosphatase: 82 IU/L (ref 44–121)
BUN/Creatinine Ratio: 21 (ref 10–24)
BUN: 19 mg/dL (ref 8–27)
Bilirubin Total: 0.6 mg/dL (ref 0.0–1.2)
CO2: 25 mmol/L (ref 20–29)
Calcium: 9.4 mg/dL (ref 8.6–10.2)
Chloride: 102 mmol/L (ref 96–106)
Creatinine, Ser: 0.92 mg/dL (ref 0.76–1.27)
GFR calc Af Amer: 100 mL/min/{1.73_m2} (ref >59)
GFR calc non Af Amer: 86 mL/min/{1.73_m2} (ref >59)
Globulin, Total: 3.1 g/dL (ref 1.5–4.5)
Glucose: 176 mg/dL — ABNORMAL HIGH (ref 65–99)
Potassium: 4.2 mmol/L (ref 3.5–5.2)
Sodium: 142 mmol/L (ref 134–144)
Total Protein: 7.7 g/dL (ref 6.0–8.5)

## 2020-06-14 LAB — CBC
Hematocrit: 40.7 % (ref 37.5–51.0)
Hemoglobin: 12.8 g/dL — ABNORMAL LOW (ref 13.0–17.7)
MCH: 24.4 pg — ABNORMAL LOW (ref 26.6–33.0)
MCHC: 31.4 g/dL — ABNORMAL LOW (ref 31.5–35.7)
MCV: 78 fL — ABNORMAL LOW (ref 79–97)
Platelets: 299 10*3/uL (ref 150–450)
RBC: 5.25 x10E6/uL (ref 4.14–5.80)
RDW: 16.3 % — ABNORMAL HIGH (ref 11.6–15.4)
WBC: 5.6 10*3/uL (ref 3.4–10.8)

## 2020-06-14 LAB — LIPID PANEL
Chol/HDL Ratio: 4 ratio (ref 0.0–5.0)
Cholesterol, Total: 137 mg/dL (ref 100–199)
HDL: 34 mg/dL — ABNORMAL LOW (ref >39)
LDL Chol Calc (NIH): 79 mg/dL (ref 0–99)
Triglycerides: 134 mg/dL (ref 0–149)
VLDL Cholesterol Cal: 24 mg/dL (ref 5–40)

## 2020-06-14 LAB — HCV AB W REFLEX TO QUANT PCR: HCV Ab: <0.1 s/co ratio (ref 0.0–0.9)

## 2020-06-27 MED FILL — ONE TOUCH VERIO TEST STRIP: 30 days supply | Qty: 50 | Fill #3

## 2020-07-06 MED FILL — MOXIFLOXACIN HCL 0.5 % SOLN: 0.5 | 3 days supply | Qty: 3 | Fill #0

## 2020-07-09 ENCOUNTER — Other Ambulatory Visit: Payer: Self-pay | Admitting: Nurse Practitioner

## 2020-07-10 NOTE — Telephone Encounter (Signed)
Requested medication (s) are due for refill today: -  Requested medication (s) are on the active medication list: historical med  Last refill:  01/19/20  Future visit scheduled: yes  Notes to clinic:  historical med and provider   Requested Prescriptions  Pending Prescriptions Disp Refills   TRULICITY 6.62 HU/7.6LY SOPN [Pharmacy Med Name: TRULICITY  0.75MG  0.5ML SOLUTION  PEN] 6 mL 3    Sig: INJECT SUBCUTANEOUSLY 0.75  MG ONCE WEEKLY      Endocrinology:  Diabetes - GLP-1 Receptor Agonists Passed - 07/09/2020 10:47 PM      Passed - HBA1C is between 0 and 7.9 and within 180 days    HbA1c, POC (controlled diabetic range)  Date Value Ref Range Status  10/24/2018 8.7 (A) 0.0 - 7.0 % Final   Hgb A1c MFr Bld  Date Value Ref Range Status  06/13/2020 7.2 (H) 4.8 - 5.6 % Final    Comment:             Prediabetes: 5.7 - 6.4          Diabetes: >6.4          Glycemic control for adults with diabetes: <7.0           Passed - Valid encounter within last 6 months    Recent Outpatient Visits           1 month ago Primary hypertension   [place], [place] W, NP   4 months ago Type 2 diabetes mellitus without complication, without long-term current use of insulin ([place])   [place], [place] W, NP   6 months ago Carotid stenosis, left   [employer] [place], [place] W, NP   8 months ago Type 2 diabetes mellitus without complication, without long-term current use of insulin [facility])   [place], [place] W, NP   10 months ago Need for vaccination against Streptococcus pneumoniae using pneumococcal conjugate vaccine [place], RPH-CPP       Future Appointments             In 1 month Gildardo Pounds, NP [place]

## 2020-07-13 ENCOUNTER — Other Ambulatory Visit: Payer: Self-pay | Admitting: Ophthalmology

## 2020-07-13 MED FILL — PREDNISOLONE AC 1% EYE DROP: 1 | 5 days supply | Qty: 5 | Fill #0

## 2020-07-26 DIAGNOSIS — H25812 Combined forms of age-related cataract, left eye: Secondary | ICD-10-CM | POA: Diagnosis not present

## 2020-07-26 DIAGNOSIS — H25012 Cortical age-related cataract, left eye: Secondary | ICD-10-CM | POA: Diagnosis not present

## 2020-07-26 DIAGNOSIS — H2512 Age-related nuclear cataract, left eye: Secondary | ICD-10-CM | POA: Diagnosis not present

## 2020-08-03 ENCOUNTER — Other Ambulatory Visit: Payer: Self-pay | Admitting: Nurse Practitioner

## 2020-08-03 ENCOUNTER — Other Ambulatory Visit: Payer: Self-pay

## 2020-08-03 DIAGNOSIS — I6522 Occlusion and stenosis of left carotid artery: Secondary | ICD-10-CM

## 2020-08-03 DIAGNOSIS — E119 Type 2 diabetes mellitus without complications: Secondary | ICD-10-CM

## 2020-08-03 NOTE — Telephone Encounter (Signed)
Requested Prescriptions  Pending Prescriptions Disp Refills  . metFORMIN (GLUCOPHAGE) 1000 MG tablet [Pharmacy Med Name: metFORMIN HCl 1000 MG Oral Tablet] 180 tablet 0    Sig: TAKE 1 TABLET BY MOUTH  TWICE DAILY WITH MEALS     Endocrinology:  Diabetes - Biguanides Passed - 08/03/2020 11:26 PM      Passed - Cr in normal range and within 360 days    Creatinine, Ser  Date Value Ref Range Status  06/13/2020 0.92 0.76 - 1.27 mg/dL Final   Creatinine,U  Date Value Ref Range Status  06/08/2016 171.6 mg/dL Final         Passed - HBA1C is between 0 and 7.9 and within 180 days    HbA1c, POC (controlled diabetic range)  Date Value Ref Range Status  10/24/2018 8.7 (A) 0.0 - 7.0 % Final   Hgb A1c MFr Bld  Date Value Ref Range Status  06/13/2020 7.2 (H) 4.8 - 5.6 % Final    Comment:             Prediabetes: 5.7 - 6.4          Diabetes: >6.4          Glycemic control for adults with diabetes: <7.0          Passed - AA eGFR in normal range and within 360 days    GFR calc Af Amer  Date Value Ref Range Status  06/13/2020 100 >59 mL/min/1.73 Final    Comment:    **In accordance with recommendations from the NKF-ASN Task force,**   [facility] is in the process of updating its eGFR calculation to the   2021 CKD-EPI creatinine equation that estimates kidney function   without a race variable.    GFR calc non Af Amer  Date Value Ref Range Status  06/13/2020 86 >59 mL/min/1.73 Final   GFR  Date Value Ref Range Status  03/13/2017 95.90 >60.00 mL/min Final         Passed - Valid encounter within last 6 months    Recent Outpatient Visits          2 months ago Primary hypertension   [place], [place] W, NP   5 months ago Type 2 diabetes mellitus without complication, without long-term current use of insulin [facility])   [place], [place] W, NP   7 months ago Carotid stenosis, left   [place], [place] W, NP   9 months ago Type 2 diabetes mellitus without complication, without long-term current use of insulin [facility])   [place], [place] W, NP   11 months ago Need for vaccination against Streptococcus pneumoniae using pneumococcal conjugate vaccine [place], RPH-CPP      Future Appointments            In 3 weeks Gildardo Pounds, NP [place]

## 2020-08-15 ENCOUNTER — Other Ambulatory Visit: Payer: Self-pay

## 2020-08-15 ENCOUNTER — Other Ambulatory Visit: Payer: Self-pay | Admitting: Nurse Practitioner

## 2020-08-15 DIAGNOSIS — E119 Type 2 diabetes mellitus without complications: Secondary | ICD-10-CM

## 2020-08-15 MED ORDER — ONETOUCH VERIO VI STRP
ORAL_STRIP | 3 refills | Status: DC
  Filled 2020-08-15: qty 50, 50d supply, fill #0
  Filled 2020-10-07: qty 50, 50d supply, fill #1
  Filled 2020-11-09: qty 50, 30d supply, fill #2
  Filled 2020-11-21: qty 50, 50d supply, fill #2
  Filled 2021-01-17: qty 50, 50d supply, fill #3

## 2020-08-15 NOTE — Telephone Encounter (Signed)
Requested Prescriptions  Pending Prescriptions Disp Refills  . glucose blood (ONETOUCH VERIO) test strip 50 strip 3    Sig: USE AS INSTRUCTED TO CHECK BLOOD SUGAR DAILY.     Endocrinology: Diabetes - Testing Supplies Passed - 08/15/2020 11:04 AM      Passed - Valid encounter within last 12 months    Recent Outpatient Visits          2 months ago Primary hypertension   [place], [place] W, NP   6 months ago Type 2 diabetes mellitus without complication, without long-term current use of insulin [facility])   [place], [place] W, NP   7 months ago Carotid stenosis, left   [place], [place] W, NP   9 months ago Type 2 diabetes mellitus without complication, without long-term current use of insulin [facility])   [place], [place] W, NP   11 months ago Need for vaccination against Streptococcus pneumoniae using pneumococcal conjugate vaccine [place], RPH-CPP      Future Appointments            In 2 weeks Gildardo Pounds, NP [place]

## 2020-08-16 ENCOUNTER — Other Ambulatory Visit: Payer: Self-pay | Admitting: Nurse Practitioner

## 2020-08-16 DIAGNOSIS — E119 Type 2 diabetes mellitus without complications: Secondary | ICD-10-CM

## 2020-08-16 NOTE — Telephone Encounter (Signed)
Requested medications are due for refill today yes  Requested medications are on the active medication list yes  Last refill 2/9  Last visit 05/23/2020  Future visit scheduled 08/29/2020  Notes to clinic Note from pharmacy states pt is only taking one 4mg  pill a day, this rx is for 4mg  take 2 daily. Please assess.

## 2020-08-17 ENCOUNTER — Other Ambulatory Visit: Payer: Self-pay

## 2020-08-29 ENCOUNTER — Other Ambulatory Visit: Payer: Self-pay

## 2020-08-29 ENCOUNTER — Ambulatory Visit
Admission: RE | Admit: 2020-08-29 | Discharge: 2020-08-29 | Disposition: A | Discharge status: Home / Self Care | Payer: Medicare Other | Source: Ambulatory Visit | Attending: Surgery | Admitting: Surgery

## 2020-08-29 ENCOUNTER — Ambulatory Visit: Payer: Medicare Other | Attending: Nurse Practitioner | Admitting: Nurse Practitioner

## 2020-08-29 ENCOUNTER — Encounter: Payer: Self-pay | Admitting: Nurse Practitioner

## 2020-08-29 VITALS — BP 124/76 | HR 83 | Resp 19 | Ht 70.0 in | Wt 197.0 lb

## 2020-08-29 DIAGNOSIS — I10 Essential (primary) hypertension: Secondary | ICD-10-CM

## 2020-08-29 DIAGNOSIS — D649 Anemia, unspecified: Secondary | ICD-10-CM

## 2020-08-29 DIAGNOSIS — I6523 Occlusion and stenosis of bilateral carotid arteries: Secondary | ICD-10-CM | POA: Diagnosis not present

## 2020-08-29 DIAGNOSIS — Z125 Encounter for screening for malignant neoplasm of prostate: Secondary | ICD-10-CM

## 2020-08-29 DIAGNOSIS — E119 Type 2 diabetes mellitus without complications: Secondary | ICD-10-CM | POA: Diagnosis not present

## 2020-08-29 DIAGNOSIS — I672 Cerebral atherosclerosis: Secondary | ICD-10-CM | POA: Diagnosis not present

## 2020-08-29 DIAGNOSIS — I6522 Occlusion and stenosis of left carotid artery: Secondary | ICD-10-CM

## 2020-08-29 DIAGNOSIS — J320 Chronic maxillary sinusitis: Secondary | ICD-10-CM | POA: Diagnosis not present

## 2020-08-29 DIAGNOSIS — I771 Stricture of artery: Secondary | ICD-10-CM | POA: Diagnosis not present

## 2020-08-29 MED ORDER — IOPAMIDOL (ISOVUE-370) INJECTION 76%
75.0000 mL | Freq: Once | INTRAVENOUS | Status: AC | PRN
  Administered 2020-08-29: 75 mL via INTRAVENOUS

## 2020-08-29 NOTE — Progress Notes (Signed)
Assessment & Plan:  Jon [name] was seen today for 3 month follow up .  Diagnoses and all orders for this visit:  Primary hypertension -     CMP14+EGFR; Future Continue all antihypertensives as prescribed.  Remember to bring in your blood pressure log with you for your follow up appointment.  DASH/Mediterranean Diets are healthier choices for HTN.    Prostate cancer screening -     PSA; Future  Anemia, unspecified type -     CBC; Future  Type 2 diabetes mellitus without complication, without long-term current use of insulin (HCC) -     Hemoglobin A1c; Future Denies chest pain, shortness of breath, palpitations, lightheadedness, dizziness, headaches or BLE edema.     Patient has been counseled on age-appropriate routine health concerns for screening and prevention. These are reviewed and up-to-date. Referrals have been placed accordingly. Immunizations are up-to-date or declined.    Subjective:   Chief Complaint  Patient presents with  . 3 Month follow up    [name] 65 y.o. male presents to office today for 3 months follow up. He has a past medical history of Carotid artery occlusion, DM, History of kidney stones, Hyperlipidemia, Hypertension, Kidney Stones, Stomach ulcer, and Stroke  Essential Hypertension Blood pressure is well controlled. Taking amlodipine 10 mg daily and losartan 50 mg daily. Denies chest pain, shortness of breath, palpitations, lightheadedness, dizziness, headaches or BLE edema.  BP Readings from Last 3 Encounters:  08/29/20 124/76  05/23/20 124/66  03/14/20 124/79    DM 2 Reports 90 day home average 104. It is too early for A1c today. He denies any symptoms of hypo or hyperglycemia. Taking metformin 1000 mg BID, glimepiride 8 mg daily,  and Trulicity 3.01 weekly. LDL not at goal with atorvastatin 80 mg daily.  Lab Results  Component Value Date   HGBA1C 7.2 (H) 06/13/2020   Lab Results  Component Value Date   LDLCALC 79 06/13/2020    Review of Systems  Constitutional: Negative for fever, malaise/fatigue and weight loss.  HENT: Negative.  Negative for nosebleeds.   Eyes: Negative.  Negative for blurred vision, double vision and photophobia.  Respiratory: Negative.  Negative for cough and shortness of breath.   Cardiovascular: Negative.  Negative for chest pain, palpitations and leg swelling.  Gastrointestinal: Negative.  Negative for heartburn, nausea and vomiting.  Musculoskeletal: Negative.  Negative for myalgias.  Neurological: Negative.  Negative for dizziness, focal weakness, seizures and headaches.  Psychiatric/Behavioral: Negative.  Negative for suicidal ideas.    Past Medical History:  Diagnosis Date  . Carotid artery occlusion   . Diabetes mellitus without complication ([place])    type II  . History of kidney stones   . Hyperlipidemia   . Hypertension   . Kidney stones   . Stomach ulcer   . Stroke [facility])    Dec. 14 2018    Past Surgical History:  Procedure Laterality Date  . CYSTOSCOPY Left 12/10/2016   Procedure: cystoscopy with left ureteral stone extraction ;  Surgeon: Kathie Rhodes, MD;  Location: [facility];  Service: Urology;  Laterality: Left;  . HERNIA REPAIR    . Stomach ulcer      Family History  Problem Relation Age of Onset  . Diabetes Father   . Stroke Father   . Hypertension Father   . Hypertension Mother   . Diabetes Sister   . Diabetes Son   . Diabetes Daughter     Social History Reviewed with no changes to  be made today.   Outpatient Medications Prior to Visit  Medication Sig Dispense Refill  . amLODipine (NORVASC) 10 MG tablet TAKE 1 TABLET BY MOUTH  DAILY 90 tablet 3  . Blood Glucose Monitoring Suppl (ONETOUCH VERIO) w/Device KIT Use to check blood sugar daily. 1 kit 0  . clopidogrel (PLAVIX) 75 MG tablet Take 1 tablet (75 mg total) by mouth daily. 90 tablet 1  . glimepiride (AMARYL) 4 MG tablet TAKE 2 TABLETS BY MOUTH  DAILY WITH BREAKFAST 60 tablet 0  . glucose blood  (ONETOUCH VERIO) test strip USE AS INSTRUCTED TO CHECK BLOOD SUGAR DAILY. (Patient taking differently: USE AS INSTRUCTED TO CHECK BLOOD SUGAR DAILY.) 50 strip 3  . losartan (COZAAR) 50 MG tablet Take 1 tablet (50 mg total) by mouth daily. 90 tablet 3  . metFORMIN (GLUCOPHAGE) 1000 MG tablet TAKE 1 TABLET BY MOUTH  TWICE DAILY WITH MEALS 180 tablet 0  . moxifloxacin (VIGAMOX) 0.5 % ophthalmic solution INSTILL 1 DROP INTO THE LEFT EYE 4 TIMES DAILY STARTING 2 DAYS PRIOR TO SURGERY, AND 2 DROPS THE MORNING OF SURGERY. 3 mL 1  . OneTouch Delica Lancets 99H MISC Use as instructed to check blood sugar once daily. 100 each 3  . prednisoLONE acetate (PRED FORTE) 1 % ophthalmic suspension INSTILL 1 DROP INTO THE LEFT EYE 4 TIMES DAILY. START 2 DAYS PRIOR TO SURGERY. 5 mL 1  . TRULICITY 7.41 SE/3.9RV SOPN INJECT SUBCUTANEOUSLY 0.75  MG ONCE WEEKLY 6 mL 0  . atorvastatin (LIPITOR) 80 MG tablet Take 1 tablet (80 mg total) by mouth daily. 90 tablet 2   No facility-administered medications prior to visit.    No Known Allergies     Objective:    BP 124/76   Pulse 83   Resp 19   Ht $R'5\' 10"'ma$  (1.778 m)   Wt 197 lb (89.4 kg)   SpO2 95%   BMI 28.27 kg/m  Wt Readings from Last 3 Encounters:  08/29/20 197 lb (89.4 kg)  05/23/20 196 lb 3.2 oz (89 kg)  03/14/20 195 lb (88.5 kg)    Physical Exam Vitals and nursing note reviewed.  Constitutional:      Appearance: He is well-developed.  HENT:     Head: Normocephalic and atraumatic.  Cardiovascular:     Rate and Rhythm: Normal rate and regular rhythm.     Heart sounds: Normal heart sounds. No murmur heard. No friction rub. No gallop.   Pulmonary:     Effort: Pulmonary effort is normal. No tachypnea or respiratory distress.     Breath sounds: Normal breath sounds. No decreased breath sounds, wheezing, rhonchi or rales.  Chest:     Chest wall: No tenderness.  Abdominal:     General: Bowel sounds are normal.     Palpations: Abdomen is soft.   Musculoskeletal:        General: Normal range of motion.     Cervical back: Normal range of motion.  Skin:    General: Skin is warm and dry.  Neurological:     Mental Status: He is alert and oriented to person, place, and time.     Coordination: Coordination normal.  Psychiatric:        Behavior: Behavior normal. Behavior is cooperative.        Thought Content: Thought content normal.        Judgment: Judgment normal.          Patient has been counseled extensively about nutrition and exercise as  well as the importance of adherence with medications and regular follow-up. The patient was given clear instructions to go to ER or return to medical center if symptoms don't improve, worsen or new problems develop. The patient verbalized understanding.   Follow-up: Return in about 4 months (around 12/16/2020).   Gildardo Pounds, FNP-BC [facility] [place], [place]   08/29/2020, 4:46 PM

## 2020-09-03 ENCOUNTER — Other Ambulatory Visit: Payer: Self-pay | Admitting: Nurse Practitioner

## 2020-09-03 DIAGNOSIS — I63332 Cerebral infarction due to thrombosis of left posterior cerebral artery: Secondary | ICD-10-CM

## 2020-09-04 NOTE — Telephone Encounter (Signed)
Requested Prescriptions  Pending Prescriptions Disp Refills  . clopidogrel (PLAVIX) 75 MG tablet [Pharmacy Med Name: Clopidogrel Bisulfate 75 MG Oral Tablet] 90 tablet 0    Sig: TAKE 1 TABLET BY MOUTH  DAILY     Hematology: Antiplatelets - clopidogrel Failed - 09/03/2020 10:22 PM      Failed - Evaluate AST, ALT within 2 months of therapy initiation.      Failed - HGB in normal range and within 180 days    Hemoglobin  Date Value Ref Range Status  06/13/2020 12.8 (L) 13.0 - 17.7 g/dL Final         Passed - ALT in normal range and within 360 days    ALT  Date Value Ref Range Status  06/13/2020 17 0 - 44 IU/L Final         Passed - AST in normal range and within 360 days    AST  Date Value Ref Range Status  06/13/2020 19 0 - 40 IU/L Final         Passed - HCT in normal range and within 180 days    Hematocrit  Date Value Ref Range Status  06/13/2020 40.7 37.5 - 51.0 % Final         Passed - PLT in normal range and within 180 days    Platelets  Date Value Ref Range Status  06/13/2020 299 150 - 450 x10E3/uL Final         Passed - Valid encounter within last 6 months    Recent Outpatient Visits          6 days ago Primary hypertension   [place], Vernia Buff, NP   3 months ago Primary hypertension   [place], [place] W, NP   6 months ago Type 2 diabetes mellitus without complication, without long-term current use of insulin [facility])   [place], [place] W, NP   8 months ago Carotid stenosis, left   [place], [place] W, NP   10 months ago Type 2 diabetes mellitus without complication, without long-term current use of insulin [facility])   [place], Vernia Buff, NP      Future Appointments            In 3 months Gildardo Pounds, NP [place]

## 2020-09-05 ENCOUNTER — Ambulatory Visit: Payer: Medicare Other | Admitting: Surgery

## 2020-09-05 ENCOUNTER — Encounter: Payer: Self-pay | Admitting: Surgery

## 2020-09-05 ENCOUNTER — Other Ambulatory Visit: Payer: Self-pay

## 2020-09-05 VITALS — BP 132/82 | HR 97 | Temp 98.0°F | Resp 20 | Ht 70.0 in | Wt 193.0 lb

## 2020-09-05 DIAGNOSIS — I6522 Occlusion and stenosis of left carotid artery: Secondary | ICD-10-CM
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
## 2020-09-12 ENCOUNTER — Other Ambulatory Visit: Payer: Self-pay

## 2020-09-12 ENCOUNTER — Ambulatory Visit (INDEPENDENT_AMBULATORY_CARE_PROVIDER_SITE_OTHER): Payer: Medicare Other | Admitting: Gastroenterology

## 2020-09-12 ENCOUNTER — Ambulatory Visit: Payer: Medicare Other | Attending: Nurse Practitioner

## 2020-09-12 ENCOUNTER — Encounter: Payer: Self-pay | Admitting: Gastroenterology

## 2020-09-12 VITALS — BP 140/70 | HR 104 | Ht 70.0 in | Wt 193.0 lb

## 2020-09-12 DIAGNOSIS — E1159 Type 2 diabetes mellitus with other circulatory complications: Secondary | ICD-10-CM | POA: Diagnosis not present

## 2020-09-12 DIAGNOSIS — D509 Iron deficiency anemia, unspecified: Secondary | ICD-10-CM

## 2020-09-12 DIAGNOSIS — Z125 Encounter for screening for malignant neoplasm of prostate: Secondary | ICD-10-CM

## 2020-09-12 DIAGNOSIS — Z7902 Long term (current) use of antithrombotics/antiplatelets: Secondary | ICD-10-CM | POA: Diagnosis not present

## 2020-09-12 DIAGNOSIS — D649 Anemia, unspecified: Secondary | ICD-10-CM

## 2020-09-12 DIAGNOSIS — Z8673 Personal history of transient ischemic attack (TIA), and cerebral infarction without residual deficits: Secondary | ICD-10-CM

## 2020-09-12 DIAGNOSIS — E119 Type 2 diabetes mellitus without complications: Secondary | ICD-10-CM | POA: Diagnosis not present

## 2020-09-12 DIAGNOSIS — I10 Essential (primary) hypertension: Secondary | ICD-10-CM

## 2020-09-12 MED ORDER — PLENVU 140 G PO SOLR
140.0000 g | ORAL | 0 refills | Status: DC
  Filled 2020-09-12 – 2020-09-15 (×2): qty 1, 1d supply, fill #0

## 2020-09-12 NOTE — Progress Notes (Signed)
[facility] Progress Note  Chief Complaint: Microcytic anemia  Subjective  History: [name] was referred to [place] again for chronic anemia. From my May 2021 office note assessment and plan: "Anemia with MCV low normal or sometimes just below normal, mildly decreased iron saturation but with normal iron level and ferritin.  He has no localizing GI symptoms.   I doubt this is anemia is on the basis of chronic GI blood loss, and suspect he probably has thalassemia.   Nevertheless, stool cards were ordered by primary care and apparently done by patient.  If positive, we will have no choice but to proceed with upper endoscopy and colonoscopy.  Those procedures would be increased risk due to his recent CVA.     Plan: This note will be forwarded to primary care with my thoughts and suggestion that they could obtain a hemoglobin electrophoresis to look for thalassemia.  I do not believe it would change his management, but would give him a diagnosis. My request of them is also that they track down the results of his stool cards done through [facility] by his report.  If positive, make me aware and we will then schedule endoscopic procedures." -___________________  Jon [name] continues to feel well, and specifically denies heartburn, dysphagia, odynophagia, nausea or vomiting.  He denies chronic abdominal pain, altered bowel habits, black or bloody stool.  At the time of his initial visit with me in December 2020, he reported having had a normal colonoscopy while living in [place] in 2014.  ROS: Cardiovascular:  no chest pain Respiratory: no dyspnea He sometimes gets tingling in a few fingers.  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 Remainder of systems negative except as above The patient's Past Medical, Family and Social History were reviewed and are on file in the EMR. Last neurology follow-up note from 05/23/2020  reviewed Last primary care note reviewed Objective:  Med list reviewed  Current Outpatient Medications:  .  amLODipine (NORVASC) 10 MG tablet, TAKE 1 TABLET BY MOUTH  DAILY, Disp: 90 tablet, Rfl: 3 .  atorvastatin (LIPITOR) 80 MG tablet, Take 1 tablet (80 mg total) by mouth daily., Disp: 90 tablet, Rfl: 2 .  Blood Glucose Monitoring Suppl (ONETOUCH VERIO) w/Device KIT, Use to check blood sugar daily., Disp: 1 kit, Rfl: 0 .  clopidogrel (PLAVIX) 75 MG tablet, TAKE 1 TABLET BY MOUTH  DAILY, Disp: 90 tablet, Rfl: 0 .  glimepiride (AMARYL) 4 MG tablet, TAKE 2 TABLETS BY MOUTH  DAILY WITH BREAKFAST, Disp: 60 tablet, Rfl: 0 .  glucose blood (ONETOUCH VERIO) test strip, USE AS INSTRUCTED TO CHECK BLOOD SUGAR DAILY. (Patient taking differently: USE AS INSTRUCTED TO CHECK BLOOD SUGAR DAILY.), Disp: 50 strip, Rfl: 3 .  losartan (COZAAR) 50 MG tablet, Take 1 tablet (50 mg total) by mouth daily., Disp: 90 tablet, Rfl: 3 .  metFORMIN (GLUCOPHAGE) 1000 MG tablet, TAKE 1 TABLET BY MOUTH  TWICE DAILY WITH MEALS, Disp: 180 tablet, Rfl: 0 .  moxifloxacin (VIGAMOX) 0.5 % ophthalmic solution, INSTILL 1 DROP INTO THE LEFT EYE 4 TIMES DAILY STARTING 2 DAYS PRIOR TO SURGERY, AND 2 DROPS THE MORNING OF SURGERY., Disp: 3 mL, Rfl: 1 .  OneTouch Delica Lancets 78M MISC, Use as instructed to check blood sugar once daily., Disp: 100 each, Rfl: 3 .  prednisoLONE acetate (PRED FORTE) 1 % ophthalmic suspension, INSTILL 1 DROP INTO THE LEFT EYE 4 TIMES DAILY. START  2 DAYS PRIOR TO SURGERY., Disp: 5 mL, Rfl: 1 .  TRULICITY 1.47 WG/9.5AO SOPN, INJECT SUBCUTANEOUSLY 0.75  MG ONCE WEEKLY, Disp: 6 mL, Rfl: 0   Vital signs in last 24 hrs: Vitals:   09/12/20 1021  BP: 140/70  Pulse: (!) 104   Wt Readings from Last 3 Encounters:  09/12/20 193 lb (87.5 kg)  09/05/20 193 lb (87.5 kg)  08/29/20 197 lb (89.4 kg)    Physical Exam  Well-appearing, no dysarthria  HEENT: sclera anicteric, oral mucosa moist without  lesions  Neck: supple, no thyromegaly, JVD or lymphadenopathy  Cardiac: RRR without murmurs, S1S2 heard, no peripheral edema  Pulm: clear to auscultation bilaterally, normal RR and effort noted  Abdomen: soft, no tenderness, with active bowel sounds. No guarding or palpable hepatosplenomegaly.  Skin; warm and dry, no jaundice or rash  Labs:  CBC Latest Ref Rng & Units 06/13/2020 01/11/2020 09/17/2019  WBC 3.4 - 10.8 x10E3/uL 5.6 5.0 5.9  Hemoglobin 13.0 - 17.7 g/dL 12.8(L) 12.3(L) 12.9(L)  Hematocrit 37.5 - 51.0 % 40.7 39.6 42.8  Platelets 150 - 450 x10E3/uL 299 265 303   In May 2021 he had a normal iron, TIBC and ferritin with a low saturation of 11% ___________________________________________ Radiologic studies:   ____________________________________________ Other:   _____________________________________________ Assessment & Plan  Assessment: Encounter Diagnoses  Name Primary?  . Microcytic anemia Yes  . Long term (current) use of antithrombotics/antiplatelets   . Type 2 diabetes mellitus with vascular disease ([place])   . History of CVA (cerebrovascular accident)    Chronic mild microcytic anemia without iron deficiency. No localizing GI symptoms  As before, I suspect this is not likely from chronic GI blood loss, and I suspect he may have thalassemia trait.  Given that this remains a clinical question and concern from his primary care provider, we will proceed with endoscopic work-up to rule out a source of occult GI blood loss.  I offered Mr. Markell an upper endoscopy and colonoscopy.  He was agreeable after discussion of procedure and risks.  The benefits and risks of the planned procedure were described in detail with the patient or (when appropriate) their health care proxy.  Risks were outlined as including, but not limited to, bleeding, infection, perforation, adverse medication reaction leading to cardiac or pulmonary decompensation, pancreatitis (if ERCP).  The  limitation of incomplete mucosal visualization was also discussed.  No guarantees or warranties were given.  He has to remain off Plavix 5 days before the procedure, but we would like him to take aspirin 81 mg daily during those 5 days.  We will communicate with his neurologist to see if they have any objection to this plan. Jon [name] understands the small but real risk of recurrent stroke during the short period of time off Plavix.  I will copy my note to primary care and again suggest hemoglobin electrophoresis the next time they see him.  32 minutes were spent on this encounter (including chart review, history/exam, counseling/coordination of care, and documentation) > 50% of that time was spent on counseling and coordination of care.  Topics discussed included: See above.  Nelida Meuse III

## 2020-09-12 NOTE — Patient Instructions (Signed)
If you are age 67 or older, your body mass index should be between 23-30. Your Body mass index is 27.69 kg/m. If this is out of the aforementioned range listed, please consider follow up with your Primary Care Provider.  If you are age 41 or younger, your body mass index should be between 19-25. Your Body mass index is 27.69 kg/m. If this is out of the aformentioned range listed, please consider follow up with your Primary Care Provider.   You have been scheduled for a colonoscopy. Please follow written instructions given to you at your visit today.  Please pick up your prep supplies at the pharmacy within the next 1-3 days. If you use inhalers (even only as needed), please bring them with you on the day of your procedure.  It was a pleasure to see you today!  Thank you for trusting me with your gastrointestinal care!

## 2020-09-13 LAB — CMP14+EGFR
ALT: 15 IU/L (ref 0–44)
AST: 14 IU/L (ref 0–40)
Albumin/Globulin Ratio: 1.3 (ref 1.2–2.2)
Albumin: 4.5 g/dL (ref 3.8–4.8)
Alkaline Phosphatase: 79 IU/L (ref 44–121)
BUN/Creatinine Ratio: 16 (ref 10–24)
BUN: 15 mg/dL (ref 8–27)
Bilirubin Total: 0.8 mg/dL (ref 0.0–1.2)
CO2: 21 mmol/L (ref 20–29)
Calcium: 9.9 mg/dL (ref 8.6–10.2)
Chloride: 100 mmol/L (ref 96–106)
Creatinine, Ser: 0.96 mg/dL (ref 0.76–1.27)
Globulin, Total: 3.4 g/dL (ref 1.5–4.5)
Glucose: 89 mg/dL (ref 65–99)
Potassium: 4.5 mmol/L (ref 3.5–5.2)
Sodium: 137 mmol/L (ref 134–144)
Total Protein: 7.9 g/dL (ref 6.0–8.5)
eGFR: 87 mL/min/{1.73_m2} (ref >59)

## 2020-09-13 LAB — CBC
Hematocrit: 40.4 % (ref 37.5–51.0)
Hemoglobin: 12.6 g/dL — ABNORMAL LOW (ref 13.0–17.7)
MCH: 24.8 pg — ABNORMAL LOW (ref 26.6–33.0)
MCHC: 31.2 g/dL — ABNORMAL LOW (ref 31.5–35.7)
MCV: 80 fL (ref 79–97)
Platelets: 257 10*3/uL (ref 150–450)
RBC: 5.08 x10E6/uL (ref 4.14–5.80)
RDW: 16.3 % — ABNORMAL HIGH (ref 11.6–15.4)
WBC: 7 10*3/uL (ref 3.4–10.8)

## 2020-09-13 LAB — HEMOGLOBIN A1C
Est. average glucose Bld gHb Est-mCnc: 157 mg/dL
Hgb A1c MFr Bld: 7.1 % — ABNORMAL HIGH (ref 4.8–5.6)

## 2020-09-13 LAB — PSA: Prostate Specific Ag, Serum: 1.5 ng/mL (ref 0.0–4.0)

## 2020-09-14 ENCOUNTER — Other Ambulatory Visit: Payer: Self-pay

## 2020-09-15 ENCOUNTER — Other Ambulatory Visit: Payer: Self-pay | Admitting: Gastroenterology

## 2020-09-15 ENCOUNTER — Other Ambulatory Visit: Payer: Self-pay

## 2020-09-15 MED ORDER — SUPREP BOWEL PREP KIT 17.5-3.13-1.6 GM/177ML PO SOLN
ORAL | 0 refills | Status: DC
Start: 2020-09-15 — End: 2020-10-24

## 2020-09-20 DIAGNOSIS — H25011 Cortical age-related cataract, right eye: Secondary | ICD-10-CM | POA: Diagnosis not present

## 2020-09-20 DIAGNOSIS — H2511 Age-related nuclear cataract, right eye: Secondary | ICD-10-CM | POA: Diagnosis not present

## 2020-09-20 DIAGNOSIS — H25811 Combined forms of age-related cataract, right eye: Secondary | ICD-10-CM | POA: Diagnosis not present

## 2020-09-21 ENCOUNTER — Other Ambulatory Visit: Payer: Self-pay

## 2020-09-29 ENCOUNTER — Other Ambulatory Visit: Payer: Self-pay | Admitting: Family Medicine

## 2020-09-29 DIAGNOSIS — E119 Type 2 diabetes mellitus without complications: Secondary | ICD-10-CM

## 2020-09-29 NOTE — Telephone Encounter (Signed)
Requested Prescriptions  Pending Prescriptions Disp Refills  . glimepiride (AMARYL) 4 MG tablet [Pharmacy Med Name: Glimepiride 4 MG Oral Tablet] 60 tablet 4    Sig: TAKE 2 TABLETS BY MOUTH  DAILY WITH BREAKFAST     Endocrinology:  Diabetes - Sulfonylureas Passed - 09/29/2020 11:07 PM      Passed - HBA1C is between 0 and 7.9 and within 180 days    HbA1c, POC (controlled diabetic range)  Date Value Ref Range Status  10/24/2018 8.7 (A) 0.0 - 7.0 % Final   Hgb A1c MFr Bld  Date Value Ref Range Status  09/12/2020 7.1 (H) 4.8 - 5.6 % Final    Comment:             Prediabetes: 5.7 - 6.4          Diabetes: >6.4          Glycemic control for adults with diabetes: <7.0          Passed - Valid encounter within last 6 months    Recent Outpatient Visits          1 month ago Primary hypertension   [place], Vernia Buff, NP   4 months ago Primary hypertension   [place], [place] W, NP   7 months ago Type 2 diabetes mellitus without complication, without long-term current use of insulin [facility])   [place], [place] W, NP   8 months ago Carotid stenosis, left   [place], [place] W, NP   11 months ago Type 2 diabetes mellitus without complication, without long-term current use of insulin [facility])   [place], Vernia Buff, NP      Future Appointments            In 2 months Gildardo Pounds, NP [place]

## 2020-10-07 ENCOUNTER — Other Ambulatory Visit: Payer: Self-pay

## 2020-10-11 ENCOUNTER — Other Ambulatory Visit: Payer: Self-pay

## 2020-10-24 ENCOUNTER — Other Ambulatory Visit: Payer: Self-pay

## 2020-10-24 ENCOUNTER — Telehealth: Payer: Self-pay | Admitting: Gastroenterology

## 2020-10-24 ENCOUNTER — Telehealth: Payer: Self-pay

## 2020-10-24 MED ORDER — SUPREP BOWEL PREP KIT 17.5-3.13-1.6 GM/177ML PO SOLN: ORAL | 0 refills | Status: DC

## 2020-10-24 MED ORDER — NA SULFATE-K SULFATE-MG SULF 17.5-3.13-1.6 GM/177ML PO SOLN
ORAL | 0 refills | Status: DC
  Filled 2020-10-24: qty 354, 1d supply, fill #0

## 2020-10-24 NOTE — Telephone Encounter (Signed)
Dr.     Request for surgical clearance:     Endoscopy Procedure  What type of surgery is being performed?     Colonoscopy  When is this surgery scheduled?     11-23-2020  What type of clearance is required ?   Pharmacy  Are there any medications that need to be held prior to surgery and how long? 5 days Plavix   Practice name and name of physician performing surgery?      [facility]  What is your office phone and fax number?      Phone- [phone]  [phone]  Anesthesia type (None, local, MAC, general) ?       MAC

## 2020-10-24 NOTE — Telephone Encounter (Signed)
Patient has been rescheduled. Due to no communication from Neurology. Rescheduled to 11-23-2020 new prep and instructions have been given to the patient

## 2020-10-24 NOTE — Telephone Encounter (Signed)
Patient called said he has not received any prep medication and he also has not stopped his Plavix because he was waiting on a confirmation call.

## 2020-10-26 ENCOUNTER — Telehealth: Payer: Self-pay

## 2020-10-26 NOTE — Telephone Encounter (Signed)
-----   Message from Garvin Fila, MD sent at 10/24/2020  4:24 PM EDT ----- Our records indicate patient had a stroke in May 2021 and was last seen at our office in January 2022.  Patient has not had any further stroke or TIA symptoms he may be neurologically cleared for colonoscopy procedure and stop Plavix 3 to 5 days prior to the procedure with a small but acceptable periprocedural risk of TIA/stroke if patient is willing. ----- Message ----- From: Elias Else, CMA Sent: 10/24/2020   4:03 PM EDT To: Garvin Fila, MD

## 2020-10-26 NOTE — Telephone Encounter (Signed)
See other communication for clearance from Dr Leonie Man.  Patient has been notified and aware

## 2020-10-26 NOTE — Telephone Encounter (Signed)
Patient has been called and notified of instructions to hold the Plavix 5 days prior to procedure

## 2020-10-27 ENCOUNTER — Other Ambulatory Visit: Payer: Self-pay | Admitting: Nurse Practitioner

## 2020-10-27 ENCOUNTER — Telehealth: Payer: Self-pay | Admitting: Emergency Medicine

## 2020-10-27 DIAGNOSIS — E119 Type 2 diabetes mellitus without complications: Secondary | ICD-10-CM

## 2020-10-27 NOTE — Telephone Encounter (Signed)
Faxed Surgical clearance form signed by Dr. Leonie Man.  OK Transmission received.

## 2020-10-28 ENCOUNTER — Encounter: Payer: Medicare Other | Admitting: Gastroenterology

## 2020-11-07 ENCOUNTER — Emergency Department (HOSPITAL_BASED_OUTPATIENT_CLINIC_OR_DEPARTMENT_OTHER): Payer: Medicare Other | Admitting: Radiology

## 2020-11-07 ENCOUNTER — Other Ambulatory Visit: Payer: Self-pay

## 2020-11-07 ENCOUNTER — Encounter (HOSPITAL_BASED_OUTPATIENT_CLINIC_OR_DEPARTMENT_OTHER): Payer: Self-pay

## 2020-11-07 ENCOUNTER — Observation Stay (HOSPITAL_BASED_OUTPATIENT_CLINIC_OR_DEPARTMENT_OTHER)
Admission: EM | Admit: 2020-11-07 | Discharge: 2020-11-08 | Disposition: A | Discharge status: Home / Self Care | Payer: Medicare Other | Attending: Internal Medicine | Admitting: Internal Medicine

## 2020-11-07 DIAGNOSIS — I10 Essential (primary) hypertension: Secondary | ICD-10-CM | POA: Diagnosis present

## 2020-11-07 DIAGNOSIS — E1159 Type 2 diabetes mellitus with other circulatory complications: Secondary | ICD-10-CM | POA: Diagnosis present

## 2020-11-07 DIAGNOSIS — R0789 Other chest pain: Principal | ICD-10-CM | POA: Insufficient documentation

## 2020-11-07 DIAGNOSIS — I639 Cerebral infarction, unspecified: Secondary | ICD-10-CM | POA: Diagnosis present

## 2020-11-07 DIAGNOSIS — E119 Type 2 diabetes mellitus without complications: Secondary | ICD-10-CM | POA: Diagnosis not present

## 2020-11-07 DIAGNOSIS — Z20822 Contact with and (suspected) exposure to covid-19: Secondary | ICD-10-CM | POA: Insufficient documentation

## 2020-11-07 DIAGNOSIS — Z79899 Other long term (current) drug therapy: Secondary | ICD-10-CM | POA: Diagnosis not present

## 2020-11-07 DIAGNOSIS — I2510 Atherosclerotic heart disease of native coronary artery without angina pectoris: Secondary | ICD-10-CM | POA: Diagnosis not present

## 2020-11-07 DIAGNOSIS — I700 Atherosclerosis of aorta: Secondary | ICD-10-CM | POA: Diagnosis not present

## 2020-11-07 DIAGNOSIS — Z7984 Long term (current) use of oral hypoglycemic drugs: Secondary | ICD-10-CM | POA: Diagnosis not present

## 2020-11-07 DIAGNOSIS — R079 Chest pain, unspecified: Secondary | ICD-10-CM | POA: Diagnosis not present

## 2020-11-07 DIAGNOSIS — E785 Hyperlipidemia, unspecified: Secondary | ICD-10-CM | POA: Diagnosis present

## 2020-11-07 LAB — BASIC METABOLIC PANEL
Anion gap: 10 (ref 5–15)
BUN: 15 mg/dL (ref 8–23)
CO2: 27 mmol/L (ref 22–32)
Calcium: 9.3 mg/dL (ref 8.9–10.3)
Chloride: 104 mmol/L (ref 98–111)
Creatinine, Ser: 0.85 mg/dL (ref 0.61–1.24)
GFR, Estimated: >60 mL/min (ref >60)
Glucose, Bld: 80 mg/dL (ref 70–99)
Potassium: 3.9 mmol/L (ref 3.5–5.1)
Sodium: 141 mmol/L (ref 135–145)

## 2020-11-07 LAB — CBC
HCT: 37.5 % — ABNORMAL LOW (ref 39.0–52.0)
Hemoglobin: 11.6 g/dL — ABNORMAL LOW (ref 13.0–17.0)
MCH: 24.6 pg — ABNORMAL LOW (ref 26.0–34.0)
MCHC: 30.9 g/dL (ref 30.0–36.0)
MCV: 79.6 fL — ABNORMAL LOW (ref 80.0–100.0)
Platelets: 285 10*3/uL (ref 150–400)
RBC: 4.71 MIL/uL (ref 4.22–5.81)
RDW: 16.2 % — ABNORMAL HIGH (ref 11.5–15.5)
WBC: 5.6 10*3/uL (ref 4.0–10.5)
nRBC: 0 % (ref 0.0–0.2)

## 2020-11-07 LAB — TROPONIN I (HIGH SENSITIVITY): Troponin I (High Sensitivity): 4 ng/L (ref <18)

## 2020-11-07 MED ORDER — NITROGLYCERIN 0.4 MG SL SUBL
0.4000 mg | SUBLINGUAL_TABLET | SUBLINGUAL | Status: DC | PRN
  Administered 2020-11-07: 0.4 mg via SUBLINGUAL
  Filled 2020-11-07: qty 1

## 2020-11-07 MED ORDER — ASPIRIN 81 MG PO CHEW
324.0000 mg | CHEWABLE_TABLET | Freq: Once | ORAL | Status: AC
  Administered 2020-11-07: 324 mg via ORAL
  Filled 2020-11-07: qty 4

## 2020-11-07 NOTE — ED Provider Notes (Signed)
[facility] EMERGENCY DEPT Provider Note   CSN: [id number] Arrival date & time: 11/07/20  2214     History Chief Complaint  Patient presents with   Chest Pain    Jon [name] is a 65 y.o. male.  Patient with a history of previous stroke, diabetes, hypertension, hyperlipidemia here with chest tightness and chest pain since this morning.  Unclear what time it started but states has been constant all day.  Describes tightness in his center of his chest rating to his back.  It is better when he moves around.  There is no shortness of breath, cough or fever.  No vomiting diaphoresis.  He has never had this kind of pain before.  Denies any abdominal pain.  Reports he thought it was gas and took Tums without relief.  He is never been diagnosed with.  He has had an ulcer in the past requiring surgery.  He has never had this kind of pain before.  Does not consistently have a stress test.  The pain is fairly constant and never goes away and is getting worse.  There is no associated diaphoresis, cough or fever. Patient takes Plavix.  The history is provided by the patient.  Chest Pain Associated symptoms: no abdominal pain, no dizziness, no fever, no headache, no nausea, no shortness of breath, no vomiting and no weakness       Past Medical History:  Diagnosis Date   Carotid artery occlusion    Diabetes mellitus without complication ([place])    type II   History of kidney stones    Hyperlipidemia    Hypertension    Kidney stones    Stomach ulcer    Stroke [facility])    Dec. 14 2018    Patient Active Problem List   Diagnosis Date Noted   Acute CVA (cerebrovascular accident) ([place]) 09/17/2019   Type 2 diabetes mellitus with vascular disease ([place]) 09/17/2019   CVA (cerebral vascular accident) ([place]) 04/26/2017   Routine general medical examination at a health care facility 06/04/2016   Trigger finger, acquired 02/24/2016   Erectile dysfunction 03/17/2015   Dyslipidemia  03/12/2015   HTN (hypertension) 03/12/2015    Past Surgical History:  Procedure Laterality Date   CYSTOSCOPY Left 12/10/2016   Procedure: cystoscopy with left ureteral stone extraction ;  Surgeon: Kathie Rhodes, MD;  Location: [facility];  Service: Urology;  Laterality: Left;   HERNIA REPAIR     Stomach ulcer         Family History  Problem Relation Age of Onset   Diabetes Father    Stroke Father    Hypertension Father    Hypertension Mother    Diabetes Sister    Diabetes Son    Diabetes Daughter     Social History   Tobacco Use   Smoking status: Never   Smokeless tobacco: Never  Vaping Use   Vaping Use: Never used  Substance Use Topics   Alcohol use: Yes    Alcohol/week: 1.0 - 2.0 standard drink    Types: 1 - 2 Glasses of wine per week    Comment: socially   Drug use: No    Home Medications Prior to Admission medications   Medication Sig Start Date End Date Taking? Authorizing Provider  amLODipine (NORVASC) 10 MG tablet TAKE 1 TABLET BY MOUTH  DAILY 01/05/20   Charlott Rakes, MD  atorvastatin (LIPITOR) 80 MG tablet Take 1 tablet (80 mg total) by mouth daily. 05/23/20 08/21/20  Gildardo Pounds, NP  Blood Glucose Monitoring Suppl (ONETOUCH VERIO) w/Device KIT Use to check blood sugar daily. 02/05/19   Charlott Rakes, MD  clopidogrel (PLAVIX) 75 MG tablet TAKE 1 TABLET BY MOUTH  DAILY 09/04/20   Gildardo Pounds, NP  glimepiride (AMARYL) 4 MG tablet TAKE 2 TABLETS BY MOUTH  DAILY WITH BREAKFAST 09/29/20   Gildardo Pounds, NP  glucose blood (ONETOUCH VERIO) test strip USE AS INSTRUCTED TO CHECK BLOOD SUGAR DAILY. Patient taking differently: USE AS INSTRUCTED TO CHECK BLOOD SUGAR DAILY. 08/15/20 08/15/21  Gildardo Pounds, NP  losartan (COZAAR) 50 MG tablet Take 1 tablet (50 mg total) by mouth daily. 05/23/20   Gildardo Pounds, NP  metFORMIN (GLUCOPHAGE) 1000 MG tablet TAKE 1 TABLET BY MOUTH  TWICE DAILY WITH MEALS 10/28/20   Gildardo Pounds, NP  moxifloxacin (VIGAMOX) 0.5 %  ophthalmic solution INSTILL 1 DROP INTO THE LEFT EYE 4 TIMES DAILY STARTING 2 DAYS PRIOR TO SURGERY, AND 2 DROPS THE MORNING OF SURGERY. 05/23/20 05/23/21  Katy Apo, MD  Na Sulfate-K Sulfate-Mg Sulf (SUPREP BOWEL PREP KIT) 17.5-3.13-1.6 GM/177ML SOLN Use as directed 10/24/20   Doran Stabler, MD  Na Sulfate-K Sulfate-Mg Sulf 17.5-3.13-1.6 GM/177ML SOLN use as directed 10/24/20   Doran Stabler, MD  OneTouch Delica Lancets 99B MISC Use as instructed to check blood sugar once daily. 02/05/19   Charlott Rakes, MD  prednisoLONE acetate (PRED FORTE) 1 % ophthalmic suspension INSTILL 1 DROP INTO THE LEFT EYE 4 TIMES DAILY. START 2 DAYS PRIOR TO SURGERY. 07/13/20 07/13/21  Katy Apo, MD  TRULICITY 7.16 RC/7.8LF SOPN INJECT SUBCUTANEOUSLY 0.75  MG ONCE WEEKLY 07/11/20   Charlott Rakes, MD    Allergies    Patient has no known allergies.  Review of Systems   Review of Systems  Constitutional:  Negative for activity change, appetite change and fever.  HENT:  Negative for congestion and rhinorrhea.   Eyes:  Negative for visual disturbance.  Respiratory:  Positive for chest tightness. Negative for shortness of breath.   Cardiovascular:  Positive for chest pain.  Gastrointestinal:  Negative for abdominal pain, nausea and vomiting.  Genitourinary:  Negative for dysuria.  Musculoskeletal:  Negative for arthralgias and myalgias.  Skin:  Negative for rash.  Neurological:  Negative for dizziness, weakness and headaches.   all other systems are negative except as noted in the HPI and PMH.   Physical Exam Updated Vital Signs BP (!) 170/86 (BP Location: Left Arm)   Pulse 90   Temp 98.2 F (36.8 C) (Oral)   Ht _0  (1.702 m)   Wt 85.7 kg   SpO2 98%   BMI 29.60 kg/m   Physical Exam Vitals and nursing note reviewed.  Constitutional:      General: He is not in acute distress.    Appearance: He is well-developed. He is not ill-appearing.  HENT:     Head: Normocephalic and atraumatic.      Mouth/Throat:     Pharynx: No oropharyngeal exudate.  Eyes:     Conjunctiva/sclera: Conjunctivae normal.     Pupils: Pupils are equal, round, and reactive to light.  Neck:     Comments: No meningismus. Cardiovascular:     Rate and Rhythm: Normal rate and regular rhythm.     Heart sounds: Normal heart sounds. No murmur heard. Pulmonary:     Effort: Pulmonary effort is normal. No respiratory distress.     Breath sounds: Normal breath sounds.  Chest:  Chest wall: No tenderness.  Abdominal:     Palpations: Abdomen is soft.     Tenderness: There is no abdominal tenderness. There is no guarding or rebound.  Musculoskeletal:        General: No tenderness. Normal range of motion.     Cervical back: Normal range of motion and neck supple.  Skin:    General: Skin is warm.  Neurological:     Mental Status: He is alert and oriented to person, place, and time.     Cranial Nerves: No cranial nerve deficit.     Motor: No abnormal muscle tone.     Coordination: Coordination normal.     Comments: No ataxia on finger to nose bilaterally. No pronator drift. 5/5 strength throughout. CN 2-12 intact.Equal grip strength. Sensation intact.   Psychiatric:        Behavior: Behavior normal.    ED Results / Procedures / Treatments   Labs (all labs ordered are listed, but only abnormal results are displayed) Labs Reviewed  CBC - Abnormal; Notable for the following components:      Result Value   Hemoglobin 11.6 (*)    HCT 37.5 (*)    MCV 79.6 (*)    MCH 24.6 (*)    RDW 16.2 (*)    All other components within normal limits  RESP PANEL BY RT-PCR (FLU A&B, COVID) ARPGX2  BASIC METABOLIC PANEL  D-DIMER, QUANTITATIVE  TROPONIN I (HIGH SENSITIVITY)  TROPONIN I (HIGH SENSITIVITY)    EKG EKG Interpretation  Date/Time:  Monday November 07 2020 22:23:10 EDT Ventricular Rate:  90 PR Interval:  196 QRS Duration: 88 QT Interval:  352 QTC Calculation: 430 R Axis:   -18 Text Interpretation: Normal  sinus rhythm Septal infarct , age undetermined Abnormal ECG No significant change was found Confirmed by Ezequiel Essex [phone]) on 11/07/2020 11:30:54 PM  Radiology DG Chest 2 View  Result Date: 11/07/2020 CLINICAL DATA:  Chest pain EXAM: CHEST - 2 VIEW COMPARISON:  08/05/2017 FINDINGS: No consolidation, features of edema, pneumothorax, or effusion. Pulmonary vascularity is normally distributed. The cardiomediastinal contours are unremarkable. No acute osseous or soft tissue abnormality. IMPRESSION: No acute cardiopulmonary abnormality. Electronically Signed   By: Lovena Le M.D.   On: 11/07/2020 22:47    Procedures Procedures   Medications Ordered in ED Medications  aspirin chewable tablet 324 mg (has no administration in time range)  nitroGLYCERIN (NITROSTAT) SL tablet 0.4 mg (has no administration in time range)    ED Course  I have reviewed the triage vital signs and the nursing notes.  Pertinent labs & imaging results that were available during my care of the patient were reviewed by me and considered in my medical decision making (see chart for details).    MDM Rules/Calculators/A&P                         Central chest pain and tightness rating to his back all day long.  EKG shows septal Q waves unchanged from previous.  Aspirin and nitroglycerin to be given  Heart score 5-6.  Troponin negative.  D-dimer is negative.  Low suspicion for pulmonary embolism or aortic dissection.  Pain has improved after aspirin nitroglycerin as well as GI cocktail though still somewhat persistent.  Given elevated heart score and ongoing pain we will plan overnight observation.  Morphine given.  Discussed with Dr. Olevia Bowens.  Patient agreeable.  Final Clinical Impression(s) / ED Diagnoses Final diagnoses:  None  Rx / DC Orders ED Discharge Orders     None        Meko Masterson, Annie Main, MD 11/08/20 [phone]

## 2020-11-07 NOTE — ED Triage Notes (Signed)
Pt reports chest tightness that started this morning   PMX - HTN , DM , HDL

## 2020-11-08 ENCOUNTER — Observation Stay (HOSPITAL_COMMUNITY): Payer: Medicare Other

## 2020-11-08 ENCOUNTER — Encounter (HOSPITAL_COMMUNITY): Payer: Self-pay | Admitting: Internal Medicine

## 2020-11-08 DIAGNOSIS — E785 Hyperlipidemia, unspecified: Secondary | ICD-10-CM

## 2020-11-08 DIAGNOSIS — R079 Chest pain, unspecified: Secondary | ICD-10-CM

## 2020-11-08 DIAGNOSIS — Z7984 Long term (current) use of oral hypoglycemic drugs: Secondary | ICD-10-CM | POA: Diagnosis not present

## 2020-11-08 DIAGNOSIS — E119 Type 2 diabetes mellitus without complications: Secondary | ICD-10-CM | POA: Diagnosis not present

## 2020-11-08 DIAGNOSIS — I700 Atherosclerosis of aorta: Secondary | ICD-10-CM | POA: Diagnosis not present

## 2020-11-08 DIAGNOSIS — I10 Essential (primary) hypertension: Secondary | ICD-10-CM

## 2020-11-08 DIAGNOSIS — R072 Precordial pain: Secondary | ICD-10-CM | POA: Diagnosis not present

## 2020-11-08 DIAGNOSIS — Z79899 Other long term (current) drug therapy: Secondary | ICD-10-CM | POA: Diagnosis not present

## 2020-11-08 DIAGNOSIS — I2510 Atherosclerotic heart disease of native coronary artery without angina pectoris: Secondary | ICD-10-CM | POA: Diagnosis not present

## 2020-11-08 DIAGNOSIS — E1159 Type 2 diabetes mellitus with other circulatory complications: Secondary | ICD-10-CM | POA: Diagnosis not present

## 2020-11-08 DIAGNOSIS — R0789 Other chest pain: Secondary | ICD-10-CM | POA: Diagnosis not present

## 2020-11-08 DIAGNOSIS — Z20822 Contact with and (suspected) exposure to covid-19: Secondary | ICD-10-CM | POA: Diagnosis not present

## 2020-11-08 LAB — CBC
HCT: 35.5 % — ABNORMAL LOW (ref 39.0–52.0)
Hemoglobin: 11.3 g/dL — ABNORMAL LOW (ref 13.0–17.0)
MCH: 25.1 pg — ABNORMAL LOW (ref 26.0–34.0)
MCHC: 31.8 g/dL (ref 30.0–36.0)
MCV: 78.9 fL — ABNORMAL LOW (ref 80.0–100.0)
Platelets: 275 10*3/uL (ref 150–400)
RBC: 4.5 MIL/uL (ref 4.22–5.81)
RDW: 16.1 % — ABNORMAL HIGH (ref 11.5–15.5)
WBC: 5.2 10*3/uL (ref 4.0–10.5)
nRBC: 0 % (ref 0.0–0.2)

## 2020-11-08 LAB — LIPID PANEL
Cholesterol: 125 mg/dL (ref 0–200)
HDL: 31 mg/dL — ABNORMAL LOW (ref >40)
LDL Cholesterol: 86 mg/dL (ref 0–99)
Total CHOL/HDL Ratio: 4 RATIO
Triglycerides: 42 mg/dL (ref <150)
VLDL: 8 mg/dL (ref 0–40)

## 2020-11-08 LAB — TSH: TSH: 2.105 u[IU]/mL (ref 0.350–4.500)

## 2020-11-08 LAB — BASIC METABOLIC PANEL
Anion gap: 13 (ref 5–15)
BUN: 12 mg/dL (ref 8–23)
CO2: 23 mmol/L (ref 22–32)
Calcium: 8.6 mg/dL — ABNORMAL LOW (ref 8.9–10.3)
Chloride: 102 mmol/L (ref 98–111)
Creatinine, Ser: 0.67 mg/dL (ref 0.61–1.24)
GFR, Estimated: >60 mL/min (ref >60)
Glucose, Bld: 72 mg/dL (ref 70–99)
Potassium: 3.4 mmol/L — ABNORMAL LOW (ref 3.5–5.1)
Sodium: 138 mmol/L (ref 135–145)

## 2020-11-08 LAB — RESP PANEL BY RT-PCR (FLU A&B, COVID) ARPGX2
Influenza A by PCR: NEGATIVE
Influenza B by PCR: NEGATIVE
SARS Coronavirus 2 by RT PCR: NEGATIVE

## 2020-11-08 LAB — GLUCOSE, CAPILLARY
Glucose-Capillary: 91 mg/dL (ref 70–99)
Glucose-Capillary: 94 mg/dL (ref 70–99)
Glucose-Capillary: 65 mg/dL — ABNORMAL LOW (ref 70–99)

## 2020-11-08 LAB — PHOSPHORUS: Phosphorus: 3.9 mg/dL (ref 2.5–4.6)

## 2020-11-08 LAB — TROPONIN I (HIGH SENSITIVITY)
Troponin I (High Sensitivity): 4 ng/L (ref <18)
Troponin I (High Sensitivity): 5 ng/L (ref <18)
Troponin I (High Sensitivity): 6 ng/L (ref <18)

## 2020-11-08 LAB — MAGNESIUM: Magnesium: 2.1 mg/dL (ref 1.7–2.4)

## 2020-11-08 LAB — HIV ANTIBODY (ROUTINE TESTING W REFLEX): HIV Screen 4th Generation wRfx: NONREACTIVE

## 2020-11-08 LAB — D-DIMER, QUANTITATIVE: D-Dimer, Quant: 0.27 ug/mL-FEU (ref 0.00–0.50)

## 2020-11-08 MED ORDER — INSULIN ASPART 100 UNIT/ML IJ SOLN: 0.0000 [IU] | Freq: Three times a day (TID) | INTRAMUSCULAR | Status: DC

## 2020-11-08 MED ORDER — ENOXAPARIN SODIUM 40 MG/0.4ML IJ SOSY: 40.0000 mg | PREFILLED_SYRINGE | INTRAMUSCULAR | Status: DC

## 2020-11-08 MED ORDER — ALUM & MAG HYDROXIDE-SIMETH 200-200-20 MG/5ML PO SUSP
30.0000 mL | Freq: Once | ORAL | Status: AC
  Administered 2020-11-08: 30 mL via ORAL
  Filled 2020-11-08: qty 30

## 2020-11-08 MED ORDER — ASPIRIN EC 81 MG PO TBEC
81.0000 mg | DELAYED_RELEASE_TABLET | Freq: Every day | ORAL | Status: DC
  Administered 2020-11-08: 81 mg via ORAL
  Filled 2020-11-08: qty 1

## 2020-11-08 MED ORDER — MORPHINE SULFATE (PF) 4 MG/ML IV SOLN
4.0000 mg | Freq: Once | INTRAVENOUS | Status: AC
  Administered 2020-11-08: 4 mg via INTRAVENOUS
  Filled 2020-11-08: qty 1

## 2020-11-08 MED ORDER — COVID-19 MRNA VAC-TRIS(PFIZER) 30 MCG/0.3ML IM SUSP: 0.3000 mL | Freq: Once | INTRAMUSCULAR | Status: DC

## 2020-11-08 MED ORDER — SODIUM CHLORIDE 0.9% FLUSH: 10.0000 mL | Freq: Two times a day (BID) | INTRAVENOUS | Status: DC

## 2020-11-08 MED ORDER — NITROGLYCERIN 0.4 MG SL SUBL
SUBLINGUAL_TABLET | SUBLINGUAL | Status: AC
  Filled 2020-11-08: qty 2

## 2020-11-08 MED ORDER — CLOPIDOGREL BISULFATE 75 MG PO TABS
75.0000 mg | ORAL_TABLET | Freq: Every day | ORAL | Status: DC
  Administered 2020-11-08: 75 mg via ORAL
  Filled 2020-11-08: qty 1

## 2020-11-08 MED ORDER — METOPROLOL TARTRATE 100 MG PO TABS
100.0000 mg | ORAL_TABLET | Freq: Once | ORAL | Status: AC
  Administered 2020-11-08: 100 mg via ORAL
  Filled 2020-11-08: qty 1

## 2020-11-08 MED ORDER — INSULIN ASPART 100 UNIT/ML IJ SOLN
0.0000 [IU] | Freq: Every day | INTRAMUSCULAR | Status: DC
Start: 2020-11-08 — End: 2020-11-09

## 2020-11-08 MED ORDER — NITROGLYCERIN 0.4 MG SL SUBL
0.8000 mg | SUBLINGUAL_TABLET | Freq: Once | SUBLINGUAL | Status: AC
  Administered 2020-11-08: 0.8 mg via SUBLINGUAL

## 2020-11-08 MED ORDER — PANTOPRAZOLE SODIUM 40 MG PO TBEC
40.0000 mg | DELAYED_RELEASE_TABLET | Freq: Every day | ORAL | Status: DC
  Administered 2020-11-08: 40 mg via ORAL
  Filled 2020-11-08: qty 1

## 2020-11-08 MED ORDER — ACETAMINOPHEN 325 MG PO TABS: 650.0000 mg | ORAL_TABLET | ORAL | Status: DC | PRN

## 2020-11-08 MED ORDER — SODIUM CHLORIDE 0.9% FLUSH: 10.0000 mL | INTRAVENOUS | Status: DC | PRN

## 2020-11-08 MED ORDER — IOHEXOL 350 MG/ML SOLN
95.0000 mL | Freq: Once | INTRAVENOUS | Status: AC | PRN
  Administered 2020-11-08: 95 mL via INTRAVENOUS

## 2020-11-08 MED ORDER — MORPHINE SULFATE (PF) 2 MG/ML IV SOLN
2.0000 mg | INTRAVENOUS | Status: DC | PRN
  Administered 2020-11-08: 2 mg via INTRAVENOUS
  Filled 2020-11-08: qty 1

## 2020-11-08 MED ORDER — LOSARTAN POTASSIUM 50 MG PO TABS: 50.0000 mg | ORAL_TABLET | Freq: Every day | ORAL | Status: DC

## 2020-11-08 MED ORDER — ONDANSETRON HCL 4 MG/2ML IJ SOLN: 4.0000 mg | Freq: Four times a day (QID) | INTRAMUSCULAR | Status: DC | PRN

## 2020-11-08 MED ORDER — ASPIRIN 81 MG PO TBEC
81.0000 mg | DELAYED_RELEASE_TABLET | Freq: Every day | ORAL | 11 refills | Status: DC
  Filled 2020-11-08: qty 90, 90d supply, fill #0

## 2020-11-08 MED ORDER — PANTOPRAZOLE SODIUM 40 MG PO TBEC
40.0000 mg | DELAYED_RELEASE_TABLET | Freq: Every day | ORAL | 1 refills | Status: DC
  Filled 2020-11-08: qty 30, 30d supply, fill #0
  Filled 2020-12-13: qty 30, 30d supply, fill #1

## 2020-11-08 MED ORDER — PREDNISOLONE ACETATE 1 % OP SUSP
1.0000 [drp] | Freq: Every day | OPHTHALMIC | Status: DC
  Filled 2020-11-08: qty 5

## 2020-11-08 MED ORDER — AMLODIPINE BESYLATE 10 MG PO TABS
10.0000 mg | ORAL_TABLET | Freq: Every day | ORAL | Status: DC
  Administered 2020-11-08: 10 mg via ORAL
  Filled 2020-11-08: qty 1

## 2020-11-08 MED ORDER — ATORVASTATIN CALCIUM 80 MG PO TABS
80.0000 mg | ORAL_TABLET | Freq: Every day | ORAL | Status: DC
  Administered 2020-11-08: 80 mg via ORAL
  Filled 2020-11-08: qty 1

## 2020-11-08 MED ORDER — LIDOCAINE VISCOUS HCL 2 % MT SOLN
15.0000 mL | Freq: Once | OROMUCOSAL | Status: AC
  Administered 2020-11-08: 15 mL via ORAL
  Filled 2020-11-08: qty 15

## 2020-11-08 NOTE — H&P (Signed)
History and Physical    Jon [name] SLH:[id number] DOB: [birth year] DOA: 11/07/2020  PCP: Gildardo Pounds, NP Consultants:  Loletha Carrow - GITrula Slade - vascular; Jannifer Franklin - neurology Patient coming from:  Home - lives with girlfriend; NOK: Jon [name], significant other, [phone]  Chief Complaint: chest pain  HPI: Jon [name] is a 65 y.o. [place] male with medical history significant of DM; HTN; HLD; CVA (2018); and carotid stenosis presenting with CP.   He started experiencing chest tightness yesterday AM.  Nothing made it worse, was consistent.  It was better with rest.  He still feels it worsen when he goes to raise his head but it is present at rest.  It sometimes radiated from his chest to his back.  Pain is substernal in nature.  He has never had similar pain.  No SOB.  No diaphoresis or nausea.  He thinks he had a stress test when he had his stroke but he isn't sure.    ED Course: Carryover, per Dr. Nevada Crane:  64year-old male with a past medical history of type II DM, hyperlipidemia, hypertension, history of CVA, carotid artery disease,  uro-lithiasis who presented to the emergency department  with precordial chest pain.  He has a heart score of at least 5. first troponin was 4 ng/L.  Second troponin still pending.  Placed n.p.o. in case a stress testing can be done later in the morning.   Review of Systems: As per HPI; otherwise review of systems reviewed and negative.   Ambulatory Status:  Ambulates without assistance  COVID Vaccine Status:  Complete  Past Medical History:  Diagnosis Date   Carotid artery occlusion    Diabetes mellitus without complication ([place])    type II   History of kidney stones    Hyperlipidemia    Hypertension    Kidney stones    Stomach ulcer    Stroke [facility])    Dec. 14 2018    Past Surgical History:  Procedure Laterality Date   CYSTOSCOPY Left 12/10/2016   Procedure: cystoscopy with left ureteral stone extraction ;  Surgeon: Kathie Rhodes,  MD;  Location: [facility];  Service: Urology;  Laterality: Left;   HERNIA REPAIR     Stomach ulcer      Social History   Socioeconomic History   Marital status: Divorced    Spouse name: Not on file   Number of children: 4   Years of education: 16   Highest education level: Not on file  Occupational History   Occupation: [employer] driver  Tobacco Use   Smoking status: Never   Smokeless tobacco: Never  Vaping Use   Vaping Use: Never used  Substance and Sexual Activity   Alcohol use: Yes    Alcohol/week: 1.0 - 2.0 standard drink    Types: 1 - 2 Glasses of wine per week    Comment: socially   Drug use: No   Sexual activity: Not Currently  Other Topics Concern   Not on file  Social History Narrative   Fun: Politics   Social Determinants of Health   Financial Resource Strain: Not on file  Food Insecurity: Not on file  Transportation Needs: Not on file  Physical Activity: Not on file  Stress: Not on file  Social Connections: Not on file  Intimate Partner Violence: Not on file    No Known Allergies  Family History  Problem Relation Age of Onset   Diabetes Father    Stroke Father    Hypertension Father  Hypertension Mother    Diabetes Sister    Diabetes Son    Diabetes Daughter     Prior to Admission medications   Medication Sig Start Date End Date Taking? Authorizing Provider  amLODipine (NORVASC) 10 MG tablet TAKE 1 TABLET BY MOUTH  DAILY Patient taking differently: Take 10 mg by mouth daily. 01/05/20  Yes Charlott Rakes, MD  atorvastatin (LIPITOR) 80 MG tablet Take 1 tablet (80 mg total) by mouth daily. 05/23/20 11/08/20 Yes Gildardo Pounds, NP  clopidogrel (PLAVIX) 75 MG tablet TAKE 1 TABLET BY MOUTH  DAILY Patient taking differently: Take 75 mg by mouth daily. 09/04/20  Yes Gildardo Pounds, NP  glimepiride (AMARYL) 4 MG tablet TAKE 2 TABLETS BY MOUTH  DAILY WITH BREAKFAST Patient taking differently: Take 8 mg by mouth daily with breakfast. 09/29/20  Yes Gildardo Pounds, NP  losartan (COZAAR) 50 MG tablet Take 1 tablet (50 mg total) by mouth daily. 05/23/20  Yes Gildardo Pounds, NP  metFORMIN (GLUCOPHAGE) 1000 MG tablet TAKE 1 TABLET BY MOUTH  TWICE DAILY WITH MEALS Patient taking differently: Take 1,000 mg by mouth 2 (two) times daily with a meal. 10/28/20  Yes Gildardo Pounds, NP  prednisoLONE acetate (PRED FORTE) 1 % ophthalmic suspension INSTILL 1 DROP INTO THE LEFT EYE 4 TIMES DAILY. START 2 DAYS PRIOR TO SURGERY. Patient taking differently: Place 1 drop into the right eye daily. 07/13/20 07/13/21 Yes Katy Apo, MD  TRULICITY 4.01 UU/7.2ZD SOPN INJECT SUBCUTANEOUSLY 0.75  MG ONCE WEEKLY Patient taking differently: Inject 0.75 mg into the skin once a week. 07/11/20  Yes Charlott Rakes, MD  Blood Glucose Monitoring Suppl (ONETOUCH VERIO) w/Device KIT Use to check blood sugar daily. 02/05/19   Charlott Rakes, MD  glucose blood (ONETOUCH VERIO) test strip USE AS INSTRUCTED TO CHECK BLOOD SUGAR DAILY. Patient taking differently: USE AS INSTRUCTED TO CHECK BLOOD SUGAR DAILY. 08/15/20 08/15/21  Gildardo Pounds, NP  moxifloxacin (VIGAMOX) 0.5 % ophthalmic solution INSTILL 1 DROP INTO THE LEFT EYE 4 TIMES DAILY STARTING 2 DAYS PRIOR TO SURGERY, AND 2 DROPS THE MORNING OF SURGERY. Patient not taking: Reported on 11/08/2020 05/23/20 05/23/21  Katy Apo, MD  Na Sulfate-K Sulfate-Mg Sulf [facility] BOWEL PREP KIT) 17.5-3.13-1.6 GM/177ML SOLN Use as directed 10/24/20   Doran Stabler, MD  Na Sulfate-K Sulfate-Mg Sulf 17.5-3.13-1.6 GM/177ML SOLN use as directed 10/24/20   Doran Stabler, MD  OneTouch Delica Lancets 66Y MISC Use as instructed to check blood sugar once daily. 02/05/19   Charlott Rakes, MD    Physical Exam: Vitals:   11/08/20 0325 11/08/20 0521 11/08/20 0751 11/08/20 1125  BP: (!) 149/84 (!) 152/80 (!) 150/82 (!) 144/78  Pulse: 83 81 82 81  Resp: $Remo'16 18 18 18  'qPzjh$ Temp: 97.7 F (36.5 C) 97.6 F (36.4 C) 97.8 F (36.6 C) 97.6 F (36.4 C)   TempSrc: Oral Oral Oral Oral  SpO2: 97% 97% 92% 97%  Weight:      Height:         General:  Appears calm and comfortable and is in NAD Eyes:   EOMI, normal lids, iris ENT:  grossly normal hearing, lips & tongue, mmm Neck:  no LAD, masses or thyromegaly Cardiovascular:  RRR, no m/r/g. No LE edema.  Respiratory:   CTA bilaterally with no wheezes/rales/rhonchi.  Normal respiratory effort. Abdomen:  soft, NT, ND Skin:  no rash or induration seen on limited exam Musculoskeletal:  grossly normal tone BUE/BLE,  good ROM, no bony abnormality Psychiatric:  grossly normal mood and affect, speech fluent and appropriate, AOx3 Neurologic:  CN 2-12 grossly intact, moves all extremities in coordinated fashion    Radiological Exams on Admission: Independently reviewed - see discussion in A/P where applicable  DG Chest 2 View  Result Date: 11/07/2020 CLINICAL DATA:  Chest pain EXAM: CHEST - 2 VIEW COMPARISON:  08/05/2017 FINDINGS: No consolidation, features of edema, pneumothorax, or effusion. Pulmonary vascularity is normally distributed. The cardiomediastinal contours are unremarkable. No acute osseous or soft tissue abnormality. IMPRESSION: No acute cardiopulmonary abnormality. Electronically Signed   By: Lovena Le M.D.   On: 11/07/2020 22:47    EKG: Independently reviewed.  NSR with rate 80; 1st degree AV block; no evidence of acute ischemia   Labs on Admission: I have personally reviewed the available labs and imaging studies at the time of the admission.  Pertinent labs:   Normal BMP HS troponin 4, 4, 5 Hgb 11.6 -> 11.3; 12.8 on 06/13/20, 12.6 on 09/12/20 COVID/flu negative D-dimer 0.27   Assessment/Plan Principal Problem:   Chest pain Active Problems:   Dyslipidemia   HTN (hypertension)   CVA (cerebral vascular accident) ([place])   Type 2 diabetes mellitus with vascular disease ([place])   Chest pain -Patient with substernal chest pressure that has been mostly constant but worse  with movement (?MSK), improved with rest. -1-2/3 typical symptoms suggestive of noncardiac vs. Atypical chest pain.  -CXR unremarkable.   -Initial cardiac HS troponin negative.  -EKG not indicative of acute ischemia.   -HEART pathway score is 6, indicating that the patient has an elevated risk score and requires further evaluation. -Will plan to place in observation status on telemetry to rule out ACS by overnight observation.  -Start ASA 81 mg daily; continue Plavix -Risk factor stratification with HgbA1c and FLP; will also check TSH and UDS -Cardiology consultation  -NPO for possible stress test vs. Coronary CTA  HTN -Continue Norvasc, Cozaar -Will also add prn hydralazine  HLD -Continue Lipitor -Check lipids  DM -Last A1c was 5.2 -Hold home PO meds (Amaryl, Glucophage, Trulicity) -Will cover with moderate-scale SSI for now  H/o CVA/carotid stenosis -CVA most likely due to intracranial disease rather than carotid stenosis -Needs annual carotid evaluation (April)   Note: This patient has been tested and is negative for the novel coronavirus COVID-19. He has been fully vaccinated against COVID-19; he is due for a booster and this has been ordered.    DVT prophylaxis: Lovenox  Code Status:  Full - confirmed with patient Family Communication: None present Disposition Plan:  The patient is from: home  Anticipated d/c is to: home without [facility] services   Anticipated d/c date will depend on clinical response to treatment, but possibly as early as tomorrow if he has excellent response to treatment  Patient is currently: acutely ill Consults called: Cardiology  Admission status: It is my clinical opinion that referral for OBSERVATION is reasonable and necessary in this patient based on the above information provided. The aforementioned taken together are felt to place the patient at high risk for further clinical deterioration. However it is anticipated that the patient may be  medically stable for discharge from the hospital within 24 to 48 hours.      Karmen Bongo MD [facility]   How to contact the [facility] Attending or Consulting provider [place] or covering provider during after hours [place], for this patient?  Check the care team in [facility] and look  for a) attending/consulting [place] provider listed and b) the [facility] team listed Log into [email or web address] and use [place]'s universal password to access. If you do not have the password, please contact the hospital operator. Locate the [facility] provider you are looking for under [facility] and page to a number that you can be directly reached. If you still have difficulty reaching the provider, please page the [facility] (Director on Call) for the Hospitalists listed on amion for assistance.   11/08/2020, 11:53 AM

## 2020-11-08 NOTE — Consult Note (Signed)
Cardiology Consultation:   Patient ID: Jon [name] MRN: [id number]; DOB: 06/17/1955  Admit date: 11/07/2020 Date of Consult: 11/08/2020  PCP:  Jon Pounds, NP   [facility] Providers Cardiologist:  None       new Patient Profile:   Jon [name] is a 66 y.o. male with a hx of DM-2, HTN, HLD, lt brain CVA , carotid disease who is being seen 11/08/2020 for the evaluation of chest pain at the request of Dr. Lorin [name].  History of Present Illness:   Jon [name] with above hx and echo 09/2019 with EF 60-65% G1DD, moderate LVH,  presented to ER 11/07/20 with chest tightness/chest pain and constant all day.  No associated symptoms.   Has had GI ulcer in past but this was different.  ASA and GI cocktail with some improvement.  He was at rest when pain began a squeezing pressure pain.  7/10 in severity he did not believe NTG helped much.  This AM recurrent pain of 7/10 otherwise a low grade discomfort.  Morphine this AM   Na 138, K+ 3.4 BUN 12 Cr 0.67 Mg+ 2.1  Hs troponin 4,5,6  Hgb 11.3, plts 275 WBC 5.2  Ddimer 0.27   EKG:  The EKG was personally reviewed and demonstrates:  SR with 1st degree AV block 232 ms LAD and no acute changes from 2021 Telemetry:  Telemetry was personally reviewed and demonstrates:  SR  BP on arrival to ER 170/86 now 150/82 to 133/80  P 80s afebrile   Past Medical History:  Diagnosis Date   Carotid artery occlusion    Diabetes mellitus without complication (Jon [name])    type II   History of kidney stones    Hyperlipidemia    Hypertension    Kidney stones    Stomach ulcer    Stroke Jon [name])    Dec. 14 2018    Past Surgical History:  Procedure Laterality Date   CYSTOSCOPY Left 12/10/2016   Procedure: cystoscopy with left ureteral stone extraction ;  Surgeon: Kathie Rhodes, MD;  Location: [facility];  Service: Urology;  Laterality: Left;   HERNIA REPAIR     Stomach ulcer       Home Medications:  Prior to Admission medications   Medication Sig Start Date End  Date Taking? Authorizing Provider  amLODipine (NORVASC) 10 MG tablet TAKE 1 TABLET BY MOUTH  DAILY Patient taking differently: Take 10 mg by mouth daily. 01/05/20  Yes Charlott Rakes, MD  atorvastatin (LIPITOR) 80 MG tablet Take 1 tablet (80 mg total) by mouth daily. 05/23/20 11/08/20 Yes Jon Pounds, NP  clopidogrel (PLAVIX) 75 MG tablet TAKE 1 TABLET BY MOUTH  DAILY Patient taking differently: Take 75 mg by mouth daily. 09/04/20  Yes Jon Pounds, NP  glimepiride (AMARYL) 4 MG tablet TAKE 2 TABLETS BY MOUTH  DAILY WITH BREAKFAST Patient taking differently: Take 8 mg by mouth daily with breakfast. 09/29/20  Yes Jon Pounds, NP  losartan (COZAAR) 50 MG tablet Take 1 tablet (50 mg total) by mouth daily. 05/23/20  Yes Jon Pounds, NP  metFORMIN (GLUCOPHAGE) 1000 MG tablet TAKE 1 TABLET BY MOUTH  TWICE DAILY WITH MEALS Patient taking differently: Take 1,000 mg by mouth 2 (two) times daily with a meal. 10/28/20  Yes Jon Pounds, NP  prednisoLONE acetate (PRED FORTE) 1 % ophthalmic suspension INSTILL 1 DROP INTO THE LEFT EYE 4 TIMES DAILY. START 2 DAYS PRIOR TO SURGERY. Patient taking differently: Place 1 drop into the right  eye daily. 07/13/20 07/13/21 Yes Katy Apo, MD  TRULICITY 1.54 MG/8.6PY SOPN INJECT SUBCUTANEOUSLY 0.75  MG ONCE WEEKLY Patient taking differently: Inject 0.75 mg into the skin once a week. 07/11/20  Yes Charlott Rakes, MD  Blood Glucose Monitoring Suppl (ONETOUCH VERIO) w/Device KIT Use to check blood sugar daily. 02/05/19   Charlott Rakes, MD  glucose blood (ONETOUCH VERIO) test strip USE AS INSTRUCTED TO CHECK BLOOD SUGAR DAILY. Patient taking differently: USE AS INSTRUCTED TO CHECK BLOOD SUGAR DAILY. 08/15/20 08/15/21  Jon Pounds, NP  moxifloxacin (VIGAMOX) 0.5 % ophthalmic solution INSTILL 1 DROP INTO THE LEFT EYE 4 TIMES DAILY STARTING 2 DAYS PRIOR TO SURGERY, AND 2 DROPS THE MORNING OF SURGERY. Patient not taking: Reported on 11/08/2020 05/23/20 05/23/21   Katy Apo, MD  Na Sulfate-K Sulfate-Mg Sulf [facility] BOWEL PREP KIT) 17.5-3.13-1.6 GM/177ML SOLN Use as directed 10/24/20   Doran Stabler, MD  Na Sulfate-K Sulfate-Mg Sulf 17.5-3.13-1.6 GM/177ML SOLN use as directed 10/24/20   Doran Stabler, MD  OneTouch Delica Lancets 19J MISC Use as instructed to check blood sugar once daily. 02/05/19   Charlott Rakes, MD    Inpatient Medications: Scheduled Meds:  amLODipine  10 mg Oral Daily   Continuous Infusions:  PRN Meds: morphine injection, nitroGLYCERIN  Allergies:   No Known Allergies  Social History:   Social History   Socioeconomic History   Marital status: Divorced    Spouse name: Not on file   Number of children: 4   Years of education: 16   Highest education level: Not on file  Occupational History   Occupation: [employer] driver  Tobacco Use   Smoking status: Never   Smokeless tobacco: Never  Vaping Use   Vaping Use: Never used  Substance and Sexual Activity   Alcohol use: Yes    Alcohol/week: 1.0 - 2.0 standard drink    Types: 1 - 2 Glasses of wine per week    Comment: socially   Drug use: No   Sexual activity: Not Currently  Other Topics Concern   Not on file  Social History Narrative   Fun: Politics   Social Determinants of Health   Financial Resource Strain: Not on file  Food Insecurity: Not on file  Transportation Needs: Not on file  Physical Activity: Not on file  Stress: Not on file  Social Connections: Not on file  Intimate Partner Violence: Not on file    Family History:    Family History  Problem Relation Age of Onset   Diabetes Father    Stroke Father    Hypertension Father    Hypertension Mother    Diabetes Sister    Diabetes Son    Diabetes Daughter      ROS:  Please see the history of present illness.  General:no colds or fevers, no weight changes Skin:no rashes or ulcers HEENT:no blurred vision, no congestion CV:see HPI PUL:see HPI GI:no diarrhea constipation or melena, no  indigestion GU:no hematuria, no dysuria, hx of kidney stones MS:no joint pain, no claudication Neuro:no syncope, no lightheadedness Endo:+ diabetes, no thyroid disease  All other ROS reviewed and negative.     Physical Exam/Data:   Vitals:   11/08/20 0245 11/08/20 0325 11/08/20 0521 11/08/20 0751  BP: (!) 161/86 (!) 149/84 (!) 152/80 (!) 150/82  Pulse: 79 83 81 82  Resp: _0 Temp:  97.7 F (36.5 C) 97.6 F (36.4 C) 97.8 F (36.6 C)  TempSrc:  Oral Oral  Oral  SpO2: 95% 97% 97% 92%  Weight:      Height:       No intake or output data in the 24 hours ending 11/08/20 1124 Last 3 Weights 11/07/2020 09/12/2020 09/05/2020  Weight (lbs) 189 lb 193 lb 193 lb  Weight (kg) 85.73 kg 87.544 kg 87.544 kg     Body mass index is 29.6 kg/m.  General:  Well nourished, well developed, in no acute distress HEENT: normal Lymph: no adenopathy Neck: no JVD Endocrine:  No thryomegaly Vascular: No carotid bruits ; pedal pulses 2+ bilaterally  Cardiac:  normal S1, S2; RRR; no murmur gallup rub or click Lungs:  clear to auscultation bilaterally, no wheezing, rhonchi or rales  Abd: soft, nontender, no hepatomegaly  Ext: no edema Musculoskeletal:  No deformities, BUE and BLE strength normal and equal Skin: warm and dry  Neuro:  alert and oriented X 3 MAE follows commands, no focal abnormalities noted Psych:  Normal affect   Relevant CV Studies: Echo 09/18/19 IMPRESSIONS     1. Left ventricular ejection fraction, by estimation, is 60 to 65%. The  left ventricle has normal function. The left ventricle has no regional  wall motion abnormalities. There is moderate concentric left ventricular  hypertrophy. Left ventricular  diastolic parameters were normal.   2. Right ventricular systolic function is normal. The right ventricular  size is normal. There is normal pulmonary artery systolic pressure.   3. The mitral valve is normal in structure. Trivial mitral valve  regurgitation. No  evidence of mitral stenosis.   4. The aortic valve is tricuspid. Aortic valve regurgitation is not  visualized. No aortic stenosis is present.   5. The inferior vena cava is normal in size with greater than 50%  respiratory variability, suggesting right atrial pressure of 3 mmHg.   Conclusion(s)/Recommendation(s): No intracardiac source of embolism  detected on this transthoracic study. A transesophageal echocardiogram is  recommended to exclude cardiac source of embolism if clinically indicated.   FINDINGS   Left Ventricle: Left ventricular ejection fraction, by estimation, is 60  to 65%. The left ventricle has normal function. The left ventricle has no  regional wall motion abnormalities. The left ventricular internal cavity  size was normal in size. There is   moderate concentric left ventricular hypertrophy. Left ventricular  diastolic parameters were normal.   Right Ventricle: The right ventricular size is normal. No increase in  right ventricular wall thickness. Right ventricular systolic function is  normal. There is normal pulmonary artery systolic pressure. The tricuspid  regurgitant velocity is 2.33 m/s, and   with an assumed right atrial pressure of 3 mmHg, the estimated right  ventricular systolic pressure is 74.2 mmHg.   Left Atrium: Left atrial size was normal in size.   Right Atrium: Right atrial size was normal in size.   Pericardium: There is no evidence of pericardial effusion.   Mitral Valve: The mitral valve is normal in structure. Trivial mitral  valve regurgitation. No evidence of mitral valve stenosis.   Tricuspid Valve: The tricuspid valve is normal in structure. Tricuspid  valve regurgitation is trivial. No evidence of tricuspid stenosis.   Aortic Valve: The aortic valve is tricuspid. . There is mild thickening  and mild calcification of the aortic valve. Aortic valve regurgitation is  not visualized. No aortic stenosis is present. There is mild  thickening of  the aortic valve. There is mild  calcification of the aortic valve.   Pulmonic Valve: The pulmonic valve was  not well visualized. Pulmonic valve  regurgitation is trivial. No evidence of pulmonic stenosis.   Aorta: The aortic root, ascending aorta and aortic arch are all  structurally normal, with no evidence of dilitation or obstruction.   Venous: The inferior vena cava is normal in size with greater than 50%  respiratory variability, suggesting right atrial pressure of 3 mmHg.   IAS/Shunts: No atrial level shunt detected by color flow Doppler.       Laboratory Data:  High Sensitivity Troponin:   Recent Labs  Lab 11/07/20 2235 11/08/20 0040 11/08/20 0550 11/08/20 0712  TROPONINIHS _0 Chemistry Recent Labs  Lab 11/07/20 2235 11/08/20 0712  NA 141 138  K 3.9 3.4*  CL 104 102  CO2 27 23  GLUCOSE 80 72  BUN 15 12  CREATININE 0.85 0.67  CALCIUM 9.3 8.6*  GFRNONAA >60 >60  ANIONGAP 10 13    No results for input(s): PROT, ALBUMIN, AST, ALT, ALKPHOS, BILITOT in the last 168 hours. Hematology Recent Labs  Lab 11/07/20 2235 11/08/20 0712  WBC 5.6 5.2  RBC 4.71 4.50  HGB 11.6* 11.3*  HCT 37.5* 35.5*  MCV 79.6* 78.9*  MCH 24.6* 25.1*  MCHC 30.9 31.8  RDW 16.2* 16.1*  PLT 285 275   BNPNo results for input(s): BNP, PROBNP in the last 168 hours.  DDimer  Recent Labs  Lab 11/08/20 0008  DDIMER 0.27     Radiology/Studies:  DG Chest 2 View  Result Date: 11/07/2020 CLINICAL DATA:  Chest pain EXAM: CHEST - 2 VIEW COMPARISON:  08/05/2017 FINDINGS: No consolidation, features of edema, pneumothorax, or effusion. Pulmonary vascularity is normally distributed. The cardiomediastinal contours are unremarkable. No acute osseous or soft tissue abnormality. IMPRESSION: No acute cardiopulmonary abnormality. Electronically Signed   By: Lovena Le M.D.   On: 11/07/2020 22:47     Assessment and Plan:   Chest pain with neg Troponins and neg EKG but  multiple risk factors for CAD with HTN, DM, HLD and PAD.  Pain a pressure squeezing pain, last episode this AM and mild discomfort now.  At worst 7/10.  No prior chest pain.  Pt drives for a living. HTN stable now on cozaar continue HLD on lipitor 80 continue (Jan/2022 tchol 137, TG 134, HDL 34, LDL 79)  CVA on plavix DM-2 per IM (A1C in May 2022 was 7.1)    Risk Assessment/Risk Scores:     HEAR Score (for undifferentiated chest pain):  HEAR Score: 6          For questions or updates, please contact [place] Please consult [email or web address] for contact info under    Signed, Cecilie Kicks, NP  11/08/2020 11:24 AM

## 2020-11-08 NOTE — ED Notes (Signed)
Pt has been transferred to [place] via [facility]

## 2020-11-08 NOTE — Discharge Summary (Signed)
Discharge Summary  Jon [name] VEH:[id number] DOB: 1955-06-27  PCP: Gildardo Pounds, NP  Admit date: 11/07/2020 Discharge date: 11/08/2020   Time spent: 35 minutes  Admitted From: Home (via [facility]) Disposition:  Home  Recommendations for Outpatient Follow-up:  Follow up with PCP in 1 week    Discharge Diagnoses:  Active Hospital Problems   Diagnosis Date Noted   Chest pain 11/08/2020   Type 2 diabetes mellitus with vascular disease ([place]) 09/17/2019   CVA (cerebral vascular accident) ([place]) 04/26/2017   Dyslipidemia 03/12/2015   HTN (hypertension) 03/12/2015    Resolved Hospital Problems  No resolved problems to display.    Discharge Condition: Stable   CODE STATUS:Full  Diet recommendation:  Heart Healthy  Vitals:   11/08/20 1125 11/08/20 1657  BP: (!) 144/78 126/78  Pulse: 81 62  Resp: 18 18  Temp: 97.6 F (36.4 C) 98.6 F (37 C)  SpO2: 97% 96%    History of present illness:  Jon [name] is a 65 y.o. year old male with medical history significant for DM; HTN; HLD; CVA (2018); and carotid stenosis who presented on 11/07/2020 with chest pain and was found to have an unremarkable coronary CTA, likely non-cardiac cause for chest pain. Remaining hospital course addressed in problem based format below:   Hospital Course:   Chest pain -Patient with substernal chest pressure that has been mostly constant but worse with movement (?MSK), improved with rest. -1-2/3 typical symptoms suggestive of noncardiac vs. Atypical chest pain. -CXR unremarkable.   -Initial cardiac HS troponin negative.  -EKG not indicative of acute ischemia.   -HEART pathway score is 6 -Start ASA 81 mg daily; continue Plavix -Risk factor stratification with HgbA1c and FLP; will also check TSH and UDS -Cardiology consultation -Coronary CTA with non-obstructive disease - recommended to start PPI and ok for discharge per Dr. Oval Linsey.   HTN -Continue Norvasc, Cozaar   HLD -Continue  Lipitor -Lipids: 125/31/86/42   DM -Last A1c was 5.2 -Resume home PO meds (Amaryl, Glucophage, Trulicity)   H/o CVA/carotid stenosis -CVA most likely due to intracranial disease rather than carotid stenosis -Needs annual carotid evaluation (April)   Consultations: Cardiology  Procedures/Studies: Coronary CTA  Discharge Exam: BP 126/78 (BP Location: Left Arm)   Pulse 62   Temp 98.6 F (37 C) (Oral)   Resp 18   Ht $R'5\' 7"'SI$  (1.702 m)   Wt 85.7 kg   SpO2 96%   BMI 29.60 kg/m     Discharge Instructions Follow up: Please make an appointment to see your primary physician for follow up within 7 days of hospital discharge.  At that appointment:  -We routinely change or add medications that can affect your baseline labs and fluid status; therefore, you may require repeat blood work or tests during your next visit with your PCP.  Your PCP may decide not to get them or may add new tests based on their clinical decision.  -Please get all medicines reviewed and adjusted.  -Please request that your primary physician go over all hospital tests and procedure/radiological results at the follow up.  Please get all hospital records sent to your physician by signing a hospital release before you go home.  Activity: As tolerated with fall precautions; use Girdler/cane & assistance as needed.  Disposition: Home    Diet:   Heart Healthy.  For Heart failure patients - Check your weight at the same time daily.  If you gain over 2 pounds, develop leg swelling, or experience more shortness  of breath/chest pain, call your Primary MD immediately. Follow cardiac low salt diet with no more than 1.5 liters/day of fluid.  For all patients - If you experience worsening of your admission symptoms or develop shortness of breath, life threatening emergency, suicidal or homicidal thoughts you must seek medical attention immediately by calling 911 or calling your MD immediately.  Read complete instructions along  with all the possible side effects for all the medicines you take and that have been prescribed to you. Take any new medicines after you have completely understood and accept all the possible adverse reactions/side effects.   Do not drive, operate heavy machinery, perform activities at heights, swimming or participation in water activities or provide baby sitting services if your were admitted for syncope/seizures until you have seen by Primary MD/Neurologist and advised to do so.  Do not drive when taking pain medications.    Do not take more than prescribed pain, sleep and anxiety medications.  Special Instructions: If you have smoked or chewed Tobacco  in the last 2 yrs please stop smoking; also stop any regular Alcohol and/or any Recreational drug use including marijuana.  Wear Seat belts while driving.   Please note:  You were cared for by a hospitalist during your hospital stay. If you have any questions about your discharge medications or the care you received while you were in the hospital, you can call the unit and asked to speak with the hospitalist on call. Once you are discharged, your primary care physician will handle any further medical issues. Please note that NO REFILLS for any discharge medications will be authorized, as it is imperative that you return to your primary care physician (or establish a relationship with a primary care physician if you do not have one) for your aftercare needs so that they can reassess your need for medications and monitor your lab values.  Discharge Instructions     Call MD for:  difficulty breathing, headache or visual disturbances   Complete by: As directed    Call MD for:  persistant dizziness or light-headedness   Complete by: As directed    Call MD for:  severe uncontrolled pain   Complete by: As directed    Diet - low sodium heart healthy   Complete by: As directed    Discharge instructions   Complete by: As directed    Follow up:  Please make an appointment to see your primary physician for follow up within 7 days of hospital discharge.  At that appointment:  -We routinely change or add medications that can affect your baseline labs and fluid status; therefore, you may require repeat blood work or tests during your next visit with your PCP.  Your PCP may decide not to get them or may add new tests based on their clinical decision.  -Please get all medicines reviewed and adjusted.  -Please request that your primary physician go over all hospital tests and procedure/radiological results at the follow up.  Please get all hospital records sent to your physician by signing a hospital release before you go home.  Activity: As tolerated with fall precautions; use [employer]/cane & assistance as needed.  Disposition: Home    Diet:   Heart Healthy.  For Heart failure patients - Check your weight at the same time daily.  If you gain over 2 pounds, develop leg swelling, or experience more shortness of breath/chest pain, call your Primary MD immediately. Follow cardiac low salt diet with no more than 1.5  liters/day of fluid.  For all patients - If you experience worsening of your admission symptoms or develop shortness of breath, life threatening emergency, suicidal or homicidal thoughts you must seek medical attention immediately by calling 911 or calling your MD immediately.  Read complete instructions along with all the possible side effects for all the medicines you take and that have been prescribed to you. Take any new medicines after you have completely understood and accept all the possible adverse reactions/side effects.   Do not drive, operate heavy machinery, perform activities at heights, swimming or participation in water activities or provide baby sitting services if your were admitted for syncope/seizures until you have seen by Primary MD/Neurologist and advised to do so.  Do not drive when taking pain medications.    Do  not take more than prescribed pain, sleep and anxiety medications.  Special Instructions: If you have smoked or chewed Tobacco  in the last 2 yrs please stop smoking; also stop any regular Alcohol and/or any Recreational drug use including marijuana.  Wear Seat belts while driving.   Please note:  You were cared for by a hospitalist during your hospital stay. If you have any questions about your discharge medications or the care you received while you were in the hospital, you can call the unit and asked to speak with the hospitalist on call. Once you are discharged, your primary care physician will handle any further medical issues. Please note that NO REFILLS for any discharge medications will be authorized, as it is imperative that you return to your primary care physician (or establish a relationship with a primary care physician if you do not have one) for your aftercare needs so that they can reassess your need for medications and monitor your lab values.   Increase activity slowly   Complete by: As directed       Allergies as of 11/08/2020   No Known Allergies      Medication List     STOP taking these medications    OneTouch Delica Lancets 39J Misc   OneTouch Verio test strip Generic drug: glucose blood   OneTouch Verio w/Device Kit       TAKE these medications    amLODipine 10 MG tablet Commonly known as: NORVASC TAKE 1 TABLET BY MOUTH  DAILY   aspirin 81 MG EC tablet Take 1 tablet (81 mg total) by mouth daily. Swallow whole. Start taking on: November 09, 2020   atorvastatin 80 MG tablet Commonly known as: LIPITOR Take 1 tablet (80 mg total) by mouth daily.   clopidogrel 75 MG tablet Commonly known as: PLAVIX TAKE 1 TABLET BY MOUTH  DAILY   glimepiride 4 MG tablet Commonly known as: AMARYL TAKE 2 TABLETS BY MOUTH  DAILY WITH BREAKFAST   losartan 50 MG tablet Commonly known as: COZAAR Take 1 tablet (50 mg total) by mouth daily.   metFORMIN 1000 MG  tablet Commonly known as: GLUCOPHAGE TAKE 1 TABLET BY MOUTH  TWICE DAILY WITH MEALS   pantoprazole 40 MG tablet Commonly known as: PROTONIX Take 1 tablet (40 mg total) by mouth daily.   prednisoLONE acetate 1 % ophthalmic suspension Commonly known as: PRED FORTE INSTILL 1 DROP INTO THE LEFT EYE 4 TIMES DAILY. START 2 DAYS PRIOR TO SURGERY. What changed:  how much to take how to take this when to take this   Suprep Bowel Prep Kit 17.5-3.13-1.6 GM/177ML Soln Generic drug: Na Sulfate-K Sulfate-Mg Sulf use as directed   Trulicity  0.75 MG/0.5ML Sopn Generic drug: Dulaglutide INJECT SUBCUTANEOUSLY 0.75  MG ONCE WEEKLY What changed: See the new instructions.       No Known Allergies    The results of significant diagnostics from this hospitalization (including imaging, microbiology, ancillary and laboratory) are listed below for reference.    Significant Diagnostic Studies: DG Chest 2 View  Result Date: 11/07/2020 CLINICAL DATA:  Chest pain EXAM: CHEST - 2 VIEW COMPARISON:  08/05/2017 FINDINGS: No consolidation, features of edema, pneumothorax, or effusion. Pulmonary vascularity is normally distributed. The cardiomediastinal contours are unremarkable. No acute osseous or soft tissue abnormality. IMPRESSION: No acute cardiopulmonary abnormality. Electronically Signed   By: Lovena Le M.D.   On: 11/07/2020 22:47   CT CORONARY MORPH W/CTA COR W/SCORE W/CA W/CM &/OR WO/CM  Result Date: 11/08/2020 CLINICAL DATA:  65 yo male with chest pain EXAM: Cardiac/Coronary CTA TECHNIQUE: A non-contrast, gated CT scan was obtained with axial slices of 3 mm through the heart for calcium scoring. Calcium scoring was performed using the Agatston method. A 120 kV prospective, gated, contrast cardiac scan was obtained. Gantry rotation speed was 250 msecs and collimation was 0.6 mm. Two sublingual nitroglycerin tablets (0.8 mg) were given. The 3D data set was reconstructed in 5% intervals of the 35-75%  of the R-R cycle. Diastolic phases were analyzed on a dedicated workstation using MPR, MIP, and VRT modes. The patient received 95 cc of contrast. FINDINGS: Image quality: Average Coronary Arteries:  Normal coronary origin.  Right dominance. Left main: The left main is a large caliber vessel with a normal take off from the left coronary cusp that trifurcates into a LAD, LCX, and ramus intermedius. There is minimal (0-24) calcified plaque. Left anterior descending artery: The LAD has a mild (25-49) soft plaque stenosis in the proximal vessel. Ramus intermedius: Large branching vessel; patent with no evidence of plaque or stenosis. Left circumflex artery: The LCX is non-dominant; there is artifact in the mid vessel; minimal (0-24) calcified plaque follows. The LCX gives off 1 proximal patent obtuse marginal branch. Right coronary artery: The RCA is dominant with normal take off from the right coronary cusp. There are multiple minimal (0-24) calcified stenoses in the distal RCA. The RCA terminates as a PDA (minimal calcified stenosis in the mid vessel) and right posterolateral branch. Right Atrium: Right atrial size is within normal limits. Right Ventricle: The right ventricular cavity is within normal limits. Left Atrium: Left atrial size is normal in size with no left atrial appendage filling defect. Left Ventricle: The ventricular cavity size is within normal limits. There are no stigmata of prior infarction. There is no abnormal filling defect. Pulmonary arteries: Mildly dilated without proximal filling defect. Pulmonary veins: Normal pulmonary venous drainage. Pericardium: Normal thickness with no significant effusion or calcium present. Cardiac valves: The aortic valve is trileaflet without significant calcification. The mitral valve is normal structure without significant calcification. Aorta: Mildly dilated aortic root at sinuses (40 mm); aortic atherosclerosis noted. Extra-cardiac findings: See attached  radiology report for non-cardiac structures. IMPRESSION: 1. Coronary calcium score of 170. This was 62 percentile for age-, sex, and race-matched controls. 2. Normal coronary origin with right dominance. 3. Mild (45-49) soft tissue stenosis in the proximal LAD; otherwise minimal plaque as outlined above. 4. Milldy dilated aortic root at sinuses (40 mm); aortic atherosclerosis noted. 5. Mildly dilated pulmonary artery. RECOMMENDATIONS: CAD-RADS 2: Mild non-obstructive CAD (25-49%). Consider non-atherosclerotic causes of chest pain. Consider preventive therapy and risk factor modification. Kirk Ruths, MD Electronically  Signed   By: Kirk Ruths M.D.   On: 11/08/2020 16:34    Microbiology: Recent Results (from the past 240 hour(s))  Resp Panel by RT-PCR (Flu A&B, Covid) Nasopharyngeal Swab     Status: None   Collection Time: 11/08/20 12:40 AM   Specimen: Nasopharyngeal Swab; Nasopharyngeal(NP) swabs in vial transport medium  Result Value Ref Range Status   SARS Coronavirus 2 by RT PCR NEGATIVE NEGATIVE Final    Comment: (NOTE) SARS-CoV-2 target nucleic acids are NOT DETECTED.  The SARS-CoV-2 RNA is generally detectable in upper respiratory specimens during the acute phase of infection. The lowest concentration of SARS-CoV-2 viral copies this assay can detect is 138 copies/mL. A negative result does not preclude SARS-Cov-2 infection and should not be used as the sole basis for treatment or other patient management decisions. A negative result may occur with  improper specimen collection/handling, submission of specimen other than nasopharyngeal swab, presence of viral mutation(s) within the areas targeted by this assay, and inadequate number of viral copies(<138 copies/mL). A negative result must be combined with clinical observations, patient history, and epidemiological information. The expected result is Negative.  Fact Sheet for Patients:   EntrepreneurPulse.com.au  Fact Sheet for Healthcare Providers:  IncredibleEmployment.be  This test is no t yet approved or cleared by the [place] FDA and  has been authorized for detection and/or diagnosis of SARS-CoV-2 by FDA under an Emergency Use Authorization (EUA). This EUA will remain  in effect (meaning this test can be used) for the duration of the COVID-19 declaration under Section 564(b)(1) of the Act, 21 U.S.C.section 360bbb-3(b)(1), unless the authorization is terminated  or revoked sooner.       Influenza A by PCR NEGATIVE NEGATIVE Final   Influenza B by PCR NEGATIVE NEGATIVE Final    Comment: (NOTE) The Xpert Xpress SARS-CoV-2/FLU/RSV plus assay is intended as an aid in the diagnosis of influenza from Nasopharyngeal swab specimens and should not be used as a sole basis for treatment. Nasal washings and aspirates are unacceptable for Xpert Xpress SARS-CoV-2/FLU/RSV testing.  Fact Sheet for Patients: EntrepreneurPulse.com.au  Fact Sheet for Healthcare Providers: IncredibleEmployment.be  This test is not yet approved or cleared by the [place] FDA and has been authorized for detection and/or diagnosis of SARS-CoV-2 by FDA under an Emergency Use Authorization (EUA). This EUA will remain in effect (meaning this test can be used) for the duration of the COVID-19 declaration under Section 564(b)(1) of the Act, 21 U.S.C. section 360bbb-3(b)(1), unless the authorization is terminated or revoked.  Performed at [facility], [address] [place], [place] [address]      Labs: Basic Metabolic Panel: Recent Labs  Lab 11/07/20 2235 11/08/20 0712  NA 141 138  K 3.9 3.4*  CL 104 102  CO2 27 23  GLUCOSE 80 72  BUN 15 12  CREATININE 0.85 0.67  CALCIUM 9.3 8.6*  MG  --  2.1  PHOS  --  3.9   Liver Function Tests: No results for input(s): AST, ALT, ALKPHOS,  BILITOT, PROT, ALBUMIN in the last 168 hours. No results for input(s): LIPASE, AMYLASE in the last 168 hours. No results for input(s): AMMONIA in the last 168 hours. CBC: Recent Labs  Lab 11/07/20 2235 11/08/20 0712  WBC 5.6 5.2  HGB 11.6* 11.3*  HCT 37.5* 35.5*  MCV 79.6* 78.9*  PLT 285 275   Cardiac Enzymes: No results for input(s): CKTOTAL, CKMB, CKMBINDEX, TROPONINI in the last 168 hours. BNP: BNP (last 3  results) No results for input(s): BNP in the last 8760 hours.  ProBNP (last 3 results) No results for input(s): PROBNP in the last 8760 hours.  CBG: Recent Labs  Lab 11/08/20 0749 11/08/20 1122 11/08/20 1627  GLUCAP 91 94 65*       Signed:  Karmen Bongo, MD [facility] 11/08/2020, 5:52 PM

## 2020-11-08 NOTE — ED Notes (Signed)
Report given to Marcelyn Ditty RN

## 2020-11-08 NOTE — ED Notes (Signed)
Called 6E for report but RN told she would call back

## 2020-11-08 NOTE — Progress Notes (Signed)
Pt states he has had 4 covid vaccines and does not need the one order. Carroll Kinds RN

## 2020-11-09 ENCOUNTER — Other Ambulatory Visit: Payer: Self-pay

## 2020-11-09 ENCOUNTER — Telehealth: Payer: Self-pay

## 2020-11-09 NOTE — Telephone Encounter (Signed)
Transition Care Management Follow-up Telephone Call Date of discharge and from where: 11/08/2020, [facility] How have you been since you were released from the hospital? He said he still has some "things."  He explained that he continues to have chest and upper back pain but it is not as bad as when he was in the hospital.  He said in the hospital the pain was 7.5-8/10 and crushing and it is now 2.5-3/10.and not as bad as severe.  He is currently at [place] up his pantoprazole and he hopes that will help. He understands when he should return to ED for chest pain.  Any questions or concerns? No  Items Reviewed: Did the pt receive and understand the discharge instructions provided? Yes  Medications obtained and verified? Yes - he now has all of his medications and did not have any questions about his med regime. He has a glucometer.  Other? No  Any new allergies since your discharge? No  Dietary orders reviewed? No Do you have support at home? Yes   Home Care and Equipment/Supplies: Were home health services ordered? no If so, what is the name of the agency? N/a  Has the agency set up a time to come to the patient's home? not applicable Were any new equipment or medical supplies ordered?  No What is the name of the medical supply agency? N/a Were you able to get the supplies/equipment? not applicable Do you have any questions related to the use of the equipment or supplies? No  Functional Questionnaire: (I = Independent and D = Dependent) ADLs: independent   Follow up appointments reviewed:  PCP Hospital f/u appt confirmed? Yes  Scheduled to see [name], NP on 11/28/2020.  [facility] f/u appt confirmed? Yes  Scheduled to see Neurology- 11/21/2020; cardiology- 11/30/2020.  Are transportation arrangements needed? No  If their condition worsens, is the pt aware to call PCP or go to the Emergency Dept.? Yes Was the patient provided with contact information  for the PCP's office or ED? Yes Was to pt encouraged to call back with questions or concerns? Yes

## 2020-11-16 ENCOUNTER — Ambulatory Visit: Payer: Medicare Other | Admitting: Nurse Practitioner

## 2020-11-17 ENCOUNTER — Telehealth: Payer: Self-pay | Admitting: Gastroenterology

## 2020-11-17 NOTE — Telephone Encounter (Signed)
This patient was seen 2 months ago for longstanding microcytic anemia, and has an EGD and colonoscopy scheduled with me in the [place] on 11/23/2020.  Osvaldo Angst of anesthesia reviewed his chart and noted the patient was recently admitted to the hospital for chest pain.  Although the patient ruled out for an acute cardiac cause, he was advised to follow-up with cardiology. Our anesthesia provider is recommending this patient's procedure be postponed until after cardiology evaluation and clearance.  Please contact Jon [name] and let him know that.  Cancel the EGD and colonoscopy for July 13 and find out when his cardiology office appointment is scheduled.  Then we can be on the look out for that office note, communicate directly with that provider and make a decision on timing of endoscopic procedures.  Naturally, that means he should continue to take his Plavix.  - HD

## 2020-11-17 NOTE — Telephone Encounter (Signed)
I have spoken to patient to advise of the recommendation by our anesthesia team/Dr Danis to hold off on planned endoscopy/colonoscopy for 11/23/20 until after complete cardiac evaluation and follow up are completed following his hospital visit for chest pain. Patient is advised that we will look at his chart following his 11/30/20 with Terie Purser, NP with cardiology and will go forward with procedure if he is cleared at that time. Patient verbalizes understanding of this information and verbalizes understanding that he should continue Plavix at this time. Procedures cancelled at this time.

## 2020-11-21 ENCOUNTER — Encounter: Payer: Self-pay | Admitting: Adult Health

## 2020-11-21 ENCOUNTER — Other Ambulatory Visit: Payer: Self-pay

## 2020-11-21 ENCOUNTER — Ambulatory Visit: Payer: Medicare Other | Admitting: Adult Health

## 2020-11-21 VITALS — BP 133/79 | HR 87 | Ht 70.0 in | Wt 186.0 lb

## 2020-11-21 DIAGNOSIS — E785 Hyperlipidemia, unspecified: Secondary | ICD-10-CM | POA: Diagnosis not present

## 2020-11-21 DIAGNOSIS — I639 Cerebral infarction, unspecified: Secondary | ICD-10-CM

## 2020-11-21 DIAGNOSIS — Z794 Long term (current) use of insulin: Secondary | ICD-10-CM

## 2020-11-21 DIAGNOSIS — I6381 Other cerebral infarction due to occlusion or stenosis of small artery: Secondary | ICD-10-CM

## 2020-11-21 DIAGNOSIS — I10 Essential (primary) hypertension: Secondary | ICD-10-CM

## 2020-11-21 DIAGNOSIS — E1165 Type 2 diabetes mellitus with hyperglycemia: Secondary | ICD-10-CM

## 2020-11-21 NOTE — Progress Notes (Signed)
[facility] [address] [place]. [place] [address] [phone]       STROKE FOLLOW UP NOTE  [name] Date of Birth:  [birth year] Medical Record Number:  [id number]   Reason for Referral: stroke follow up    SUBJECTIVE:   CHIEF COMPLAINT:  Chief Complaint  Patient presents with   Follow-up    RM 3 alone Pt is well and stable, no complaints      HPI:   Today, 11/21/2020, [name] returns for 67-month stroke follow-up.  Stable from stroke standpoint without new stroke/TIA symptoms and reports residual right hand finger tip numbness which has been stable. Occasional balance difficulties. Compliant on Plavix and atorvastatin without associated side effects.  Blood pressure today 133/79. Glucose levels 3 month average 98 - recent A1c 7.1.  He remains active and continues ADLs and IADLs independently.  He plans on traveling abroad for 4-6 months this fall to visit his family.  No new concerns at this time.     History provided for reference purposes only Update 05/23/2020 JM: [name] returns for stroke follow up accompanied. Reports residual right hand fingertip numbness and occasional imbalance which has been stable without worsening.  Denies new stroke/TIA symptoms.  Prior complaint of altered awareness episode with EEG negative and no further episodes since that time. Remains on plavix and atorvastatin 80mg  daily without side effects. Blood pressure today 124/66. Monitors at home and typically stable.  No concerns at this time.  Update 01/19/2020 JM: [name] returns today for stroke follow-up Reports residual deficits of right hand fingertip numbness and decreased right hand dexterity He does report episode approximately 2 weeks ago while he was driving home from work.  He reports he was coming into an intersection where he knew he had to stop but had difficulty moving his foot off the accelerator onto the brake.  He reports loss of consciousness but  also reports being aware of what was going on around him.  Denies actual passing out.  After further questioning, reports possible binocular visual loss (blacked out vision).  The symptoms lasted for approximately 2 to 3 seconds and then resolved returning completely back to baseline and able to drive himself home.  Denies weakness, numbness/tingling, confusion (prior, during or after), speech difficulty, headache, dizziness or lightheadedness, diplopia, blurred vision, nausea, diaphoresis, palpitations or shortness of breath.  Denies increased stress or fatigue at that time.  Reports checking glucose levels once he returned home which was satisfactory.  He did not check blood pressure.  He denies experiencing episode previously or since that time.  He did not seek emergency evaluation at that time.  Remains on clopidogrel and atorvastatin 80 mg daily without side effects Lipid panel 01/11/2020 showed LDL 72 Blood pressure today 146/80.  Monitors at home and typically stable. Glucose levels have been stable per patient with average 106.  Recent A1c 7.4 (01/11/2020), down from prior 7.8.  Managed by PCP. No further concerns at this time  Update 10/21/2019 JM: [name] is being seen for stroke follow-up.  He has been doing well since discharge with improvement of deficits with only mild right finger tip numbness, slightly decreased dexterity and mild right facial droop.  Denies new or worsening stroke/TIA symptoms. He has returned back to all prior activities without difficulty including working. Completed 3 weeks DAPT and continues on Plavix alone without bleeding or bruising.  Continues on atorvastatin 80 mg daily without myalgias.  Blood pressure today 156/87.  Glucose  levels stable. Continues to follow with PCP for DM, HTN and HLD management.  No concerns at this time.  Stroke admission 09/17/2019 [name] is a 65 y.o. male with history of stroke involving the right side with no residual deficits  in 2018, diabetes, hypertension, hyperlipidemia  who presented on 09/17/2019 with Right-sided weakness and numbness.  Stroke work-up revealed left chronic infarct secondary to small vessel disease source.  Previously on clopidogrel and recommended DAPT for 3 weeks and Plavix alone.  Prior stroke history in 04/2017 lacunar infarct posterior limb left internal capsule/lateral thalamus secondary to small vessel disease without residual deficits.  History of HTN stable.  History of HLD previously on atorvastatin 40 mg daily with LDL 99 and increase atorvastatin dosage to 80 mg daily.  Uncontrolled DM with A1c 7.8 recommend close PCP follow-up.  Stroke risk factors include advanced age, EtOH use, and family history of stroke.  Other active problems include normocytic normochromic anemia.  Residual deficits of mild decreased right hand dexterity with therapy recommendation of outpatient OT and discharged home in stable condition.  Stroke:   L thalamic infarct secondary to small vessel disease source CT head No acute abnormality. Old L caudate, L thalamic, L lentiform nucleus, infarct. Small vessel disease.  MRI  Small L thalamic infarct. Small vessel disease. Atrophy. Old same location infarcts. CTA head & neck no LVO. No sign stenosis  2D Echo EF 60-65%. No source of embolus  LDL 99 HgbA1c 7.8 Lovenox 40 mg sq daily for VTE prophylaxis clopidogrel 75 mg daily prior to admission, now on aspirin 81 mg daily and clopidogrel 75 mg daily. Continue DAPT x 3 weeks then plavix alone.   Therapy recommendations:  outpt OT Disposition:  Return home      ROS:   14 system review of systems performed and negative with exception of see HPI  PMH:  Past Medical History:  Diagnosis Date   Carotid artery occlusion    Diabetes mellitus without complication ([place])    type II   History of kidney stones    Hyperlipidemia    Hypertension    Kidney stones    Stomach ulcer    Stroke [facility])    Dec. 14 2018    PSH:   Past Surgical History:  Procedure Laterality Date   CYSTOSCOPY Left 12/10/2016   Procedure: cystoscopy with left ureteral stone extraction ;  Surgeon: Kathie Rhodes, MD;  Location: [facility];  Service: Urology;  Laterality: Left;   HERNIA REPAIR     Stomach ulcer      Social History:  Social History   Socioeconomic History   Marital status: Divorced    Spouse name: Not on file   Number of children: 4   Years of education: 16   Highest education level: Not on file  Occupational History   Occupation: [employer] driver  Tobacco Use   Smoking status: Never   Smokeless tobacco: Never  Vaping Use   Vaping Use: Never used  Substance and Sexual Activity   Alcohol use: Yes    Alcohol/week: 1.0 - 2.0 standard drink    Types: 1 - 2 Glasses of wine per week    Comment: socially   Drug use: No   Sexual activity: Not Currently  Other Topics Concern   Not on file  Social History Narrative   Fun: Politics   Social Determinants of Health   Financial Resource Strain: Not on file  Food Insecurity: Not on file  Transportation Needs: Not  on file  Physical Activity: Not on file  Stress: Not on file  Social Connections: Not on file  Intimate Partner Violence: Not on file    Family History:  Family History  Problem Relation Age of Onset   Diabetes Father    Stroke Father    Hypertension Father    Hypertension Mother    Diabetes Sister    Diabetes Son    Diabetes Daughter     Medications:   Current Outpatient Medications on File Prior to Visit  Medication Sig Dispense Refill   amLODipine (NORVASC) 10 MG tablet TAKE 1 TABLET BY MOUTH  DAILY 90 tablet 3   atorvastatin (LIPITOR) 80 MG tablet Take 1 tablet (80 mg total) by mouth daily. 90 tablet 2   clopidogrel (PLAVIX) 75 MG tablet TAKE 1 TABLET BY MOUTH  DAILY 90 tablet 0   glimepiride (AMARYL) 4 MG tablet TAKE 2 TABLETS BY MOUTH  DAILY WITH BREAKFAST 60 tablet 4   glucose blood (ONETOUCH VERIO) test strip USE AS INSTRUCTED TO CHECK  BLOOD SUGAR DAILY. (Patient taking differently: USE AS INSTRUCTED TO CHECK BLOOD SUGAR DAILY.) 50 strip 3   losartan (COZAAR) 50 MG tablet Take 1 tablet (50 mg total) by mouth daily. 90 tablet 3   metFORMIN (GLUCOPHAGE) 1000 MG tablet TAKE 1 TABLET BY MOUTH  TWICE DAILY WITH MEALS 180 tablet 0   pantoprazole (PROTONIX) 40 MG tablet Take 1 tablet (40 mg total) by mouth daily. 30 tablet 1   TRULICITY 5.46 FK/8.1EX SOPN INJECT SUBCUTANEOUSLY 0.75  MG ONCE WEEKLY 6 mL 0   No current facility-administered medications on file prior to visit.    Allergies:  No Known Allergies    OBJECTIVE:  Physical Exam  Vitals:   11/21/20 1434  BP: 133/79  Pulse: 87  Weight: 186 lb (84.4 kg)  Height: 5\' 10"  (1.778 m)    Body mass index is 26.69 kg/m. No results found.  General: well developed, well nourished, pleasant middle-aged man, seated, in no evident distress Head: head normocephalic and atraumatic.   Neck: supple with no carotid or supraclavicular bruits Cardiovascular: regular rate and rhythm, no murmurs Musculoskeletal: no deformity Skin:  no rash/petichiae Vascular:  Normal pulses all extremities   Neurologic Exam Mental Status: Awake and fully alert. Fluent speech and language. Oriented to place and time. Recent and remote memory intact. Attention span, concentration and fund of knowledge appropriate. Mood and affect appropriate.  Cranial Nerves: Pupils equal, briskly reactive to light. Extraocular movements full without nystagmus. Visual fields full to confrontation. Hearing intact. Facial sensation intact.  Motor: Normal bulk and tone. Normal strength in all tested extremity muscles. Sensory.: intact to touch , pinprick , position and vibratory sensation.  Coordination: Rapid alternating movements normal in all extremities except slightly diminished right hand dexterity. Finger-to-nose and heel-to-shin performed accurately bilaterally. Gait and Station: Arises from chair without  difficulty. Stance is normal. Gait demonstrates normal stride length and balance without use of assistive device Reflexes: 1+ and symmetric. Toes downgoing.         ASSESSMENT/PLAN: Benjamin Casanas is a 65 y.o. year old male presented with right-sided numbness and weakness on 09/17/2019 with stroke work-up revealing left thalamic infarct secondary to small vessel disease source. Vascular risk factors include prior stroke 04/2017, HTN, HLD and DM.  Episode of transient altered awareness of unknown etiology 12/2019 with work-up unremarkable and no additional events.     L thalamic stroke Recovered well with only minimal residual deficits  Continue clopidogrel 75 mg daily  and atorvastatin 80 mg daily for secondary stroke prevention.   Discussed secondary stroke prevention measures and importance of close PCP follow-up and compliance with all prescribed medications for aggressive stroke risk factor management HTN:  BP goal<130/90.  Well-controlled on losartan 50 mg daily and amlodipine 10 mg daily per PCP HLD:  LDL goal<70.  Most recent LDL 86 11/08/2020. On atorvastatin 80 mg daily per PCP.  Discussed dietary changes and avoiding foods high in cholesterol with additional education provided DMII:  A1c goal<7.0.  Most recent A1c 7.1 (09/2020).  On Metformin 1000 mg twice daily and glimepiride 8 mg daily per PCP.     Overall stable from stroke standpoint and recommend follow-up on an as-needed basis   CC:  [facility] provider: Dr. Augustina Mood, Vernia Buff, NP    I spent 29 minutes of face-to-face and non-face-to-face time with patient.  This included previsit chart review, lab review, study review, electronic health record documentation, patient education regarding history of prior stroke including secondary stroke prevention measures and aggressive stroke risk factor management, residual deficits, and answered all other questions to patient satisfaction  Frann Rider, AGNP-BC  [facility] [address] [place], [place] [address]  Phone [phone] Fax [phone] Note: This document was prepared with digital dictation and possible smart phrase technology. Any transcriptional errors that result from this process are unintentional.

## 2020-11-21 NOTE — Patient Instructions (Signed)
Continue clopidogrel 75 mg daily  and atorvastatin 80mg  daily  for secondary stroke prevention  Continue to follow up with PCP regarding cholesterol, blood pressure and diabetes management  Maintain strict control of hypertension with blood pressure goal below 130/90, diabetes with hemoglobin A1c goal below 7% and cholesterol with LDL cholesterol (bad cholesterol) goal below 70 mg/dL.         Thank you for coming to see [place] at [facility]. I hope we have been able to provide you high quality care today.  You may receive a patient satisfaction survey over the next few weeks. We would appreciate your feedback and comments so that we may continue to improve ourselves and the health of our patients.    Cholesterol Content in Foods Cholesterol is a waxy, fat-like substance that helps to carry fat in the blood. The body needs cholesterol in small amounts, but too much cholesterol can causedamage to the arteries and heart. Most people should eat less than 200 milligrams (mg) of cholesterol a day. Foods with cholesterol  Cholesterol is found in animal-based foods, such as meat, seafood, and dairy. Generally, low-fat dairy and lean meats have less cholesterol than full-fat dairy and fatty meats. The milligrams of cholesterol per serving (mg per serving) of common cholesterol-containing foods are listed below. Meat and other proteins Egg -- one large whole egg has 186 mg. Veal shank -- 4 oz has 141 mg. Lean ground [place] (93% lean) -- 4 oz has 118 mg. Fat-trimmed lamb loin -- 4 oz has 106 mg. Lean ground beef (90% lean) -- 4 oz has 100 mg. Lobster -- 3.5 oz has 90 mg. Pork loin chops -- 4 oz has 86 mg. Canned salmon -- 3.5 oz has 83 mg. Fat-trimmed beef top loin -- 4 oz has 78 mg. Frankfurter -- 1 frank (3.5 oz) has 77 mg. Crab -- 3.5 oz has 71 mg. Roasted chicken without skin, white meat -- 4 oz has 66 mg. Light bologna -- 2 oz has 45 mg. Deli-cut [place] -- 2 oz has 31  mg. Canned tuna -- 3.5 oz has 31 mg. Berniece Salines -- 1 oz has 29 mg. Oysters and mussels (raw) -- 3.5 oz has 25 mg. Mackerel -- 1 oz has 22 mg. Trout -- 1 oz has 20 mg. Pork sausage -- 1 link (1 oz) has 17 mg. Salmon -- 1 oz has 16 mg. Tilapia -- 1 oz has 14 mg. Dairy Soft-serve ice cream --  cup (4 oz) has 103 mg. Whole-milk yogurt -- 1 cup (8 oz) has 29 mg. Cheddar cheese -- 1 oz has 28 mg. American cheese -- 1 oz has 28 mg. Whole milk -- 1 cup (8 oz) has 23 mg. 2% milk -- 1 cup (8 oz) has 18 mg. Cream cheese -- 1 tablespoon (Tbsp) has 15 mg. Cottage cheese --  cup (4 oz) has 14 mg. Low-fat (1%) milk -- 1 cup (8 oz) has 10 mg. Sour cream -- 1 Tbsp has 8.5 mg. Low-fat yogurt -- 1 cup (8 oz) has 8 mg. Nonfat Greek yogurt -- 1 cup (8 oz) has 7 mg. Half-and-half cream -- 1 Tbsp has 5 mg. Fats and oils Cod liver oil -- 1 tablespoon (Tbsp) has 82 mg. Butter -- 1 Tbsp has 15 mg. Lard -- 1 Tbsp has 14 mg. Bacon grease -- 1 Tbsp has 14 mg. Mayonnaise -- 1 Tbsp has 5-10 mg. Margarine -- 1 Tbsp has 3-10 mg. Exact amounts of cholesterol in these foods may  vary depending on specificingredients and brands. Foods without cholesterol Most plant-based foods do not have cholesterol unless you combine them with a food that has cholesterol. Foods without cholesterol include: Grains and cereals. Vegetables. Fruits. Vegetable oils, such as olive, canola, and sunflower oil. Legumes, such as peas, beans, and lentils. Nuts and seeds. Egg whites. Summary The body needs cholesterol in small amounts, but too much cholesterol can cause damage to the arteries and heart. Most people should eat less than 200 milligrams (mg) of cholesterol a day. This information is not intended to replace advice given to you by your health care provider. Make sure you discuss any questions you have with your healthcare provider. Document Revised: 08/11/2019 Document Reviewed: 09/21/2019 Elsevier Patient Education  [place].

## 2020-11-22 NOTE — Progress Notes (Signed)
I agree with the above plan 

## 2020-11-23 ENCOUNTER — Encounter: Payer: Medicare Other | Admitting: Gastroenterology

## 2020-11-27 ENCOUNTER — Other Ambulatory Visit: Payer: Self-pay | Admitting: Nurse Practitioner

## 2020-11-27 DIAGNOSIS — I63332 Cerebral infarction due to thrombosis of left posterior cerebral artery: Secondary | ICD-10-CM

## 2020-11-28 ENCOUNTER — Other Ambulatory Visit: Payer: Self-pay

## 2020-11-28 ENCOUNTER — Encounter: Payer: Self-pay | Admitting: Nurse Practitioner

## 2020-11-28 ENCOUNTER — Ambulatory Visit: Payer: Medicare Other | Attending: Nurse Practitioner | Admitting: Nurse Practitioner

## 2020-11-28 VITALS — BP 133/82 | HR 86 | Resp 16 | Wt 192.0 lb

## 2020-11-28 DIAGNOSIS — Z09 Encounter for follow-up examination after completed treatment for conditions other than malignant neoplasm: Secondary | ICD-10-CM | POA: Diagnosis not present

## 2020-11-28 DIAGNOSIS — I10 Essential (primary) hypertension: Secondary | ICD-10-CM | POA: Diagnosis not present

## 2020-11-28 DIAGNOSIS — E119 Type 2 diabetes mellitus without complications: Secondary | ICD-10-CM | POA: Diagnosis not present

## 2020-11-28 DIAGNOSIS — E876 Hypokalemia: Secondary | ICD-10-CM

## 2020-11-28 LAB — GLUCOSE, POCT (MANUAL RESULT ENTRY): POC Glucose: 169 mg/dl — AB (ref 70–99)

## 2020-11-28 NOTE — Telephone Encounter (Signed)
  Notes to clinic:  patient has appt today    Requested Prescriptions  Pending Prescriptions Disp Refills   clopidogrel (PLAVIX) 75 MG tablet [Pharmacy Med Name: Clopidogrel Bisulfate 75 MG Oral Tablet] 90 tablet 3    Sig: TAKE 1 TABLET BY MOUTH  DAILY      Hematology: Antiplatelets - clopidogrel Failed - 11/27/2020 11:53 PM      Failed - Evaluate AST, ALT within 2 months of therapy initiation.      Failed - HCT in normal range and within 180 days    HCT  Date Value Ref Range Status  11/08/2020 35.5 (L) 39.0 - 52.0 % Final   Hematocrit  Date Value Ref Range Status  09/12/2020 40.4 37.5 - 51.0 % Final          Failed - HGB in normal range and within 180 days    Hemoglobin  Date Value Ref Range Status  11/08/2020 11.3 (L) 13.0 - 17.0 g/dL Final  09/12/2020 12.6 (L) 13.0 - 17.7 g/dL Final          Passed - ALT in normal range and within 360 days    ALT  Date Value Ref Range Status  09/12/2020 15 0 - 44 IU/L Final          Passed - AST in normal range and within 360 days    AST  Date Value Ref Range Status  09/12/2020 14 0 - 40 IU/L Final          Passed - PLT in normal range and within 180 days    Platelets  Date Value Ref Range Status  11/08/2020 275 150 - 400 K/uL Final  09/12/2020 257 150 - 450 x10E3/uL Final          Passed - Valid encounter within last 6 months    Recent Outpatient Visits           Today Type 2 diabetes mellitus without complication, without long-term current use of insulin ([place])   [place], Vernia Buff, NP   3 months ago Primary hypertension   [place], Vernia Buff, NP   6 months ago Primary hypertension   [place], [place] W, NP   9 months ago Type 2 diabetes mellitus without complication, without long-term current use of insulin [facility])   [place], Vernia Buff, NP   10 months ago  Carotid stenosis, left   [place], Vernia Buff, NP       Future Appointments             In 2 days Loel Dubonnet, NP [facility]   In 3 weeks Gildardo Pounds, NP [place]

## 2020-11-28 NOTE — Progress Notes (Signed)
Assessment & Plan:  Jon [name] was seen today for hospitalization follow-up.  Diagnoses and all orders for this visit:  Hospital discharge follow-up Symptoms resolved.  Type 2 diabetes mellitus without complication, without long-term current use of insulin (HCC) -     POCT glucose (manual entry)  Hypokalemia -     Potassium   Patient has been counseled on age-appropriate routine health concerns for screening and prevention. These are reviewed and up-to-date. Referrals have been placed accordingly. Immunizations are up-to-date or declined.    Subjective:   Chief Complaint  Patient presents with   Hospitalization Follow-up   [name] 65 y.o. male presents to office today for HFU He has a past medical history of Carotid artery occlusion, DM2, History of kidney stones, Hyperlipidemia, Hypertension, Stomach ulcer, and Stroke ([place]).   Admitted to the hospital from 6-27 through 6-28 with complaints of chest pain. Cardiac work-up was negative including coronary CTA, EKG and cardiac markers.  He was started on aspirin 81 mg daily and PPI.  Today he states symptoms have resolved and he is currently taking his PPI and aspirin as prescribed. Denies chest pain, shortness of breath, palpitations, lightheadedness, dizziness, headaches or BLE edema.  Blood pressure is well controlled today with Norvasc and Cozaar.  Diabetes is well controlled with last A1c less than 6. BP Readings from Last 3 Encounters:  11/28/20 133/82  11/21/20 133/79  11/08/20 126/78     Review of Systems  Constitutional:  Negative for fever, malaise/fatigue and weight loss.  HENT: Negative.  Negative for nosebleeds.   Eyes: Negative.  Negative for blurred vision, double vision and photophobia.  Respiratory: Negative.  Negative for cough and shortness of breath.   Cardiovascular: Negative.  Negative for chest pain, palpitations and leg swelling.  Gastrointestinal: Negative.  Negative for heartburn, nausea and  vomiting.  Musculoskeletal: Negative.  Negative for myalgias.  Neurological: Negative.  Negative for dizziness, focal weakness, seizures and headaches.  Psychiatric/Behavioral: Negative.  Negative for suicidal ideas.    Past Medical History:  Diagnosis Date   Carotid artery occlusion    Diabetes mellitus without complication ([place])    type II   History of kidney stones    Hyperlipidemia    Hypertension    Kidney stones    Stomach ulcer    Stroke [facility])    Dec. 14 2018    Past Surgical History:  Procedure Laterality Date   CYSTOSCOPY Left 12/10/2016   Procedure: cystoscopy with left ureteral stone extraction ;  Surgeon: Kathie Rhodes, MD;  Location: [facility];  Service: Urology;  Laterality: Left;   HERNIA REPAIR     Stomach ulcer      Family History  Problem Relation Age of Onset   Diabetes Father    Stroke Father    Hypertension Father    Hypertension Mother    Diabetes Sister    Diabetes Son    Diabetes Daughter     Social History Reviewed with no changes to be made today.   Outpatient Medications Prior to Visit  Medication Sig Dispense Refill   amLODipine (NORVASC) 10 MG tablet TAKE 1 TABLET BY MOUTH  DAILY 90 tablet 3   atorvastatin (LIPITOR) 80 MG tablet Take 1 tablet (80 mg total) by mouth daily. 90 tablet 2   clopidogrel (PLAVIX) 75 MG tablet TAKE 1 TABLET BY MOUTH  DAILY 90 tablet 0   glimepiride (AMARYL) 4 MG tablet TAKE 2 TABLETS BY MOUTH  DAILY WITH BREAKFAST 60 tablet 4  glucose blood (ONETOUCH VERIO) test strip USE AS INSTRUCTED TO CHECK BLOOD SUGAR DAILY. (Patient taking differently: USE AS INSTRUCTED TO CHECK BLOOD SUGAR DAILY.) 50 strip 3   losartan (COZAAR) 50 MG tablet Take 1 tablet (50 mg total) by mouth daily. 90 tablet 3   metFORMIN (GLUCOPHAGE) 1000 MG tablet TAKE 1 TABLET BY MOUTH  TWICE DAILY WITH MEALS 180 tablet 0   pantoprazole (PROTONIX) 40 MG tablet Take 1 tablet (40 mg total) by mouth daily. 30 tablet 1   TRULICITY 4.81 EH/6.3JS SOPN INJECT  SUBCUTANEOUSLY 0.75  MG ONCE WEEKLY 6 mL 0   No facility-administered medications prior to visit.    No Known Allergies     Objective:    BP 133/82   Pulse 86   Resp 16   Wt 192 lb (87.1 kg)   SpO2 95%   BMI 27.55 kg/m  Wt Readings from Last 3 Encounters:  11/28/20 192 lb (87.1 kg)  11/21/20 186 lb (84.4 kg)  11/07/20 189 lb (85.7 kg)    Physical Exam Vitals and nursing note reviewed.  Constitutional:      Appearance: He is well-developed.  HENT:     Head: Normocephalic and atraumatic.  Cardiovascular:     Rate and Rhythm: Normal rate and regular rhythm.     Heart sounds: Normal heart sounds. No murmur heard.   No friction rub. No gallop.  Pulmonary:     Effort: Pulmonary effort is normal. No tachypnea or respiratory distress.     Breath sounds: Normal breath sounds. No decreased breath sounds, wheezing, rhonchi or rales.  Chest:     Chest wall: No tenderness.  Abdominal:     General: Bowel sounds are normal.     Palpations: Abdomen is soft.  Musculoskeletal:        General: Normal range of motion.     Cervical back: Normal range of motion.  Skin:    General: Skin is warm and dry.  Neurological:     Mental Status: He is alert and oriented to person, place, and time.     Coordination: Coordination normal.  Psychiatric:        Behavior: Behavior normal. Behavior is cooperative.        Thought Content: Thought content normal.        Judgment: Judgment normal.         Patient has been counseled extensively about nutrition and exercise as well as the importance of adherence with medications and regular follow-up. The patient was given clear instructions to go to ER or return to medical center if symptoms don't improve, worsen or new problems develop. The patient verbalized understanding.   Follow-up: Return for he already has an appt with me in august.   Ray Gervasi W Mirah Nevins, FNP-BC [facility] and [place], [place]    11/28/2020, 11:31 AM

## 2020-11-29 LAB — POTASSIUM: Potassium: 4.6 mmol/L (ref 3.5–5.2)

## 2020-11-30 ENCOUNTER — Other Ambulatory Visit: Payer: Self-pay

## 2020-11-30 ENCOUNTER — Encounter (HOSPITAL_BASED_OUTPATIENT_CLINIC_OR_DEPARTMENT_OTHER): Payer: Self-pay | Admitting: Family

## 2020-11-30 ENCOUNTER — Ambulatory Visit (HOSPITAL_BASED_OUTPATIENT_CLINIC_OR_DEPARTMENT_OTHER): Payer: Medicare Other | Admitting: Family

## 2020-11-30 ENCOUNTER — Other Ambulatory Visit (HOSPITAL_BASED_OUTPATIENT_CLINIC_OR_DEPARTMENT_OTHER): Payer: Self-pay | Admitting: *Deleted

## 2020-11-30 VITALS — BP 122/78 | HR 79 | Ht 70.0 in | Wt 191.0 lb

## 2020-11-30 DIAGNOSIS — Z8673 Personal history of transient ischemic attack (TIA), and cerebral infarction without residual deficits: Secondary | ICD-10-CM | POA: Diagnosis not present

## 2020-11-30 DIAGNOSIS — I2510 Atherosclerotic heart disease of native coronary artery without angina pectoris: Secondary | ICD-10-CM

## 2020-11-30 DIAGNOSIS — I10 Essential (primary) hypertension: Secondary | ICD-10-CM

## 2020-11-30 DIAGNOSIS — E785 Hyperlipidemia, unspecified: Secondary | ICD-10-CM

## 2020-11-30 MED ORDER — EZETIMIBE 10 MG PO TABS
10.0000 mg | ORAL_TABLET | Freq: Every day | ORAL | 3 refills | Status: DC
  Filled 2020-11-30 – 2020-12-09 (×2): qty 90, 90d supply, fill #0
  Filled 2021-03-10: qty 90, 90d supply, fill #1

## 2020-11-30 NOTE — Patient Instructions (Signed)
Medication Instructions:  Your physician has recommended you make the following change in your medication:   START Zetia one 10mg  tablet daily  This to help get your LDL (bad cholesterol) to goal of less than 70. This helps prevent plaque build up and reduce your risk of heart attack or stroke.   *If you need a refill on your cardiac medications before your next appointment, please call your pharmacy*   Lab Work: Your physician recommends that you return for lab work in: 8 weeks for fasting lipid panel and CMP   If you have labs (blood work) drawn today and your tests are completely normal, you will receive your results only by: [place] (if you have MyChart) OR A paper copy in the mail If you have any lab test that is abnormal or we need to change your treatment, we will call you to review the results.   Testing/Procedures: Your EKG today showed normal sinus rhythm which is a great restul.    Follow-Up: At [facility], you and your health needs are our priority.  As part of our continuing mission to provide you with exceptional heart care, we have created designated Provider Care Teams.  These Care Teams include your primary Cardiologist (physician) and Advanced Practice Providers (APPs -  Physician Assistants and Nurse Practitioners) who all work together to provide you with the care you need, when you need it.  We recommend signing up for the patient portal called "MyChart".  Sign up information is provided on this After Visit Summary.  MyChart is used to connect with patients for Virtual Visits (Telemedicine).  Patients are able to view lab/test results, encounter notes, upcoming appointments, etc.  Non-urgent messages can be sent to your provider as well.   To learn more about what you can do with MyChart, go to [email or web address].    Your next appointment:   4 month(s)  The format for your next appointment:   In Person  Provider:   Skeet Latch,  MD   Other Instructions  Heart Healthy Diet Recommendations: A low-salt diet is recommended. Meats should be grilled, baked, or boiled. Avoid fried foods. Focus on lean protein sources like fish or chicken with vegetables and fruits. The American Heart Association is a Microbiologist!  American Heart Association Diet and Lifeystyle Recommendations   Exercise recommendations: The American Heart Association recommends 150 minutes of moderate intensity exercise weekly. Try 30 minutes of moderate intensity exercise 4-5 times per week. This could include walking, jogging, or swimming.    Coronary Artery Disease, Male Coronary artery disease (CAD) is a condition in which the arteries that lead to the heart (coronary arteries) become narrow or blocked. The narrowing or blockage can lead to decreased blood flow to the heart. Prolonged reduced blood flow can cause a heart attack (myocardial infarction or MI). This condition may also be called coronary heart disease. Because CAD is the leading cause of death in men, it is important to [place] causes this condition and how it is treated. What are the causes? CAD is most often caused by atherosclerosis. This is the buildup of fat and cholesterol (plaque) on the inside of the arteries. Over time, the plaque may narrow or block the artery, reducing blood flow to the heart. Plaque can also become weak and break off within a coronary artery and cause a sudden blockage. Other less common causes of CAD include: A blood clot or a piece of a blood clot or other substance that blocks the  flow of blood in a coronary artery (embolism). A tearing of the artery (spontaneous coronary artery dissection). An enlargement of an artery (aneurysm). Inflammation (vasculitis) in the artery wall. What increases the risk? The following factors may make you more likely to develop this condition: Age. Men over age 27 are at a greater risk of CAD. Family history of  CAD. Gender. Men often develop CAD earlier in life than women. High blood pressure (hypertension). Diabetes. High cholesterol levels. Tobacco use. Excessive alcohol use. Lack of exercise. A diet high in saturated and trans fats, such as fried food and processed meat. Other possible risk factors include: High stress levels. Depression. Obesity. Sleep apnea. What are the signs or symptoms? Many people do not have any symptoms during the early stages of CAD. As the condition progresses, symptoms may include: Chest pain (angina). The pain can: Feel like crushing or squeezing, or like a tightness, pressure, fullness, or heaviness in the chest. Last more than a few minutes or can stop and recur. The pain tends to get worse with exercise or stress and to fade with rest. Pain in the arms, neck, jaw, ear, or back. Unexplained heartburn or indigestion. Shortness of breath. Nausea or vomiting. Sudden light-headedness. Sudden cold sweats. Fluttering or fast heartbeat (palpitations). How is this diagnosed? This condition is diagnosed based on: Your family and medical history. A physical exam. Tests, including: A test to check the electrical signals in your heart (electrocardiogram). Exercise stress test. This looks for signs of blockage when the heart is stressed with exercise, such as running on a treadmill. Pharmacologic stress test. This test looks for signs of blockage when the heart is being stressed with a medicine. Blood tests. Coronary angiogram. This is a procedure to look at the coronary arteries to see if there is any blockage. During this test, a dye is injected into your arteries so they appear on an X-ray. Coronary artery CT scan. This CT scan helps detect calcium deposits in your coronary arteries. Calcium deposits are an indicator of CAD. A test that uses sound waves to take a picture of your heart (echocardiogram). Chest X-ray. How is this treated? This condition may be  treated by: Healthy lifestyle changes to reduce risk factors. Medicines such as: Antiplatelet medicines and blood-thinning medicines, such as aspirin. These help to prevent blood clots. Nitroglycerin. Blood pressure medicines. Cholesterol-lowering medicine. Coronary angioplasty and stenting. During this procedure, a thin, flexible tube is inserted through a blood vessel and into a blocked artery. A balloon or similar device on the end of the tube is inflated to open up the artery. In some cases, a small, mesh tube (stent) is inserted into the artery to keep it open. Coronary artery bypass surgery. During this surgery, veins or arteries from other parts of the body are used to create a bypass around the blockage and allow blood to reach your heart. Follow these instructions at home: Medicines Take over-the-counter and prescription medicines only as told by your health care provider. Do not take the following medicines unless your health care provider approves: NSAIDs, such as ibuprofen, naproxen, or celecoxib. Vitamin supplements that contain vitamin A, vitamin E, or both. Lifestyle Follow an exercise program approved by your health care provider. Aim for 150 minutes of moderate exercise or 75 minutes of vigorous exercise each week. Maintain a healthy weight or lose weight as approved by your health care provider. Learn to manage stress or try to limit your stress. Ask your health care provider for suggestions  if you need help. Get screened for depression and seek treatment, if needed. Do not use any products that contain nicotine or tobacco, such as cigarettes, e-cigarettes, and chewing tobacco. If you need help quitting, ask your health care provider. Do not use illegal drugs. Eating and drinking  Follow a heart-healthy diet. A dietitian can help educate you about healthy food options and changes. In general, eat plenty of fruits and vegetables, lean meats, and whole grains. Avoid foods  high in: Sugar. Salt (sodium). Saturated fat, such as processed or fatty meat. Trans fat, such as fried foods. Use healthy cooking methods such as roasting, grilling, broiling, baking, poaching, steaming, or stir-frying. Do not drink alcohol if your health care provider tells you not to drink. If you drink alcohol: Limit how much you have to 0-2 drinks per day. Be aware of how much alcohol is in your drink. In the [place] one drink equals one 12 oz bottle of beer (355 mL), one 5 oz glass of wine (148 mL), or one 1 oz glass of hard liquor (44 mL).  General instructions Manage any other health conditions, such as hypertension and diabetes. These conditions affect your heart. Your health care provider may ask you to monitor your blood pressure. Ideally, your blood pressure should be below 130/80. Keep all follow-up visits as told by your health care provider. This is important. Get help right away if: You have pain in your chest, neck, ear, arm, jaw, stomach, or back that: Lasts more than a few minutes. Is recurring. Is not relieved by taking medicine under your tongue (sublingual nitroglycerin). You have profuse sweating without cause. You have unexplained: Heartburn or indigestion. Shortness of breath or difficulty breathing. Fluttering or fast heartbeat (palpitations). Nausea or vomiting. Fatigue. Feelings of nervousness or anxiety. Weakness. Diarrhea. You have sudden light-headedness or dizziness. You faint. You feel like hurting yourself or think about taking your own life. These symptoms may represent a serious problem that is an emergency. Do not wait to see if the symptoms will go away. Get medical help right away. Call your local emergency services (911 in the [place] Do not drive yourself to the hospital. Summary Coronary artery disease (CAD) is a condition in which the arteries that lead to the heart (coronary arteries) become narrow or blocked. The narrowing or blockage can  lead to a heart attack. Many people do not have any symptoms during the early stages of CAD. CAD can be treated with lifestyle changes, medicines, surgery, or a combination of these treatments. This information is not intended to replace advice given to you by your health care provider. Make sure you discuss any questions you have with your healthcare provider. Document Revised: 01/17/2018 Document Reviewed: 01/07/2018 Elsevier Patient Education  2022 [employer].

## 2020-11-30 NOTE — Progress Notes (Signed)
Office Visit    Patient Name: Jon [name] Date of Encounter: 11/30/2020  PCP:  Gildardo Pounds, NP   [place]  Cardiologist:  Skeet Latch, MD  Advanced Practice Provider:  No care team member to display Electrophysiologist:  None    Chief Complain    Jon [name] is a 65 y.o. male with a hx of nonobstructive CAD, Dm2, HTN, HLD, CVA (2018), carotid stenosis presents today for hospital follow up   Past Medical History    Past Medical History:  Diagnosis Date   Carotid artery occlusion    Diabetes mellitus without complication ([place])    type II   History of kidney stones    Hyperlipidemia    Hypertension    Kidney stones    Stomach ulcer    Stroke [facility])    Dec. 14 2018   Past Surgical History:  Procedure Laterality Date   CYSTOSCOPY Left 12/10/2016   Procedure: cystoscopy with left ureteral stone extraction ;  Surgeon: Kathie Rhodes, MD;  Location: [facility];  Service: Urology;  Laterality: Left;   HERNIA REPAIR     Stomach ulcer      Allergies  No Known Allergies  History of Present Illness    Jon [name] is a 65 y.o. male with a hx of nonobstructive CAD, Dm2, HTN, HLD, CVA (2018), carotid stenosis last seen while hospitalized.  Hospitalized 11/07/20 due to chest pain. Coronary CTA calcium score 170 placing him 80th percentile for age/sex matched control. He had mild 45-59% soft tissue stenosis in prox LAD and otherwise minimal plaque showing nonobstructive CAD. He also noted RUE numbness and difficulty with ambulation. MRI brain showed left thalamic infarct. Neurology consulted and recommended Aspirin and Plavix for 3 weeks then Plavix alone.   He presents today for follow-up.  We reviewed hospital testing and records in detail. Reports no shortness of breath nor dyspnea on exertion. Reports no chest pain, pressure, or tightness. No edema, orthopnea, PND. Reports no palpitations.  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  No  formal exercise routine but does have a [employer] membership.  Encouraged to start walking regimen.  We discussed a heart healthy diet. Long discussion regarding secondary prevention of heart disease.  EKGs/Labs/Other Studies Reviewed:   The following studies were reviewed today:  Cardiac CTA 11/08/20 IMPRESSION: 1. Coronary calcium score of 170. This was 66 percentile for age-, sex, and race-matched controls.   2. Normal coronary origin with right dominance.   3. Mild (45-49) soft tissue stenosis in the proximal LAD; otherwise minimal plaque as outlined above.   4. Milldy dilated aortic root at sinuses (40 mm); aortic atherosclerosis noted.   5. Mildly dilated pulmonary artery.   RECOMMENDATIONS: CAD-RADS 2: Mild non-obstructive CAD (25-49%). Consider non-atherosclerotic causes of chest pain. Consider preventive therapy and risk factor modification.   Echo 09/18/19 1. Left ventricular ejection fraction, by estimation, is 60 to 65%. The  left ventricle has normal function. The left ventricle has no regional  wall motion abnormalities. There is moderate concentric left ventricular  hypertrophy. Left ventricular  diastolic parameters were normal.   2. Right ventricular systolic function is normal. The right ventricular  size is normal. There is normal pulmonary artery systolic pressure.   3. The mitral valve is normal in structure. Trivial mitral valve  regurgitation. No evidence of mitral stenosis.   4. The aortic valve is tricuspid. Aortic valve regurgitation is not  visualized. No aortic stenosis is present.  5. The inferior vena cava is normal in size with greater than 50%  respiratory variability, suggesting right atrial pressure of 3 mmHg.   Conclusion(s)/Recommendation(s): No intracardiac source of embolism  detected on this transthoracic study. A transesophageal echocardiogram is  recommended to exclude cardiac source of embolism if clinically indicated.   EKG:  EKG is   ordered today.  The ekg ordered today demonstrates NSR 79 bpm with no acute ST/T wave changes.  Recent Labs: 09/12/2020: ALT 15 11/08/2020: BUN 12; Creatinine, Ser 0.67; Hemoglobin 11.3; Magnesium 2.1; Platelets 275; Sodium 138; TSH 2.105 11/28/2020: Potassium 4.6  Recent Lipid Panel    Component Value Date/Time   CHOL 125 11/08/2020 1214   CHOL 137 06/13/2020 1346   TRIG 42 11/08/2020 1214   HDL 31 (L) 11/08/2020 1214   HDL 34 (L) 06/13/2020 1346   CHOLHDL 4.0 11/08/2020 1214   VLDL 8 11/08/2020 1214   LDLCALC 86 11/08/2020 1214   LDLCALC 79 06/13/2020 1346    Home Medications   Current Meds  Medication Sig   amLODipine (NORVASC) 10 MG tablet TAKE 1 TABLET BY MOUTH  DAILY   atorvastatin (LIPITOR) 80 MG tablet Take 1 tablet (80 mg total) by mouth daily.   clopidogrel (PLAVIX) 75 MG tablet TAKE 1 TABLET BY MOUTH  DAILY   ezetimibe (ZETIA) 10 MG tablet Take 1 tablet (10 mg total) by mouth daily.   glimepiride (AMARYL) 4 MG tablet TAKE 2 TABLETS BY MOUTH  DAILY WITH BREAKFAST   glucose blood (ONETOUCH VERIO) test strip USE AS INSTRUCTED TO CHECK BLOOD SUGAR DAILY. (Patient taking differently: USE AS INSTRUCTED TO CHECK BLOOD SUGAR DAILY.)   losartan (COZAAR) 50 MG tablet Take 1 tablet (50 mg total) by mouth daily.   metFORMIN (GLUCOPHAGE) 1000 MG tablet TAKE 1 TABLET BY MOUTH  TWICE DAILY WITH MEALS   pantoprazole (PROTONIX) 40 MG tablet Take 1 tablet (40 mg total) by mouth daily.   TRULICITY 4.58 KD/9.8PJ SOPN INJECT SUBCUTANEOUSLY 0.75  MG ONCE WEEKLY     Review of Systems      All other systems reviewed and are otherwise negative except as noted above.  Physical Exam    VS:  BP 122/78   Pulse 79   Ht 5\' 10"  (1.778 m)   Wt 191 lb (86.6 kg)   BMI 27.41 kg/m  , BMI Body mass index is 27.41 kg/m.  Wt Readings from Last 3 Encounters:  11/30/20 191 lb (86.6 kg)  11/28/20 192 lb (87.1 kg)  11/21/20 186 lb (84.4 kg)     GEN: Well nourished, well developed, in no acute  distress. HEENT: normal. Neck: Supple, no JVD, carotid bruits, or masses. Cardiac: RRR, no murmurs, rubs, or gallops. No clubbing, cyanosis, edema.  Radials/PT 2+ and equal bilaterally.  Respiratory:  Respirations regular and unlabored, clear to auscultation bilaterally. GI: Soft, nontender, nondistended. MS: No deformity or atrophy. Skin: Warm and dry, no rash. Neuro:  Strength and sensation are intact. Psych: Normal affect.  Assessment & Plan    Nonobstructive CAD - 11/08/20 cardiac CTA with mild nonobstructive disease. . Stable with no anginal symptoms. No indication for ischemic evaluation. GDMT includes Plavix, Atovastatin and addition of Zetia today. Heart healthy diet and regular cardiovascular exercise encouraged.   Long discussion regarding secondary prevention.   HTN - BP well controlled. Continue current antihypertensive regimen.  Home monitoring encouraged.  HLD - LDL goal <70. 11/08/20 LDL 68. Continue Atorvastin 80mg  QD. Start Zetia 10mg  QD. CMP, lipid panel  in 8 weeks  DM2 - Continue to follow with PCP. If additional therapy needed, consider SGLT2i for cardioprotective benefit.   History of CVA - Continue optimal lipid control. Continue Plavix. Continue to follow with neurology.   Carotid artery stenosis - Due for repeat study 08/2021.   Disposition: Follow up in 4 month(s) with Dr. Oval Linsey or APP.  Signed, Loel Dubonnet, NP 11/30/2020, 8:48 PM [facility]

## 2020-12-01 ENCOUNTER — Telehealth: Payer: Self-pay | Admitting: *Deleted

## 2020-12-01 NOTE — Telephone Encounter (Signed)
Dr Loletha Carrow- Please see telephone note dated 11/17/20. I have since contacted cardiology (as below) to request both cardiac clearance and Plavix clearance on this patient for colonoscopy. It appears we have the "green light" to go forward with the procedure at this point. Would you like me to go ahead and reschedule his procedure with the blessing of cardiology?

## 2020-12-01 NOTE — Telephone Encounter (Signed)
Request for surgical clearance:     Endoscopy Procedure  What type of surgery is being performed?     Endoscopy/colonoscopy  When is this surgery scheduled?     TBD  What type of clearance is required ?   Pharmacy/Medical  Is patient cleared to go forward with procedures from cardiac standpoint?  Are there any medications that need to be held prior to surgery and how long? Plavix, 5 days  Practice name and name of physician performing surgery?      [facility]  What is your office phone and fax number?      Phone- [phone]  [phone]  Anesthesia type (None, local, MAC, general) ?       MAC

## 2020-12-01 NOTE — Telephone Encounter (Signed)
Yes, please reschedule this patient's colonoscopy with me in the [facility] with preprocedure Plavix instructions per cardiology recommendations.  Thank you for following up on this.  - HD

## 2020-12-01 NOTE — Telephone Encounter (Signed)
    Patient Name: Jon [name]  DOB: January 06, 1956 MRN: [id number]  Primary Cardiologist: Skeet Latch, MD  Chart reviewed as part of pre-operative protocol coverage. Given past medical history and time since last visit, based on ACC/AHA guidelines, Jon [name] would be at acceptable risk for the planned procedure without further cardiovascular testing.   He had recent admission with new finding of nonobstructive coronary disease. As such, permissible to hold Plavix 5-7 days prior to procedure. Would recommend continuing aspirin throughout peri-procedural period.   I will route this recommendation to the requesting party via Epic fax function and remove from pre-op pool.  Please call with questions.  Loel Dubonnet, NP 12/01/2020, 10:06 AM

## 2020-12-02 ENCOUNTER — Encounter: Payer: Self-pay | Admitting: *Deleted

## 2020-12-02 NOTE — Telephone Encounter (Signed)
I have spoken to patient who is agreeable to rescheduling his endoscopy/colonoscopy. He has been rescheduled to 01/05/21 at 8 am with 7 am arrival. I have advised patient that per cardiology, he has been cleared to have procedure and should hold his plavix 5 days prior to his upcoming procedure but continue ASA. Patient verbalizes clear understanding of these instructions. I have advised that I will send updated instructions via MyChart.

## 2020-12-07 ENCOUNTER — Other Ambulatory Visit: Payer: Self-pay

## 2020-12-07 ENCOUNTER — Other Ambulatory Visit: Payer: Self-pay | Admitting: Nurse Practitioner

## 2020-12-07 DIAGNOSIS — E119 Type 2 diabetes mellitus without complications: Secondary | ICD-10-CM

## 2020-12-09 ENCOUNTER — Other Ambulatory Visit: Payer: Self-pay

## 2020-12-13 ENCOUNTER — Other Ambulatory Visit: Payer: Self-pay

## 2020-12-19 ENCOUNTER — Other Ambulatory Visit: Payer: Self-pay

## 2020-12-19 ENCOUNTER — Ambulatory Visit: Payer: Medicare Other | Attending: Nurse Practitioner | Admitting: Nurse Practitioner

## 2020-12-19 VITALS — BP 132/70 | HR 86 | Ht 70.0 in | Wt 192.8 lb

## 2020-12-19 DIAGNOSIS — E785 Hyperlipidemia, unspecified: Secondary | ICD-10-CM | POA: Diagnosis not present

## 2020-12-19 DIAGNOSIS — I10 Essential (primary) hypertension: Secondary | ICD-10-CM | POA: Diagnosis not present

## 2020-12-19 DIAGNOSIS — E119 Type 2 diabetes mellitus without complications: Secondary | ICD-10-CM | POA: Diagnosis not present

## 2020-12-19 LAB — POCT GLYCOSYLATED HEMOGLOBIN (HGB A1C): HbA1c, POC (controlled diabetic range): 6.7 % (ref 0.0–7.0)

## 2020-12-19 NOTE — Progress Notes (Signed)
Assessment & Plan:  Jon [name] was seen today for diabetes.  Diagnoses and all orders for this visit:  Type 2 diabetes mellitus without complication, without long-term current use of insulin (HCC) -     POCT glycosylated hemoglobin (Hb A1C) Continue blood sugar control as discussed in office today, low carbohydrate diet, and regular physical exercise as tolerated, 150 minutes per week (30 min each day, 5 days per week, or 50 min 3 days per week). Keep blood sugar logs with fasting goal of 90-130 mg/dl, post prandial (after you eat) less than 180.  For Hypoglycemia: BS <60 and Hyperglycemia BS >400; contact the clinic ASAP. Annual eye exams and foot exams are recommended.   Primary hypertension Continue all antihypertensives as prescribed.  Remember to bring in your blood pressure log with you for your follow up appointment.  DASH/Mediterranean Diets are healthier choices for HTN.     Dyslipidemia INSTRUCTIONS: Work on a low fat, heart healthy diet and participate in regular aerobic exercise program by working out at least 150 minutes per week; 5 days a week-30 minutes per day. Avoid red meat/beef/steak,  fried foods. junk foods, sodas, sugary drinks, unhealthy snacking, alcohol and smoking.  Drink at least 80 oz of water per day and monitor your carbohydrate intake daily.     Patient has been counseled on age-appropriate routine health concerns for screening and prevention. These are reviewed and up-to-date. Referrals have been placed accordingly. Immunizations are up-to-date or declined.    Subjective:   Chief Complaint  Patient presents with   Diabetes   HPI  Jon [name] 65 y.o. male presents to office today for follow up to DM, HTN and HPL He has a past medical history of Carotid artery occlusion, DM2, History of kidney stones, Hyperlipidemia, Hypertension, Stomach ulcer, and Stroke     Primary Hypertension Well controlled. Taking amlodipine 10 mg and lsartan 50 mg daily as  prescribed. Denies chest pain, shortness of breath, palpitations, lightheadedness, dizziness, headaches or BLE edema.  BP Readings from Last 3 Encounters:  12/19/20 132/70  11/30/20 122/78  11/28/20 133/82    DM 2 Well controlled. Taking  amaryl 8 mg daily, trulicity A999333 mg weekly and metformin 1000 mg BID.  LDL not at goal with atorvastatin 80 mg daily and zetia 10 mg daily. He denies any symptoms of hypo or hyperglycemia.  Lab Results  Component Value Date   HGBA1C 6.7 12/19/2020   Lab Results  Component Value Date   LDLCALC 86 11/08/2020     Review of Systems  Constitutional:  Negative for fever, malaise/fatigue and weight loss.  HENT: Negative.  Negative for nosebleeds.   Eyes: Negative.  Negative for blurred vision, double vision and photophobia.  Respiratory: Negative.  Negative for cough and shortness of breath.   Cardiovascular: Negative.  Negative for chest pain, palpitations and leg swelling.  Gastrointestinal: Negative.  Negative for heartburn, nausea and vomiting.  Musculoskeletal: Negative.  Negative for myalgias.  Neurological: Negative.  Negative for dizziness, focal weakness, seizures and headaches.  Psychiatric/Behavioral: Negative.  Negative for suicidal ideas.    Past Medical History:  Diagnosis Date   Carotid artery occlusion    Diabetes mellitus without complication ([place])    type II   History of kidney stones    Hyperlipidemia    Hypertension    Kidney stones    Stomach ulcer    Stroke [facility])    Dec. 14 2018    Past Surgical History:  Procedure Laterality Date  CYSTOSCOPY Left 12/10/2016   Procedure: cystoscopy with left ureteral stone extraction ;  Surgeon: Kathie Rhodes, MD;  Location: [facility];  Service: Urology;  Laterality: Left;   HERNIA REPAIR     Stomach ulcer      Family History  Problem Relation Age of Onset   Diabetes Father    Stroke Father    Hypertension Father    Hypertension Mother    Diabetes Sister    Diabetes Son     Diabetes Daughter     Social History Reviewed with no changes to be made today.   Outpatient Medications Prior to Visit  Medication Sig Dispense Refill   amLODipine (NORVASC) 10 MG tablet TAKE 1 TABLET BY MOUTH  DAILY 90 tablet 3   clopidogrel (PLAVIX) 75 MG tablet TAKE 1 TABLET BY MOUTH  DAILY 90 tablet 3   ezetimibe (ZETIA) 10 MG tablet Take 1 tablet (10 mg total) by mouth daily. 90 tablet 3   glimepiride (AMARYL) 4 MG tablet TAKE 2 TABLETS BY MOUTH  DAILY WITH BREAKFAST 60 tablet 4   glucose blood (ONETOUCH VERIO) test strip USE AS INSTRUCTED TO CHECK BLOOD SUGAR DAILY. (Patient taking differently: USE AS INSTRUCTED TO CHECK BLOOD SUGAR DAILY.) 50 strip 3   losartan (COZAAR) 50 MG tablet Take 1 tablet (50 mg total) by mouth daily. 90 tablet 3   metFORMIN (GLUCOPHAGE) 1000 MG tablet TAKE 1 TABLET BY MOUTH  TWICE DAILY WITH MEALS 180 tablet 0   pantoprazole (PROTONIX) 40 MG tablet Take 1 tablet (40 mg total) by mouth daily. 30 tablet 1   TRULICITY A999333 0000000 SOPN INJECT SUBCUTANEOUSLY 0.75  MG ONCE WEEKLY 6 mL 0   atorvastatin (LIPITOR) 80 MG tablet Take 1 tablet (80 mg total) by mouth daily. 90 tablet 2   No facility-administered medications prior to visit.    No Known Allergies     Objective:    BP 132/70   Pulse 86   Ht '5\' 10"'$  (1.778 m)   Wt 192 lb 12.8 oz (87.5 kg)   SpO2 96%   BMI 27.66 kg/m  Wt Readings from Last 3 Encounters:  12/19/20 192 lb 12.8 oz (87.5 kg)  11/30/20 191 lb (86.6 kg)  11/28/20 192 lb (87.1 kg)    Physical Exam Vitals and nursing note reviewed.  Constitutional:      Appearance: He is well-developed.  HENT:     Head: Normocephalic and atraumatic.  Cardiovascular:     Rate and Rhythm: Normal rate and regular rhythm.     Heart sounds: Normal heart sounds. No murmur heard.   No friction rub. No gallop.  Pulmonary:     Effort: Pulmonary effort is normal. No tachypnea or respiratory distress.     Breath sounds: Normal breath sounds. No  decreased breath sounds, wheezing, rhonchi or rales.  Chest:     Chest wall: No tenderness.  Abdominal:     General: Bowel sounds are normal.     Palpations: Abdomen is soft.  Musculoskeletal:        General: Normal range of motion.     Cervical back: Normal range of motion.  Skin:    General: Skin is warm and dry.  Neurological:     Mental Status: He is alert and oriented to person, place, and time.     Coordination: Coordination normal.  Psychiatric:        Behavior: Behavior normal. Behavior is cooperative.        Thought Content: Thought content  normal.        Judgment: Judgment normal.         Patient has been counseled extensively about nutrition and exercise as well as the importance of adherence with medications and regular follow-up. The patient was given clear instructions to go to ER or return to medical center if symptoms don't improve, worsen or new problems develop. The patient verbalized understanding.   Follow-up: Return in about 3 months (around 03/21/2021).   Gildardo Pounds, FNP-BC [facility] [place], [place]   12/20/2020, 8:15 PM

## 2020-12-20 ENCOUNTER — Encounter: Payer: Self-pay | Admitting: Nurse Practitioner

## 2021-01-02 ENCOUNTER — Ambulatory Visit: Payer: Medicare Other | Admitting: Nurse Practitioner

## 2021-01-05 ENCOUNTER — Ambulatory Visit (AMBULATORY_SURGERY_CENTER): Payer: Medicare Other | Admitting: Gastroenterology

## 2021-01-05 ENCOUNTER — Encounter: Payer: Self-pay | Admitting: Gastroenterology

## 2021-01-05 ENCOUNTER — Other Ambulatory Visit: Payer: Self-pay

## 2021-01-05 VITALS — BP 117/83 | HR 80 | Temp 97.9°F | Resp 13 | Ht 70.0 in | Wt 193.0 lb

## 2021-01-05 DIAGNOSIS — D123 Benign neoplasm of transverse colon: Secondary | ICD-10-CM

## 2021-01-05 DIAGNOSIS — D509 Iron deficiency anemia, unspecified: Secondary | ICD-10-CM | POA: Diagnosis not present

## 2021-01-05 MED ORDER — SODIUM CHLORIDE 0.9 % IV SOLN: 500.0000 mL | Freq: Once | INTRAVENOUS | Status: DC

## 2021-01-05 NOTE — Op Note (Signed)
[facility] Patient Name: Jon [name] Procedure Date: 01/05/2021 8:03 AM MRN: [id number] Endoscopist: [place]. Loletha Carrow , MD Age: 65 Referring MD:  Date of Birth: [birth year] Gender: Male Account #: [id number] Procedure:                Upper GI endoscopy Indications:              Unexplained microcytic anemia                           (normal iron levels - see clinic note for details) Medicines:                Monitored Anesthesia Care Procedure:                Pre-Anesthesia Assessment:                           - Prior to the procedure, a History and Physical                            was performed, and patient medications and                            allergies were reviewed. The patient's tolerance of                            previous anesthesia was also reviewed. The risks                            and benefits of the procedure and the sedation                            options and risks were discussed with the patient.                            All questions were answered, and informed consent                            was obtained. Prior Anticoagulants: The patient has                            taken Plavix (clopidogrel), last dose was 5 days                            prior to procedure. ASA Grade Assessment: II - A                            patient with mild systemic disease. After reviewing                            the risks and benefits, the patient was deemed in                            satisfactory condition to undergo the procedure.  After obtaining informed consent, the endoscope was                            passed under direct vision. Throughout the                            procedure, the patient's blood pressure, pulse, and                            oxygen saturations were monitored continuously. The                            GIF [id number] [id number] was introduced through the                            mouth, and  advanced to the second part of duodenum.                            The upper GI endoscopy was accomplished without                            difficulty. The patient tolerated the procedure                            well. Scope In: Scope Out: Findings:                 The esophagus was normal.                           The stomach was normal.                           The cardia and gastric fundus were normal on                            retroflexion.                           The examined duodenum was normal. Complications:            No immediate complications. Estimated Blood Loss:     Estimated blood loss: none. Impression:               - Normal esophagus.                           - Normal stomach.                           - Normal examined duodenum.                           - No specimens collected.                           No source of anemia seen on either exam today.  Long-standing microcytic anemia with normal iron                            levels. Recommendation:           - Patient has a contact number available for                            emergencies. The signs and symptoms of potential                            delayed complications were discussed with the                            patient. Return to normal activities tomorrow.                            Written discharge instructions were provided to the                            patient.                           - Resume previous diet.                           - Resume Plavix (clopidogrel) at prior dose today.                           - See the other procedure note for documentation of                            additional recommendations.                           - Return to primary care physician at appointment                            to be scheduled. See above. Suspected thalassemia -                            consider hemoglobin electrophoresis. Tyrena Gohr L. Loletha Carrow,  MD 01/05/2021 8:48:47 AM This report has been signed electronically.

## 2021-01-05 NOTE — Progress Notes (Signed)
Called to room to assist during endoscopic procedure.  Patient ID and intended procedure confirmed with present staff. Received instructions for my participation in the procedure from the performing physician.  

## 2021-01-05 NOTE — Progress Notes (Signed)
Sedate, gd SR's, VSS, report to RN 

## 2021-01-05 NOTE — Progress Notes (Signed)
History and Physical:  This patient presents for endoscopic testing for: Encounter Diagnosis  Name Primary?   Microcytic anemia Yes   Patient reports no clinical changes.Last office visit with me 09/12/20  Subsequent hospital admission for chest pain, no ACS See chart notes re: cardiac evaluation needed. Cardiology office note 11/30/20 - no cardiac ischemic evaluation recommended. Patient reports feeling well since the hospital stay.  Past Medical History: Past Medical History:  Diagnosis Date   Carotid artery occlusion    Diabetes mellitus without complication ([place])    type II   History of kidney stones    Hyperlipidemia    Hypertension    Kidney stones    Stomach ulcer    Stroke [facility])    Dec. 14 2018     Past Surgical History: Past Surgical History:  Procedure Laterality Date   CYSTOSCOPY Left 12/10/2016   Procedure: cystoscopy with left ureteral stone extraction ;  Surgeon: Kathie Rhodes, MD;  Location: [facility];  Service: Urology;  Laterality: Left;   HERNIA REPAIR     Stomach ulcer      Allergies: No Known Allergies  Outpatient Meds: Current Outpatient Medications  Medication Sig Dispense Refill   amLODipine (NORVASC) 10 MG tablet TAKE 1 TABLET BY MOUTH  DAILY 90 tablet 3   ezetimibe (ZETIA) 10 MG tablet Take 1 tablet (10 mg total) by mouth daily. 90 tablet 3   glimepiride (AMARYL) 4 MG tablet TAKE 2 TABLETS BY MOUTH  DAILY WITH BREAKFAST 60 tablet 4   glucose blood (ONETOUCH VERIO) test strip USE AS INSTRUCTED TO CHECK BLOOD SUGAR DAILY. (Patient taking differently: USE AS INSTRUCTED TO CHECK BLOOD SUGAR DAILY.) 50 strip 3   losartan (COZAAR) 50 MG tablet Take 1 tablet (50 mg total) by mouth daily. 90 tablet 3   metFORMIN (GLUCOPHAGE) 1000 MG tablet TAKE 1 TABLET BY MOUTH  TWICE DAILY WITH MEALS 180 tablet 0   pantoprazole (PROTONIX) 40 MG tablet Take 1 tablet (40 mg total) by mouth daily. 30 tablet 1   TRULICITY A999333 0000000 SOPN INJECT SUBCUTANEOUSLY 0.75  MG  ONCE WEEKLY 6 mL 0   atorvastatin (LIPITOR) 80 MG tablet Take 1 tablet (80 mg total) by mouth daily. 90 tablet 2   clopidogrel (PLAVIX) 75 MG tablet TAKE 1 TABLET BY MOUTH  DAILY 90 tablet 3   Current Facility-Administered Medications  Medication Dose Route Frequency Provider Last Rate Last Admin   0.9 %  sodium chloride infusion  500 mL Intravenous Once Doran Stabler, MD          ___________________________________________________________________ Objective   Exam:  BP 138/89   Pulse (!) 113   Temp 97.9 F (36.6 C)   Ht '5\' 10"'$  (1.778 m)   Wt 193 lb (87.5 kg)   SpO2 97%   BMI 27.69 kg/m   CV: RRR without murmur, S1/S2 Resp: clear to auscultation bilaterally, normal RR and effort noted GI: soft, no tenderness, with active bowel sounds.   Assessment: Encounter Diagnosis  Name Primary?   Microcytic anemia Yes     Plan: Colonoscopy EGD  The benefits and risks of the planned procedure were described in detail with the patient or (when appropriate) their health care proxy.  Risks were outlined as including, but not limited to, bleeding, infection, perforation, adverse medication reaction leading to cardiac or pulmonary decompensation, pancreatitis (if ERCP).  The limitation of incomplete mucosal visualization was also discussed.  No guarantees or warranties were given.    The patient  is appropriate for an endoscopic procedure in the ambulatory setting.   - Wilfrid Lund, MD

## 2021-01-05 NOTE — Patient Instructions (Signed)
Please read handouts provided. Continue present medications. Await pathology results. Resume Plavix ( clopidogrel ) at prior dose today. Repeat colonoscopy in 3 years.   YOU HAD AN ENDOSCOPIC PROCEDURE TODAY AT [facility]:   Refer to the procedure report that was given to you for any specific questions about what was found during the examination.  If the procedure report does not answer your questions, please call your gastroenterologist to clarify.  If you requested that your care partner not be given the details of your procedure findings, then the procedure report has been included in a sealed envelope for you to review at your convenience later.  YOU SHOULD EXPECT: Some feelings of bloating in the abdomen. Passage of more gas than usual.  Walking can help get rid of the air that was put into your GI tract during the procedure and reduce the bloating. If you had a lower endoscopy (such as a colonoscopy or flexible sigmoidoscopy) you may notice spotting of blood in your stool or on the toilet paper. If you underwent a bowel prep for your procedure, you may not have a normal bowel movement for a few days.  Please Note:  You might notice some irritation and congestion in your nose or some drainage.  This is from the oxygen used during your procedure.  There is no need for concern and it should clear up in a day or so.  SYMPTOMS TO REPORT IMMEDIATELY:  Following lower endoscopy (colonoscopy or flexible sigmoidoscopy):  Excessive amounts of blood in the stool  Significant tenderness or worsening of abdominal pains  Swelling of the abdomen that is new, acute  Fever of 100F or higher  Following upper endoscopy (EGD)  Vomiting of blood or coffee ground material  New chest pain or pain under the shoulder blades  Painful or persistently difficult swallowing  New shortness of breath  Fever of 100F or higher  Black, tarry-looking stools  For urgent or emergent issues, a  gastroenterologist can be reached at any hour by calling [phone]. Do not use MyChart messaging for urgent concerns.    DIET:  We do recommend a small meal at first, but then you may proceed to your regular diet.  Drink plenty of fluids but you should avoid alcoholic beverages for 24 hours.  ACTIVITY:  You should plan to take it easy for the rest of today and you should NOT DRIVE or use heavy machinery until tomorrow (because of the sedation medicines used during the test).    FOLLOW UP: Our staff will call the number listed on your records 48-72 hours following your procedure to check on you and address any questions or concerns that you may have regarding the information given to you following your procedure. If we do not reach you, we will leave a message.  We will attempt to reach you two times.  During this call, we will ask if you have developed any symptoms of COVID 19. If you develop any symptoms (ie: fever, flu-like symptoms, shortness of breath, cough etc.) before then, please call [phone].  If you test positive for Covid 19 in the 2 weeks post procedure, please call and report this information to [place].    If any biopsies were taken you will be contacted by phone or by letter within the next 1-3 weeks.  Please call us at [phone] if you have not heard about the biopsies in 3 weeks.    SIGNATURES/CONFIDENTIALITY: You and/or your care  partner have signed paperwork which will be entered into your electronic medical record.  These signatures attest to the fact that that the information above on your After Visit Summary has been reviewed and is understood.  Full responsibility of the confidentiality of this discharge information lies with you and/or your care-partner.  

## 2021-01-05 NOTE — Op Note (Signed)
[facility] Patient Name: Jon [name] Procedure Date: 01/05/2021 8:04 AM MRN: [id number] Endoscopist: Jon [name] , MD Age: 65 Referring MD:  Date of Birth: 01-01-1956 Gender: Male Account #: [id number] Procedure:                Colonoscopy Indications:              Unexplained microcytic anemia (normal iron levels -                            see clinic note for details) Medicines:                Monitored Anesthesia Care Procedure:                Pre-Anesthesia Assessment:                           - Prior to the procedure, a History and Physical                            was performed, and patient medications and                            allergies were reviewed. The patient's tolerance of                            previous anesthesia was also reviewed. The risks                            and benefits of the procedure and the sedation                            options and risks were discussed with the patient.                            All questions were answered, and informed consent                            was obtained. Prior Anticoagulants: The patient has                            taken Plavix (clopidogrel), last dose was 5 days                            prior to procedure. ASA Grade Assessment: II - A                            patient with mild systemic disease. After reviewing                            the risks and benefits, the patient was deemed in                            satisfactory condition to undergo the procedure.  After obtaining informed consent, the colonoscope                            was passed under direct vision. Throughout the                            procedure, the patient's blood pressure, pulse, and                            oxygen saturations were monitored continuously. The                            Olympus [id number] ([id number]) Colonoscope was                            introduced through the  anus and advanced to the the                            cecum, identified by appendiceal orifice and                            ileocecal valve. The colonoscopy was performed                            without difficulty. The patient tolerated the                            procedure well. The quality of the bowel                            preparation was fair (opaque liquid lavaged and                            suctioned, residual scattered fibrous material that                            could not be cleared). The ileocecal valve,                            appendiceal orifice, and rectum were photographed.                            The bowel preparation used was Plenvu. Scope In: 8:15:29 AM Scope Out: 8:31:15 AM Scope Withdrawal Time: 0 hours 11 minutes 41 seconds  Total Procedure Duration: 0 hours 15 minutes 46 seconds  Findings:                 The perianal and digital rectal examinations were                            normal.                           A diminutive polyp was found in the transverse  colon. The polyp was flat. The polyp was removed                            with a cold snare. Resection and retrieval were                            complete.                           The exam was otherwise without abnormality on                            direct and retroflexion views. Complications:            No immediate complications. Estimated Blood Loss:     Estimated blood loss was minimal. Impression:               - Preparation of the colon was fair.                           - One diminutive polyp in the transverse colon,                            removed with a cold snare. Resected and retrieved.                           - The examination was otherwise normal on direct                            and retroflexion views. Recommendation:           - Patient has a contact number available for                            emergencies. The signs  and symptoms of potential                            delayed complications were discussed with the                            patient. Return to normal activities tomorrow.                            Written discharge instructions were provided to the                            patient.                           - Resume previous diet.                           - Resume Plavix (clopidogrel) at prior dose today.                           - Await pathology results.                           -  Repeat colonoscopy in 3 years for surveillance                            due to fair prep.                           - See the other procedure note for documentation of                            additional recommendations. [name] L. Loletha Carrow, MD 01/05/2021 8:44:24 AM This report has been signed electronically.

## 2021-01-09 ENCOUNTER — Telehealth: Payer: Self-pay | Admitting: *Deleted

## 2021-01-09 ENCOUNTER — Telehealth: Payer: Self-pay

## 2021-01-09 NOTE — Telephone Encounter (Signed)
First attempt follow up call to pt, unable to leave message

## 2021-01-09 NOTE — Telephone Encounter (Signed)
  Follow up Call-  Call back number 01/05/2021  Post procedure Call Back phone  # [phone]  Permission to leave phone message Yes  Some recent data might be hidden     No answer at 2nd attempt follow up phone call.  Unable to leave a message.

## 2021-01-10 ENCOUNTER — Encounter: Payer: Self-pay | Admitting: Gastroenterology

## 2021-01-15 ENCOUNTER — Telehealth: Payer: Self-pay

## 2021-01-15 NOTE — Telephone Encounter (Signed)
Lmom for patient to ret call.

## 2021-01-17 ENCOUNTER — Other Ambulatory Visit: Payer: Self-pay | Admitting: Nurse Practitioner

## 2021-01-17 ENCOUNTER — Other Ambulatory Visit: Payer: Self-pay

## 2021-01-17 DIAGNOSIS — E119 Type 2 diabetes mellitus without complications: Secondary | ICD-10-CM

## 2021-01-17 NOTE — Telephone Encounter (Signed)
Patient returned call to Tamala Julian was not able to reach her so please call patient at Ph# [phone]

## 2021-01-18 NOTE — Telephone Encounter (Signed)
Future OV 03/20/21 Approved per protocol for 90 days with one refill. Requested Prescriptions  Pending Prescriptions Disp Refills  . metFORMIN (GLUCOPHAGE) 1000 MG tablet [Pharmacy Med Name: metFORMIN HCl 1000 MG Oral Tablet] 180 tablet 1    Sig: TAKE 1 TABLET BY MOUTH  TWICE DAILY WITH MEALS     Endocrinology:  Diabetes - Biguanides Passed - 01/17/2021 11:28 PM      Passed - Cr in normal range and within 360 days    Creatinine, Ser  Date Value Ref Range Status  11/08/2020 0.67 0.61 - 1.24 mg/dL Final   Creatinine,U  Date Value Ref Range Status  06/08/2016 171.6 mg/dL Final         Passed - HBA1C is between 0 and 7.9 and within 180 days    HbA1c, POC (controlled diabetic range)  Date Value Ref Range Status  12/19/2020 6.7 0.0 - 7.0 % Final         Passed - AA eGFR in normal range and within 360 days    GFR calc Af Amer  Date Value Ref Range Status  06/13/2020 100 >59 mL/min/1.73 Final    Comment:    **In accordance with recommendations from the NKF-ASN Task force,**   [facility] is in the process of updating its eGFR calculation to the   2021 CKD-EPI creatinine equation that estimates kidney function   without a race variable.    GFR, Estimated  Date Value Ref Range Status  11/08/2020 >60 >60 mL/min Final    Comment:    (NOTE) Calculated using the CKD-EPI Creatinine Equation (2021)    GFR  Date Value Ref Range Status  03/13/2017 95.90 >60.00 mL/min Final   eGFR  Date Value Ref Range Status  09/12/2020 87 >59 mL/min/1.73 Final         Passed - Valid encounter within last 6 months    Recent Outpatient Visits          1 month ago Type 2 diabetes mellitus without complication, without long-term current use of insulin ([place])   [place], Vernia Buff, NP   1 month ago Hospital discharge follow-up   [place] Gildardo Pounds, NP   4 months ago Primary hypertension   [place], Vernia Buff, NP   8 months ago Primary hypertension   [place], [place] W, NP   11 months ago Type 2 diabetes mellitus without complication, without long-term current use of insulin [facility])   [place], Vernia Buff, NP      Future Appointments            In 2 months Gildardo Pounds, NP [place]   In 2 months Skeet Latch, MD [facility]

## 2021-01-23 ENCOUNTER — Other Ambulatory Visit: Payer: Self-pay

## 2021-01-24 ENCOUNTER — Other Ambulatory Visit: Payer: Self-pay

## 2021-02-02 ENCOUNTER — Other Ambulatory Visit: Payer: Self-pay | Admitting: Nurse Practitioner

## 2021-02-02 DIAGNOSIS — E119 Type 2 diabetes mellitus without complications: Secondary | ICD-10-CM

## 2021-02-06 ENCOUNTER — Other Ambulatory Visit: Payer: Self-pay

## 2021-02-06 MED ORDER — CHLORHEXIDINE GLUCONATE 0.12 % MT SOLN
OROMUCOSAL | 0 refills | Status: DC
  Filled 2021-02-06: qty 473, 14d supply, fill #0

## 2021-02-14 DIAGNOSIS — Z20822 Contact with and (suspected) exposure to covid-19: Secondary | ICD-10-CM | POA: Diagnosis not present

## 2021-02-23 ENCOUNTER — Other Ambulatory Visit: Payer: Self-pay | Admitting: Nurse Practitioner

## 2021-02-23 ENCOUNTER — Other Ambulatory Visit: Payer: Self-pay | Admitting: Family Medicine

## 2021-02-23 DIAGNOSIS — I63332 Cerebral infarction due to thrombosis of left posterior cerebral artery: Secondary | ICD-10-CM

## 2021-02-24 NOTE — Telephone Encounter (Signed)
Requested medication (s) are due for refill today: yes  Requested medication (s) are on the active medication list: expired  Last refill:  05/23/20  Future visit scheduled: yes  Notes to clinic:  med expired 11/30/20   Requested Prescriptions  Pending Prescriptions Disp Refills   atorvastatin (LIPITOR) 80 MG tablet [Pharmacy Med Name: Atorvastatin Calcium 80 MG Oral Tablet] 90 tablet 3    Sig: TAKE 1 TABLET BY MOUTH  DAILY     Cardiovascular:  Antilipid - Statins Failed - 02/23/2021 10:22 PM      Failed - HDL in normal range and within 360 days    HDL  Date Value Ref Range Status  11/08/2020 31 (L) >40 mg/dL Final  06/13/2020 34 (L) >39 mg/dL Final          Passed - Total Cholesterol in normal range and within 360 days    Cholesterol, Total  Date Value Ref Range Status  06/13/2020 137 100 - 199 mg/dL Final   Cholesterol  Date Value Ref Range Status  11/08/2020 125 0 - 200 mg/dL Final          Passed - LDL in normal range and within 360 days    LDL Chol Calc (NIH)  Date Value Ref Range Status  06/13/2020 79 0 - 99 mg/dL Final   LDL Cholesterol  Date Value Ref Range Status  11/08/2020 86 0 - 99 mg/dL Final    Comment:           Total Cholesterol/HDL:CHD Risk Coronary Heart Disease Risk Table                     Men   Women  1/2 Average Risk   3.4   3.3  Average Risk       5.0   4.4  2 X Average Risk   9.6   7.1  3 X Average Risk  23.4   11.0        Use the calculated Patient Ratio above and the CHD Risk Table to determine the patient's CHD Risk.        ATP III CLASSIFICATION (LDL):  <100     mg/dL   Optimal  100-129  mg/dL   Near or Above                    Optimal  130-159  mg/dL   Borderline  160-189  mg/dL   High  >190     mg/dL   Very High Performed at [facility] [address] [place], [place] [address]           Passed - Triglycerides in normal range and within 360 days    Triglycerides  Date Value Ref Range Status  11/08/2020 42  <150 mg/dL Final          Passed - Patient is not pregnant      Passed - Valid encounter within last 12 months    Recent Outpatient Visits           2 months ago Type 2 diabetes mellitus without complication, without long-term current use of insulin [facility])   [place], Vernia Buff, NP   2 months ago Hospital discharge follow-up   [place], NP   5 months ago Primary hypertension   [place], NP   9 months ago Primary  hypertension   [place], [place] W, NP   1 year ago Type 2 diabetes mellitus without complication, without long-term current use of insulin [facility])   [place], Vernia Buff, NP       Future Appointments             In 3 weeks Gildardo Pounds, NP [place]   In 1 month Skeet Latch, MD [facility], [facility]

## 2021-02-24 NOTE — Telephone Encounter (Signed)
Requested medication (s) are due for refill today - expired Rx  Requested medication (s) are on the active medication list -yes  Future visit scheduled -yes  Last refill: 01/05/20 #90 3RF  Notes to clinic: Request RF: expired Rx  Requested Prescriptions  Pending Prescriptions Disp Refills   amLODipine (NORVASC) 10 MG tablet [Pharmacy Med Name: amLODIPine Besylate 10 MG Oral Tablet] 90 tablet 3    Sig: TAKE 1 TABLET BY MOUTH  DAILY     Cardiovascular:  Calcium Channel Blockers Passed - 02/23/2021 10:22 PM      Passed - Last BP in normal range    BP Readings from Last 1 Encounters:  01/05/21 117/83          Passed - Valid encounter within last 6 months    Recent Outpatient Visits           2 months ago Type 2 diabetes mellitus without complication, without long-term current use of insulin ([place])   [place], Vernia Buff, NP   2 months ago Hospital discharge follow-up   [place] Gildardo Pounds, NP   5 months ago Primary hypertension   [place], Vernia Buff, NP   9 months ago Primary hypertension   [place], [place] W, NP   1 year ago Type 2 diabetes mellitus without complication, without long-term current use of insulin ([place])   [place], Vernia Buff, NP       Future Appointments             In 3 weeks Gildardo Pounds, NP [place]   In 1 month Skeet Latch, MD [facility]               Requested Prescriptions  Pending Prescriptions Disp Refills   amLODipine ([place]) 10 MG tablet [Pharmacy Med Name: amLODIPine Besylate 10 MG Oral Tablet] 90 tablet 3    Sig: TAKE 1 TABLET BY MOUTH  DAILY     Cardiovascular:  Calcium Channel Blockers Passed - 02/23/2021 10:22 PM      Passed - Last BP in normal range    BP Readings  from Last 1 Encounters:  01/05/21 117/83          Passed - Valid encounter within last 6 months    Recent Outpatient Visits           2 months ago Type 2 diabetes mellitus without complication, without long-term current use of insulin ([place])   [place], Vernia Buff, NP   2 months ago Hospital discharge follow-up   [place], Zel[place] W, NP   5 months ago Primary hypertension   [place], Vernia Buff, NP   9 months ago Primary hypertension   [place] [facility], [place] W, NP   1 year ago Type 2 diabetes mellitus without complication, without long-term current use of insulin [facility])   [place], Vernia Buff, NP       Future Appointments             In 3 weeks Gildardo Pounds, NP [place]   In 1 month Skeet Latch, MD [facility]

## 2021-03-06 ENCOUNTER — Other Ambulatory Visit: Payer: Self-pay

## 2021-03-06 ENCOUNTER — Other Ambulatory Visit: Payer: Self-pay | Admitting: Nurse Practitioner

## 2021-03-06 DIAGNOSIS — E119 Type 2 diabetes mellitus without complications: Secondary | ICD-10-CM

## 2021-03-06 MED ORDER — ONETOUCH VERIO VI STRP
ORAL_STRIP | 3 refills | Status: DC
  Filled 2021-03-06: qty 50, 50d supply, fill #0
  Filled 2021-04-28: qty 50, 50d supply, fill #1
  Filled 2021-06-19: qty 50, 50d supply, fill #0
  Filled 2021-08-14: qty 50, 50d supply, fill #1

## 2021-03-06 NOTE — Telephone Encounter (Signed)
Requested Prescriptions  Pending Prescriptions Disp Refills  . glucose blood (ONETOUCH VERIO) test strip 50 strip 3    Sig: USE AS INSTRUCTED TO CHECK BLOOD SUGAR DAILY.     Endocrinology: Diabetes - Testing Supplies Passed - 03/06/2021  3:17 PM      Passed - Valid encounter within last 12 months    Recent Outpatient Visits          2 months ago Type 2 diabetes mellitus without complication, without long-term current use of insulin [facility])   [place], Vernia Buff, NP   3 months ago Hospital discharge follow-up   [place] Gildardo Pounds, NP   6 months ago Primary hypertension   [place], Vernia Buff, NP   9 months ago Primary hypertension   [place], [place] W, NP   1 year ago Type 2 diabetes mellitus without complication, without long-term current use of insulin [facility])   [place], Vernia Buff, NP      Future Appointments            In 2 weeks Gildardo Pounds, NP [place]   In 3 weeks Skeet Latch, MD [facility]

## 2021-03-07 ENCOUNTER — Other Ambulatory Visit: Payer: Self-pay

## 2021-03-10 ENCOUNTER — Other Ambulatory Visit: Payer: Self-pay

## 2021-03-13 ENCOUNTER — Other Ambulatory Visit: Payer: Self-pay

## 2021-03-13 DIAGNOSIS — I10 Essential (primary) hypertension: Secondary | ICD-10-CM | POA: Diagnosis not present

## 2021-03-13 DIAGNOSIS — E785 Hyperlipidemia, unspecified: Secondary | ICD-10-CM | POA: Diagnosis not present

## 2021-03-13 DIAGNOSIS — Z8673 Personal history of transient ischemic attack (TIA), and cerebral infarction without residual deficits: Secondary | ICD-10-CM | POA: Diagnosis not present

## 2021-03-13 DIAGNOSIS — I2510 Atherosclerotic heart disease of native coronary artery without angina pectoris: Secondary | ICD-10-CM | POA: Diagnosis not present

## 2021-03-13 LAB — COMPREHENSIVE METABOLIC PANEL
ALT: 25 IU/L (ref 0–44)
AST: 21 IU/L (ref 0–40)
Albumin/Globulin Ratio: 1.5 (ref 1.2–2.2)
Albumin: 4.7 g/dL (ref 3.8–4.8)
Alkaline Phosphatase: 88 IU/L (ref 44–121)
BUN/Creatinine Ratio: 14 (ref 10–24)
BUN: 15 mg/dL (ref 8–27)
Bilirubin Total: 0.6 mg/dL (ref 0.0–1.2)
CO2: 26 mmol/L (ref 20–29)
Calcium: 9.7 mg/dL (ref 8.6–10.2)
Chloride: 103 mmol/L (ref 96–106)
Creatinine, Ser: 1.09 mg/dL (ref 0.76–1.27)
Globulin, Total: 3.2 g/dL (ref 1.5–4.5)
Glucose: 175 mg/dL — ABNORMAL HIGH (ref 70–99)
Potassium: 4.9 mmol/L (ref 3.5–5.2)
Sodium: 142 mmol/L (ref 134–144)
Total Protein: 7.9 g/dL (ref 6.0–8.5)
eGFR: 74 mL/min/{1.73_m2} (ref >59)

## 2021-03-14 LAB — LIPID PANEL
Chol/HDL Ratio: 3 ratio (ref 0.0–5.0)
Cholesterol, Total: 102 mg/dL (ref 100–199)
HDL: 34 mg/dL — ABNORMAL LOW (ref >39)
LDL Chol Calc (NIH): 53 mg/dL (ref 0–99)
Triglycerides: 73 mg/dL (ref 0–149)
VLDL Cholesterol Cal: 15 mg/dL (ref 5–40)

## 2021-03-16 ENCOUNTER — Telehealth: Payer: Self-pay | Admitting: Cardiovascular Disease

## 2021-03-16 NOTE — Telephone Encounter (Signed)
Patient is returning call to discuss lab results. 

## 2021-03-16 NOTE — Telephone Encounter (Signed)
Called patient. Patient made aware of his lab results. Verbalized understanding. No questions or concerns expressed at this time.

## 2021-03-17 NOTE — Telephone Encounter (Signed)
Thank you so much

## 2021-03-20 ENCOUNTER — Encounter: Payer: Self-pay | Admitting: Nurse Practitioner

## 2021-03-20 ENCOUNTER — Ambulatory Visit: Payer: Medicare Other | Attending: Nurse Practitioner | Admitting: Nurse Practitioner

## 2021-03-20 ENCOUNTER — Other Ambulatory Visit: Payer: Self-pay

## 2021-03-20 VITALS — BP 148/80 | HR 83 | Ht 70.0 in | Wt 190.2 lb

## 2021-03-20 DIAGNOSIS — E119 Type 2 diabetes mellitus without complications: Secondary | ICD-10-CM

## 2021-03-20 DIAGNOSIS — I10 Essential (primary) hypertension: Secondary | ICD-10-CM

## 2021-03-20 DIAGNOSIS — D649 Anemia, unspecified: Secondary | ICD-10-CM

## 2021-03-20 LAB — POCT GLYCOSYLATED HEMOGLOBIN (HGB A1C): Hemoglobin A1C: 7.2 % — AB (ref 4.0–5.6)

## 2021-03-20 LAB — GLUCOSE, POCT (MANUAL RESULT ENTRY): POC Glucose: 221 mg/dl — AB (ref 70–99)

## 2021-03-20 NOTE — Progress Notes (Signed)
Assessment & Plan:  Jon [name] was seen today for diabetes.  Diagnoses and all orders for this visit:  Type 2 diabetes mellitus without complication, without long-term current use of insulin (HCC) -     POCT glucose (manual entry) -     POCT glycosylated hemoglobin (Hb A1C)  Anemia, unspecified type -     CBC -     Iron, TIBC and Ferritin Panel  Primary hypertension Continue all antihypertensives as prescribed.  Remember to bring in your blood pressure log with you for your follow up appointment.  DASH/Mediterranean Diets are healthier choices for HTN.    Patient has been counseled on age-appropriate routine health concerns for screening and prevention. These are reviewed and up-to-date. Referrals have been placed accordingly. Immunizations are up-to-date or declined.    Subjective:   Chief Complaint  Patient presents with   Diabetes   Jon [name] 65 y.o. male presents to office today for follow-up to DM, HTN and HPL.  He has a past medical history of Carotid artery occlusion, DM2, History of kidney stones, Hyperlipidemia, Hypertension, Stomach ulcer, and Stroke     Diabetes type 2 He declines increasing his trulicity higher than the current dosage of 0.75 mg.  He endorses adherence to taking glimepiride 4 mg daily and metformin 1000 mg twice daily. Lab Results  Component Value Date   HGBA1C 7.2 (A) 03/20/2021    HTN Blood pressure is not well controlled today however he endorses medication adherence taking amlodipine 10 mg daily and losartan 50 mg daily. BP Readings from Last 3 Encounters:  03/20/21 (!) 148/80  01/05/21 117/83  12/19/20 132/70     Review of Systems  Constitutional:  Negative for fever, malaise/fatigue and weight loss.  HENT: Negative.  Negative for nosebleeds.   Eyes: Negative.  Negative for blurred vision, double vision and photophobia.  Respiratory: Negative.  Negative for cough and shortness of breath.   Cardiovascular: Negative.  Negative for  chest pain, palpitations and leg swelling.  Gastrointestinal: Negative.  Negative for heartburn, nausea and vomiting.  Musculoskeletal: Negative.  Negative for myalgias.  Neurological: Negative.  Negative for dizziness, focal weakness, seizures and headaches.  Psychiatric/Behavioral: Negative.  Negative for suicidal ideas.    Past Medical History:  Diagnosis Date   Carotid artery occlusion    Diabetes mellitus without complication ([place])    type II   History of kidney stones    Hyperlipidemia    Hypertension    Kidney stones    Stomach ulcer    Stroke [facility])    Dec. 14 2018    Past Surgical History:  Procedure Laterality Date   CYSTOSCOPY Left 12/10/2016   Procedure: cystoscopy with left ureteral stone extraction ;  Surgeon: Kathie Rhodes, MD;  Location: [facility];  Service: Urology;  Laterality: Left;   HERNIA REPAIR     Stomach ulcer      Family History  Problem Relation Age of Onset   Diabetes Father    Stroke Father    Hypertension Father    Hypertension Mother    Diabetes Sister    Diabetes Son    Diabetes Daughter     Social History Reviewed with no changes to be made today.   Outpatient Medications Prior to Visit  Medication Sig Dispense Refill   amLODipine (NORVASC) 10 MG tablet TAKE 1 TABLET BY MOUTH  DAILY 90 tablet 1   atorvastatin (LIPITOR) 80 MG tablet TAKE 1 TABLET BY MOUTH  DAILY 90 tablet 3  clopidogrel (PLAVIX) 75 MG tablet TAKE 1 TABLET BY MOUTH  DAILY 90 tablet 3   ezetimibe (ZETIA) 10 MG tablet Take 1 tablet (10 mg total) by mouth daily. 90 tablet 3   glimepiride (AMARYL) 4 MG tablet TAKE 2 TABLETS BY MOUTH  DAILY WITH BREAKFAST 120 tablet 2   glucose blood (ONETOUCH VERIO) test strip USE AS INSTRUCTED TO CHECK BLOOD SUGAR DAILY. 50 strip 3   losartan (COZAAR) 50 MG tablet Take 1 tablet (50 mg total) by mouth daily. 90 tablet 3   metFORMIN (GLUCOPHAGE) 1000 MG tablet TAKE 1 TABLET BY MOUTH  TWICE DAILY WITH MEALS 485 tablet 1   TRULICITY 4.62 VO/3.5KK  SOPN INJECT SUBCUTANEOUSLY 0.75  MG ONCE WEEKLY 6 mL 0   pantoprazole (PROTONIX) 40 MG tablet Take 1 tablet (40 mg total) by mouth daily. (Patient not taking: Reported on 03/20/2021) 30 tablet 1   chlorhexidine (PERIDEX) 0.12 % solution Swish with 15 ml by mouth twice a day for 30 seconds and spit all out. Do not swallow. Do not eat or drink for 30 minutes after use. (Patient not taking: Reported on 03/20/2021) 473 mL 0   No facility-administered medications prior to visit.    No Known Allergies     Objective:    BP (!) 148/80   Pulse 83   Ht 5\' 10"  (1.778 m)   Wt 190 lb 4 oz (86.3 kg)   SpO2 97%   BMI 27.30 kg/m  Wt Readings from Last 3 Encounters:  03/20/21 190 lb 4 oz (86.3 kg)  01/05/21 193 lb (87.5 kg)  12/19/20 192 lb 12.8 oz (87.5 kg)    Physical Exam Vitals and nursing note reviewed.  Constitutional:      Appearance: He is well-developed.  HENT:     Head: Normocephalic and atraumatic.  Cardiovascular:     Rate and Rhythm: Normal rate and regular rhythm.     Heart sounds: Normal heart sounds. No murmur heard.   No friction rub. No gallop.  Pulmonary:     Effort: Pulmonary effort is normal. No tachypnea or respiratory distress.     Breath sounds: Normal breath sounds. No decreased breath sounds, wheezing, rhonchi or rales.  Chest:     Chest wall: No tenderness.  Abdominal:     General: Bowel sounds are normal.     Palpations: Abdomen is soft.  Musculoskeletal:        General: Normal range of motion.     Cervical back: Normal range of motion.  Skin:    General: Skin is warm and dry.  Neurological:     Mental Status: He is alert and oriented to person, place, and time.     Coordination: Coordination normal.  Psychiatric:        Behavior: Behavior normal. Behavior is cooperative.        Thought Content: Thought content normal.        Judgment: Judgment normal.         Patient has been counseled extensively about nutrition and exercise as well as the  importance of adherence with medications and regular follow-up. The patient was given clear instructions to go to ER or return to medical center if symptoms don't improve, worsen or new problems develop. The patient verbalized understanding.   Follow-up: Return in about 3 months (around 06/20/2021).   Gildardo Pounds, FNP-BC [facility] and [place], [employer]   03/20/2021, 5:18 PM

## 2021-03-21 ENCOUNTER — Other Ambulatory Visit: Payer: Self-pay

## 2021-03-21 ENCOUNTER — Other Ambulatory Visit: Payer: Self-pay | Admitting: Nurse Practitioner

## 2021-03-21 DIAGNOSIS — D508 Other iron deficiency anemias: Secondary | ICD-10-CM

## 2021-03-21 LAB — CBC
Hematocrit: 37.9 % (ref 37.5–51.0)
Hemoglobin: 12.2 g/dL — ABNORMAL LOW (ref 13.0–17.7)
MCH: 24.9 pg — ABNORMAL LOW (ref 26.6–33.0)
MCHC: 32.2 g/dL (ref 31.5–35.7)
MCV: 77 fL — ABNORMAL LOW (ref 79–97)
Platelets: 258 10*3/uL (ref 150–450)
RBC: 4.9 x10E6/uL (ref 4.14–5.80)
RDW: 16.7 % — ABNORMAL HIGH (ref 11.6–15.4)
WBC: 5.1 10*3/uL (ref 3.4–10.8)

## 2021-03-21 LAB — IRON,TIBC AND FERRITIN PANEL
Ferritin: 20 ng/mL — ABNORMAL LOW (ref 30–400)
Iron Saturation: 11 % — ABNORMAL LOW (ref 15–55)
Iron: 39 ug/dL (ref 38–169)
Total Iron Binding Capacity: 370 ug/dL (ref 250–450)
UIBC: 331 ug/dL (ref 111–343)

## 2021-03-21 MED ORDER — FERROUS GLUCONATE 324 (38 FE) MG PO TABS
324.0000 mg | ORAL_TABLET | Freq: Every day | ORAL | 3 refills | Status: DC
  Filled 2021-03-21: qty 90, 90d supply, fill #0

## 2021-03-27 ENCOUNTER — Encounter (HOSPITAL_BASED_OUTPATIENT_CLINIC_OR_DEPARTMENT_OTHER): Payer: Self-pay | Admitting: Cardiovascular Disease

## 2021-03-27 ENCOUNTER — Ambulatory Visit (HOSPITAL_BASED_OUTPATIENT_CLINIC_OR_DEPARTMENT_OTHER): Payer: Medicare Other | Admitting: Cardiovascular Disease

## 2021-03-27 ENCOUNTER — Other Ambulatory Visit: Payer: Self-pay

## 2021-03-27 DIAGNOSIS — E785 Hyperlipidemia, unspecified: Secondary | ICD-10-CM | POA: Diagnosis not present

## 2021-03-27 DIAGNOSIS — I10 Essential (primary) hypertension: Secondary | ICD-10-CM

## 2021-03-27 DIAGNOSIS — I63332 Cerebral infarction due to thrombosis of left posterior cerebral artery: Secondary | ICD-10-CM | POA: Diagnosis not present

## 2021-03-27 NOTE — Assessment & Plan Note (Signed)
Blood pressure well-controlled both at home and in the office.  Continue amlodipine and losartan.

## 2021-03-27 NOTE — Patient Instructions (Signed)
Medication Instructions:  Your physician recommends that you continue on your current medications as directed. Please refer to the Current Medication list given to you today.   *If you need a refill on your cardiac medications before your next appointment, please call your pharmacy*  Lab Work: NONE  Testing/Procedures: NONE   Follow-Up: At [facility], you and your health needs are our priority.  As part of our continuing mission to provide you with exceptional heart care, we have created designated Provider Care Teams.  These Care Teams include your primary Cardiologist (physician) and Advanced Practice Providers (APPs -  Physician Assistants and Nurse Practitioners) who all work together to provide you with the care you need, when you need it.  We recommend signing up for the patient portal called "MyChart".  Sign up information is provided on this After Visit Summary.  MyChart is used to connect with patients for Virtual Visits (Telemedicine).  Patients are able to view lab/test results, encounter notes, upcoming appointments, etc.  Non-urgent messages can be sent to your provider as well.   To learn more about what you can do with MyChart, go to [email or web address].    Your next appointment:   6 month(s)  The format for your next appointment:   In Person  Provider:   Skeet Latch, MD   Other Instructions YOU HAVE BEEN REFERRED TO THE [employer] PREP PROGRAM IF YOU DO NOT HEAR FROM [facility] OR [place] IN 2 WEEKS PLEASE CALL THE OFFICE TO FOLLOW UP

## 2021-03-27 NOTE — Assessment & Plan Note (Signed)
Lipids well have been controlled on atorvastatin and Zetia.

## 2021-03-27 NOTE — Progress Notes (Signed)
Cardiology Office Note   Date:  03/27/2021   ID:  Jon [name], DOB Sep 25, 1955, MRN [id number]  PCP:  Gildardo Pounds, NP  Cardiologist:   Skeet Latch, MD   No chief complaint on file.     History of Present Illness: Jon [name] is a 65 y.o. male with nonobstructive CAD, mild ascending aortic aneurysm (4.0 cm) hypertension, hyperlipidemia, diabetes, carotid stenosis,and prior stroke here for follow-up.  He was first seen in the hospital 10/2020 with chest pain.  Cardiac enzymes were negative and EKG was without ischemic changes.  He was referred for coronary CTA 10/2020 with a calcium score 170, which was 80th percentile for age and gender.  He had mild plaque in the proximal LAD and otherwise minimal plaque.  His ascending aorta was mildly dilated to 4.0 cm.  That admission he was found to have a left thalamic infarct and was treated with aspirin and clopidogrel.  He followed up with Laurann Montana on 11/2020 and was doing well.  He was started on Zetia given that his lipids were not well-controlled.   He saw Ms. Fleming on 03/20/21 and his blood pressure was poorly controlled on amlodipine and losartan.  Since he was seen in the hospital he has been doing well.  He walks 3 days/week for about 3 miles at a time.  He has to take his time because he still feels weak from his stroke.  He has no exertional chest pain or shortness of breath.  He has no lower extremity edema, orthopnea, or PND.  His blood pressure at home has been well-controlled.  He notes that his diet has been very good.  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     Past Medical History:  Diagnosis Date   Carotid artery occlusion    Diabetes mellitus without complication ([place])    type II   History of kidney stones    Hyperlipidemia    Hypertension    Kidney stones    Stomach ulcer    Stroke [facility])    Dec. 14 2018    Past Surgical History:  Procedure Laterality Date   CYSTOSCOPY Left  12/10/2016   Procedure: cystoscopy with left ureteral stone extraction ;  Surgeon: Kathie Rhodes, MD;  Location: [facility];  Service: Urology;  Laterality: Left;   HERNIA REPAIR     Stomach ulcer       Current Outpatient Medications  Medication Sig Dispense Refill   amLODipine (NORVASC) 10 MG tablet TAKE 1 TABLET BY MOUTH  DAILY 90 tablet 1   atorvastatin (LIPITOR) 80 MG tablet TAKE 1 TABLET BY MOUTH  DAILY 90 tablet 3   clopidogrel (PLAVIX) 75 MG tablet TAKE 1 TABLET BY MOUTH  DAILY 90 tablet 3   ezetimibe (ZETIA) 10 MG tablet Take 1 tablet (10 mg total) by mouth daily. 90 tablet 3   ferrous gluconate (FERGON) 324 MG tablet Take 1 tablet (324 mg total) by mouth daily with breakfast. 90 tablet 3   glimepiride (AMARYL) 4 MG tablet TAKE 2 TABLETS BY MOUTH  DAILY WITH BREAKFAST 120 tablet 2   glucose blood (ONETOUCH VERIO) test strip USE AS INSTRUCTED TO CHECK BLOOD SUGAR DAILY. 50 strip 3   losartan (COZAAR) 50 MG tablet Take 1 tablet (50 mg total) by mouth daily. 90 tablet 3   metFORMIN (GLUCOPHAGE) 1000 MG tablet TAKE 1 TABLET BY MOUTH  TWICE DAILY WITH MEALS 180 tablet 1   pantoprazole (PROTONIX) 40 MG  tablet Take 1 tablet (40 mg total) by mouth daily. (Patient not taking: Reported on 03/20/2021) 30 tablet 1   TRULICITY 2.42 AS/3.4HD SOPN INJECT SUBCUTANEOUSLY 0.75  MG ONCE WEEKLY 6 mL 0   No current facility-administered medications for this visit.    Allergies:   Patient has no known allergies.    Social History:  The patient  reports that he has never smoked. He has never used smokeless tobacco. He reports current alcohol use of about 1.0 - 2.0 standard drink per week. He reports that he does not use drugs.   Family History:  The patient's family history includes Diabetes in his daughter, father, sister, and son; Hypertension in his father and mother; Stroke in his father.    ROS:  Please see the history of present illness.   Otherwise, review of systems are positive for none.   All  other systems are reviewed and negative.    PHYSICAL EXAM: VS:  There were no vitals taken for this visit. , BMI There is no height or weight on file to calculate BMI. GENERAL:  Well appearing HEENT:  Pupils equal round and reactive, fundi not visualized, oral mucosa unremarkable NECK:  No jugular venous distention, waveform within normal limits, carotid upstroke brisk and symmetric, no bruits, no thyromegaly LUNGS:  Clear to auscultation bilaterally HEART:  RRR.  PMI not displaced or sustained,S1 and S2 within normal limits, no S3, no S4, no clicks, no rubs, no murmurs ABD:  Flat, positive bowel sounds normal in frequency in pitch, no bruits, no rebound, no guarding, no midline pulsatile mass, no hepatomegaly, no splenomegaly EXT:  2 plus pulses throughout, no edema, no cyanosis no clubbing SKIN:  No rashes no nodules NEURO:  Cranial nerves II through XII grossly intact, motor grossly intact throughout PSYCH:  Cognitively intact, oriented to person place and time    EKG:  EKG is ordered today. The ekg ordered today demonstrates sinus rhythm.  Rate 92 bpm.  First degree AV block. Cannot rule out prior anterior infarct.    Recent Labs: 11/08/2020: Magnesium 2.1; TSH 2.105 03/13/2021: ALT 25; BUN 15; Creatinine, Ser 1.09; Potassium 4.9; Sodium 142 03/20/2021: Hemoglobin 12.2; Platelets 258    Lipid Panel    Component Value Date/Time   CHOL 102 03/13/2021 1437   TRIG 73 03/13/2021 1437   HDL 34 (L) 03/13/2021 1437   CHOLHDL 3.0 03/13/2021 1437   CHOLHDL 4.0 11/08/2020 1214   VLDL 8 11/08/2020 1214   LDLCALC 53 03/13/2021 1437      Wt Readings from Last 3 Encounters:  03/20/21 190 lb 4 oz (86.3 kg)  01/05/21 193 lb (87.5 kg)  12/19/20 192 lb 12.8 oz (87.5 kg)      ASSESSMENT AND PLAN:  CVA (cerebral vascular accident) ([place]) Secondary prevention risk factors are well-controlled.  Continue atorvastatin and clopidogrel.  HTN (hypertension) Blood pressure well-controlled  both at home and in the office.  Continue amlodipine and losartan.  Dyslipidemia Lipids well have been controlled on atorvastatin and Zetia.    Current medicines are reviewed at length with the patient today.  The patient does not have concerns regarding medicines.  The following changes have been made:  no change  Labs/ tests ordered today include:  No orders of the defined types were placed in this encounter.    Disposition:   FU with Zamantha Strebel C. Oval Linsey, MD, [facility] in 6 months.      Signed, Beverly Suriano C. Oval Linsey, MD, [facility]  03/27/2021 2:08 PM  [facility]

## 2021-03-27 NOTE — Assessment & Plan Note (Signed)
Secondary prevention risk factors are well-controlled.  Continue atorvastatin and clopidogrel.

## 2021-03-29 NOTE — Addendum Note (Signed)
Addended by: Vennie Homans on: 03/29/2021 08:25 AM   Modules accepted: Orders

## 2021-03-31 ENCOUNTER — Telehealth: Payer: Self-pay

## 2021-03-31 NOTE — Telephone Encounter (Signed)
Called to discuss PREP program, he is at work and wants to return my call on Monday

## 2021-04-03 ENCOUNTER — Telehealth: Payer: Self-pay

## 2021-04-03 NOTE — Telephone Encounter (Signed)
Called to discuss PREP program, he works Tues-Saturday 9:30-6, offered evening at [employer], but he want to come to [place]; he can do next 8am class at [place], most likely starting late January early February; will contact him after first of year once that class is scheduled/planned.

## 2021-04-17 ENCOUNTER — Other Ambulatory Visit: Payer: Self-pay | Admitting: Nurse Practitioner

## 2021-04-17 DIAGNOSIS — I10 Essential (primary) hypertension: Secondary | ICD-10-CM

## 2021-04-17 NOTE — Telephone Encounter (Signed)
Requested Prescriptions  Pending Prescriptions Disp Refills  . losartan (COZAAR) 50 MG tablet [Pharmacy Med Name: Losartan Potassium 50 MG Oral Tablet] 90 tablet 1    Sig: TAKE 1 TABLET BY MOUTH  DAILY     Cardiovascular:  Angiotensin Receptor Blockers Passed - 04/17/2021  2:12 PM      Passed - Cr in normal range and within 180 days    Creatinine, Ser  Date Value Ref Range Status  03/13/2021 1.09 0.76 - 1.27 mg/dL Final   Creatinine,U  Date Value Ref Range Status  06/08/2016 171.6 mg/dL Final         Passed - K in normal range and within 180 days    Potassium  Date Value Ref Range Status  03/13/2021 4.9 3.5 - 5.2 mmol/L Final         Passed - Patient is not pregnant      Passed - Last BP in normal range    BP Readings from Last 1 Encounters:  03/27/21 122/76         Passed - Valid encounter within last 6 months    Recent Outpatient Visits          4 weeks ago Type 2 diabetes mellitus without complication, without long-term current use of insulin ([place])   [place], [place] W, NP   3 months ago Type 2 diabetes mellitus without complication, without long-term current use of insulin ([place])   [place], Vernia Buff, NP   4 months ago Hospital discharge follow-up   [place] Gildardo Pounds, NP   7 months ago Primary hypertension   [place], Vernia Buff, NP   10 months ago Primary hypertension   [place], Vernia Buff, NP      Future Appointments            In 2 months Gildardo Pounds, NP [place]

## 2021-04-18 ENCOUNTER — Other Ambulatory Visit: Payer: Self-pay | Admitting: *Deleted

## 2021-04-18 MED ORDER — EZETIMIBE 10 MG PO TABS: 10.0000 mg | ORAL_TABLET | Freq: Every day | ORAL | 1 refills | Status: DC

## 2021-04-18 NOTE — Telephone Encounter (Signed)
Received fax request from [facility] for new prescription be sent to them for Ezetimibe 10 mg. This is Dr. Blenda Mounts patient.

## 2021-04-28 ENCOUNTER — Other Ambulatory Visit: Payer: Self-pay

## 2021-05-01 ENCOUNTER — Other Ambulatory Visit: Payer: Self-pay

## 2021-05-22 ENCOUNTER — Telehealth: Payer: Self-pay

## 2021-05-22 NOTE — Telephone Encounter (Signed)
Called to discuss PREP class schedule at [name], he wants to attend T/Th classes staring Jan 24, 8-9:15am for 12 weeks. Assessment visit scheduled for Jan 16 at 10am.

## 2021-05-29 ENCOUNTER — Telehealth: Payer: Self-pay | Admitting: Nurse Practitioner

## 2021-05-29 ENCOUNTER — Other Ambulatory Visit: Payer: Self-pay

## 2021-05-29 NOTE — Telephone Encounter (Signed)
Pt informed that paper her dropped off was ready. They have been faxed and a copy is in the front office.

## 2021-05-29 NOTE — Telephone Encounter (Signed)
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

## 2021-05-29 NOTE — Progress Notes (Signed)
[facility] PREP Evaluation  Patient Details  Name: Jon [name] MRN: [id number] Date of Birth: 1955/07/22 Age: 67 y.o. PCP: Gildardo Pounds, NP  Vitals:   05/29/21 1014  BP: 122/70  Pulse: 78  SpO2: 98%  Weight: 190 lb 9.6 oz (86.5 kg)     [facility] Eval - 05/29/21 1000       [facility] "PREP" Location   [facility] "PREP" Location [place]      Referral    Referring Provider Oval Linsey    Reason for referral Hypertension;High Cholesterol;Diabetes;Stroke    Program Start Date 06/06/21      Measurement   Neck measurement 17 Inches    Waist Circumference 37 inches      Information for Trainer   Goals --   Establish and incorporate cardio and strength training into life   Current Exercise none    Pertinent Medical History --   HTN, s/p CVA, diabetes   Current Barriers work      Timed Up and Go (TUGS)   Timed Up and Go Low risk <9 seconds      Mobility and Daily Activities   I find it easy to walk up or down two or more flights of stairs. 4    I have no trouble taking out the trash. 4    I do housework such as vacuuming and dusting on my own without difficulty. 4    I can easily lift a gallon of milk (8lbs). 4    I can easily walk a mile. 4    I have no trouble reaching into high cupboards or reaching down to pick up something from the floor. 4    I do not have trouble doing out-door work such as Armed forces logistics/support/administrative officer, raking leaves, or gardening. 4      Mobility and Daily Activities   I feel younger than my age. 4    I feel independent. 4    I feel energetic. 4    I live an active life.  2    I feel strong. 2    I feel healthy. 4    I feel active as other people my age. 4      How fit and strong are you.   Fit and Strong Total Score 52            Past Medical History:  Diagnosis Date   Carotid artery occlusion    Diabetes mellitus without complication ([place])    type II   History of kidney stones    Hyperlipidemia    Hypertension    Kidney stones    Stomach ulcer     Stroke [facility])    Dec. 14 2018   Past Surgical History:  Procedure Laterality Date   CYSTOSCOPY Left 12/10/2016   Procedure: cystoscopy with left ureteral stone extraction ;  Surgeon: Kathie Rhodes, MD;  Location[facility];  Service: Urology;  Laterality: Left;   HERNIA REPAIR     Stomach ulcer     Social History   Tobacco Use  Smoking Status Never  Smokeless Tobacco Never  To begin PREP classes on 06/06/21, every T/Th 8am-9:15 at [employer] 05/29/2021, 10:17 AM

## 2021-05-29 NOTE — Telephone Encounter (Signed)
Copied from [place] [id number]. Topic: General - Other >> May 29, 2021 10:24 AM Yvette Rack wrote: Reason for CRM: Pt stated he was returning call to [facility]. Pt requests call back

## 2021-05-29 NOTE — Telephone Encounter (Signed)
Patient returning "[name]" call but unsure what it's regarding.

## 2021-06-02 ENCOUNTER — Telehealth: Payer: Self-pay

## 2021-06-02 NOTE — Telephone Encounter (Signed)
Called pt and informed that the paper had been faxed and a copy is in the front office if pt would like to pick it up.

## 2021-06-06 NOTE — Progress Notes (Signed)
[facility] PREP Weekly Session  Patient Details  Name: Jon [name] MRN: [id number] Date of Birth: [birth year] Age: 66 y.o. PCP: Gildardo Pounds, NP  There were no vitals filed for this visit.   [facility] Weekly seesion - 06/06/21 1000       [facility] "PREP" Location   [facility] "PREP" Location [facility]      Weekly Session   Topic Discussed Goal setting and welcome to the program   Review of program/Prep handbook, introductions, tour of facility, option to work out on cardio machines   Classes attended to date [place] 06/06/2021, 10:53 AM

## 2021-06-11 ENCOUNTER — Other Ambulatory Visit: Payer: Self-pay | Admitting: Nurse Practitioner

## 2021-06-11 DIAGNOSIS — E119 Type 2 diabetes mellitus without complications: Secondary | ICD-10-CM

## 2021-06-12 NOTE — Telephone Encounter (Signed)
Requested medication (s) are due for refill today:   No  Requested medication (s) are on the active medication list:   Yes  Future visit scheduled:   Yes in 1 wk   Last ordered: 01/18/2021 #180, 1 refill  Returned because eGFR not within 360 days so not passed per protocol.   Requested Prescriptions  Pending Prescriptions Disp Refills   metFORMIN (GLUCOPHAGE) 1000 MG tablet [Pharmacy Med Name: metFORMIN HCl 1000 MG Oral Tablet] 180 tablet 3    Sig: TAKE 1 TABLET BY MOUTH  TWICE DAILY WITH MEALS     Endocrinology:  Diabetes - Biguanides Failed - 06/11/2021 11:10 PM      Failed - AA eGFR in normal range and within 360 days    GFR calc Af Amer  Date Value Ref Range Status  06/13/2020 100 >59 mL/min/1.73 Final    Comment:    **In accordance with recommendations from the NKF-ASN Task force,**   [facility] is in the process of updating its eGFR calculation to the   2021 CKD-EPI creatinine equation that estimates kidney function   without a race variable.    GFR, Estimated  Date Value Ref Range Status  11/08/2020 >60 >60 mL/min Final    Comment:    (NOTE) Calculated using the CKD-EPI Creatinine Equation (2021)    GFR  Date Value Ref Range Status  03/13/2017 95.90 >60.00 mL/min Final   eGFR  Date Value Ref Range Status  03/13/2021 74 >59 mL/min/1.73 Final          Passed - Cr in normal range and within 360 days    Creatinine, Ser  Date Value Ref Range Status  03/13/2021 1.09 0.76 - 1.27 mg/dL Final   Creatinine,U  Date Value Ref Range Status  06/08/2016 171.6 mg/dL Final          Passed - HBA1C is between 0 and 7.9 and within 180 days    Hemoglobin A1C  Date Value Ref Range Status  03/20/2021 7.2 (A) 4.0 - 5.6 % Final   HbA1c, POC (controlled diabetic range)  Date Value Ref Range Status  12/19/2020 6.7 0.0 - 7.0 % Final          Passed - Valid encounter within last 6 months    Recent Outpatient Visits           2 months ago Type 2 diabetes mellitus without  complication, without long-term current use of insulin ([place])   [place], [place] W, NP   5 months ago Type 2 diabetes mellitus without complication, without long-term current use of insulin [facility])   [place] Gildardo Pounds, NP   6 months ago Hospital discharge follow-up   [place], NP   9 months ago Primary hypertension   [place], Vernia Buff, NP   1 year ago Primary hypertension   [place], Vernia Buff, NP       Future Appointments             In 1 week Gildardo Pounds, NP [place]

## 2021-06-13 NOTE — Progress Notes (Signed)
[facility] PREP Weekly Session  Patient Details  Name: Jon [name] MRN: [id number] Date of Birth: May 09, 1956 Age: 66 y.o. PCP: Gildardo Pounds, NP  Vitals:   06/13/21 1017  Weight: 186 lb (84.4 kg)     [facility] Weekly seesion - 06/13/21 1000       [facility] "PREP" Location   [facility] "PREP" Location [facility]      Weekly Session   Topic Discussed Importance of resistance training;Other ways to be active   Sitting no more than 30 minutes; 150 mins/wk of cardio, stength training 2-3 x/wk for 20-40 mins; 1/2 body wt in oz of H2O (unless on fluid restriction), or at least 64 oz/day   Minutes exercised this week 50 minutes    Classes attended to date [place] 06/13/2021, 10:19 AM

## 2021-06-19 ENCOUNTER — Other Ambulatory Visit: Payer: Self-pay

## 2021-06-19 ENCOUNTER — Encounter: Payer: Self-pay | Admitting: Nurse Practitioner

## 2021-06-19 ENCOUNTER — Ambulatory Visit: Payer: Medicare Other | Attending: Nurse Practitioner | Admitting: Nurse Practitioner

## 2021-06-19 VITALS — BP 128/76 | HR 97 | Ht 70.0 in | Wt 189.5 lb

## 2021-06-19 DIAGNOSIS — I10 Essential (primary) hypertension: Secondary | ICD-10-CM | POA: Diagnosis not present

## 2021-06-19 DIAGNOSIS — D649 Anemia, unspecified: Secondary | ICD-10-CM

## 2021-06-19 DIAGNOSIS — E119 Type 2 diabetes mellitus without complications: Secondary | ICD-10-CM

## 2021-06-19 DIAGNOSIS — E785 Hyperlipidemia, unspecified: Secondary | ICD-10-CM | POA: Diagnosis not present

## 2021-06-19 LAB — POCT GLYCOSYLATED HEMOGLOBIN (HGB A1C): Hemoglobin A1C: 6.9 % — AB (ref 4.0–5.6)

## 2021-06-19 LAB — GLUCOSE, POCT (MANUAL RESULT ENTRY): POC Glucose: 153 mg/dl — AB (ref 70–99)

## 2021-06-19 NOTE — Progress Notes (Signed)
Assessment & Plan:  Jon [name] was seen today for diabetes.  Diagnoses and all orders for this visit:  Type 2 diabetes mellitus without complication, without long-term current use of insulin (HCC) -     POCT glycosylated hemoglobin (Hb A1C) -     POCT glucose (manual entry) -     CMP14+EGFR  Primary hypertension -     CMP14+EGFR  Dyslipidemia, goal LDL below 70  Anemia, unspecified type -     CBC with Differential -     Iron, TIBC and Ferritin Panel    Patient has been counseled on age-appropriate routine health concerns for screening and prevention. These are reviewed and up-to-date. Referrals have been placed accordingly. Immunizations are up-to-date or declined.    Subjective:   Chief Complaint  Patient presents with   Diabetes   Diabetes Pertinent negatives for hypoglycemia include no dizziness, headaches or seizures. Pertinent negatives for diabetes include no blurred vision, no chest pain and no weight loss.  Jon [name] 66 y.o. male presents to office today for follow up to HTN, DM and HPL. He has a past medical history of Carotid artery occlusion, DM2, History of kidney stones, Hyperlipidemia, Hypertension, Stomach ulcer, and Stroke   Working 1-2 days for at least 1hr  DM 2 Diabetes is well controlled. He is working out at [employer] under the [employer] 1-2 times per week at least an hour per session.   He endorses adherence to taking glimepiride 4 mg daily, Trulicity 7.28AS weekly, and metformin 1000 mg twice daily. LDL at goal with  atorvastatin 80 mg daily and zetia 10 mg daily. Weight is down.  Lab Results  Component Value Date   HGBA1C 6.9 (A) 06/19/2021    Lab Results  Component Value Date   HGBA1C 7.2 (A) 03/20/2021    Lab Results  Component Value Date   LDLCALC 53 03/13/2021    HTN Blood pressure is well controlled today and he endorses medication adherence taking amlodipine 10 mg daily and losartan 50 mg daily. BP Readings from Last 3  Encounters:  06/19/21 128/76  05/29/21 122/70  03/27/21 122/76    Review of Systems  Constitutional:  Negative for fever, malaise/fatigue and weight loss.  HENT: Negative.  Negative for nosebleeds.   Eyes: Negative.  Negative for blurred vision, double vision and photophobia.  Respiratory: Negative.  Negative for cough and shortness of breath.   Cardiovascular: Negative.  Negative for chest pain, palpitations and leg swelling.  Gastrointestinal: Negative.  Negative for heartburn, nausea and vomiting.  Musculoskeletal: Negative.  Negative for myalgias.  Neurological: Negative.  Negative for dizziness, focal weakness, seizures and headaches.  Psychiatric/Behavioral: Negative.  Negative for suicidal ideas.    Past Medical History:  Diagnosis Date   Carotid artery occlusion    Diabetes mellitus without complication ([place])    type II   History of kidney stones    Hyperlipidemia    Hypertension    Kidney stones    Stomach ulcer    Stroke [facility])    Dec. 14 2018    Past Surgical History:  Procedure Laterality Date   CYSTOSCOPY Left 12/10/2016   Procedure: cystoscopy with left ureteral stone extraction ;  Surgeon: Kathie Rhodes, MD;  [facility];  Service: Urology;  Laterality: Left;   HERNIA REPAIR     Stomach ulcer      Family History  Problem Relation Age of Onset   Diabetes Father    Stroke Father    Hypertension  Father    Hypertension Mother    Diabetes Sister    Diabetes Son    Diabetes Daughter     Social History Reviewed with no changes to be made today.   Outpatient Medications Prior to Visit  Medication Sig Dispense Refill   amLODipine (NORVASC) 10 MG tablet TAKE 1 TABLET BY MOUTH  DAILY 90 tablet 1   atorvastatin (LIPITOR) 80 MG tablet TAKE 1 TABLET BY MOUTH  DAILY 90 tablet 3   clopidogrel (PLAVIX) 75 MG tablet TAKE 1 TABLET BY MOUTH  DAILY 90 tablet 3   ezetimibe (ZETIA) 10 MG tablet Take 1 tablet (10 mg total) by mouth daily. 90 tablet 1   glimepiride  (AMARYL) 4 MG tablet TAKE 2 TABLETS BY MOUTH  DAILY WITH BREAKFAST 120 tablet 2   glucose blood (ONETOUCH VERIO) test strip USE AS INSTRUCTED TO CHECK BLOOD SUGAR DAILY. 50 strip 3   losartan (COZAAR) 50 MG tablet TAKE 1 TABLET BY MOUTH  DAILY 90 tablet 1   metFORMIN (GLUCOPHAGE) 1000 MG tablet TAKE 1 TABLET BY MOUTH  TWICE DAILY WITH MEALS 502 tablet 1   TRULICITY 7.74 JO/8.7OM SOPN INJECT SUBCUTANEOUSLY 0.75  MG ONCE WEEKLY 6 mL 0   ferrous gluconate (FERGON) 324 MG tablet Take 1 tablet (324 mg total) by mouth daily with breakfast. (Patient not taking: Reported on 06/19/2021) 90 tablet 3   No facility-administered medications prior to visit.    No Known Allergies     Objective:    BP 128/76    Pulse 97    Ht _0  (1.778 m)    Wt 189 lb 8 oz (86 kg)    SpO2 96%    BMI 27.19 kg/m  Wt Readings from Last 3 Encounters:  06/19/21 189 lb 8 oz (86 kg)  06/13/21 186 lb (84.4 kg)  05/29/21 190 lb 9.6 oz (86.5 kg)    Physical Exam Vitals and nursing note reviewed.  Constitutional:      Appearance: He is well-developed.  HENT:     Head: Normocephalic and atraumatic.  Cardiovascular:     Rate and Rhythm: Normal rate and regular rhythm.     Heart sounds: Normal heart sounds. No murmur heard.   No friction rub. No gallop.  Pulmonary:     Effort: Pulmonary effort is normal. No tachypnea or respiratory distress.     Breath sounds: Normal breath sounds. No decreased breath sounds, wheezing, rhonchi or rales.  Chest:     Chest wall: No tenderness.  Abdominal:     General: Bowel sounds are normal.     Palpations: Abdomen is soft.  Musculoskeletal:        General: Normal range of motion.     Cervical back: Normal range of motion.  Skin:    General: Skin is warm and dry.  Neurological:     Mental Status: He is alert and oriented to person, place, and time.     Coordination: Coordination normal.  Psychiatric:        Behavior: Behavior normal. Behavior is cooperative.        Thought  Content: Thought content normal.        Judgment: Judgment normal.         Patient has been counseled extensively about nutrition and exercise as well as the importance of adherence with medications and regular follow-up. The patient was given clear instructions to go to ER or return to medical center if symptoms don't improve, worsen or new problems  develop. The patient verbalized understanding.   Follow-up: Return in about 3 months (around 09/16/2021).   Gildardo Pounds, FNP-BC [facility] [place], [place]   06/19/2021, 3:33 PM

## 2021-06-20 LAB — CMP14+EGFR
ALT: 16 IU/L (ref 0–44)
AST: 15 IU/L (ref 0–40)
Albumin/Globulin Ratio: 1.7 (ref 1.2–2.2)
Albumin: 4.7 g/dL (ref 3.8–4.8)
Alkaline Phosphatase: 87 IU/L (ref 44–121)
BUN/Creatinine Ratio: 11 (ref 10–24)
BUN: 11 mg/dL (ref 8–27)
Bilirubin Total: 0.6 mg/dL (ref 0.0–1.2)
CO2: 24 mmol/L (ref 20–29)
Calcium: 9.4 mg/dL (ref 8.6–10.2)
Chloride: 102 mmol/L (ref 96–106)
Creatinine, Ser: 1.04 mg/dL (ref 0.76–1.27)
Globulin, Total: 2.8 g/dL (ref 1.5–4.5)
Glucose: 136 mg/dL — ABNORMAL HIGH (ref 70–99)
Potassium: 4.8 mmol/L (ref 3.5–5.2)
Sodium: 140 mmol/L (ref 134–144)
Total Protein: 7.5 g/dL (ref 6.0–8.5)
eGFR: 79 mL/min/1.73 (ref >59)

## 2021-06-20 LAB — IRON,TIBC AND FERRITIN PANEL
Ferritin: 19 ng/mL — ABNORMAL LOW (ref 30–400)
Iron Saturation: 9 % — CL (ref 15–55)
Iron: 37 ug/dL — ABNORMAL LOW (ref 38–169)
Total Iron Binding Capacity: 405 ug/dL (ref 250–450)
UIBC: 368 ug/dL — ABNORMAL HIGH (ref 111–343)

## 2021-06-20 LAB — CBC WITH DIFFERENTIAL/PLATELET
Basophils Absolute: 0.1 x10E3/uL (ref 0.0–0.2)
Basos: 1 %
EOS (ABSOLUTE): 0.2 x10E3/uL (ref 0.0–0.4)
Eos: 4 %
Hematocrit: 41.3 % (ref 37.5–51.0)
Hemoglobin: 13.1 g/dL (ref 13.0–17.7)
Immature Grans (Abs): 0 x10E3/uL (ref 0.0–0.1)
Immature Granulocytes: 1 %
Lymphocytes Absolute: 1.3 x10E3/uL (ref 0.7–3.1)
Lymphs: 24 %
MCH: 24.7 pg — ABNORMAL LOW (ref 26.6–33.0)
MCHC: 31.7 g/dL (ref 31.5–35.7)
MCV: 78 fL — ABNORMAL LOW (ref 79–97)
Monocytes Absolute: 0.4 x10E3/uL (ref 0.1–0.9)
Monocytes: 7 %
Neutrophils Absolute: 3.5 x10E3/uL (ref 1.4–7.0)
Neutrophils: 63 %
Platelets: 288 x10E3/uL (ref 150–450)
RBC: 5.31 x10E6/uL (ref 4.14–5.80)
RDW: 16.5 % — ABNORMAL HIGH (ref 11.6–15.4)
WBC: 5.5 x10E3/uL (ref 3.4–10.8)

## 2021-06-22 ENCOUNTER — Telehealth: Payer: Self-pay | Admitting: *Deleted

## 2021-06-22 NOTE — Telephone Encounter (Signed)
Received surgical clearance from [place] re: extractions/surgery. Completed and signed by Fara Olden NP. Faxed to [employer]. Received confirmation.

## 2021-06-27 ENCOUNTER — Other Ambulatory Visit: Payer: Self-pay | Admitting: Nurse Practitioner

## 2021-06-27 ENCOUNTER — Encounter: Payer: Self-pay | Admitting: Nurse Practitioner

## 2021-06-27 DIAGNOSIS — D508 Other iron deficiency anemias: Secondary | ICD-10-CM

## 2021-06-27 MED ORDER — FERROUS GLUCONATE 324 (38 FE) MG PO TABS: 324.0000 mg | ORAL_TABLET | Freq: Every day | ORAL | 3 refills | Status: DC

## 2021-06-27 NOTE — Progress Notes (Signed)
[facility] PREP Weekly Session  Patient Details  Name: Jon [name] MRN: [id number] Date of Birth: November 07, 1955 Age: 66 y.o. PCP: Gildardo Pounds, NP  Vitals:   06/27/21 1111  Weight: 188 lb (85.3 kg)     [facility] Weekly seesion - 06/27/21 1100       [facility] "PREP" Location   [facility] "PREP" Location [facility]      Weekly Session   Topic Discussed Healthy eating tips   Encouraged using [employer] app   Minutes exercised this week 60 minutes    Classes attended to date [place] 06/27/2021, 11:12 AM

## 2021-07-04 IMAGING — DX DG CHEST 2V
2 series · 2 of 2 positions shown · non-contrast
Comparison: 08/05/2017

CLINICAL DATA: Chest pain

EXAM:
CHEST - 2 VIEW

[chest pa]
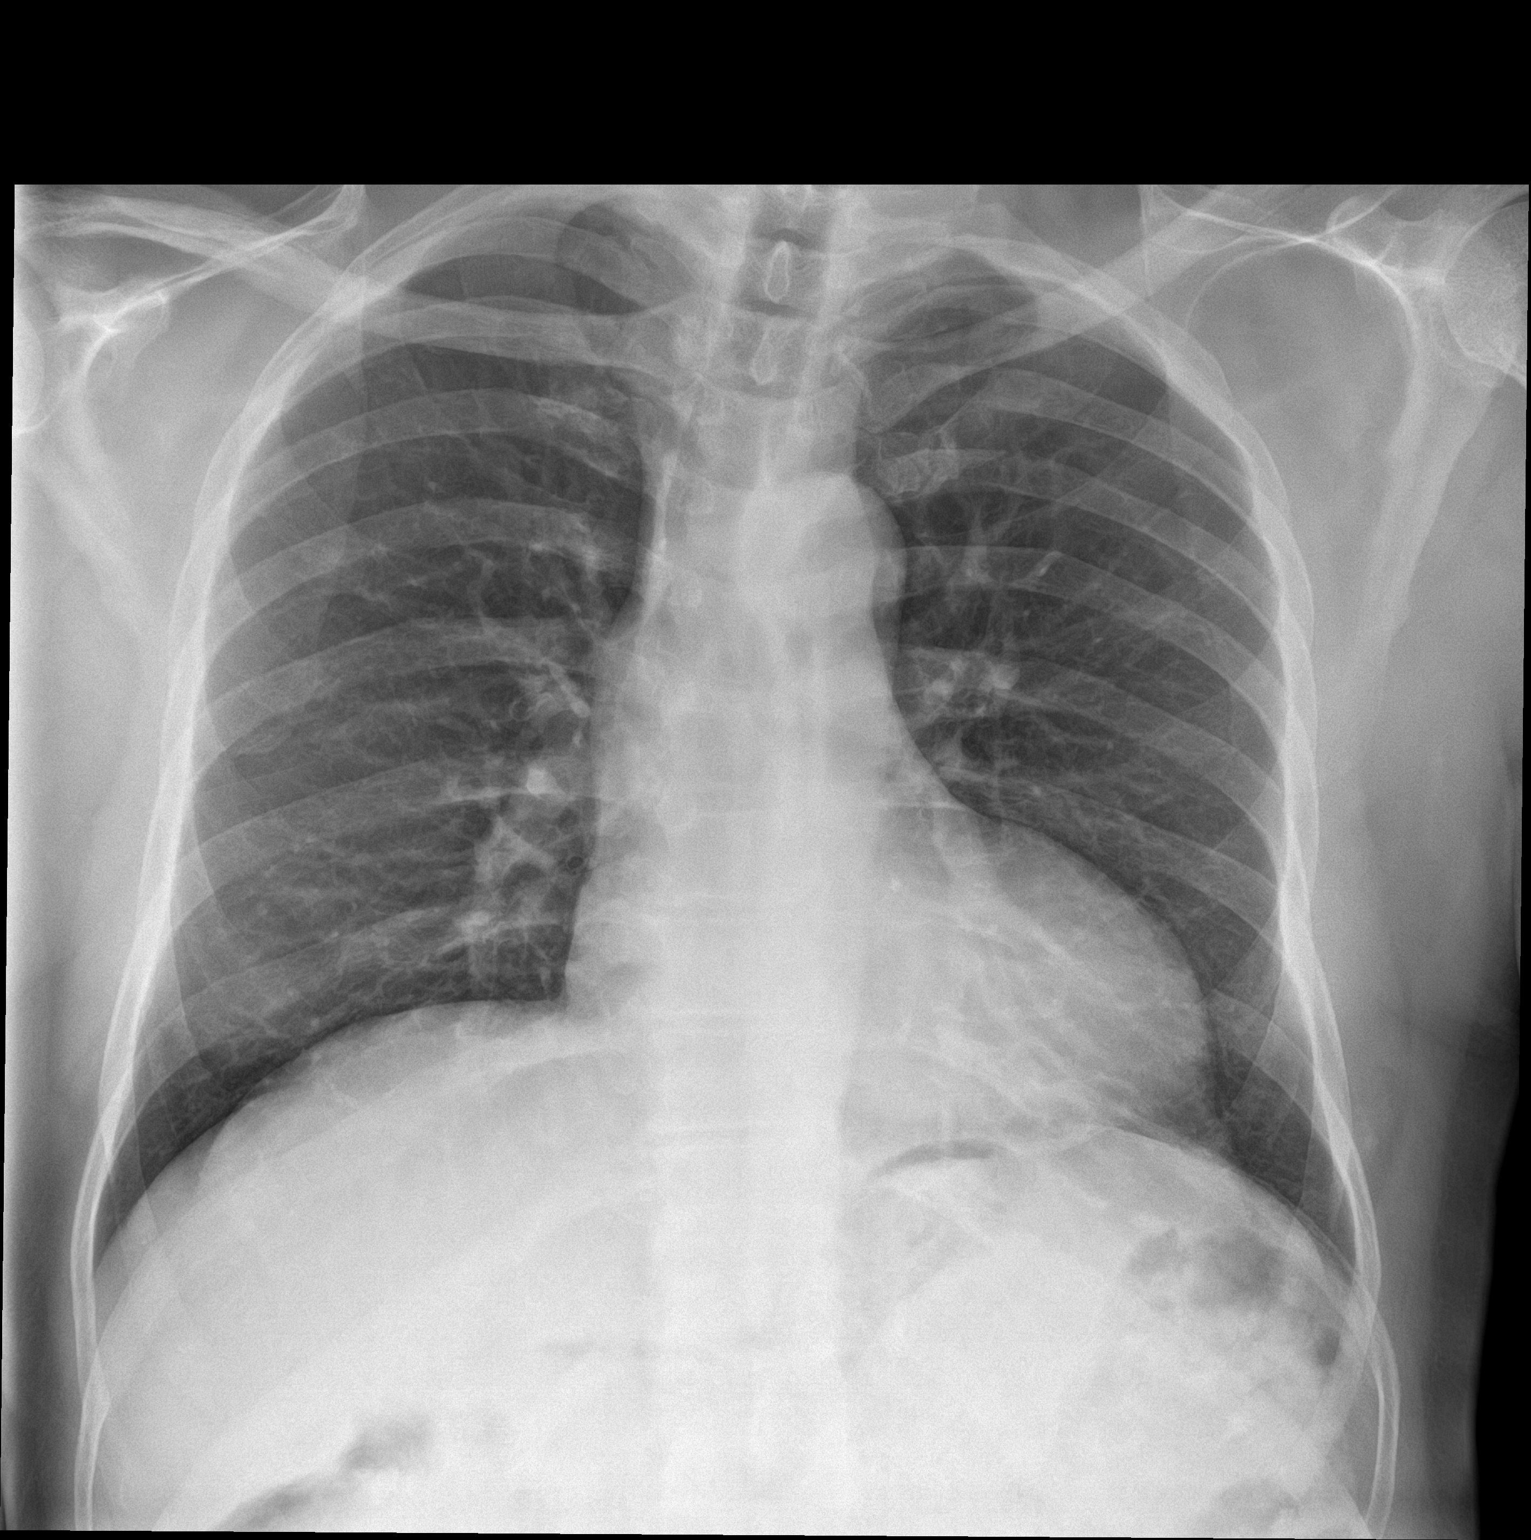

[chest lat]
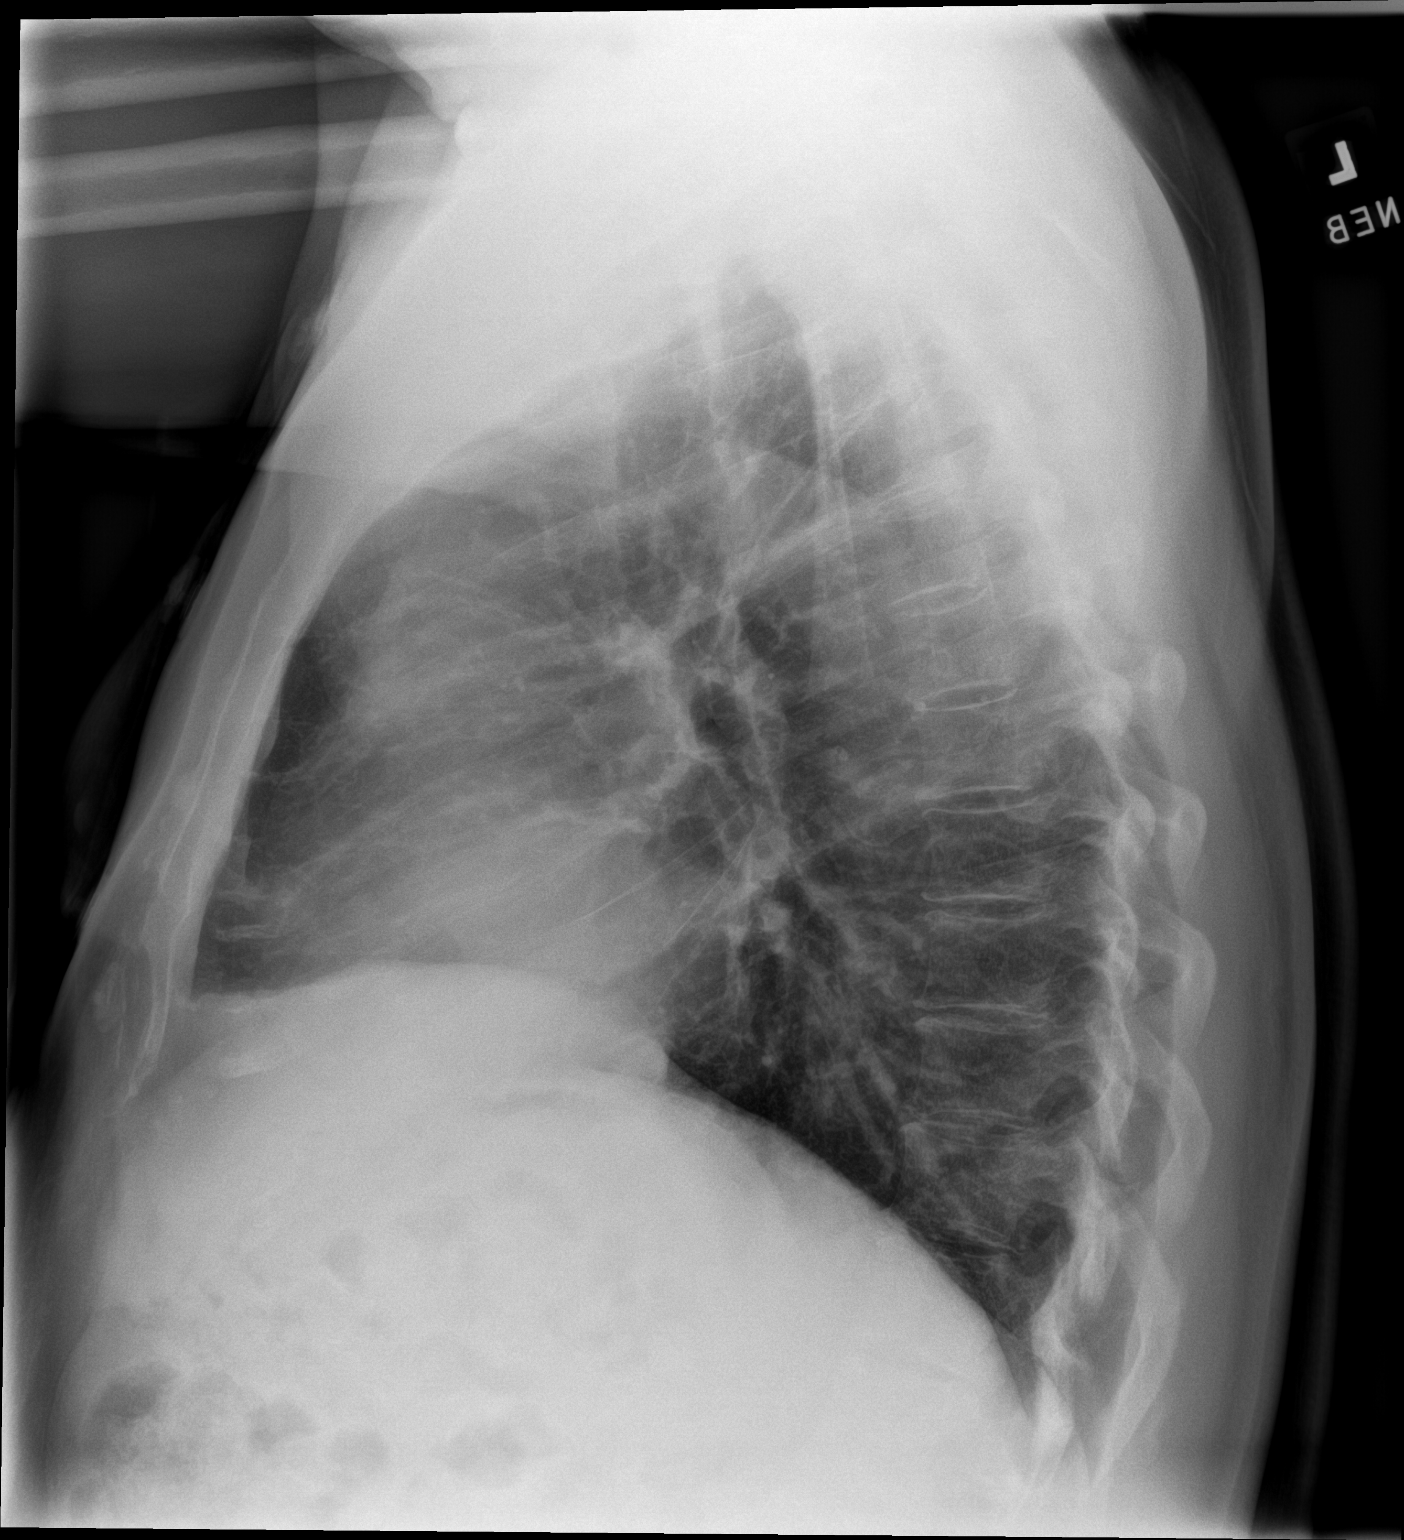

[2 of 2 positions shown; findings below may reference images not displayed]

FINDINGS: No consolidation, features of edema, pneumothorax, or effusion.
Pulmonary vascularity is normally distributed. The cardiomediastinal
contours are unremarkable. No acute osseous or soft tissue
abnormality.
IMPRESSION: No acute cardiopulmonary abnormality.

## 2021-07-04 NOTE — Progress Notes (Signed)
[facility] PREP Weekly Session  Patient Details  Name: Jon [name] MRN: [id number] Date of Birth: 1955-12-17 Age: 66 y.o. PCP: Gildardo Pounds, NP  Vitals:   07/04/21 0940  Weight: 188 lb (85.3 kg)     [facility] Weekly seesion - 07/04/21 0900       [facility] "PREP" Location   [facility] "PREP" Location [facility]      Weekly Session   Topic Discussed Health habits   Sugar demo   Minutes exercised this week 70 minutes    Classes attended to date [place] 07/04/2021, 9:40 AM

## 2021-07-11 NOTE — Progress Notes (Signed)
[facility] PREP Weekly Session  Patient Details  Name: Jon [name] MRN: [id number] Date of Birth: [birth year] Age: 66 y.o. PCP: Gildardo Pounds, NP  Vitals:   07/11/21 1353  Weight: 186 lb (84.4 kg)     [facility] Weekly seesion - 07/11/21 1300       [facility] "PREP" Location   [facility] "PREP" Location [facility]      Weekly Session   Topic Discussed Restaurant Eating   Salt demo; limit Na intake 1500-2300 mg/day   Minutes exercised this week 85 minutes    Classes attended to date [place] 07/11/2021, 1:54 PM

## 2021-07-14 ENCOUNTER — Other Ambulatory Visit: Payer: Self-pay | Admitting: Nurse Practitioner

## 2021-07-14 DIAGNOSIS — I63332 Cerebral infarction due to thrombosis of left posterior cerebral artery: Secondary | ICD-10-CM

## 2021-07-14 DIAGNOSIS — I10 Essential (primary) hypertension: Secondary | ICD-10-CM

## 2021-07-14 DIAGNOSIS — E119 Type 2 diabetes mellitus without complications: Secondary | ICD-10-CM

## 2021-07-14 MED ORDER — LOSARTAN POTASSIUM 50 MG PO TABS: 50.0000 mg | ORAL_TABLET | Freq: Every day | ORAL | 1 refills | Status: DC

## 2021-07-14 MED ORDER — AMLODIPINE BESYLATE 10 MG PO TABS: 10.0000 mg | ORAL_TABLET | Freq: Every day | ORAL | 1 refills | Status: DC

## 2021-07-14 MED ORDER — GLIMEPIRIDE 4 MG PO TABS: 8.0000 mg | ORAL_TABLET | Freq: Every day | ORAL | 1 refills | Status: DC

## 2021-07-14 MED ORDER — TRULICITY 0.75 MG/0.5ML ~~LOC~~ SOAJ: 0.7500 mg | SUBCUTANEOUS | 1 refills | Status: AC

## 2021-07-14 MED ORDER — ATORVASTATIN CALCIUM 80 MG PO TABS: 80.0000 mg | ORAL_TABLET | Freq: Every day | ORAL | 3 refills | Status: DC

## 2021-07-14 MED ORDER — CLOPIDOGREL BISULFATE 75 MG PO TABS: 75.0000 mg | ORAL_TABLET | Freq: Every day | ORAL | 3 refills | Status: DC

## 2021-07-18 NOTE — Progress Notes (Signed)
[facility] PREP Weekly Session ? ?Patient Details  ?Name: Jon [name] ?MRN: [id number] ?Date of Birth: Oct 06, 1955 ?Age: 66 y.o. ?PCP: Gildardo Pounds, NP ? ?Vitals:  ? 07/18/21 1054  ?Weight: 187 lb (84.8 kg)  ? ? ? [facility] Weekly seesion - 07/18/21 1000   ? ?  ? [facility] "PREP" Location  ? [facility]PREP[facility] Location [facility]   ?  ? Weekly Session  ? Topic Discussed Stress management and problem solving   finger tip breathwork; healthy fruit bowl meditation  ? Minutes exercised this week 150 minutes   ? Classes attended to date 17   ? ?  ?  ? ?  ? ? ?[place] ?07/18/2021, 10:55 AM ? ? ?

## 2021-07-25 ENCOUNTER — Other Ambulatory Visit: Payer: Self-pay

## 2021-07-25 MED ORDER — AMOXICILLIN 500 MG PO CAPS
ORAL_CAPSULE | ORAL | 0 refills | Status: DC
  Filled 2021-07-25: qty 21, 7d supply, fill #0

## 2021-07-25 MED ORDER — CHLORHEXIDINE GLUCONATE 0.12 % MT SOLN
OROMUCOSAL | 1 refills | Status: DC
Start: 2021-07-25 — End: 2021-10-30
  Filled 2021-07-25: qty 250, 5d supply, fill #0
  Filled 2021-07-25: qty 473, 20d supply, fill #0

## 2021-07-25 MED ORDER — OXYCODONE-ACETAMINOPHEN 5-325 MG PO TABS: ORAL_TABLET | ORAL | 0 refills | Status: DC

## 2021-07-25 MED ORDER — IBUPROFEN 600 MG PO TABS
ORAL_TABLET | ORAL | 1 refills | Status: DC
Start: 2021-07-25 — End: 2021-10-30
  Filled 2021-07-25: qty 20, 5d supply, fill #0

## 2021-08-14 ENCOUNTER — Other Ambulatory Visit: Payer: Self-pay

## 2021-08-15 NOTE — Progress Notes (Signed)
[facility] PREP Weekly Session ? ?Patient Details  ?Name: Jon [name] ?MRN: [id number] ?Date of Birth: 05-Sep-1955 ?Age: 66 y.o. ?PCP: Gildardo Pounds, NP ? ?Vitals:  ? 08/15/21 0955  ?Weight: 189 lb (85.7 kg)  ? ? ? [facility] Weekly seesion - 08/15/21 0900   ? ?  ? [facility] "PREP" Location  ? [facility] "PREP[facility] Location [facility]   ?  ? Weekly Session  ? Topic Discussed Other   Portion Control, label review, visualize your portion size demo  ? Minutes exercised this week 240 minutes   ? Classes attended to date 11   ? ?  ?  ? ?  ? ? ?[place] ?08/15/2021, 9:59 AM ? ? ?

## 2021-08-20 ENCOUNTER — Other Ambulatory Visit: Payer: Self-pay | Admitting: Family Medicine

## 2021-08-20 ENCOUNTER — Other Ambulatory Visit: Payer: Self-pay | Admitting: Nurse Practitioner

## 2021-08-20 DIAGNOSIS — E119 Type 2 diabetes mellitus without complications: Secondary | ICD-10-CM

## 2021-09-05 NOTE — Progress Notes (Signed)
[facility] PREP Weekly Session ? ?Patient Details  ?Name: Jon [name] ?MRN: [id number] ?Date of Birth: [birth year] ?Age: 66 y.o. ?PCP: Gildardo Pounds, NP ? ?Vitals:  ? 09/05/21 1153  ?Weight: 188 lb (85.3 kg)  ? ? ? [facility] Weekly seesion - 09/05/21 1100   ? ?  ? [facility] "PREP" Location  ? [facility]PREP[facility]   ?  ? Weekly Session  ? Topic Discussed Other   Fit testing completed; reviewed goals and activity plan for next 3 months, how fit and strong survey completed; finall assessment visit scheduled for Thursday morning  ? Minutes exercised this week 330 minutes   ? Classes attended to date 38   ? ?  ?  ? ?  ? ? ?[place] ?09/05/2021, 11:54 AM ? ? ?

## 2021-09-07 NOTE — Progress Notes (Signed)
[facility] PREP Evaluation ? ?Patient Details  ?Name: Jon [name] ?MRN: [id number] ?Date of Birth: 1956/01/09 ?Age: 66 y.o. ?PCP: Gildardo Pounds, NP ? ?Vitals:  ? 09/07/21 1049  ?BP: 122/60  ?Pulse: 76  ?SpO2: 99%  ?Weight: 188 lb (85.3 kg)  ? ? ? [facility] Eval - 09/07/21 1000   ? ?  ? [facility] "PREP" Location  ? [facility]   ?  ? Referral   ? Referring Provider Oval Linsey   ? Program Start Date 09/07/21   program end date  ?  ? Measurement  ? Neck measurement 17 Inches   ? Hip Circumference 37 inches   ? Body fat 28.6 percent   ?  ? Mobility and Daily Activities  ? I find it easy to walk up or down two or more flights of stairs. 4   ? I have no trouble taking out the trash. 4   ? I do housework such as vacuuming and dusting on my own without difficulty. 4   ? I can easily lift a gallon of milk (8lbs). 4   ? I can easily walk a mile. 4   ? I have no trouble reaching into high cupboards or reaching down to pick up something from the floor. 4   ? I do not have trouble doing out-door work such as Armed forces logistics/support/administrative officer, raking leaves, or gardening. 4   ?  ? Mobility and Daily Activities  ? I feel younger than my age. 3   ? I feel independent. 4   ? I feel energetic. 4   ? I live an active life.  3   ? I feel strong. 3   ? I feel healthy. 3   ? I feel active as other people my age. 4   ?  ? How fit and strong are you.  ? Fit and Strong Total Score 52   ? ?  ?  ? ?  ? ?Past Medical History:  ?Diagnosis Date  ? Carotid artery occlusion   ? Diabetes mellitus without complication ([place])   ? type II  ? History of kidney stones   ? Hyperlipidemia   ? Hypertension   ? Kidney stones   ? Stomach ulcer   ? Stroke [facility])   ? Dec. 14 2018  ? ?Past Surgical History:  ?Procedure Laterality Date  ? CYSTOSCOPY Left 12/10/2016  ? Procedure: cystoscopy with left ureteral stone extraction ;  Surgeon: Kathie Rhodes, MD;  [facility];  Service: Urology;  Laterality: Left;  ? HERNIA REPAIR    ? Stomach ulcer    ? ?Social History   ? ?Tobacco Use  ?Smoking Status Never  ?Smokeless Tobacco Never  ?Workouts completed: 11 ?Educations sessions attended: 8 ? ?[place] ?09/07/2021, 10:51 AM ? ? ?

## 2021-09-08 ENCOUNTER — Other Ambulatory Visit: Payer: Self-pay | Admitting: *Deleted

## 2021-09-08 DIAGNOSIS — I6522 Occlusion and stenosis of left carotid artery: Secondary | ICD-10-CM

## 2021-09-18 ENCOUNTER — Ambulatory Visit: Payer: Medicare Other | Admitting: Nurse Practitioner

## 2021-09-18 NOTE — Progress Notes (Signed)
?HISTORY AND PHYSICAL  ? ? ? ?CC:  follow up. ?Requesting Provider:  Gildardo Pounds, NP ? ?HPI: This is a 66 y.o. male here for follow up for carotid artery stenosis.  She has been followed by Dr. Trula Slade and was seen when she had an episode while driving when he was aware that his foot was on the accelerator but when it came time to slow down his brain cannot process putting his foot on the brake.  This lasted a second or so.  He did not seek medical attention.  He also reports possible vision loss for 2 to 3 seconds which completely resolved.  Stroke work-up revealed chronic left sided infarct secondary to small vessel disease source.  He was found to have a small little outpouching in his left carotid artery for which he is referred for vascular input.  ? ?Pt was last seen 09/05/2020 and at that time he was not having any neurologic sx, claudication, chest pain or back pain.  Dr. Trula Slade reviewed his CTA and found that his left ICA was unchanged at <50% stenosis of the proximal ICA and re-demonstrated small focal outpouching near the left ICA origin, which may reflect a focus of ulcerated plaque. His right common and ICA were patent without stenosis.  He did have hx of left brain stroke but felt this was most likely secondary to intracranial disease.   On his work-up he was found to have a small focal outpouching at the origin of his left internal carotid artery.  Dr. Trula Slade did not think that this was the source for his stroke.  He was scheduled for one year follow up.  ? ?Pt returns today for follow up.   ? ?Pt denies any amaurosis fugax, speech difficulties, weakness, numbness, paralysis or clumsiness or facial droop.  He denies any claudication or non healing wounds. He is compliant with his plavix and statin. ? ?The pt is on a statin for cholesterol management.  ?The pt is not on a daily aspirin.   Other AC:  Plavix ?The pt is on ARB for hypertension.   ?The pt does  have diabetes ?Tobacco hx:   never ? ?Pt does not have family hx of AAA. ? ?Past Medical History:  ?Diagnosis Date  ? Carotid artery occlusion   ? Diabetes mellitus without complication ([place])   ? type II  ? History of kidney stones   ? Hyperlipidemia   ? Hypertension   ? Kidney stones   ? Stomach ulcer   ? Stroke [facility])   ? Dec. 14 2018  ? ? ?Past Surgical History:  ?Procedure Laterality Date  ? CYSTOSCOPY Left 12/10/2016  ? Procedure: cystoscopy with left ureteral stone extraction ;  Surgeon: Kathie Rhodes, MD;  Location: [facility];  Service: Urology;  Laterality: Left;  ? HERNIA REPAIR    ? Stomach ulcer    ? ? ?No Known Allergies ? ?Current Outpatient Medications  ?Medication Sig Dispense Refill  ? amLODipine (NORVASC) 10 MG tablet Take 1 tablet (10 mg total) by mouth daily. 90 tablet 1  ? amoxicillin (AMOXIL) 500 MG capsule take 1 capsule by mouth three times a day x 7 days 21 capsule 0  ? atorvastatin (LIPITOR) 80 MG tablet Take 1 tablet (80 mg total) by mouth daily. 90 tablet 3  ? chlorhexidine (PERIDEX) 0.12 % solution Gently Swish and spit after every meal with 40m starting on the 2nd day after surgery Then on 5th day use in syringe for targeted  rinsing 250 mL 1  ? clopidogrel (PLAVIX) 75 MG tablet Take 1 tablet (75 mg total) by mouth daily. 90 tablet 3  ? Dulaglutide (TRULICITY) 4.16 SA/6.3KZ SOPN Inject 0.75 mg into the skin once a week. 6 mL 1  ? ezetimibe (ZETIA) 10 MG tablet Take 1 tablet (10 mg total) by mouth daily. 90 tablet 1  ? ferrous gluconate (FERGON) 324 MG tablet Take 1 tablet (324 mg total) by mouth daily with breakfast. 90 tablet 3  ? glimepiride (AMARYL) 4 MG tablet TAKE 2 TABLETS BY MOUTH DAILY  WITH BREAKFAST 180 tablet 0  ? glucose blood (ONETOUCH VERIO) test strip USE AS INSTRUCTED TO CHECK BLOOD SUGAR DAILY. 50 strip 3  ? ibuprofen (ADVIL) 600 MG tablet Take 1 tablet ('600mg'$ ) by mouth every 6 hours for 5 days 20 tablet 1  ? losartan (COZAAR) 50 MG tablet Take 1 tablet (50 mg total) by mouth daily. 90 tablet 1  ?  metFORMIN (GLUCOPHAGE) 1000 MG tablet TAKE 1 TABLET BY MOUTH TWICE  DAILY WITH MEALS 180 tablet 0  ? oxyCODONE-acetaminophen (PERCOCET/ROXICET) 5-325 MG tablet Take 1 tablet every 4-6 hours as needed for pain 12 tablet 0  ? ?No current facility-administered medications for this visit.  ? ? ?Family History  ?Problem Relation Age of Onset  ? Diabetes Father   ? Stroke Father   ? Hypertension Father   ? Hypertension Mother   ? Diabetes Sister   ? Diabetes Son   ? Diabetes Daughter   ? ? ?Social History  ? ?Socioeconomic History  ? Marital status: Divorced  ?  Spouse name: Not on file  ? Number of children: 4  ? Years of education: 14  ? Highest education level: Not on file  ?Occupational History  ? Occupation: [employer] driver  ?Tobacco Use  ? Smoking status: Never  ? Smokeless tobacco: Never  ?Vaping Use  ? Vaping Use: Never used  ?Substance and Sexual Activity  ? Alcohol use: Yes  ?  Alcohol/week: 1.0 - 2.0 standard drink  ?  Types: 1 - 2 Glasses of wine per week  ?  Comment: socially  ? Drug use: No  ? Sexual activity: Not Currently  ?Other Topics Concern  ? Not on file  ?Social History Narrative  ? Fun: Politics  ? ?Social Determinants of Health  ? ?Financial Resource Strain: Not on file  ?Food Insecurity: Not on file  ?Transportation Needs: Not on file  ?Physical Activity: Not on file  ?Stress: Not on file  ?Social Connections: Not on file  ?Intimate Partner Violence: Not on file  ? ? ? ?REVIEW OF SYSTEMS:  ? ?'[X]'$  denotes positive finding, '[ ]'$  denotes negative finding ?Cardiac  Comments:  ?Chest pain or chest pressure:    ?Shortness of breath upon exertion:    ?Short of breath when lying flat:    ?Irregular heart rhythm:    ?    ?Vascular    ?Pain in calf, thigh, or hip brought on by ambulation:    ?Pain in feet at night that wakes you up from your sleep:     ?Blood clot in your veins:    ?Leg swelling:     ?    ?Pulmonary    ?Oxygen at home:    ?Productive cough:     ?Wheezing:     ?    ?Neurologic    ?Sudden weakness  in arms or legs:     ?Sudden numbness in arms or legs:     ?  Sudden onset of difficulty speaking or slurred speech:    ?Temporary loss of vision in one eye:     ?Problems with dizziness:     ?    ?Gastrointestinal    ?Blood in stool:     ?Vomited blood:     ?    ?Genitourinary    ?Burning when urinating:     ?Blood in urine:    ?    ?Psychiatric    ?Major depression:     ?    ?Hematologic    ?Bleeding problems:    ?Problems with blood clotting too easily:    ?    ?Skin    ?Rashes or ulcers:    ?    ?Constitutional    ?Fever or chills:    ? ? ?PHYSICAL EXAMINATION: ? ?Today's Vitals  ? 09/25/21 1057 09/25/21 1100  ?BP: (!) 141/85 127/77  ?Pulse: 71 71  ?Resp: 16   ?Temp: (!) 97.2 ?F (36.2 ?C)   ?TempSrc: Temporal   ?SpO2: 98%   ?Weight: 186 lb (84.4 kg)   ?Height: '5\' 10"'$  (1.778 m)   ? ?Body mass index is 26.69 kg/m?. ? ? ?General:  WDWN in NAD; vital signs documented above ?Gait: Not observed ?HENT: WNL, normocephalic ?Pulmonary: normal non-labored breathing ?Cardiac: regular HR, without carotid bruits ?Abdomen: soft, NT; aortic pulse is not palpable ?Skin: without rashes ?Vascular Exam/Pulses: ? Right Left  ?Radial 2+ (normal) 2+ (normal)  ?DP 2+ (normal) 2+ (normal)  ? ?Extremities: without ischemic changes, without Gangrene , without cellulitis; without open wounds ?Musculoskeletal: no muscle wasting or atrophy  ?Neurologic: A&O X 3; moving all extremities equally; speech is fluent/normal ?Psychiatric:  The pt has Normal affect. ? ? ?Non-Invasive Vascular Imaging:   ?Carotid Duplex on 09/25/2021 ? ?Right:  1-39% ICA stenosis ?Left:  normal ICA  ?Vertebrals:  Bilateral vertebral arteries demonstrate antegrade flow.  ?Subclavians: Normal flow hemodynamics were seen in bilateral subclavian  arteries.  ? ? ? ? ?ASSESSMENT/PLAN:: 66 y.o. male here for follow up with hx of small focal outpouching at the origin of his left internal carotid artery.  Dr. Trula Slade did not think that this was the source for his stroke.  He was  scheduled for one year follow up.  ? ?-duplex today reveals 1-39% right ICA stenosis and normal left ICA ?-discussed s/s of stroke with pt and he understands should he develop any of these sx, he will go to the [facility]

## 2021-09-25 ENCOUNTER — Ambulatory Visit (HOSPITAL_COMMUNITY)
Admission: RE | Admit: 2021-09-25 | Discharge: 2021-09-25 | Disposition: A | Discharge status: Home / Self Care | Payer: Medicare Other | Source: Ambulatory Visit | Attending: Surgery | Admitting: Surgery

## 2021-09-25 ENCOUNTER — Ambulatory Visit: Payer: Medicare Other | Admitting: Physician Assistant

## 2021-09-25 VITALS — BP 127/77 | HR 71 | Temp 97.2°F | Resp 16 | Ht 70.0 in | Wt 186.0 lb

## 2021-09-25 DIAGNOSIS — I6522 Occlusion and stenosis of left carotid artery: Secondary | ICD-10-CM

## 2021-10-12 ENCOUNTER — Other Ambulatory Visit: Payer: Self-pay | Admitting: Nurse Practitioner

## 2021-10-12 ENCOUNTER — Other Ambulatory Visit: Payer: Self-pay

## 2021-10-12 DIAGNOSIS — E119 Type 2 diabetes mellitus without complications: Secondary | ICD-10-CM

## 2021-10-12 MED ORDER — ONETOUCH VERIO VI STRP
ORAL_STRIP | 1 refills | Status: DC
  Filled 2021-10-12: qty 50, 50d supply, fill #0
  Filled 2021-12-01: qty 50, 50d supply, fill #1

## 2021-10-13 ENCOUNTER — Other Ambulatory Visit: Payer: Self-pay

## 2021-10-16 ENCOUNTER — Other Ambulatory Visit: Payer: Self-pay

## 2021-10-28 ENCOUNTER — Other Ambulatory Visit: Payer: Self-pay | Admitting: Cardiovascular Disease

## 2021-10-30 ENCOUNTER — Ambulatory Visit: Payer: Medicare Other | Attending: Nurse Practitioner | Admitting: Nurse Practitioner

## 2021-10-30 ENCOUNTER — Other Ambulatory Visit: Payer: Self-pay | Admitting: Nurse Practitioner

## 2021-10-30 ENCOUNTER — Encounter: Payer: Self-pay | Admitting: Nurse Practitioner

## 2021-10-30 VITALS — BP 145/82 | HR 93 | Temp 98.3°F | Resp 16 | Ht 70.0 in | Wt 192.0 lb

## 2021-10-30 DIAGNOSIS — I10 Essential (primary) hypertension: Secondary | ICD-10-CM

## 2021-10-30 DIAGNOSIS — D508 Other iron deficiency anemias: Secondary | ICD-10-CM

## 2021-10-30 DIAGNOSIS — E119 Type 2 diabetes mellitus without complications: Secondary | ICD-10-CM | POA: Diagnosis not present

## 2021-10-30 LAB — GLUCOSE, POCT (MANUAL RESULT ENTRY): POC Glucose: 115 mg/dl — AB (ref 70–99)

## 2021-10-30 LAB — POCT GLYCOSYLATED HEMOGLOBIN (HGB A1C): HbA1c, POC (prediabetic range): 6.4 % (ref 5.7–6.4)

## 2021-10-30 MED ORDER — FREESTYLE LIBRE 2 SENSOR MISC: 6 refills | Status: DC

## 2021-10-30 MED ORDER — FREESTYLE LIBRE 2 READER DEVI: 0 refills | Status: DC

## 2021-10-30 NOTE — Progress Notes (Signed)
Assessment & Plan:  Jon [name] was seen today for diabetes and back pain.  Diagnoses and all orders for this visit:  Type 2 diabetes mellitus without complication, without long-term current use of insulin (HCC) -     Glucose (CBG) -     HgB A1c -     Discontinue: Continuous Blood Gluc Receiver (FREESTYLE LIBRE 2 READER) DEVI; Use as instructed. Check blood glucose level by fingerstick three times per day.   E11.65 -     Continuous Blood Gluc Sensor (FREESTYLE LIBRE 2 SENSOR) MISC; Use as instructed. Check blood glucose level by fingerstick three times per day. -     Continuous Blood Gluc Receiver (FREESTYLE LIBRE 2 READER) DEVI; Use as instructed. Check blood glucose level by fingerstick three times per day.  E11.65 -     CMP14+EGFR  Primary hypertension Continue amlodipine and losartan as prescribed.   Other iron deficiency anemia -     CBC -     Iron, TIBC and Ferritin Panel    Patient has been counseled on age-appropriate routine health concerns for screening and prevention. These are reviewed and up-to-date. Referrals have been placed accordingly. Immunizations are up-to-date or declined.    Subjective:   Chief Complaint  Patient presents with   Diabetes   Back Pain   [name] 66 y.o. male presents to office today for follow up to DM. He has a past medical history of Carotid artery occlusion, DM 2, History of kidney stones, Hyperlipidemia, Hypertension, Kidney stones, Stomach ulcer, and Stroke (taking plavix daily).    Back Pain Endorses intermittent Low back pain. Takes OTC tylenol for pain when 6/10. Aggravating factors: prolonged sitting which he tries to avoid. Denies any symptoms of urine or bowel incontinence.   HTN Blood pressure is slightly elevated today. Will not make any changes with BP meds. Continue on losartan 50 mg and amlodipine 10 mg daily.  BP Readings from Last 3 Encounters:  10/30/21 (!) 145/82  09/25/21 127/77  09/07/21 122/60      DM2 Well controlled with metformin 1000 mg BID and glimepiride 8,mg daily. LDL at goal with atorvastatin 80 mg daily and zetia 10 mg daily.  Lab Results  Component Value Date   HGBA1C 6.4 10/30/2021    Lab Results  Component Value Date   LDLCALC 53 03/13/2021     Review of Systems  Constitutional:  Negative for fever, malaise/fatigue and weight loss.  HENT: Negative.  Negative for nosebleeds.   Eyes: Negative.  Negative for blurred vision, double vision and photophobia.  Respiratory: Negative.  Negative for cough and shortness of breath.   Cardiovascular: Negative.  Negative for chest pain, palpitations and leg swelling.  Gastrointestinal: Negative.  Negative for heartburn, nausea and vomiting.  Musculoskeletal: Negative.  Negative for myalgias.  Neurological: Negative.  Negative for dizziness, focal weakness, seizures and headaches.  Psychiatric/Behavioral: Negative.  Negative for suicidal ideas.     Past Medical History:  Diagnosis Date   Carotid artery occlusion    Diabetes mellitus without complication ([place])    type II   History of kidney stones    Hyperlipidemia    Hypertension    Kidney stones    Stomach ulcer    Stroke [facility])    Dec. 14 2018    Past Surgical History:  Procedure Laterality Date   CYSTOSCOPY Left 12/10/2016   Procedure: cystoscopy with left ureteral stone extraction ;  Surgeon: Kathie Rhodes, MD;  Location[facility];  Service: Urology;  Laterality: Left;   HERNIA REPAIR     Stomach ulcer      Family History  Problem Relation Age of Onset   Diabetes Father    Stroke Father    Hypertension Father    Hypertension Mother    Diabetes Sister    Diabetes Son    Diabetes Daughter     Social History Reviewed with no changes to be made today.   Outpatient Medications Prior to Visit  Medication Sig Dispense Refill   amLODipine (NORVASC) 10 MG tablet Take 1 tablet (10 mg total) by mouth daily. 90 tablet 1   atorvastatin (LIPITOR) 80 MG tablet  Take 1 tablet (80 mg total) by mouth daily. 90 tablet 3   clopidogrel (PLAVIX) 75 MG tablet Take 1 tablet (75 mg total) by mouth daily. 90 tablet 3   glimepiride (AMARYL) 4 MG tablet TAKE 2 TABLETS BY MOUTH DAILY  WITH BREAKFAST 180 tablet 0   glucose blood (ONETOUCH VERIO) test strip USE AS INSTRUCTED TO CHECK BLOOD SUGAR DAILY. 50 strip 1   metFORMIN (GLUCOPHAGE) 1000 MG tablet TAKE 1 TABLET BY MOUTH TWICE  DAILY WITH MEALS 180 tablet 0   amoxicillin (AMOXIL) 500 MG capsule take 1 capsule by mouth three times a day x 7 days (Patient not taking: Reported on 09/25/2021) 21 capsule 0   chlorhexidine (PERIDEX) 0.12 % solution Gently Swish and spit after every meal with 83mL starting on the 2nd day after surgery Then on 5th day use in syringe for targeted rinsing (Patient not taking: Reported on 09/25/2021) 250 mL 1   ezetimibe (ZETIA) 10 MG tablet Take 1 tablet (10 mg total) by mouth daily. 90 tablet 1   ferrous gluconate (FERGON) 324 MG tablet Take 1 tablet (324 mg total) by mouth daily with breakfast. (Patient not taking: Reported on 09/25/2021) 90 tablet 3   ibuprofen (ADVIL) 600 MG tablet Take 1 tablet ($RemoveB'600mg'sAdYrUNd$ ) by mouth every 6 hours for 5 days (Patient not taking: Reported on 09/25/2021) 20 tablet 1   losartan (COZAAR) 50 MG tablet Take 1 tablet (50 mg total) by mouth daily. 90 tablet 1   oxyCODONE-acetaminophen (PERCOCET/ROXICET) 5-325 MG tablet Take 1 tablet every 4-6 hours as needed for pain (Patient not taking: Reported on 09/25/2021) 12 tablet 0   No facility-administered medications prior to visit.    No Known Allergies     Objective:    BP (!) 145/82 (BP Location: Left Arm, Patient Position: Sitting, Cuff Size: Normal)   Pulse 93   Temp 98.3 F (36.8 C) (Oral)   Resp 16   Ht $R'5\' 10"'jL$  (1.778 m)   Wt 192 lb (87.1 kg)   SpO2 97%   BMI 27.55 kg/m  Wt Readings from Last 3 Encounters:  10/30/21 192 lb (87.1 kg)  09/25/21 186 lb (84.4 kg)  09/07/21 188 lb (85.3 kg)    Physical  Exam Vitals and nursing note reviewed.  Constitutional:      Appearance: He is well-developed.  HENT:     Head: Normocephalic and atraumatic.  Cardiovascular:     Rate and Rhythm: Normal rate and regular rhythm.     Heart sounds: Normal heart sounds. No murmur heard.    No friction rub. No gallop.  Pulmonary:     Effort: Pulmonary effort is normal. No tachypnea or respiratory distress.     Breath sounds: Normal breath sounds. No decreased breath sounds, wheezing, rhonchi or rales.  Chest:     Chest wall: No tenderness.  Abdominal:     General: Bowel sounds are normal.     Palpations: Abdomen is soft.  Musculoskeletal:        General: Normal range of motion.     Cervical back: Normal range of motion.  Skin:    General: Skin is warm and dry.  Neurological:     Mental Status: He is alert and oriented to person, place, and time.     Coordination: Coordination normal.  Psychiatric:        Behavior: Behavior normal. Behavior is cooperative.        Thought Content: Thought content normal.        Judgment: Judgment normal.          Patient has been counseled extensively about nutrition and exercise as well as the importance of adherence with medications and regular follow-up. The patient was given clear instructions to go to ER or return to medical center if symptoms don't improve, worsen or new problems develop. The patient verbalized understanding.   Follow-up: Return in about 3 months (around 01/30/2022).   Gildardo Pounds, FNP-BC [facility], [place]   10/30/2021, 9:57 PM

## 2021-10-30 NOTE — Progress Notes (Signed)
F/u DM  Back pain- intermittent, 6/10  Better with muscle relaxer

## 2021-10-30 NOTE — Telephone Encounter (Signed)
Requested Prescriptions  Pending Prescriptions Disp Refills  . losartan (COZAAR) 50 MG tablet [Pharmacy Med Name: Losartan Potassium 50 MG Oral Tablet] 90 tablet 1    Sig: TAKE 1 TABLET BY MOUTH DAILY     Cardiovascular:  Angiotensin Receptor Blockers Passed - 10/30/2021  9:38 AM      Passed - Cr in normal range and within 180 days    Creatinine, Ser  Date Value Ref Range Status  06/19/2021 1.04 0.76 - 1.27 mg/dL Final   Creatinine,U  Date Value Ref Range Status  06/08/2016 171.6 mg/dL Final         Passed - K in normal range and within 180 days    Potassium  Date Value Ref Range Status  06/19/2021 4.8 3.5 - 5.2 mmol/L Final         Passed - Patient is not pregnant      Passed - Last BP in normal range    BP Readings from Last 1 Encounters:  10/30/21 (!) 145/82         Passed - Valid encounter within last 6 months    Recent Outpatient Visits          Today Type 2 diabetes mellitus without complication, without long-term current use of insulin ([place])   [place], [place] W, NP   4 months ago Type 2 diabetes mellitus without complication, without long-term current use of insulin ([place])   [place], [place] W, NP   7 months ago Type 2 diabetes mellitus without complication, without long-term current use of insulin [facility])   [place], [place] W, NP   10 months ago Type 2 diabetes mellitus without complication, without long-term current use of insulin [facility])   [place], [place] Buff, NP   11 months ago Hospital discharge follow-up   [place] Gildardo Pounds, NP

## 2021-10-30 NOTE — Telephone Encounter (Signed)
Rx request sent to pharmacy.  

## 2021-10-31 LAB — CBC
Hematocrit: 37.7 % (ref 37.5–51.0)
Hemoglobin: 12.1 g/dL — ABNORMAL LOW (ref 13.0–17.7)
MCH: 25.3 pg — ABNORMAL LOW (ref 26.6–33.0)
MCHC: 32.1 g/dL (ref 31.5–35.7)
MCV: 79 fL (ref 79–97)
Platelets: 312 10*3/uL (ref 150–450)
RBC: 4.79 x10E6/uL (ref 4.14–5.80)
RDW: 16.4 % — ABNORMAL HIGH (ref 11.6–15.4)
WBC: 4.9 10*3/uL (ref 3.4–10.8)

## 2021-10-31 LAB — CMP14+EGFR
ALT: 22 IU/L (ref 0–44)
AST: 18 IU/L (ref 0–40)
Albumin/Globulin Ratio: 1.7 (ref 1.2–2.2)
Albumin: 4.7 g/dL (ref 3.8–4.8)
Alkaline Phosphatase: 84 IU/L (ref 44–121)
BUN/Creatinine Ratio: 10 (ref 10–24)
BUN: 9 mg/dL (ref 8–27)
Bilirubin Total: 0.6 mg/dL (ref 0.0–1.2)
CO2: 23 mmol/L (ref 20–29)
Calcium: 9.3 mg/dL (ref 8.6–10.2)
Chloride: 105 mmol/L (ref 96–106)
Creatinine, Ser: 0.9 mg/dL (ref 0.76–1.27)
Globulin, Total: 2.8 g/dL (ref 1.5–4.5)
Glucose: 98 mg/dL (ref 70–99)
Potassium: 4.4 mmol/L (ref 3.5–5.2)
Sodium: 144 mmol/L (ref 134–144)
Total Protein: 7.5 g/dL (ref 6.0–8.5)
eGFR: 94 mL/min/{1.73_m2} (ref >59)

## 2021-10-31 LAB — IRON,TIBC AND FERRITIN PANEL
Ferritin: 14 ng/mL — ABNORMAL LOW (ref 30–400)
Iron Saturation: 9 % — CL (ref 15–55)
Iron: 42 ug/dL (ref 38–169)
Total Iron Binding Capacity: 448 ug/dL (ref 250–450)
UIBC: 406 ug/dL — ABNORMAL HIGH (ref 111–343)

## 2021-11-01 ENCOUNTER — Other Ambulatory Visit: Payer: Self-pay | Admitting: Nurse Practitioner

## 2021-11-01 ENCOUNTER — Telehealth: Payer: Self-pay | Admitting: Internal Medicine

## 2021-11-01 DIAGNOSIS — D509 Iron deficiency anemia, unspecified: Secondary | ICD-10-CM

## 2021-11-01 NOTE — Telephone Encounter (Signed)
Scheduled appt per 6/21 referral. Pt is aware of appt date and time. Pt is aware to arrive 15 mins prior to appt time and to bring and updated insurance card. Pt is aware of appt location.   

## 2021-11-12 ENCOUNTER — Other Ambulatory Visit: Payer: Self-pay | Admitting: Nurse Practitioner

## 2021-11-13 ENCOUNTER — Other Ambulatory Visit: Payer: Self-pay | Admitting: Family Medicine

## 2021-11-13 DIAGNOSIS — E119 Type 2 diabetes mellitus without complications: Secondary | ICD-10-CM

## 2021-11-16 ENCOUNTER — Telehealth: Payer: Self-pay | Admitting: Hematology and Oncology

## 2021-11-16 NOTE — Telephone Encounter (Signed)
R/s pt's new hem appt. Pt is aware of new appt date and time.  °

## 2021-11-20 DIAGNOSIS — H524 Presbyopia: Secondary | ICD-10-CM | POA: Diagnosis not present

## 2021-11-20 DIAGNOSIS — Z961 Presence of intraocular lens: Secondary | ICD-10-CM | POA: Diagnosis not present

## 2021-11-20 DIAGNOSIS — E113293 Type 2 diabetes mellitus with mild nonproliferative diabetic retinopathy without macular edema, bilateral: Secondary | ICD-10-CM | POA: Diagnosis not present

## 2021-11-22 ENCOUNTER — Encounter: Payer: Medicare Other | Admitting: Internal Medicine

## 2021-11-22 ENCOUNTER — Other Ambulatory Visit: Payer: Medicare Other

## 2021-12-01 ENCOUNTER — Other Ambulatory Visit: Payer: Self-pay

## 2021-12-06 ENCOUNTER — Inpatient Hospital Stay: Payer: Medicare Other | Attending: Internal Medicine | Admitting: Hematology and Oncology

## 2021-12-06 ENCOUNTER — Other Ambulatory Visit: Payer: Self-pay

## 2021-12-06 VITALS — BP 152/81 | HR 98 | Temp 97.5°F | Resp 18 | Ht 70.0 in | Wt 192.1 lb

## 2021-12-06 DIAGNOSIS — D508 Other iron deficiency anemias: Secondary | ICD-10-CM

## 2021-12-06 DIAGNOSIS — E611 Iron deficiency: Secondary | ICD-10-CM | POA: Diagnosis not present

## 2021-12-06 DIAGNOSIS — D509 Iron deficiency anemia, unspecified: Secondary | ICD-10-CM | POA: Insufficient documentation

## 2021-12-06 NOTE — Progress Notes (Signed)
[facility] CONSULT NOTE  Patient Care Team: Gildardo Pounds, NP as PCP - General (Nurse Practitioner) Skeet Latch, MD as PCP - Cardiology (Cardiology)  CHIEF COMPLAINTS/PURPOSE OF CONSULTATION:  Iron deficiency anemia  HISTORY OF PRESENTING ILLNESS:  Jon [name] 66 y.o. male is here because of recent diagnosis of iron deficiency.  He has had longstanding history of low iron levels without any clear-cut anemia.  He had upper endoscopy and colonoscopy earlier in the year which was negative.  He eats a normal diet and takes in plenty of iron-containing foods. He does not complain of any symptoms of iron deficiency like fatigue or shortness of breath or dizziness or palpitations.  I reviewed her records extensively and collaborated the history with the patient.  SUMMARY OF ONCOLOGIC HISTORY: Oncology History   No history exists.     MEDICAL HISTORY:  Past Medical History:  Diagnosis Date   Carotid artery occlusion    Diabetes mellitus without complication ([place])    type II   History of kidney stones    Hyperlipidemia    Hypertension    Kidney stones    Stomach ulcer    Stroke [facility])    Dec. 14 2018    SURGICAL HISTORY: Past Surgical History:  Procedure Laterality Date   CYSTOSCOPY Left 12/10/2016   Procedure: cystoscopy with left ureteral stone extraction ;  Surgeon: Kathie Rhodes, MD;  Location: [facility];  Service: Urology;  Laterality: Left;   HERNIA REPAIR     Stomach ulcer      SOCIAL HISTORY: Social History   Socioeconomic History   Marital status: Divorced    Spouse name: Not on file   Number of children: 4   Years of education: 16   Highest education level: Not on file  Occupational History   Occupation: [employer] driver  Tobacco Use   Smoking status: Never   Smokeless tobacco: Never  Vaping Use   Vaping Use: Never used  Substance and Sexual Activity   Alcohol use: Yes    Alcohol/week: 1.0 - 2.0 standard drink of alcohol    Types: 1 -  2 Glasses of wine per week    Comment: socially   Drug use: No   Sexual activity: Not Currently  Other Topics Concern   Not on file  Social History Narrative   Fun: Politics   Social Determinants of Health   Financial Resource Strain: Not on file  Food Insecurity: Not on file  Transportation Needs: Not on file  Physical Activity: Not on file  Stress: Not on file  Social Connections: Not on file  Intimate Partner Violence: Not on file    FAMILY HISTORY: Family History  Problem Relation Age of Onset   Diabetes Father    Stroke Father    Hypertension Father    Hypertension Mother    Diabetes Sister    Diabetes Son    Diabetes Daughter     ALLERGIES:  has No Known Allergies.  MEDICATIONS:  Current Outpatient Medications  Medication Sig Dispense Refill   amLODipine (NORVASC) 10 MG tablet TAKE 1 TABLET BY MOUTH DAILY 90 tablet 1   atorvastatin (LIPITOR) 80 MG tablet Take 1 tablet (80 mg total) by mouth daily. 90 tablet 3   clopidogrel (PLAVIX) 75 MG tablet Take 1 tablet (75 mg total) by mouth daily. 90 tablet 3   Continuous Blood Gluc Receiver (FREESTYLE LIBRE 2 READER) DEVI Use as instructed. Check blood glucose level by fingerstick three times per day.  E11.65 1 each 0   Continuous Blood Gluc Sensor (FREESTYLE LIBRE 2 SENSOR) MISC Use as instructed. Check blood glucose level by fingerstick three times per day. 1 each 6   ezetimibe (ZETIA) 10 MG tablet TAKE 1 TABLET BY MOUTH DAILY 90 tablet 3   glimepiride (AMARYL) 4 MG tablet TAKE 2 TABLETS BY MOUTH DAILY  WITH BREAKFAST 180 tablet 0   glucose blood (ONETOUCH VERIO) test strip USE AS INSTRUCTED TO CHECK BLOOD SUGAR DAILY. 50 strip 1   losartan (COZAAR) 50 MG tablet TAKE 1 TABLET BY MOUTH DAILY 90 tablet 1   metFORMIN (GLUCOPHAGE) 1000 MG tablet TAKE 1 TABLET BY MOUTH TWICE  DAILY WITH MEALS 180 tablet 1   No current facility-administered medications for this visit.    REVIEW OF SYSTEMS:   Constitutional: Denies fevers,  chills or abnormal night sweats  All other systems were reviewed with the patient and are negative.  PHYSICAL EXAMINATION: ECOG PERFORMANCE STATUS: 0 - Asymptomatic  Vitals:   12/06/21 1307  BP: (!) 152/81  Pulse: 98  Resp: 18  Temp: (!) 97.5 F (36.4 C)  SpO2: 99%   Filed Weights   12/06/21 1307  Weight: 192 lb 1.6 oz (87.1 kg)      LABORATORY DATA:  I have reviewed the data as listed Lab Results  Component Value Date   WBC 4.9 10/30/2021   HGB 12.1 (L) 10/30/2021   HCT 37.7 10/30/2021   MCV 79 10/30/2021   PLT 312 10/30/2021   Lab Results  Component Value Date   NA 144 10/30/2021   K 4.4 10/30/2021   CL 105 10/30/2021   CO2 23 10/30/2021    RADIOGRAPHIC STUDIES: I have personally reviewed the radiological reports and agreed with the findings in the report.  ASSESSMENT AND PLAN:  Iron deficiency anemia 10/30/2021: Hemoglobin 12.1, MCV 79, RDW 16.4, iron saturation 9%, ferritin 14, TIBC 448  Longstanding history of iron deficiency without anemia.  No received blood transfusions or iron infusions.  He moved from [place] to [place] about 7 years ago.  He was told that he has iron deficiency even when he was in [place]. Discussion: I discussed with the patient that the 2 likely causes of iron deficiency or blood loss versus malabsorption.  Patient had an upper endoscopy and colonoscopy within the last year and therefore blood loss has been ruled out.  Recommendation: 1.  Oral iron therapy OTC 1 tablet daily 2. recheck labs in 3 months.  If his iron studies do not improve then it is likely the cause of iron deficiency to be malabsorption.  Return to clinic in 3 months with labs and a telephone visit 2 days later to discuss results.  All questions were answered. The patient knows to call the clinic with any problems, questions or concerns.    Harriette Ohara, MD 12/06/21

## 2021-12-06 NOTE — Assessment & Plan Note (Signed)
10/30/2021: Hemoglobin 12.1, MCV 79, RDW 16.4, iron saturation 9%, ferritin 14, TIBC 448  Longstanding history of iron deficiency without anemia.  No received blood transfusions or iron infusions.  He moved from [place] to [place] about 7 years ago.  He was told that he has iron deficiency even when he was in [place]. Discussion: I discussed with the patient that the 2 likely causes of iron deficiency or blood loss versus malabsorption.  Patient had an upper endoscopy and colonoscopy within the last year and therefore blood loss has been ruled out.  Recommendation: 1.  Oral iron therapy OTC 1 tablet daily 2. recheck labs in 3 months.  If his iron studies do not improve then it is likely the cause of iron deficiency to be malabsorption.  Return to clinic in 3 months with labs and a telephone visit 2 days later to discuss results.

## 2022-01-11 ENCOUNTER — Other Ambulatory Visit: Payer: Self-pay | Admitting: Family Medicine

## 2022-01-11 DIAGNOSIS — E119 Type 2 diabetes mellitus without complications: Secondary | ICD-10-CM

## 2022-01-16 NOTE — Progress Notes (Signed)
Cardiology Office Note:    Date:  01/17/2022   ID:  Jon [name], DOB [birth year], MRN [id number]  PCP:  Gildardo Pounds, NP   [facility] Providers Cardiologist:  Skeet Latch, MD     Referring MD: Gildardo Pounds, NP   No chief complaint on file.   History of Present Illness:    Jon [name] is a 66 y.o. male with a hx of nonobstructive CAD, mild ascending aortic aneurysm (4.0 cm), hypertension, hyperlipidemia, diabetes, carotid stenosis,and prior stroke here for follow-up.  He was first seen in the hospital 10/2020 with chest pain.  Cardiac enzymes were negative and EKG was without ischemic changes.  He was referred for coronary CTA 10/2020 with a calcium score 170, which was 80th percentile for age and gender.  He had mild plaque in the proximal LAD and otherwise minimal plaque.  His ascending aorta was mildly dilated to 4.0 cm.  That admission he was found to have a left thalamic infarct and was treated with aspirin and clopidogrel.  He followed up with Laurann Montana on 11/2020 and was doing well.  He was started on Zetia given that his lipids were not well-controlled. He saw Ms. Fleming on 03/20/21 and his blood pressure was poorly controlled on amlodipine and losartan.   At the last visit he was doing well and exercising regularly. He participated in the PREP program at the [facility]. He was seen by hematology for iron deficiency and anemia. Upper and lower endoscopy were normal, and he was started on oral iron.   Today, he is feeling good with no new cardiovascular concerns. At home he reports blood pressures are usually well controlled, sometimes slightly higher than 144 systolic. He has remained busy with work, which is often physical. He is also exercising regularly by walking at the [facility], 3 times a week. He is only limited by some issues with his lower back. He is compliant with his iron supplement, and denies feeling significant fatigue. He denies any palpitations, chest pain,  shortness of breath, or peripheral edema. No lightheadedness, headaches, syncope, orthopnea, or PND.   Past Medical History:  Diagnosis Date   Ascending aortic aneurysm ([place]) 01/17/2022   Carotid artery occlusion    Diabetes mellitus without complication ([place])    type II   History of kidney stones    Hyperlipidemia    Hypertension    Kidney stones    Stomach ulcer    Stroke [facility])    Dec. 14 2018    Past Surgical History:  Procedure Laterality Date   CYSTOSCOPY Left 12/10/2016   Procedure: cystoscopy with left ureteral stone extraction ;  Surgeon: Kathie Rhodes, MD;  Location: [facility];  Service: Urology;  Laterality: Left;   HERNIA REPAIR     Stomach ulcer      Current Medications: Current Meds  Medication Sig   amLODipine (NORVASC) 10 MG tablet TAKE 1 TABLET BY MOUTH DAILY   atorvastatin (LIPITOR) 80 MG tablet Take 1 tablet (80 mg total) by mouth daily.   clopidogrel (PLAVIX) 75 MG tablet Take 1 tablet (75 mg total) by mouth daily.   Continuous Blood Gluc Receiver (FREESTYLE LIBRE 2 READER) DEVI Use as instructed. Check blood glucose level by fingerstick three times per day.  E11.65   Continuous Blood Gluc Sensor (FREESTYLE LIBRE 2 SENSOR) MISC Use as instructed. Check blood glucose level by fingerstick three times per day.   ezetimibe (ZETIA) 10 MG tablet TAKE 1 TABLET BY MOUTH DAILY  glimepiride (AMARYL) 4 MG tablet TAKE 2 TABLETS BY MOUTH DAILY  WITH BREAKFAST   glucose blood (ONETOUCH VERIO) test strip USE AS INSTRUCTED TO CHECK BLOOD SUGAR DAILY.   losartan (COZAAR) 50 MG tablet TAKE 1 TABLET BY MOUTH DAILY   metFORMIN (GLUCOPHAGE) 1000 MG tablet TAKE 1 TABLET BY MOUTH TWICE  DAILY WITH MEALS   TRULICITY 2.97 LG/9.2JJ SOPN Inject 0.75 mg into the skin once a week.     Allergies:   Patient has no known allergies.   Social History   Socioeconomic History   Marital status: Divorced    Spouse name: Not on file   Number of children: 4   Years of education: 16   Highest  education level: Not on file  Occupational History   Occupation: [employer] driver  Tobacco Use   Smoking status: Never   Smokeless tobacco: Never  Vaping Use   Vaping Use: Never used  Substance and Sexual Activity   Alcohol use: Yes    Alcohol/week: 1.0 - 2.0 standard drink of alcohol    Types: 1 - 2 Glasses of wine per week    Comment: socially   Drug use: No   Sexual activity: Not Currently  Other Topics Concern   Not on file  Social History Narrative   Fun: Politics   Social Determinants of Health   Financial Resource Strain: Not on file  Food Insecurity: Not on file  Transportation Needs: Not on file  Physical Activity: Not on file  Stress: Not on file  Social Connections: Not on file     Family History: The patient's family history includes Diabetes in his daughter, father, sister, and son; Hypertension in his father and mother; Stroke in his father.  ROS:   Please see the history of present illness.    (+) Lower back pain All other systems reviewed and are negative.  EKGs/Labs/Other Studies Reviewed:    The following studies were reviewed today:  Bilateral Carotid Doppler  09/25/2021: Summary:  Right Carotid: Velocities in the right ICA are consistent with a 1-39%  stenosis.                 Non-hemodynamically significant plaque <50% noted in the  CCA.   Left Carotid: There is no evidence of stenosis in the left ICA.                Non-hemodynamically significant plaque <50% noted in the  CCA.   Vertebrals:  Bilateral vertebral arteries demonstrate antegrade flow.  Subclavians: Normal flow hemodynamics were seen in bilateral subclavian arteries.   Coronary CT  11/08/2020: IMPRESSION: 1. Coronary calcium score of 170. This was 50 percentile for age-, sex, and race-matched controls.   2. Normal coronary origin with right dominance.   3. Mild (45-49) soft tissue stenosis in the proximal LAD; otherwise minimal plaque as outlined above.   4. Milldy dilated  aortic root at sinuses (40 mm); aortic atherosclerosis noted.   5. Mildly dilated pulmonary artery.   RECOMMENDATIONS: CAD-RADS 2: Mild non-obstructive CAD (25-49%). Consider non-atherosclerotic causes of chest pain. Consider preventive therapy and risk factor modification.  CTA Neck  08/29/2020: IMPRESSION: Left common carotid and internal carotid arteries patent within the neck. Unchanged less than 50% atherosclerotic stenosis of the proximal left ICA. Redemonstrated small focal outpouching near the left ICA origin, which may reflect a focus of ulcerated plaque.   Right common carotid and internal carotid arteries patent within the neck without stenosis. Mild atherosclerotic plaque within the  carotid bifurcation and proximal ICA.   Vertebral arteries patent within the neck. Unchanged predominantly soft plaque within the proximal V1 left vertebral artery with associated irregularity and moderate stenosis.   Paranasal sinus disease as described.   Cervical spondylosis.  Echocardiogram  09/18/2019:  1. Left ventricular ejection fraction, by estimation, is 60 to 65%. The  left ventricle has normal function. The left ventricle has no regional  wall motion abnormalities. There is moderate concentric left ventricular  hypertrophy. Left ventricular  diastolic parameters were normal.   2. Right ventricular systolic function is normal. The right ventricular  size is normal. There is normal pulmonary artery systolic pressure.   3. The mitral valve is normal in structure. Trivial mitral valve  regurgitation. No evidence of mitral stenosis.   4. The aortic valve is tricuspid. Aortic valve regurgitation is not  visualized. No aortic stenosis is present.   5. The inferior vena cava is normal in size with greater than 50%  respiratory variability, suggesting right atrial pressure of 3 mmHg.   Conclusion(s)/Recommendation(s): No intracardiac source of embolism  detected on this  transthoracic study. A transesophageal echocardiogram is recommended to exclude cardiac source of embolism if clinically indicated.   EKG:   EKG is personally reviewed. 01/17/2022: Sinus rhythm. Rate 84 bpm. First degree AV block. LAD. 03/27/2021:  Sinus rhythm.  Rate 92 bpm.  First degree AV block. Cannot rule out prior anterior infarct.   Recent Labs: 10/30/2021: ALT 22; BUN 9; Creatinine, Ser 0.90; Hemoglobin 12.1; Platelets 312; Potassium 4.4; Sodium 144   Recent Lipid Panel    Component Value Date/Time   CHOL 102 03/13/2021 1437   TRIG 73 03/13/2021 1437   HDL 34 (L) 03/13/2021 1437   CHOLHDL 3.0 03/13/2021 1437   CHOLHDL 4.0 11/08/2020 1214   VLDL 8 11/08/2020 1214   LDLCALC 53 03/13/2021 1437     Risk Assessment/Calculations:           Physical Exam:    Wt Readings from Last 3 Encounters:  01/17/22 191 lb 12.8 oz (87 kg)  12/06/21 192 lb 1.6 oz (87.1 kg)  10/30/21 192 lb (87.1 kg)     VS:  BP 132/68 (BP Location: Left Arm, Patient Position: Sitting, Cuff Size: Normal)   Pulse 84   Ht '5\' 10"'$  (1.778 m)   Wt 191 lb 12.8 oz (87 kg)   BMI 27.52 kg/m  , BMI Body mass index is 27.52 kg/m. GENERAL:  Well appearing HEENT: Pupils equal round and reactive, fundi not visualized, oral mucosa unremarkable NECK:  No jugular venous distention, waveform within normal limits, carotid upstroke brisk and symmetric, no bruits, no thyromegaly LUNGS:  Clear to auscultation bilaterally HEART:  RRR.  PMI not displaced or sustained,S1 and S2 within normal limits, no S3, no S4, no clicks, no rubs, no murmurs ABD:  Flat, positive bowel sounds normal in frequency in pitch, no bruits, no rebound, no guarding, no midline pulsatile mass, no hepatomegaly, no splenomegaly EXT:  2 plus pulses throughout, no edema, no cyanosis no clubbing SKIN:  No rashes no nodules NEURO:  Cranial nerves II through XII grossly intact, motor grossly intact throughout PSYCH:  Cognitively intact, oriented to person  place and time   ASSESSMENT:    1. Primary hypertension   2. Dyslipidemia   3. Aneurysm of ascending aorta without rupture (HCC)    PLAN:    HTN (hypertension) Blood pressure has been much better controlled.  He was congratulated on his regular exercise.  Continue amlodipine and losartan.  Blood pressure goal is less than 130/80.  Dyslipidemia The Szymon well controlled.  His LDL goal is less than 70.  He is due for follow-up with his PCP so we will not check lipids at this time.  Continue atorvastatin and Zetia.  Keep up the regular exercise.  Ascending aortic aneurysm (HCC) Ascending aorta was 4.0 cm on CT last year.  We will get an echocardiogram to reassess.  Blood pressures well controlled.  Disposition: FU with Kimetha Trulson C. Oval Linsey, MD, [facility] in 1 year.  Medication Adjustments/Labs and Tests Ordered: Current medicines are reviewed at length with the patient today.  Concerns regarding medicines are outlined above.   Orders Placed This Encounter  Procedures   EKG 12-Lead   ECHOCARDIOGRAM COMPLETE   No orders of the defined types were placed in this encounter.  Patient Instructions  Medication Instructions:  Your physician recommends that you continue on your current medications as directed. Please refer to the Current Medication list given to you today.   *If you need a refill on your cardiac medications before your next appointment, please call your pharmacy*  Lab Work: NONE   Testing/Procedures: Your physician has requested that you have an echocardiogram. Echocardiography is a painless test that uses sound waves to create images of your heart. It provides your doctor with information about the size and shape of your heart and how well your heart's chambers and valves are working. This procedure takes approximately one hour. There are no restrictions for this procedure. 02/02/2022 10:00 AM AT [facility]   Follow-Up: At [facility], you and your health needs  are our priority.  As part of our continuing mission to provide you with exceptional heart care, we have created designated Provider Care Teams.  These Care Teams include your primary Cardiologist (physician) and Advanced Practice Providers (APPs -  Physician Assistants and Nurse Practitioners) who all work together to provide you with the care you need, when you need it.  We recommend signing up for the patient portal called "MyChart".  Sign up information is provided on this After Visit Summary.  MyChart is used to connect with patients for Virtual Visits (Telemedicine).  Patients are able to view lab/test results, encounter notes, upcoming appointments, etc.  Non-urgent messages can be sent to your provider as well.   To learn more about what you can do with MyChart, go to [email or web address].    Your next appointment:   12 month(s)  The format for your next appointment:   In Person  Provider:   Skeet Latch, MD         [facility] Stumpf,acting as a scribe for Skeet Latch, MD.,have documented all relevant documentation on the behalf of Skeet Latch, MD,as directed by  Skeet Latch, MD while in the presence of Skeet Latch, MD.  I, [place] Oval Linsey, MD have reviewed all documentation for this visit.  The documentation of the exam, diagnosis, procedures, and orders on 01/17/2022 are all accurate and complete.   Signed, Skeet Latch, MD  01/17/2022 1:23 PM     [facility]

## 2022-01-17 ENCOUNTER — Ambulatory Visit (HOSPITAL_BASED_OUTPATIENT_CLINIC_OR_DEPARTMENT_OTHER): Payer: Medicare Other | Admitting: Cardiovascular Disease

## 2022-01-17 ENCOUNTER — Encounter (HOSPITAL_BASED_OUTPATIENT_CLINIC_OR_DEPARTMENT_OTHER): Payer: Self-pay | Admitting: Cardiovascular Disease

## 2022-01-17 DIAGNOSIS — E785 Hyperlipidemia, unspecified: Secondary | ICD-10-CM | POA: Diagnosis not present

## 2022-01-17 DIAGNOSIS — I10 Essential (primary) hypertension: Secondary | ICD-10-CM

## 2022-01-17 DIAGNOSIS — I7121 Aneurysm of the ascending aorta, without rupture: Secondary | ICD-10-CM | POA: Diagnosis not present

## 2022-01-17 HISTORY — DX: Aneurysm of the ascending aorta, without rupture: I71.21

## 2022-01-17 NOTE — Assessment & Plan Note (Signed)
The Szymon well controlled.  Jon [name] LDL goal is less than 70.  He is due for follow-up with Jon [name] PCP so we will not check lipids at this time.  Continue atorvastatin and Zetia.  Keep up the regular exercise.

## 2022-01-17 NOTE — Patient Instructions (Addendum)
Medication Instructions:  Your physician recommends that you continue on your current medications as directed. Please refer to the Current Medication list given to you today.   *If you need a refill on your cardiac medications before your next appointment, please call your pharmacy*  Lab Work: NONE   Testing/Procedures: Your physician has requested that you have an echocardiogram. Echocardiography is a painless test that uses sound waves to create images of your heart. It provides your doctor with information about the size and shape of your heart and how well your heart's chambers and valves are working. This procedure takes approximately one hour. There are no restrictions for this procedure. 02/02/2022 10:00 AM AT [facility]   Follow-Up: At [facility], you and your health needs are our priority.  As part of our continuing mission to provide you with exceptional heart care, we have created designated Provider Care Teams.  These Care Teams include your primary Cardiologist (physician) and Advanced Practice Providers (APPs -  Physician Assistants and Nurse Practitioners) who all work together to provide you with the care you need, when you need it.  We recommend signing up for the patient portal called "MyChart".  Sign up information is provided on this After Visit Summary.  MyChart is used to connect with patients for Virtual Visits (Telemedicine).  Patients are able to view lab/test results, encounter notes, upcoming appointments, etc.  Non-urgent messages can be sent to your provider as well.   To learn more about what you can do with MyChart, go to [email or web address].    Your next appointment:   12 month(s)  The format for your next appointment:   In Person  Provider:   Skeet Latch, MD

## 2022-01-17 NOTE — Assessment & Plan Note (Signed)
Ascending aorta was 4.0 cm on CT last year.  We will get an echocardiogram to reassess.  Blood pressures well controlled.

## 2022-01-17 NOTE — Assessment & Plan Note (Signed)
Blood pressure has been much better controlled.  He was congratulated on his regular exercise.  Continue amlodipine and losartan.  Blood pressure goal is less than 130/80.

## 2022-01-18 ENCOUNTER — Ambulatory Visit: Payer: Self-pay | Admitting: Licensed Clinical Social Worker

## 2022-01-18 NOTE — Patient Outreach (Signed)
  Care Coordination   01/18/2022 Name: Jon [name] MRN: [id number] DOB: 01-16-1956   Care Coordination Outreach Attempts:  An unsuccessful telephone outreach was attempted today to offer the patient information about available care coordination services as a benefit of their health plan.   Follow Up Plan:  Additional outreach attempts will be made to offer the patient care coordination information and services.   Encounter Outcome:  No Answer  Care Coordination Interventions Activated:  No   Care Coordination Interventions:  No, not indicated    Christa See, MSW, [email or web address] Phone [phone] 2:29 PM

## 2022-01-22 ENCOUNTER — Other Ambulatory Visit: Payer: Self-pay

## 2022-01-22 ENCOUNTER — Other Ambulatory Visit: Payer: Self-pay | Admitting: Nurse Practitioner

## 2022-01-22 DIAGNOSIS — E119 Type 2 diabetes mellitus without complications: Secondary | ICD-10-CM

## 2022-01-23 ENCOUNTER — Ambulatory Visit: Payer: Self-pay

## 2022-01-23 ENCOUNTER — Other Ambulatory Visit: Payer: Self-pay

## 2022-01-23 MED ORDER — ONETOUCH VERIO VI STRP
ORAL_STRIP | 1 refills | Status: DC
  Filled 2022-01-23: qty 50, 50d supply, fill #0
  Filled 2022-03-12: qty 50, 50d supply, fill #1

## 2022-01-23 NOTE — Telephone Encounter (Signed)
Requested Prescriptions  Pending Prescriptions Disp Refills  . glucose blood (ONETOUCH VERIO) test strip 50 strip 1    Sig: USE AS INSTRUCTED TO CHECK BLOOD SUGAR DAILY.     Endocrinology: Diabetes - Testing Supplies Passed - 01/22/2022  9:18 AM      Passed - Valid encounter within last 12 months    Recent Outpatient Visits          2 months ago Type 2 diabetes mellitus without complication, without long-term current use of insulin [facility])   [place], [place] W, NP   7 months ago Type 2 diabetes mellitus without complication, without long-term current use of insulin [facility])   [place], [place] W, NP   10 months ago Type 2 diabetes mellitus without complication, without long-term current use of insulin [facility])   [place], [place] W, NP   1 year ago Type 2 diabetes mellitus without complication, without long-term current use of insulin [facility])   [place], [place] Buff, NP   1 year ago Hospital discharge follow-up   [place] Gildardo Pounds, NP

## 2022-01-23 NOTE — Patient Outreach (Signed)
  Care Coordination   01/23/2022 Name: Jon [name] MRN: [id number] DOB: 1955-08-02   Care Coordination Outreach Attempts:  A second unsuccessful outreach was attempted today to offer the patient with information about available care coordination services as a benefit of their health plan.     Follow Up Plan:  Additional outreach attempts will be made to offer the patient care coordination information and services.   Encounter Outcome:  No Answer  Care Coordination Interventions Activated:  No   Care Coordination Interventions:  No, not indicated    Daneen Schick, BSW, CDP Social Worker, Certified Dementia Practitioner Care Coordination [phone]

## 2022-01-24 ENCOUNTER — Ambulatory Visit: Payer: Self-pay

## 2022-01-24 ENCOUNTER — Other Ambulatory Visit: Payer: Self-pay

## 2022-01-24 NOTE — Patient Outreach (Signed)
  Care Coordination   Initial Visit Note   01/24/2022 Name: Jaylee Lantry MRN: [id number] DOB: [birth year]  Farron Watrous is a 66 y.o. year old male who sees Gildardo Pounds, NP for primary care. I spoke with  [name] by phone today.  What matters to the patients health and wellness today?  Pt scheduled to speak with SW 9/14 at 10:30    Goals Addressed   None     SDOH assessments and interventions completed:  No     Care Coordination Interventions Activated:  No  Care Coordination Interventions:  No, not indicated   Follow up plan: Follow up call scheduled for 9/14    Encounter Outcome:  Pt. Scheduled   Daneen Schick, [place], CDP Social Worker, Certified Dementia Practitioner Care Coordination [phone]

## 2022-01-25 ENCOUNTER — Ambulatory Visit: Payer: Self-pay

## 2022-01-25 NOTE — Patient Instructions (Signed)
Visit Information  Thank you for taking time to visit with me today. Please don't hesitate to contact me if I can be of assistance to you.   Following are the goals we discussed today:   Goals Addressed             This Visit's Progress    COMPLETED: Care Coordination Activities       Care Coordination Interventions: SDoH screening performed - no acute resource challenges identified at this time Discussed the patient does not have concerns with medication adherence at this time. Patient does indicate it is difficult at time to afford Trulicity Encouraged the patient to speak with his provider if he is unable to afford medications to determine if coupon cards are available and/or alternative medications which offer PAP Education on the role of the care coordination team - no follow up desired at this time Instructed the patient to contact his primary care provider as needed        Please call the care guide team at [phone] if you need to schedule an appointment with our care coordination team.  If you are experiencing a Mental Health or [place] or need someone to talk to, please call 1-800-273-TALK (toll free, 24 hour hotline)  Patient verbalizes understanding of instructions and care plan provided today and agrees to view in [place]. Active MyChart status and patient understanding of how to access instructions and care plan via MyChart confirmed with patient.     No further follow up required: Please contact your primary care provider as needed  Daneen Schick, [place], CDP Social Worker, Certified Dementia Practitioner [place] Coordination [phone]

## 2022-01-25 NOTE — Patient Outreach (Signed)
  Care Coordination   Initial Visit Note   01/25/2022 Name: Jon [name] MRN: [id number] DOB: 05-May-1956  Jon [name] is a 66 y.o. year old male who sees Jon Pounds, NP for primary care. I spoke with  Jon [name] by phone today.  What matters to the patients health and wellness today?  No concerns, doing well at this time    Goals Addressed             This Visit's Progress    COMPLETED: Care Coordination Activities       Care Coordination Interventions: SDoH screening performed - no acute resource challenges identified at this time Discussed the patient does not have concerns with medication adherence at this time. Patient does indicate it is difficult at time to afford Trulicity Encouraged the patient to speak with his provider if he is unable to afford medications to determine if coupon cards are available and/or alternative medications which offer PAP Education on the role of the care coordination team - no follow up desired at this time Instructed the patient to contact his primary care provider as needed        SDOH assessments and interventions completed:  Yes  SDOH Interventions Today    Flowsheet Row Most Recent Value  SDOH Interventions   Food Insecurity Interventions Intervention Not Indicated  Housing Interventions Intervention Not Indicated  Transportation Interventions Intervention Not Indicated  Utilities Interventions Intervention Not Indicated        Care Coordination Interventions Activated:  Yes  Care Coordination Interventions:  Yes, provided   Follow up plan: No further intervention required.   Encounter Outcome:  Pt. Visit Completed   Daneen Schick, BSW, CDP Social Worker, Certified Dementia Practitioner [place] [facility]  Care Coordination [phone]

## 2022-02-02 ENCOUNTER — Ambulatory Visit (INDEPENDENT_AMBULATORY_CARE_PROVIDER_SITE_OTHER): Payer: Medicare Other

## 2022-02-02 DIAGNOSIS — I10 Essential (primary) hypertension: Secondary | ICD-10-CM | POA: Diagnosis not present

## 2022-02-02 DIAGNOSIS — I7121 Aneurysm of the ascending aorta, without rupture: Secondary | ICD-10-CM | POA: Diagnosis not present

## 2022-02-02 LAB — ECHOCARDIOGRAM COMPLETE
AR max vel: 2.82 cm2
AV Area VTI: 3.1 cm2
AV Area mean vel: 3.1 cm2
AV Mean grad: 2 mmHg
AV Peak grad: 4.6 mmHg
Ao pk vel: 1.07 m/s
Area-P 1/2: 2.37 cm2
Calc EF: 61.9 %
S' Lateral: 2.22 cm
Single Plane A2C EF: 64 %
Single Plane A4C EF: 57.5 %

## 2022-02-12 DIAGNOSIS — Z1152 Encounter for screening for COVID-19: Secondary | ICD-10-CM | POA: Diagnosis not present

## 2022-02-12 DIAGNOSIS — R059 Cough, unspecified: Secondary | ICD-10-CM | POA: Diagnosis not present

## 2022-02-12 DIAGNOSIS — J069 Acute upper respiratory infection, unspecified: Secondary | ICD-10-CM | POA: Diagnosis not present

## 2022-02-16 ENCOUNTER — Other Ambulatory Visit: Payer: Self-pay | Admitting: Family Medicine

## 2022-02-16 DIAGNOSIS — E119 Type 2 diabetes mellitus without complications: Secondary | ICD-10-CM

## 2022-02-19 NOTE — Telephone Encounter (Signed)
Requested Prescriptions  Pending Prescriptions Disp Refills  . amLODipine (NORVASC) 10 MG tablet [Pharmacy Med Name: amLODIPine Besylate 10 MG Oral Tablet] 100 tablet 2    Sig: TAKE 1 TABLET BY MOUTH DAILY     Cardiovascular: Calcium Channel Blockers 2 Passed - 02/16/2022 10:22 PM      Passed - Last BP in normal range    BP Readings from Last 1 Encounters:  01/17/22 132/68         Passed - Last Heart Rate in normal range    Pulse Readings from Last 1 Encounters:  01/17/22 84         Passed - Valid encounter within last 6 months    Recent Outpatient Visits          3 months ago Type 2 diabetes mellitus without complication, without long-term current use of insulin ([place])   [place], [place] W, NP   8 months ago Type 2 diabetes mellitus without complication, without long-term current use of insulin [facility])   [place], [place] W, NP   11 months ago Type 2 diabetes mellitus without complication, without long-term current use of insulin [facility])   [place], [place] W, NP   1 year ago Type 2 diabetes mellitus without complication, without long-term current use of insulin [facility])   [place], Vernia Buff, NP   1 year ago Hospital discharge follow-up   [place], Zelda W, NP      Future Appointments            In 2 weeks Gildardo Pounds, NP [place]           . metFORMIN (GLUCOPHAGE) 1000 MG tablet [Pharmacy Med Name: metFORMIN HCl 1000 MG Oral Tablet] 200 tablet 2    Sig: TAKE 1 TABLET BY MOUTH TWICE  DAILY WITH MEALS     Endocrinology:  Diabetes - Biguanides Failed - 02/16/2022 10:22 PM      Failed - B12 Level in normal range and within 720 days    Vitamin B-12  Date Value Ref Range Status  09/14/2019 1,068 232 - 1,245 pg/mL Final         Passed - Cr in  normal range and within 360 days    Creatinine, Ser  Date Value Ref Range Status  10/30/2021 0.90 0.76 - 1.27 mg/dL Final   Creatinine,U  Date Value Ref Range Status  06/08/2016 171.6 mg/dL Final         Passed - HBA1C is between 0 and 7.9 and within 180 days    HbA1c, POC (prediabetic range)  Date Value Ref Range Status  10/30/2021 6.4 5.7 - 6.4 % Final         Passed - eGFR in normal range and within 360 days    GFR calc Af Amer  Date Value Ref Range Status  06/13/2020 100 >59 mL/min/1.73 Final    Comment:    **In accordance with recommendations from the NKF-ASN Task force,**   [facility] is in the process of updating its eGFR calculation to the   2021 CKD-EPI creatinine equation that estimates kidney function   without a race variable.    GFR, Estimated  Date Value Ref Range Status  11/08/2020 >60 >60 mL/min Final    Comment:    (NOTE) Calculated using the CKD-EPI Creatinine Equation (  2021)    GFR  Date Value Ref Range Status  03/13/2017 95.90 >60.00 mL/min Final   eGFR  Date Value Ref Range Status  10/30/2021 94 >59 mL/min/1.73 Final         Passed - Valid encounter within last 6 months    Recent Outpatient Visits          3 months ago Type 2 diabetes mellitus without complication, without long-term current use of insulin ([place])   [place], [place] W, NP   8 months ago Type 2 diabetes mellitus without complication, without long-term current use of insulin ([place])   [place], [place] W, NP   11 months ago Type 2 diabetes mellitus without complication, without long-term current use of insulin ([place])   [place], [place] W, NP   1 year ago Type 2 diabetes mellitus without complication, without long-term current use of insulin ([place])   [place], Vernia Buff, NP   1 year ago Hospital discharge follow-up   [place], Zelda W, NP      Future Appointments            In 2 weeks Gildardo Pounds, NP [place] - CBC within normal limits and completed in the last 12 months    WBC  Date Value Ref Range Status  10/30/2021 4.9 3.4 - 10.8 x10E3/uL Final  11/08/2020 5.2 4.0 - 10.5 K/uL Final   RBC  Date Value Ref Range Status  10/30/2021 4.79 4.14 - 5.80 x10E6/uL Final  11/08/2020 4.50 4.22 - 5.81 MIL/uL Final   Hemoglobin  Date Value Ref Range Status  10/30/2021 12.1 (L) 13.0 - 17.7 g/dL Final   Hematocrit  Date Value Ref Range Status  10/30/2021 37.7 37.5 - 51.0 % Final   MCHC  Date Value Ref Range Status  10/30/2021 32.1 31.5 - 35.7 g/dL Final  11/08/2020 31.8 30.0 - 36.0 g/dL Final   [facility]  Date Value Ref Range Status  10/30/2021 25.3 (L) 26.6 - 33.0 pg Final  11/08/2020 25.1 (L) 26.0 - 34.0 pg Final   MCV  Date Value Ref Range Status  10/30/2021 79 79 - 97 fL Final   No results found for: "PLTCOUNTKUC", "LABPLAT", "POCPLA" RDW  Date Value Ref Range Status  10/30/2021 16.4 (H) 11.6 - 15.4 % Final

## 2022-03-05 ENCOUNTER — Other Ambulatory Visit: Payer: Self-pay

## 2022-03-05 ENCOUNTER — Encounter (HOSPITAL_BASED_OUTPATIENT_CLINIC_OR_DEPARTMENT_OTHER): Payer: Self-pay

## 2022-03-05 DIAGNOSIS — U071 COVID-19: Secondary | ICD-10-CM | POA: Diagnosis not present

## 2022-03-05 DIAGNOSIS — Z5321 Procedure and treatment not carried out due to patient leaving prior to being seen by health care provider: Secondary | ICD-10-CM | POA: Insufficient documentation

## 2022-03-05 DIAGNOSIS — I10 Essential (primary) hypertension: Secondary | ICD-10-CM | POA: Insufficient documentation

## 2022-03-05 DIAGNOSIS — E119 Type 2 diabetes mellitus without complications: Secondary | ICD-10-CM | POA: Insufficient documentation

## 2022-03-05 DIAGNOSIS — R509 Fever, unspecified: Secondary | ICD-10-CM | POA: Diagnosis present

## 2022-03-05 LAB — CBG MONITORING, ED: Glucose-Capillary: 151 mg/dL — ABNORMAL HIGH (ref 70–99)

## 2022-03-05 LAB — RESP PANEL BY RT-PCR (FLU A&B, COVID) ARPGX2
Influenza A by PCR: NEGATIVE
Influenza B by PCR: NEGATIVE
SARS Coronavirus 2 by RT PCR: POSITIVE — AB

## 2022-03-05 MED ORDER — ACETAMINOPHEN 325 MG PO TABS
650.0000 mg | ORAL_TABLET | Freq: Once | ORAL | Status: AC | PRN
  Administered 2022-03-05: 650 mg via ORAL
  Filled 2022-03-05: qty 2

## 2022-03-05 NOTE — ED Triage Notes (Addendum)
Pt presents to the ED with high fever and hypertension. States that they took his temperature about 50 minutes ago and it was 104 tympanically. No otc medication was given. Temp 102.7 orally at time of triage. Pt is a diabetic and his BG 151 at time of triage. Pt states no pain, but he does feel fatigue and weakness. Pt has a hx of hypertension. Has not taken his BP medication today. Pt having some difficulty with articulating his sentences, but significant other states that this is his baseline since his stroke. Speech is clear without slur. No unilateral weakness. Pt ambulatory to triage room. Pt A&Ox4 at time of triage.

## 2022-03-06 ENCOUNTER — Ambulatory Visit: Payer: Medicare Other | Attending: Nurse Practitioner | Admitting: Nurse Practitioner

## 2022-03-06 ENCOUNTER — Encounter: Payer: Self-pay | Admitting: Nurse Practitioner

## 2022-03-06 ENCOUNTER — Emergency Department (HOSPITAL_BASED_OUTPATIENT_CLINIC_OR_DEPARTMENT_OTHER): Payer: Medicare Other

## 2022-03-06 ENCOUNTER — Emergency Department (HOSPITAL_BASED_OUTPATIENT_CLINIC_OR_DEPARTMENT_OTHER)
Admission: EM | Admit: 2022-03-06 | Discharge: 2022-03-06 | Discharge status: Against Medical Advice | Payer: Medicare Other | Attending: Emergency Medicine | Admitting: Emergency Medicine

## 2022-03-06 VITALS — BP 138/82 | HR 87 | Resp 20 | Ht 71.0 in | Wt 181.0 lb

## 2022-03-06 DIAGNOSIS — U071 COVID-19: Secondary | ICD-10-CM

## 2022-03-06 NOTE — Progress Notes (Signed)
F/u HTN Was at ED last night for weakness  and fever. Was Dx'ed with Covid.

## 2022-03-07 ENCOUNTER — Other Ambulatory Visit: Payer: Self-pay

## 2022-03-07 ENCOUNTER — Other Ambulatory Visit: Payer: Self-pay | Admitting: Nurse Practitioner

## 2022-03-07 DIAGNOSIS — U071 COVID-19: Secondary | ICD-10-CM

## 2022-03-07 MED ORDER — MOLNUPIRAVIR EUA 200MG CAPSULE
4.0000 | ORAL_CAPSULE | Freq: Two times a day (BID) | ORAL | 0 refills | Status: AC
  Filled 2022-03-07: qty 40, 5d supply, fill #0

## 2022-03-07 MED ORDER — BENZONATATE 100 MG PO CAPS
100.0000 mg | ORAL_CAPSULE | Freq: Three times a day (TID) | ORAL | 0 refills | Status: DC | PRN
  Filled 2022-03-07: qty 30, 5d supply, fill #0

## 2022-03-08 ENCOUNTER — Inpatient Hospital Stay: Payer: Medicare Other | Attending: Hematology and Oncology

## 2022-03-09 NOTE — Progress Notes (Signed)
HEMATOLOGY-ONCOLOGY TELEPHONE VISIT PROGRESS NOTE  I connected with our patient on 03/12/22 at 10:15 AM EDT by telephone and verified that I am speaking with the correct person using two identifiers.  I discussed the limitations, risks, security and privacy concerns of performing an evaluation and management service by telephone and the availability of in person appointments.  I also discussed with the patient that there may be a patient responsible charge related to this service. The patient expressed understanding and agreed to proceed.   History of Present Illness: Jon [name] 65 y.o. male is here because of recent diagnosis of iron deficiency. She presents to the clinic via phone follow-up.  He was diagnosed with COVID-19 infection and therefore he could not come for his lab appointment.  REVIEW OF SYSTEMS:   Constitutional: Denies fevers, chills or abnormal weight loss All other systems were reviewed with the patient and are negative. Observations/Objective:     Assessment Plan:  Iron deficiency anemia 10/30/2021: Hemoglobin 12.1, MCV 79, RDW 16.4, iron saturation 9%, ferritin 14, TIBC 448 Patient had an upper endoscopy and colonoscopy within the last year and therefore blood loss has been ruled out.  Current treatment: 1.  Oral iron therapy OTC 1 tablet daily Because he was diagnosed with COVID-19 he could not come for the lab appointment.  We will set up another lab appointment in 1 month and I will call him after that to discuss the results. If his iron studies do not improve then it is likely the cause of iron deficiency to be malabsorption.  No charge for today's telephone visit  I discussed the assessment and treatment plan with the patient. The patient was provided an opportunity to ask questions and all were answered. The patient agreed with the plan and demonstrated an understanding of the instructions. The patient was advised to call back or seek an in-person evaluation if  the symptoms worsen or if the condition fails to improve as anticipated.   I provided 12 minutes of non-face-to-face time during this encounter.  This includes time for charting and coordination of care   Harriette Ohara, MD  I Gardiner Coins am scribing for Dr. Lindi Adie  I have reviewed the above documentation for accuracy and completeness, and I agree with the above.

## 2022-03-12 ENCOUNTER — Telehealth: Payer: Self-pay | Admitting: Nurse Practitioner

## 2022-03-12 ENCOUNTER — Encounter: Payer: Self-pay | Admitting: Nurse Practitioner

## 2022-03-12 ENCOUNTER — Inpatient Hospital Stay (HOSPITAL_BASED_OUTPATIENT_CLINIC_OR_DEPARTMENT_OTHER): Payer: Medicare Other | Admitting: Hematology and Oncology

## 2022-03-12 ENCOUNTER — Other Ambulatory Visit: Payer: Self-pay

## 2022-03-12 DIAGNOSIS — D509 Iron deficiency anemia, unspecified: Secondary | ICD-10-CM

## 2022-03-12 NOTE — Telephone Encounter (Signed)
Letter is in his mychart

## 2022-03-12 NOTE — Telephone Encounter (Signed)
Pt had telephone visit w/ Zelda on 10/24 and was out of work all week. His employer is asking for a work note. Pt asking, Can Zelda write him one?  Please call pt and let him know something. He said Zelda took him out for the week.

## 2022-03-12 NOTE — Assessment & Plan Note (Signed)
10/30/2021: Hemoglobin 12.1, MCV 79, RDW 16.4, iron saturation 9%, ferritin 14, TIBC 448 Patient had an upper endoscopy and colonoscopy within the last year and therefore blood loss has been ruled out.  Current treatment: 1.  Oral iron therapy OTC 1 tablet daily 2. recheck labs in 3 months.  If his iron studies do not improve then it is likely the cause of iron deficiency to be malabsorption.

## 2022-03-13 ENCOUNTER — Telehealth: Payer: Self-pay | Admitting: Hematology and Oncology

## 2022-03-13 NOTE — Telephone Encounter (Signed)
Scheduled appointment per 10/30 los. Patient is aware.

## 2022-03-14 NOTE — Telephone Encounter (Signed)
Pt stated he was able to pull the work note from [employer]

## 2022-03-15 ENCOUNTER — Telehealth: Payer: Self-pay

## 2022-03-15 NOTE — Telephone Encounter (Signed)
     Patient  visit on 10/24  at [facility]  Have you been able to follow up with your primary care physician? NO  The patient was or was not able to obtain any needed medicine or equipment. YES  Are there diet recommendations that you are having difficulty following? NA  Patient expresses understanding of discharge instructions and education provided has no other needs at this time.  [place], [facility], Care Management  [phone] [address], [place], [place] [address] Phone: [phone] Email: [email or web address]

## 2022-04-03 ENCOUNTER — Ambulatory Visit: Payer: Medicare Other | Attending: Nurse Practitioner

## 2022-04-03 ENCOUNTER — Telehealth: Payer: Self-pay | Admitting: Emergency Medicine

## 2022-04-03 ENCOUNTER — Other Ambulatory Visit: Payer: Self-pay

## 2022-04-03 VITALS — Ht 70.0 in | Wt 185.0 lb

## 2022-04-03 DIAGNOSIS — Z0000 Encounter for general adult medical examination without abnormal findings: Secondary | ICD-10-CM | POA: Diagnosis not present

## 2022-04-03 MED ORDER — ACCU-CHEK SOFTCLIX LANCETS MISC
2 refills | Status: AC
  Filled 2022-04-03: qty 100, 33d supply, fill #0
  Filled 2022-05-04 – 2022-11-08 (×2): qty 100, 33d supply, fill #1

## 2022-04-03 MED ORDER — ACCU-CHEK GUIDE VI STRP
ORAL_STRIP | 2 refills | Status: DC
  Filled 2022-04-03: qty 100, 33d supply, fill #0
  Filled 2022-08-01: qty 100, 33d supply, fill #1
  Filled 2022-11-08: qty 100, 33d supply, fill #2

## 2022-04-03 MED ORDER — ACCU-CHEK GUIDE W/DEVICE KIT
PACK | 0 refills | Status: DC
  Filled 2022-04-03: qty 1, 30d supply, fill #0

## 2022-04-03 NOTE — Patient Instructions (Signed)
[name] , Thank you for taking time to come for your Medicare Wellness Visit. I appreciate your ongoing commitment to your health goals. Please review the following plan we discussed and let me know if I can assist you in the future.   Screening recommendations/referrals: Colonoscopy: completed 01/05/2021, due 01/06/2024 Recommended yearly ophthalmology/optometry visit for glaucoma screening and checkup Recommended yearly dental visit for hygiene and checkup  Vaccinations: Influenza vaccine: decline Pneumococcal vaccine: completed 08/24/2019 Tdap vaccine: due Shingles vaccine: discussed   Covid-19:  02/22/2020, 08/15/2019, 07/26/2019  Advanced directives: Advance directive discussed with you today.   Conditions/risks identified: none  Next appointment: Follow up in one year for your annual wellness visit.   Preventive Care 67 Years and Older, Male Preventive care refers to lifestyle choices and visits with your health care provider that can promote health and wellness. What does preventive care include? A yearly physical exam. This is also called an annual well check. Dental exams once or twice a year. Routine eye exams. Ask your health care provider how often you should have your eyes checked. Personal lifestyle choices, including: Daily care of your teeth and gums. Regular physical activity. Eating a healthy diet. Avoiding tobacco and drug use. Limiting alcohol use. Practicing safe sex. Taking low doses of aspirin every day. Taking vitamin and mineral supplements as recommended by your health care provider. What happens during an annual well check? The services and screenings done by your health care provider during your annual well check will depend on your age, overall health, lifestyle risk factors, and family history of disease. Counseling  Your health care provider may ask you questions about your: Alcohol use. Tobacco use. Drug use. Emotional well-being. Home and  relationship well-being. Sexual activity. Eating habits. History of falls. Memory and ability to understand (cognition). Work and work Statistician. Screening  You may have the following tests or measurements: Height, weight, and BMI. Blood pressure. Lipid and cholesterol levels. These may be checked every 5 years, or more frequently if you are over 37 years old. Skin check. Lung cancer screening. You may have this screening every year starting at age 15 if you have a 30-pack-year history of smoking and currently smoke or have quit within the past 15 years. Fecal occult blood test (FOBT) of the stool. You may have this test every year starting at age 10. Flexible sigmoidoscopy or colonoscopy. You may have a sigmoidoscopy every 5 years or a colonoscopy every 10 years starting at age 53. Prostate cancer screening. Recommendations will vary depending on your family history and other risks. Hepatitis C blood test. Hepatitis B blood test. Sexually transmitted disease (STD) testing. Diabetes screening. This is done by checking your blood sugar (glucose) after you have not eaten for a while (fasting). You may have this done every 1-3 years. Abdominal aortic aneurysm (AAA) screening. You may need this if you are a current or former smoker. Osteoporosis. You may be screened starting at age 55 if you are at high risk. Talk with your health care provider about your test results, treatment options, and if necessary, the need for more tests. Vaccines  Your health care provider may recommend certain vaccines, such as: Influenza vaccine. This is recommended every year. Tetanus, diphtheria, and acellular pertussis (Tdap, Td) vaccine. You may need a Td booster every 10 years. Zoster vaccine. You may need this after age 60. Pneumococcal 13-valent conjugate (PCV13) vaccine. One dose is recommended after age 37. Pneumococcal polysaccharide (PPSV23) vaccine. One dose is recommended after age  64. Talk to your  health care provider about which screenings and vaccines you need and how often you need them. This information is not intended to replace advice given to you by your health care provider. Make sure you discuss any questions you have with your health care provider. Document Released: 05/27/2015 Document Revised: 01/18/2016 Document Reviewed: 03/01/2015 Elsevier Interactive Patient Education  2017 [place] Prevention in the Home Falls can cause injuries. They can happen to people of all ages. There are many things you can do to make your home safe and to help prevent falls. What can I do on the outside of my home? Regularly fix the edges of walkways and driveways and fix any cracks. Remove anything that might make you trip as you walk through a door, such as a raised step or threshold. Trim any bushes or trees on the path to your home. Use bright outdoor lighting. Clear any walking paths of anything that might make someone trip, such as rocks or tools. Regularly check to see if handrails are loose or broken. Make sure that both sides of any steps have handrails. Any raised decks and porches should have guardrails on the edges. Have any leaves, snow, or ice cleared regularly. Use sand or salt on walking paths during winter. Clean up any spills in your garage right away. This includes oil or grease spills. What can I do in the bathroom? Use night lights. Install grab bars by the toilet and in the tub and shower. Do not use towel bars as grab bars. Use non-skid mats or decals in the tub or shower. If you need to sit down in the shower, use a plastic, non-slip stool. Keep the floor dry. Clean up any water that spills on the floor as soon as it happens. Remove soap buildup in the tub or shower regularly. Attach bath mats securely with double-sided non-slip rug tape. Do not have throw rugs and other things on the floor that can make you trip. What can I do in the bedroom? Use night  lights. Make sure that you have a light by your bed that is easy to reach. Do not use any sheets or blankets that are too big for your bed. They should not hang down onto the floor. Have a firm chair that has side arms. You can use this for support while you get dressed. Do not have throw rugs and other things on the floor that can make you trip. What can I do in the kitchen? Clean up any spills right away. Avoid walking on wet floors. Keep items that you use a lot in easy-to-reach places. If you need to reach something above you, use a strong step stool that has a grab bar. Keep electrical cords out of the way. Do not use floor polish or wax that makes floors slippery. If you must use wax, use non-skid floor wax. Do not have throw rugs and other things on the floor that can make you trip. What can I do with my stairs? Do not leave any items on the stairs. Make sure that there are handrails on both sides of the stairs and use them. Fix handrails that are broken or loose. Make sure that handrails are as long as the stairways. Check any carpeting to make sure that it is firmly attached to the stairs. Fix any carpet that is loose or worn. Avoid having throw rugs at the top or bottom of the stairs. If you do have  throw rugs, attach them to the floor with carpet tape. Make sure that you have a light switch at the top of the stairs and the bottom of the stairs. If you do not have them, ask someone to add them for you. What else can I do to help prevent falls? Wear shoes that: Do not have high heels. Have rubber bottoms. Are comfortable and fit you well. Are closed at the toe. Do not wear sandals. If you use a stepladder: Make sure that it is fully opened. Do not climb a closed stepladder. Make sure that both sides of the stepladder are locked into place. Ask someone to hold it for you, if possible. Clearly mark and make sure that you can see: Any grab bars or handrails. First and last  steps. Where the edge of each step is. Use tools that help you move around (mobility aids) if they are needed. These include: Canes. Walkers. Scooters. Crutches. Turn on the lights when you go into a dark area. Replace any light bulbs as soon as they burn out. Set up your furniture so you have a clear path. Avoid moving your furniture around. If any of your floors are uneven, fix them. If there are any pets around you, be aware of where they are. Review your medicines with your doctor. Some medicines can make you feel dizzy. This can increase your chance of falling. Ask your doctor what other things that you can do to help prevent falls. This information is not intended to replace advice given to you by your health care provider. Make sure you discuss any questions you have with your health care provider. Document Released: 02/24/2009 Document Revised: 10/06/2015 Document Reviewed: 06/04/2014 Elsevier Interactive Patient Education  2017 [employer].

## 2022-04-03 NOTE — Progress Notes (Signed)
I connected with Jon [name] today by telephone and verified that I am speaking with the correct person using two identifiers. Location patient: home Location provider: work Persons participating in the virtual visit: Genice Rouge LPN.   I discussed the limitations, risks, security and privacy concerns of performing an evaluation and management service by telephone and the availability of in person appointments. I also discussed with the patient that there may be a patient responsible charge related to this service. The patient expressed understanding and verbally consented to this telephonic visit.    Interactive audio and video telecommunications were attempted between this provider and patient, however failed, due to patient having technical difficulties OR patient did not have access to video capability.  We continued and completed visit with audio only.     Vital signs may be patient reported or missing.  Subjective:   Jon [name] is a 66 y.o. male who presents for an Initial Medicare Annual Wellness Visit.  Review of Systems     Cardiac Risk Factors include: advanced age (>79mn, >>33women);diabetes mellitus;dyslipidemia;hypertension;male gender     Objective:    Today's Vitals   04/03/22 0856  Weight: 185 lb (83.9 kg)  Height: '5\' 10"'$  (1.778 m)   Body mass index is 26.54 kg/m.     04/03/2022    9:01 AM 11/08/2020    3:37 AM 11/07/2020   10:27 PM 03/14/2020   10:03 AM 09/17/2019   10:11 PM 06/06/2017    2:49 PM 05/23/2017    1:23 PM  Advanced Directives  Does Patient Have a Medical Advance Directive? No No No No No No No  Would patient like information on creating a medical advance directive?  Yes (Inpatient - patient requests chaplain consult to create a medical advance directive) No - Patient declined No - Patient declined No - Patient declined Yes (Inpatient - patient requests chaplain consult to create a medical advance directive) Yes (Inpatient -  patient requests chaplain consult to create a medical advance directive)    Current Medications (verified) Outpatient Encounter Medications as of 04/03/2022  Medication Sig   amLODipine (NORVASC) 10 MG tablet TAKE 1 TABLET BY MOUTH DAILY   atorvastatin (LIPITOR) 80 MG tablet Take 1 tablet (80 mg total) by mouth daily.   benzonatate (TESSALON PERLES) 100 MG capsule Take 1-2 capsules (100-200 mg total) by mouth 3 (three) times daily as needed for cough.   clopidogrel (PLAVIX) 75 MG tablet Take 1 tablet (75 mg total) by mouth daily.   glimepiride (AMARYL) 4 MG tablet TAKE 2 TABLETS BY MOUTH DAILY  WITH BREAKFAST   glucose blood (ONETOUCH VERIO) test strip USE AS INSTRUCTED TO CHECK BLOOD SUGAR DAILY.   losartan (COZAAR) 50 MG tablet TAKE 1 TABLET BY MOUTH DAILY   metFORMIN (GLUCOPHAGE) 1000 MG tablet TAKE 1 TABLET BY MOUTH TWICE  DAILY WITH MEALS   TRULICITY 05.63MJS/9.7WYSOPN Inject 0.75 mg into the skin once a week.   Continuous Blood Gluc Receiver (FREESTYLE LIBRE 2 READER) DEVI Use as instructed. Check blood glucose level by fingerstick three times per day.  E11.65 (Patient not taking: Reported on 04/03/2022)   Continuous Blood Gluc Sensor (FREESTYLE LIBRE 2 SENSOR) MISC Use as instructed. Check blood glucose level by fingerstick three times per day. (Patient not taking: Reported on 04/03/2022)   ezetimibe (ZETIA) 10 MG tablet TAKE 1 TABLET BY MOUTH DAILY (Patient not taking: Reported on 04/03/2022)   No facility-administered encounter medications on file as of 04/03/2022.  Allergies (verified) Patient has no known allergies.   History: Past Medical History:  Diagnosis Date   Ascending aortic aneurysm ([place]) 01/17/2022   Carotid artery occlusion    Diabetes mellitus without complication ([place])    type II   History of kidney stones    Hyperlipidemia    Hypertension    Kidney stones    Stomach ulcer    Stroke [facility])    Dec. 14 2018   Past Surgical History:  Procedure Laterality  Date   CYSTOSCOPY Left 12/10/2016   Procedure: cystoscopy with left ureteral stone extraction ;  Surgeon: Kathie Rhodes, MD;  Location: [facility];  Service: Urology;  Laterality: Left;   HERNIA REPAIR     Stomach ulcer     Family History  Problem Relation Age of Onset   Diabetes Father    Stroke Father    Hypertension Father    Hypertension Mother    Diabetes Sister    Diabetes Son    Diabetes Daughter    Social History   Socioeconomic History   Marital status: Divorced    Spouse name: Not on file   Number of children: 4   Years of education: 16   Highest education level: Not on file  Occupational History   Occupation: [employer] driver  Tobacco Use   Smoking status: Never   Smokeless tobacco: Never  Vaping Use   Vaping Use: Never used  Substance and Sexual Activity   Alcohol use: Yes    Alcohol/week: 1.0 - 2.0 standard drink of alcohol    Types: 1 - 2 Glasses of wine per week    Comment: socially   Drug use: No   Sexual activity: Not Currently  Other Topics Concern   Not on file  Social History Narrative   Fun: Politics   Social Determinants of Health   Financial Resource Strain: Low Risk  (04/03/2022)   Overall Financial Resource Strain (CARDIA)    Difficulty of Paying Living Expenses: Not hard at all  Food Insecurity: No Food Insecurity (04/03/2022)   Hunger Vital Sign    Worried About Running Out of Food in the Last Year: Never true    Ran Out of Food in the Last Year: Never true  Transportation Needs: No Transportation Needs (04/03/2022)   PRAPARE - Hydrologist (Medical): No    Lack of Transportation (Non-Medical): No  Physical Activity: Insufficiently Active (04/03/2022)   Exercise Vital Sign    Days of Exercise per Week: 4 days    Minutes of Exercise per Session: 30 min  Stress: No Stress Concern Present (04/03/2022)   [place]    Feeling of Stress : Not at all   Social Connections: Not on file    Tobacco Counseling Counseling given: Not Answered   Clinical Intake:  Pre-visit preparation completed: Yes  Pain : No/denies pain     Nutritional Status: BMI 25 -29 Overweight Nutritional Risks: None Diabetes: Yes  How often do you need to have someone help you when you read instructions, pamphlets, or other written materials from your doctor or pharmacy?: 1 - Never  Diabetic? Yes Nutrition Risk Assessment:  Has the patient had any N/V/D within the last 2 months?  No  Does the patient have any non-healing wounds?  No  Has the patient had any unintentional weight loss or weight gain?  No   Diabetes:  Is the patient diabetic?  Yes  If  diabetic, was a CBG obtained today?  No  Did the patient bring in their glucometer from home?  No  How often do you monitor your CBG's? daily.   Financial Strains and Diabetes Management:  Are you having any financial strains with the device, your supplies or your medication? No .  Does the patient want to be seen by Chronic Care Management for management of their diabetes?  No  Would the patient like to be referred to a Nutritionist or for Diabetic Management?  No   Diabetic Exams:  Diabetic Eye Exam: Overdue for diabetic eye exam. Pt has been advised about the importance in completing this exam. Patient advised to call and schedule an eye exam. Diabetic Foot Exam: Overdue, Pt has been advised about the importance in completing this exam. Pt is scheduled for diabetic foot exam on next appointment .   Interpreter Needed?: No  Information entered by :: NAllen LPN   Activities of Daily Living    04/03/2022    9:02 AM  In your present state of health, do you have any difficulty performing the following activities:  Hearing? 0  Vision? 0  Difficulty concentrating or making decisions? 0  Walking or climbing stairs? 0  Dressing or bathing? 0  Doing errands, shopping? 0  Preparing Food and eating  ? N  Using the Toilet? N  In the past six months, have you accidently leaked urine? N  Do you have problems with loss of bowel control? N  Managing your Medications? N  Managing your Finances? N  Housekeeping or managing your Housekeeping? N    Patient Care Team: Gildardo Pounds, NP as PCP - General (Nurse Practitioner) Skeet Latch, MD as PCP - Cardiology (Cardiology)  Indicate any recent Medical Services you may have received from other than [facility] providers in the past year (date may be approximate).     Assessment:   This is a routine wellness examination for [place].  Hearing/Vision screen Vision Screening - Comments:: Regular eye exams, [place] [facility]  Dietary issues and exercise activities discussed: Current Exercise Habits: Home exercise routine, Type of exercise: walking, Time (Minutes): 30, Frequency (Times/Week): 4, Weekly Exercise (Minutes/Week): 120   Goals Addressed             This Visit's Progress    Patient Stated       04/03/2022, wants health to get better       Depression Screen    04/03/2022    9:02 AM 10/30/2021    3:15 PM 06/19/2021    3:15 PM 03/20/2021    4:16 PM 12/19/2020    4:41 PM 11/28/2020   10:52 AM 08/29/2020    4:05 PM  PHQ 2/9 Scores  PHQ - 2 Score 0 0 0 0 0 0 0  PHQ- 9 Score  0 0 0 0  0    Fall Risk    04/03/2022    9:02 AM 10/30/2021    3:15 PM 03/20/2021    4:15 PM 11/28/2020   10:52 AM 08/29/2020    4:00 PM  Fall Risk   Falls in the past year? 0 0 0 0 0  Number falls in past yr: 0 0 0 0 0  Injury with Fall? 0 0 0 0 0  Risk for fall due to : Medication side effect No Fall Risks No Fall Risks No Fall Risks   Follow up Falls prevention discussed;Education provided;Falls evaluation completed Falls evaluation completed       FALL  RISK PREVENTION PERTAINING TO THE HOME:  Any stairs in or around the home? Yes  If so, are there any without handrails? No  Home free of loose throw rugs in walkways, pet beds, electrical  cords, etc? Yes  Adequate lighting in your home to reduce risk of falls? Yes   ASSISTIVE DEVICES UTILIZED TO PREVENT FALLS:  Life alert? No  Use of a cane, [place] or w/c? No  Grab bars in the bathroom? No  Shower chair or bench in shower? No  Elevated toilet seat or a handicapped toilet? No   TIMED UP AND GO:  Was the test performed? No .       Cognitive Function:        04/03/2022    9:03 AM  6CIT Screen  What Year? 0 points  What month? 0 points  What time? 0 points  Count back from 20 0 points  Months in reverse 0 points  Repeat phrase 0 points  Total Score 0 points    Immunizations Immunization History  Administered Date(s) Administered   PFIZER(Purple Top)SARS-COV-2 Vaccination 07/26/2019, 08/15/2019, 02/22/2020   Pneumococcal Conjugate-13 08/24/2019    TDAP status: Due, Education has been provided regarding the importance of this vaccine. Advised may receive this vaccine at local pharmacy or Health Dept. Aware to provide a copy of the vaccination record if obtained from local pharmacy or Health Dept. Verbalized acceptance and understanding.  Flu Vaccine status: Declined, Education has been provided regarding the importance of this vaccine but patient still declined. Advised may receive this vaccine at local pharmacy or Health Dept. Aware to provide a copy of the vaccination record if obtained from local pharmacy or Health Dept. Verbalized acceptance and understanding.  Pneumococcal vaccine status: Up to date  Covid-19 vaccine status: Completed vaccines  Qualifies for Shingles Vaccine? Yes   Zostavax completed No   Shingrix Completed?: No.    Education has been provided regarding the importance of this vaccine. Patient has been advised to call insurance company to determine out of pocket expense if they have not yet received this vaccine. Advised may also receive vaccine at local pharmacy or Health Dept. Verbalized acceptance and understanding.  Screening  Tests Health Maintenance  Topic Date Due   Medicare Annual Wellness (AWV)  Never done   Zoster Vaccines- Shingrix (1 of 2) Never done   Diabetic kidney evaluation - Urine ACR  06/08/2017   FOOT EXAM  10/24/2019   Pneumonia Vaccine 24+ Years old (2 - PPSV23 or PCV20) 08/23/2020   COVID-19 Vaccine (4 - 2023-24 season) 01/12/2022   OPHTHALMOLOGY EXAM  03/01/2022   HEMOGLOBIN A1C  05/01/2022   Diabetic kidney evaluation - GFR measurement  10/31/2022   COLONOSCOPY (Pts 45-105yr Insurance coverage will need to be confirmed)  01/06/2024   Hepatitis C Screening  Completed   HPV VACCINES  Aged Out   INFLUENZA VACCINE  Discontinued    Health Maintenance  Health Maintenance Due  Topic Date Due   Medicare Annual Wellness (AWV)  Never done   Zoster Vaccines- Shingrix (1 of 2) Never done   Diabetic kidney evaluation - Urine ACR  06/08/2017   FOOT EXAM  10/24/2019   Pneumonia Vaccine 68 Years old (2 - PPSV23 or PCV20) 08/23/2020   COVID-19 Vaccine (4 - 2023-24 season) 01/12/2022   OPHTHALMOLOGY EXAM  03/01/2022    Colorectal cancer screening: Type of screening: Colonoscopy. Completed 01/05/2021. Repeat every 3 years  Lung Cancer Screening: (Low Dose CT Chest recommended if Age 68-80  years, 30 pack-year currently smoking OR have quit w/in 15years.) does not qualify.   Lung Cancer Screening Referral: no  Additional Screening:  Hepatitis C Screening: does qualify; Completed 06/13/2020  Vision Screening: Recommended annual ophthalmology exams for early detection of glaucoma and other disorders of the eye. Is the patient up to date with their annual eye exam?  Yes  Who is the provider or what is the name of the office in which the patient attends annual eye exams? [facility] If pt is not established with a provider, would they like to be referred to a provider to establish care? No .   Dental Screening: Recommended annual dental exams for proper oral hygiene  Community Resource  Referral / Chronic Care Management: CRR required this visit?  No   CCM required this visit?  No      Plan:     I have personally reviewed and noted the following in the patient's chart:   Medical and social history Use of alcohol, tobacco or illicit drugs  Current medications and supplements including opioid prescriptions. Patient is not currently taking opioid prescriptions. Functional ability and status Nutritional status Physical activity Advanced directives List of other physicians Hospitalizations, surgeries, and ER visits in previous 12 months Vitals Screenings to include cognitive, depression, and falls Referrals and appointments  In addition, I have reviewed and discussed with patient certain preventive protocols, quality metrics, and best practice recommendations. A written personalized care plan for preventive services as well as general preventive health recommendations were provided to patient.     Kellie Simmering, LPN   [id number]   Nurse Notes: none  Due to this being a virtual visit, the after visit summary with patients personalized plan was offered to patient via mail or my-chart. to pick up at office at next visit

## 2022-04-03 NOTE — Telephone Encounter (Signed)
Copied from [place] [phone]. Topic: General - Other >> Apr 03, 2022 10:19 AM Jon [name] wrote: Reason for CRM: Patient called in ask to speak with kelly about getting tthe TRULICITY 0.22 YU/6.6NP thru the [employer] Please call back

## 2022-04-03 NOTE — Telephone Encounter (Signed)
It looks like he has Medicare. Have we met with this patient before for patient assistance? I don't know if he'd be eligible for any Trulicity assistance unless he had assistance before, right?

## 2022-04-03 NOTE — Telephone Encounter (Signed)
Done. Rxs sent for the Accu Chek products.

## 2022-04-03 NOTE — Addendum Note (Signed)
Addended by: Daisy Blossom, Jayshon Dommer L on: 04/03/2022 11:07 AM   Modules accepted: Orders

## 2022-04-06 ENCOUNTER — Other Ambulatory Visit: Payer: Self-pay

## 2022-04-10 ENCOUNTER — Inpatient Hospital Stay: Payer: Medicare Other | Attending: Hematology and Oncology

## 2022-04-10 ENCOUNTER — Other Ambulatory Visit: Payer: Self-pay

## 2022-04-10 DIAGNOSIS — Z79899 Other long term (current) drug therapy: Secondary | ICD-10-CM | POA: Diagnosis not present

## 2022-04-10 DIAGNOSIS — D508 Other iron deficiency anemias: Secondary | ICD-10-CM

## 2022-04-10 DIAGNOSIS — D509 Iron deficiency anemia, unspecified: Secondary | ICD-10-CM | POA: Diagnosis not present

## 2022-04-10 LAB — CBC WITH DIFFERENTIAL (CANCER CENTER ONLY)
Abs Immature Granulocytes: 0.01 10*3/uL (ref 0.00–0.07)
Basophils Absolute: 0.1 10*3/uL (ref 0.0–0.1)
Basophils Relative: 1 %
Eosinophils Absolute: 0.3 10*3/uL (ref 0.0–0.5)
Eosinophils Relative: 6 %
HCT: 37.9 % — ABNORMAL LOW (ref 39.0–52.0)
Hemoglobin: 11.8 g/dL — ABNORMAL LOW (ref 13.0–17.0)
Immature Granulocytes: 0 %
Lymphocytes Relative: 21 %
Lymphs Abs: 1.1 10*3/uL (ref 0.7–4.0)
MCH: 24.6 pg — ABNORMAL LOW (ref 26.0–34.0)
MCHC: 31.1 g/dL (ref 30.0–36.0)
MCV: 79.1 fL — ABNORMAL LOW (ref 80.0–100.0)
Monocytes Absolute: 0.4 10*3/uL (ref 0.1–1.0)
Monocytes Relative: 7 %
Neutro Abs: 3.2 10*3/uL (ref 1.7–7.7)
Neutrophils Relative %: 65 %
Platelet Count: 262 10*3/uL (ref 150–400)
RBC: 4.79 MIL/uL (ref 4.22–5.81)
RDW: 17.5 % — ABNORMAL HIGH (ref 11.5–15.5)
WBC Count: 5 10*3/uL (ref 4.0–10.5)
nRBC: 0 % (ref 0.0–0.2)

## 2022-04-10 LAB — IRON AND IRON BINDING CAPACITY (CC-WL,HP ONLY)
Iron: 46 ug/dL (ref 45–182)
Saturation Ratios: 10 % — ABNORMAL LOW (ref 17.9–39.5)
TIBC: 454 ug/dL — ABNORMAL HIGH (ref 250–450)
UIBC: 408 ug/dL — ABNORMAL HIGH (ref 117–376)

## 2022-04-10 LAB — FERRITIN: Ferritin: 8 ng/mL — ABNORMAL LOW (ref 24–336)

## 2022-04-10 NOTE — Progress Notes (Signed)
HEMATOLOGY-ONCOLOGY TELEPHONE VISIT PROGRESS NOTE  I connected with our patient on 04/12/22 at 10:15 AM EST by telephone and verified that I am speaking with the correct person using two identifiers.  I discussed the limitations, risks, security and privacy concerns of performing an evaluation and management service by telephone and the availability of in person appointments.  I also discussed with the patient that there may be a patient responsible charge related to this service. The patient expressed understanding and agreed to proceed.   History of Present Illness: Jon [name] 66 y.o. male is here because of recent diagnosis of iron deficiency. She presents to the clinic via phone follow-up.   He reports to me that he has not been taking any oral iron.  He does not have any symptoms of iron deficiency.  He denies any fever fatigue or shortness of breath exertion or lightheadedness or dizziness.  He does not have any cravings for ice chips etc.    REVIEW OF SYSTEMS:   Constitutional: Denies fevers, chills or abnormal weight loss All other systems were reviewed with the patient and are negative. Observations/Objective:     Assessment Plan:  Iron deficiency anemia 10/30/2021: Hemoglobin 12.1, MCV 79, RDW 16.4, iron saturation 9%, ferritin 14, TIBC 448 Patient had an upper endoscopy and colonoscopy within the last year and therefore blood loss has been ruled out.   Current treatment: Oral iron therapy OTC 1 tablet daily  Lab review 04/10/2022: Hemoglobin 11.8, MCV 79.1, RDW 17.5, iron saturation 10%, TIBC 454, ferritin 8  I discussed with the patient that although hemoglobin is stable, he is still iron deficient. He has not been taking his oral iron supplementation.  He will start taking it at this time. Labs in 6 months and telephone visit 2 days later to discuss results   I discussed the assessment and treatment plan with the patient. The patient was provided an opportunity to ask  questions and all were answered. The patient agreed with the plan and demonstrated an understanding of the instructions. The patient was advised to call back or seek an in-person evaluation if the symptoms worsen or if the condition fails to improve as anticipated.   I provided 12 minutes of non-face-to-face time during this encounter.  This includes time for charting and coordination of care   Harriette Ohara, MD  I Gardiner Coins am scribing for Dr. Lindi Adie  I have reviewed the above documentation for accuracy and completeness, and I agree with the above.

## 2022-04-12 ENCOUNTER — Other Ambulatory Visit: Payer: Self-pay | Admitting: Family Medicine

## 2022-04-12 ENCOUNTER — Inpatient Hospital Stay (HOSPITAL_BASED_OUTPATIENT_CLINIC_OR_DEPARTMENT_OTHER): Payer: Medicare Other | Admitting: Hematology and Oncology

## 2022-04-12 DIAGNOSIS — D509 Iron deficiency anemia, unspecified: Secondary | ICD-10-CM

## 2022-04-12 DIAGNOSIS — E119 Type 2 diabetes mellitus without complications: Secondary | ICD-10-CM

## 2022-04-12 NOTE — Assessment & Plan Note (Signed)
10/30/2021: Hemoglobin 12.1, MCV 79, RDW 16.4, iron saturation 9%, ferritin 14, TIBC 448 Patient had an upper endoscopy and colonoscopy within the last year and therefore blood loss has been ruled out.   Current treatment: 1.  Oral iron therapy OTC 1 tablet daily  Lab review 04/10/2022: Hemoglobin 11.8, MCV 79.1, RDW 17.5, iron saturation 10%, TIBC 454, ferritin 8  I discussed with the patient that although hemoglobin is stable, he is still iron deficient.

## 2022-04-20 ENCOUNTER — Other Ambulatory Visit: Payer: Self-pay

## 2022-04-23 ENCOUNTER — Other Ambulatory Visit: Payer: Self-pay

## 2022-05-08 ENCOUNTER — Ambulatory Visit: Payer: Medicare Other | Attending: Nurse Practitioner | Admitting: Nurse Practitioner

## 2022-05-08 ENCOUNTER — Encounter: Payer: Self-pay | Admitting: Nurse Practitioner

## 2022-05-08 VITALS — BP 130/72 | HR 89 | Ht 71.0 in | Wt 186.6 lb

## 2022-05-08 DIAGNOSIS — E119 Type 2 diabetes mellitus without complications: Secondary | ICD-10-CM

## 2022-05-08 DIAGNOSIS — Z794 Long term (current) use of insulin: Secondary | ICD-10-CM | POA: Diagnosis not present

## 2022-05-08 DIAGNOSIS — I10 Essential (primary) hypertension: Secondary | ICD-10-CM

## 2022-05-08 DIAGNOSIS — E785 Hyperlipidemia, unspecified: Secondary | ICD-10-CM | POA: Diagnosis not present

## 2022-05-08 DIAGNOSIS — E1165 Type 2 diabetes mellitus with hyperglycemia: Secondary | ICD-10-CM

## 2022-05-08 LAB — POCT GLYCOSYLATED HEMOGLOBIN (HGB A1C): HbA1c, POC (controlled diabetic range): 6.7 % (ref 0.0–7.0)

## 2022-05-08 MED ORDER — LOSARTAN POTASSIUM 50 MG PO TABS: 50.0000 mg | ORAL_TABLET | Freq: Every day | ORAL | 1 refills | Status: DC

## 2022-05-08 MED ORDER — METFORMIN HCL 1000 MG PO TABS: 1000.0000 mg | ORAL_TABLET | Freq: Two times a day (BID) | ORAL | 1 refills | Status: DC

## 2022-05-08 MED ORDER — AMLODIPINE BESYLATE 10 MG PO TABS: 10.0000 mg | ORAL_TABLET | Freq: Every day | ORAL | 1 refills | Status: DC

## 2022-05-08 NOTE — Progress Notes (Signed)
Assessment & Plan:  Jon [name] was seen today for hypertension and diabetes.  Diagnoses and all orders for this visit:  Type 2 diabetes mellitus without complication, without long-term current use of insulin (HCC) -     POCT glycosylated hemoglobin (Hb A1C) -     CMP14+EGFR -     Microalbumin / creatinine urine ratio -     metFORMIN (GLUCOPHAGE) 1000 MG tablet; Take 1 tablet (1,000 mg total) by mouth 2 (two) times daily with a meal. -     Ambulatory referral to Ophthalmology  Primary hypertension -     CMP14+EGFR -     amLODipine (NORVASC) 10 MG tablet; Take 1 tablet (10 mg total) by mouth daily. -     losartan (COZAAR) 50 MG tablet; Take 1 tablet (50 mg total) by mouth daily. Continue all antihypertensives as prescribed.  Reminded to bring in blood pressure log for follow  up appointment.  RECOMMENDATIONS: DASH/Mediterranean Diets are healthier choices for HTN.    Dyslipidemia, goal LDL below 70 -     Lipid panel INSTRUCTIONS: Work on a low fat, heart healthy diet and participate in regular aerobic exercise program by working out at least 150 minutes per week; 5 days a week-30 minutes per day. Avoid red meat/beef/steak,  fried foods. junk foods, sodas, sugary drinks, unhealthy snacking, alcohol and smoking.  Drink at least 80 oz of water per day and monitor your carbohydrate intake daily.     Patient has been counseled on age-appropriate routine health concerns for screening and prevention. These are reviewed and up-to-date. Referrals have been placed accordingly. Immunizations are up-to-date or declined.    Subjective:   Chief Complaint  Patient presents with   Hypertension   Diabetes   [name] 66 y.o. male presents to office today for follow up to hypertension and diabetes.  He is doing well today without any issues or concerns.  He was diagnosed with COVID several months ago and states he had a persistent cough which is now resolved He will be traveling out of the  country in a month or so    DM 2 Diabetes is well-controlled with Trulicity 3.66 mg weekly, glimepiride 8 mg daily and metformin 1000 mg twice daily Lab Results  Component Value Date   HGBA1C 6.7 05/08/2022     HTN Blood pressure is well-controlled with amlodipine 10 mg daily and losartan 50 mg daily. BP Readings from Last 3 Encounters:  05/08/22 130/72  03/06/22 138/82  03/05/22 (!) 160/74    Review of Systems  Constitutional:  Negative for fever, malaise/fatigue and weight loss.  HENT: Negative.  Negative for nosebleeds.   Eyes: Negative.  Negative for blurred vision, double vision and photophobia.  Respiratory: Negative.  Negative for cough and shortness of breath.   Cardiovascular: Negative.  Negative for chest pain, palpitations and leg swelling.  Gastrointestinal: Negative.  Negative for heartburn, nausea and vomiting.  Musculoskeletal: Negative.  Negative for myalgias.  Neurological: Negative.  Negative for dizziness, focal weakness, seizures and headaches.  Psychiatric/Behavioral: Negative.  Negative for suicidal ideas.     Past Medical History:  Diagnosis Date   Ascending aortic aneurysm ([place]) 01/17/2022   Carotid artery occlusion    Diabetes mellitus without complication ([place])    type II   History of kidney stones    Hyperlipidemia    Hypertension    Kidney stones    Stomach ulcer    Stroke [facility])    Dec. 14 2018  Past Surgical History:  Procedure Laterality Date   CYSTOSCOPY Left 12/10/2016   Procedure: cystoscopy with left ureteral stone extraction ;  Surgeon: Kathie Rhodes, MD;  Location: [facility];  Service: Urology;  Laterality: Left;   HERNIA REPAIR     Stomach ulcer      Family History  Problem Relation Age of Onset   Diabetes Father    Stroke Father    Hypertension Father    Hypertension Mother    Diabetes Sister    Diabetes Son    Diabetes Daughter     Social History Reviewed with no changes to be made today.   Outpatient Medications  Prior to Visit  Medication Sig Dispense Refill   Accu-Chek Softclix Lancets lancets Check blood glucose level by fingerstick three times per day. 100 each 2   atorvastatin (LIPITOR) 80 MG tablet Take 1 tablet (80 mg total) by mouth daily. 90 tablet 3   Blood Glucose Monitoring Suppl (ACCU-CHEK GUIDE) w/Device KIT Check blood glucose level by fingerstick three times per day. 1 kit 0   clopidogrel (PLAVIX) 75 MG tablet Take 1 tablet (75 mg total) by mouth daily. 90 tablet 3   glimepiride (AMARYL) 4 MG tablet TAKE 2 TABLETS BY MOUTH DAILY  WITH BREAKFAST 200 tablet 0   glucose blood (ACCU-CHEK GUIDE) test strip Check blood glucose level by fingerstick three times per day. 326 each 2   TRULICITY 7.12 WP/8.0DX SOPN Inject 0.75 mg into the skin once a week.     amLODipine (NORVASC) 10 MG tablet TAKE 1 TABLET BY MOUTH DAILY 100 tablet 0   losartan (COZAAR) 50 MG tablet TAKE 1 TABLET BY MOUTH DAILY 90 tablet 1   metFORMIN (GLUCOPHAGE) 1000 MG tablet TAKE 1 TABLET BY MOUTH TWICE  DAILY WITH MEALS 180 tablet 0   benzonatate (TESSALON PERLES) 100 MG capsule Take 1-2 capsules (100-200 mg total) by mouth 3 (three) times daily as needed for cough. (Patient not taking: Reported on 05/08/2022) 30 capsule 0   ezetimibe (ZETIA) 10 MG tablet TAKE 1 TABLET BY MOUTH DAILY (Patient not taking: Reported on 05/08/2022) 90 tablet 3   No facility-administered medications prior to visit.    No Known Allergies     Objective:    BP 130/72   Pulse 89   Ht _0  (1.803 m)   Wt 186 lb 9.6 oz (84.6 kg)   SpO2 98%   BMI 26.03 kg/m  Wt Readings from Last 3 Encounters:  05/08/22 186 lb 9.6 oz (84.6 kg)  04/03/22 185 lb (83.9 kg)  03/06/22 181 lb (82.1 kg)    Physical Exam Vitals and nursing note reviewed.  Constitutional:      Appearance: He is well-developed.  HENT:     Head: Normocephalic and atraumatic.  Cardiovascular:     Rate and Rhythm: Normal rate and regular rhythm.     Heart sounds: Normal heart  sounds. No murmur heard.    No friction rub. No gallop.  Pulmonary:     Effort: Pulmonary effort is normal. No tachypnea or respiratory distress.     Breath sounds: Normal breath sounds. No decreased breath sounds, wheezing, rhonchi or rales.  Chest:     Chest wall: No tenderness.  Abdominal:     General: Bowel sounds are normal.     Palpations: Abdomen is soft.  Musculoskeletal:        General: Normal range of motion.     Cervical back: Normal range of motion.  Skin:  General: Skin is warm and dry.  Neurological:     Mental Status: He is alert and oriented to person, place, and time.     Coordination: Coordination normal.  Psychiatric:        Behavior: Behavior normal. Behavior is cooperative.        Thought Content: Thought content normal.        Judgment: Judgment normal.          Patient has been counseled extensively about nutrition and exercise as well as the importance of adherence with medications and regular follow-up. The patient was given clear instructions to go to ER or return to medical center if symptoms don't improve, worsen or new problems develop. The patient verbalized understanding.   Follow-up: Return in about 3 months (around 08/07/2022) for follow up end of march early april .   Gildardo Pounds, FNP-BC [facility] and [facility], [place]   05/08/2022, 11:23 AM

## 2022-05-09 LAB — LIPID PANEL
Chol/HDL Ratio: 3.1 ratio (ref 0.0–5.0)
Cholesterol, Total: 131 mg/dL (ref 100–199)
HDL: 42 mg/dL (ref >39)
LDL Chol Calc (NIH): 75 mg/dL (ref 0–99)
Triglycerides: 65 mg/dL (ref 0–149)
VLDL Cholesterol Cal: 14 mg/dL (ref 5–40)

## 2022-05-09 LAB — CMP14+EGFR
ALT: 15 IU/L (ref 0–44)
AST: 17 IU/L (ref 0–40)
Albumin/Globulin Ratio: 1.5 (ref 1.2–2.2)
Albumin: 4.4 g/dL (ref 3.9–4.9)
Alkaline Phosphatase: 76 IU/L (ref 44–121)
BUN/Creatinine Ratio: 13 (ref 10–24)
BUN: 11 mg/dL (ref 8–27)
Bilirubin Total: 0.5 mg/dL (ref 0.0–1.2)
CO2: 23 mmol/L (ref 20–29)
Calcium: 9.5 mg/dL (ref 8.6–10.2)
Chloride: 103 mmol/L (ref 96–106)
Creatinine, Ser: 0.88 mg/dL (ref 0.76–1.27)
Globulin, Total: 3 g/dL (ref 1.5–4.5)
Glucose: 110 mg/dL — ABNORMAL HIGH (ref 70–99)
Potassium: 4.8 mmol/L (ref 3.5–5.2)
Sodium: 141 mmol/L (ref 134–144)
Total Protein: 7.4 g/dL (ref 6.0–8.5)
eGFR: 94 mL/min/{1.73_m2} (ref >59)

## 2022-05-09 LAB — MICROALBUMIN / CREATININE URINE RATIO
Creatinine, Urine: 139.7 mg/dL
Microalb/Creat Ratio: 18 mg/g creat (ref 0–29)
Microalbumin, Urine: 25 ug/mL

## 2022-05-11 ENCOUNTER — Other Ambulatory Visit: Payer: Self-pay

## 2022-05-21 ENCOUNTER — Other Ambulatory Visit: Payer: Self-pay | Admitting: Nurse Practitioner

## 2022-05-21 DIAGNOSIS — I10 Essential (primary) hypertension: Secondary | ICD-10-CM

## 2022-05-21 NOTE — Telephone Encounter (Signed)
Pt seeking an emergency refill as he was told it would be another 3-4 day before Rx is delivered  Medication Refill - Medication: losartan (COZAAR) 50 MG tablet   Has the patient contacted their pharmacy? Yes.    Preferred Pharmacy (with phone number or street name):  [place] Phone: [phone]  Fax: [phone]     Has the patient been seen for an appointment in the last year OR does the patient have an upcoming appointment? Yes.    Agent: Please be advised that RX refills may take up to 3 business days. We ask that you follow-up with your pharmacy.

## 2022-05-22 ENCOUNTER — Other Ambulatory Visit: Payer: Self-pay

## 2022-05-22 MED ORDER — LOSARTAN POTASSIUM 50 MG PO TABS
50.0000 mg | ORAL_TABLET | Freq: Every day | ORAL | 0 refills | Status: DC
  Filled 2022-05-22: qty 7, 7d supply, fill #0

## 2022-05-22 NOTE — Telephone Encounter (Signed)
Requested Prescriptions  Pending Prescriptions Disp Refills   losartan (COZAAR) 50 MG tablet 7 tablet 0    Sig: Take 1 tablet (50 mg total) by mouth daily.     Cardiovascular:  Angiotensin Receptor Blockers Passed - 05/21/2022 12:16 PM      Passed - Cr in normal range and within 180 days    Creatinine, Ser  Date Value Ref Range Status  05/08/2022 0.88 0.76 - 1.27 mg/dL Final   Creatinine,U  Date Value Ref Range Status  06/08/2016 171.6 mg/dL Final         Passed - K in normal range and within 180 days    Potassium  Date Value Ref Range Status  05/08/2022 4.8 3.5 - 5.2 mmol/L Final         Passed - Patient is not pregnant      Passed - Last BP in normal range    BP Readings from Last 1 Encounters:  05/08/22 130/72         Passed - Valid encounter within last 6 months    Recent Outpatient Visits           2 weeks ago Type 2 diabetes mellitus without complication, without long-term current use of insulin ([place])   [place], [place] W, NP   6 months ago Type 2 diabetes mellitus without complication, without long-term current use of insulin ([place])   [place], [place] W, NP   11 months ago Type 2 diabetes mellitus without complication, without long-term current use of insulin [facility])   [place], [place] W, NP   1 year ago Type 2 diabetes mellitus without complication, without long-term current use of insulin [facility])   [place], [place] W, NP   1 year ago Type 2 diabetes mellitus without complication, without long-term current use of insulin [facility])   [place], Vernia Buff, NP       Future Appointments             In 2 months Gildardo Pounds, NP [place]

## 2022-07-18 ENCOUNTER — Other Ambulatory Visit: Payer: Self-pay | Admitting: Nurse Practitioner

## 2022-07-18 DIAGNOSIS — I63332 Cerebral infarction due to thrombosis of left posterior cerebral artery: Secondary | ICD-10-CM

## 2022-07-19 NOTE — Telephone Encounter (Signed)
Requested Prescriptions  Pending Prescriptions Disp Refills   clopidogrel (PLAVIX) 75 MG tablet [Pharmacy Med Name: Clopidogrel Bisulfate 75 MG Oral Tablet] 60 tablet 5    Sig: TAKE 1 TABLET BY MOUTH DAILY     Hematology: Antiplatelets - clopidogrel Failed - 07/18/2022 10:21 PM      Failed - HCT in normal range and within 180 days    HCT  Date Value Ref Range Status  04/10/2022 37.9 (L) 39.0 - 52.0 % Final   Hematocrit  Date Value Ref Range Status  10/30/2021 37.7 37.5 - 51.0 % Final         Failed - HGB in normal range and within 180 days    Hemoglobin  Date Value Ref Range Status  04/10/2022 11.8 (L) 13.0 - 17.0 g/dL Final  10/30/2021 12.1 (L) 13.0 - 17.7 g/dL Final         Passed - PLT in normal range and within 180 days    Platelets  Date Value Ref Range Status  10/30/2021 312 150 - 450 x10E3/uL Final   Platelet Count  Date Value Ref Range Status  04/10/2022 262 150 - 400 K/uL Final         Passed - Cr in normal range and within 360 days    Creatinine, Ser  Date Value Ref Range Status  05/08/2022 0.88 0.76 - 1.27 mg/dL Final   Creatinine,U  Date Value Ref Range Status  06/08/2016 171.6 mg/dL Final         Passed - Valid encounter within last 6 months    Recent Outpatient Visits           2 months ago Type 2 diabetes mellitus without complication, without long-term current use of insulin ([place])   [place], [place] W, NP   8 months ago Type 2 diabetes mellitus without complication, without long-term current use of insulin ([place])   [place], [place] W, NP   1 year ago Type 2 diabetes mellitus without complication, without long-term current use of insulin [facility])   [place], [place] W, NP   1 year ago Type 2 diabetes mellitus without complication, without long-term current use of insulin [facility])   [place], [place] W, NP   1 year ago Type 2 diabetes mellitus without complication, without long-term current use of insulin ([place])   [place], Vernia Buff, NP       Future Appointments             In 3 weeks Gildardo Pounds, NP [place]             atorvastatin (LIPITOR) 80 MG tablet [Pharmacy Med Name: Atorvastatin Calcium 80 MG Oral Tablet] 60 tablet 5    Sig: TAKE 1 TABLET BY MOUTH DAILY     Cardiovascular:  Antilipid - Statins Failed - 07/18/2022 10:21 PM      Failed - Lipid Panel in normal range within the last 12 months    Cholesterol, Total  Date Value Ref Range Status  05/08/2022 131 100 - 199 mg/dL Final   LDL Chol Calc (NIH)  Date Value Ref Range Status  05/08/2022 75 0 - 99 mg/dL Final   HDL  Date Value Ref Range Status  05/08/2022 42 >39 mg/dL Final   Triglycerides  Date  Value Ref Range Status  05/08/2022 65 0 - 149 mg/dL Final         Passed - Patient is not pregnant      Passed - Valid encounter within last 12 months    Recent Outpatient Visits           2 months ago Type 2 diabetes mellitus without complication, without long-term current use of insulin [facility])   [place], [place] W, NP   8 months ago Type 2 diabetes mellitus without complication, without long-term current use of insulin [facility])   [place], [place] W, NP   1 year ago Type 2 diabetes mellitus without complication, without long-term current use of insulin [facility])   [place], [place] W, NP   1 year ago Type 2 diabetes mellitus without complication, without long-term current use of insulin [facility])   [place], [place] W, NP   1 year ago Type 2 diabetes mellitus without complication, without long-term current use of insulin [facility])   [place], Vernia Buff, NP       Future Appointments             In 3 weeks Gildardo Pounds, NP [place]

## 2022-08-01 ENCOUNTER — Other Ambulatory Visit: Payer: Self-pay

## 2022-08-02 ENCOUNTER — Other Ambulatory Visit: Payer: Self-pay

## 2022-08-13 ENCOUNTER — Ambulatory Visit: Payer: Medicare Other | Attending: Nurse Practitioner | Admitting: Nurse Practitioner

## 2022-08-13 ENCOUNTER — Encounter: Payer: Self-pay | Admitting: Nurse Practitioner

## 2022-08-13 VITALS — BP 132/70 | HR 74 | Ht 71.0 in | Wt 182.4 lb

## 2022-08-13 DIAGNOSIS — Z23 Encounter for immunization: Secondary | ICD-10-CM | POA: Diagnosis not present

## 2022-08-13 DIAGNOSIS — I10 Essential (primary) hypertension: Secondary | ICD-10-CM

## 2022-08-13 DIAGNOSIS — E119 Type 2 diabetes mellitus without complications: Secondary | ICD-10-CM

## 2022-08-13 MED ORDER — LOSARTAN POTASSIUM 50 MG PO TABS: 50.0000 mg | ORAL_TABLET | Freq: Every day | ORAL | 3 refills | Status: DC

## 2022-08-13 MED ORDER — AMLODIPINE BESYLATE 10 MG PO TABS: 10.0000 mg | ORAL_TABLET | Freq: Every day | ORAL | 3 refills | Status: DC

## 2022-08-13 MED ORDER — GLIMEPIRIDE 4 MG PO TABS: 8.0000 mg | ORAL_TABLET | Freq: Every day | ORAL | 3 refills | Status: DC

## 2022-08-13 NOTE — Progress Notes (Signed)
Unintentional weight loss

## 2022-08-13 NOTE — Progress Notes (Signed)
Assessment & Plan:  Jon [name] was seen today for hypertension and diabetes.  Diagnoses and all orders for this visit:  Type 2 diabetes mellitus without complication, without long-term current use of insulin Decrease metformin dose to 500 mg BID -     glimepiride (AMARYL) 4 MG tablet; Take 2 tablets (8 mg total) by mouth daily with breakfast. -     CMP14+EGFR -     Hemoglobin A1c  Primary hypertension Well controlled.  -     losartan (COZAAR) 50 MG tablet; Take 1 tablet (50 mg total) by mouth daily. -     amLODipine (NORVASC) 10 MG tablet; Take 1 tablet (10 mg total) by mouth daily. -     CMP14+EGFR Continue all antihypertensives as prescribed.  Reminded to bring in blood pressure log for follow  up appointment.  RECOMMENDATIONS: DASH/Mediterranean Diets are healthier choices for HTN.    Need for vaccination with 20-polyvalent pneumococcal conjugate vaccine -     Pneumococcal conjugate vaccine 20-valent  Need for shingles vaccine -     Cancel: Varicella-zoster vaccine subcutaneous -     Varicella-zoster vaccine IM    Patient has been counseled on age-appropriate routine health concerns for screening and prevention. These are reviewed and up-to-date. Referrals have been placed accordingly. Immunizations are up-to-date or declined.    Subjective:   Chief Complaint  Patient presents with   Hypertension   Diabetes   [name] 67 y.o. male presents to office today for follow up to HTN and DM  He is doing well today. Concerned about weight loss. Dealing with a little job stress right now but nothing that is affecting his day to day activities.   HTN Blood pressure is well-controlled with amlodipine 10 mg daily and losartan 50 mg daily. BP Readings from Last 3 Encounters:  08/13/22 132/70  05/08/22 130/72  03/06/22 138/82      DM 2 Diabetes is well-controlled with Trulicity A999333 mg weekly, glimepiride 8 mg daily and metformin 1000 mg twice daily. He does note  several hypoglycemic episodes at home. At this time we will decrease his metformin dose.  Lab Results  Component Value Date   HGBA1C 6.7 05/08/2022    Review of Systems  Constitutional:  Negative for fever, malaise/fatigue and weight loss.  HENT: Negative.  Negative for nosebleeds.   Eyes: Negative.  Negative for blurred vision, double vision and photophobia.  Respiratory: Negative.  Negative for cough and shortness of breath.   Cardiovascular: Negative.  Negative for chest pain, palpitations and leg swelling.  Gastrointestinal: Negative.  Negative for heartburn, nausea and vomiting.  Musculoskeletal: Negative.  Negative for myalgias.  Neurological: Negative.  Negative for dizziness, focal weakness, seizures and headaches.  Psychiatric/Behavioral: Negative.  Negative for suicidal ideas.     Past Medical History:  Diagnosis Date   Ascending aortic aneurysm 01/17/2022   Carotid artery occlusion    Diabetes mellitus without complication    type II   History of kidney stones    Hyperlipidemia    Hypertension    Kidney stones    Stomach ulcer    Stroke    Dec. 14 2018    Past Surgical History:  Procedure Laterality Date   CYSTOSCOPY Left 12/10/2016   Procedure: cystoscopy with left ureteral stone extraction ;  Surgeon: Kathie Rhodes, MD;  Location: [facility];  Service: Urology;  Laterality: Left;   HERNIA REPAIR     Stomach ulcer      Family History  Problem  Relation Age of Onset   Diabetes Father    Stroke Father    Hypertension Father    Hypertension Mother    Diabetes Sister    Diabetes Son    Diabetes Daughter     Social History Reviewed with no changes to be made today.   Outpatient Medications Prior to Visit  Medication Sig Dispense Refill   Accu-Chek Softclix Lancets lancets Check blood glucose level by fingerstick three times per day. 100 each 2   atorvastatin (LIPITOR) 80 MG tablet TAKE 1 TABLET BY MOUTH DAILY 90 tablet 3   Blood Glucose Monitoring Suppl  (ACCU-CHEK GUIDE) w/Device KIT Check blood glucose level by fingerstick three times per day. 1 kit 0   clopidogrel (PLAVIX) 75 MG tablet TAKE 1 TABLET BY MOUTH DAILY 90 tablet 3   glucose blood (ACCU-CHEK GUIDE) test strip Check blood glucose level by fingerstick three times per day. 100 each 2   metFORMIN (GLUCOPHAGE) 1000 MG tablet Take 1 tablet (1,000 mg total) by mouth 2 (two) times daily with a meal. (Patient taking differently: Take 500 mg by mouth 2 (two) times daily with a meal.) A999333 tablet 1   TRULICITY A999333 0000000 SOPN Inject 0.75 mg into the skin once a week.     amLODipine (NORVASC) 10 MG tablet Take 1 tablet (10 mg total) by mouth daily. 100 tablet 1   glimepiride (AMARYL) 4 MG tablet TAKE 2 TABLETS BY MOUTH DAILY  WITH BREAKFAST 200 tablet 0   losartan (COZAAR) 50 MG tablet Take 1 tablet (50 mg total) by mouth daily. 7 tablet 0   No facility-administered medications prior to visit.    No Known Allergies     Objective:    BP 132/70   Pulse 74   Ht 5\' 11"  (1.803 m)   Wt 182 lb 6.4 oz (82.7 kg)   SpO2 98%   BMI 25.44 kg/m  Wt Readings from Last 3 Encounters:  08/13/22 182 lb 6.4 oz (82.7 kg)  05/08/22 186 lb 9.6 oz (84.6 kg)  04/03/22 185 lb (83.9 kg)    Physical Exam Vitals and nursing note reviewed.  Constitutional:      Appearance: He is well-developed.  HENT:     Head: Normocephalic and atraumatic.  Cardiovascular:     Rate and Rhythm: Normal rate and regular rhythm.     Heart sounds: Normal heart sounds. No murmur heard.    No friction rub. No gallop.  Pulmonary:     Effort: Pulmonary effort is normal. No tachypnea or respiratory distress.     Breath sounds: Normal breath sounds. No decreased breath sounds, wheezing, rhonchi or rales.  Chest:     Chest wall: No tenderness.  Abdominal:     General: Bowel sounds are normal.     Palpations: Abdomen is soft.  Musculoskeletal:        General: Normal range of motion.     Cervical back: Normal range of  motion.  Skin:    General: Skin is warm and dry.  Neurological:     Mental Status: He is alert and oriented to person, place, and time.     Coordination: Coordination normal.  Psychiatric:        Behavior: Behavior normal. Behavior is cooperative.        Thought Content: Thought content normal.        Judgment: Judgment normal.          Patient has been counseled extensively about nutrition and exercise as  well as the importance of adherence with medications and regular follow-up. The patient was given clear instructions to go to ER or return to medical center if symptoms don't improve, worsen or new problems develop. The patient verbalized understanding.   Follow-up: Return in about 3 months (around 11/12/2022).   Gildardo Pounds, FNP-BC [facility] and [place], [place]   08/13/2022, 12:09 PM

## 2022-08-14 LAB — CMP14+EGFR
ALT: 14 IU/L (ref 0–44)
AST: 19 IU/L (ref 0–40)
Albumin/Globulin Ratio: 1.5 (ref 1.2–2.2)
Albumin: 4.6 g/dL (ref 3.9–4.9)
Alkaline Phosphatase: 76 IU/L (ref 44–121)
BUN/Creatinine Ratio: 20 (ref 10–24)
BUN: 17 mg/dL (ref 8–27)
Bilirubin Total: 0.5 mg/dL (ref 0.0–1.2)
CO2: 23 mmol/L (ref 20–29)
Calcium: 9.8 mg/dL (ref 8.6–10.2)
Chloride: 103 mmol/L (ref 96–106)
Creatinine, Ser: 0.85 mg/dL (ref 0.76–1.27)
Globulin, Total: 3 g/dL (ref 1.5–4.5)
Glucose: 99 mg/dL (ref 70–99)
Potassium: 4.9 mmol/L (ref 3.5–5.2)
Sodium: 142 mmol/L (ref 134–144)
Total Protein: 7.6 g/dL (ref 6.0–8.5)
eGFR: 95 mL/min/{1.73_m2} (ref >59)

## 2022-08-14 LAB — HEMOGLOBIN A1C
Est. average glucose Bld gHb Est-mCnc: 151 mg/dL
Hgb A1c MFr Bld: 6.9 % — ABNORMAL HIGH (ref 4.8–5.6)

## 2022-08-21 ENCOUNTER — Ambulatory Visit: Payer: Self-pay | Admitting: *Deleted

## 2022-08-21 NOTE — Telephone Encounter (Signed)
Summary: Muscle pain in back/leg (left side)   Patient is experiencing left lower pain that goes into the front of his leg and knee. The pain feels like muscles being pulled. The symptoms have been going on for about 3 days         Reason for Disposition  [1] Pain radiates into the thigh or further down the leg AND [2] both legs  Answer Assessment - Initial Assessment Questions 1. ONSET: "When did the pain start?"      3 days 2. LOCATION: "Where is the pain located?"      LL leg- lower back to front of left leg- thigh to knee 3. PAIN: "How bad is the pain?"    (Scale 1-10; or mild, moderate, severe)   -  MILD (1-3): doesn't interfere with normal activities    -  MODERATE (4-7): interferes with normal activities (e.g., work or school) or awakens from sleep, limping    -  SEVERE (8-10): excruciating pain, unable to do any normal activities, unable to walk     moderate 4. WORK OR EXERCISE: "Has there been any recent work or exercise that involved this part of the body?"      no 5. CAUSE: "What do you think is causing the leg pain?"      possible back injury 6. OTHER SYMPTOMS: "Do you have any other symptoms?" (e.g., chest pain, back pain, breathing difficulty, swelling, rash, fever, numbness, weakness)     Back pain- left aide  Protocols used: Leg Pain-A-AH, Back Pain-A-AH

## 2022-08-21 NOTE — Telephone Encounter (Signed)
  Chief Complaint: LL back pain- radiating into front of thigh and knee. Symptoms: moderate to severe at times Frequency: 3 days Pertinent Negatives: Patient denies known injury to back/leg Disposition: [] ED /[] Urgent Care (no appt availability in office) / [] Appointment(In office/virtual)/ []  [facility]/ [] Home Care/ [x] Refused Recommended Disposition /[] [facility]/ []  Follow-up with PCP Additional Notes: Patient prefers to be seen at office- message sent to office to see if possible- options reviewed- [facility], mobile unit- but patient wants to be seen at office if possible- FC number - VM left, message sent

## 2022-08-22 ENCOUNTER — Encounter (HOSPITAL_BASED_OUTPATIENT_CLINIC_OR_DEPARTMENT_OTHER): Payer: Self-pay | Admitting: Student

## 2022-08-22 ENCOUNTER — Ambulatory Visit (HOSPITAL_BASED_OUTPATIENT_CLINIC_OR_DEPARTMENT_OTHER): Payer: Medicare Other

## 2022-08-22 ENCOUNTER — Ambulatory Visit (HOSPITAL_BASED_OUTPATIENT_CLINIC_OR_DEPARTMENT_OTHER): Payer: Medicare Other | Admitting: Student

## 2022-08-22 ENCOUNTER — Other Ambulatory Visit (HOSPITAL_BASED_OUTPATIENT_CLINIC_OR_DEPARTMENT_OTHER): Payer: Self-pay

## 2022-08-22 DIAGNOSIS — M545 Low back pain, unspecified: Secondary | ICD-10-CM

## 2022-08-22 DIAGNOSIS — M25552 Pain in left hip: Secondary | ICD-10-CM

## 2022-08-22 MED ORDER — MELOXICAM 15 MG PO TABS
15.0000 mg | ORAL_TABLET | Freq: Every day | ORAL | 0 refills | Status: AC
  Filled 2022-08-22: qty 7, 7d supply, fill #0

## 2022-08-22 NOTE — Progress Notes (Signed)
Chief Complaint: Lower back/hip pain     History of Present Illness:    Jon [name] is a 67 y.o. male presenting for evaluation of pain in his left lower back/hip area.  He states that this began about 3 days ago with no known injury.  The pain flared up last night to an 8/10 however is more moderate today.  The pain occasionally radiates around to the front of the thigh.  He does have occasional knee pain located more in the anterior knee.  He has tried taking some Advil for pain relief which has helped some.  Denies any other treatment.  Denies any groin pain or numbness or tingling in his leg.  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    Surgical History:   None  PMH/PSH/Family History/Social History/Meds/Allergies:    Past Medical History:  Diagnosis Date   Ascending aortic aneurysm 01/17/2022   Carotid artery occlusion    Diabetes mellitus without complication    type II   History of kidney stones    Hyperlipidemia    Hypertension    Kidney stones    Stomach ulcer    Stroke    Dec. 14 2018   Past Surgical History:  Procedure Laterality Date   CYSTOSCOPY Left 12/10/2016   Procedure: cystoscopy with left ureteral stone extraction ;  Surgeon: Ihor Gullyttelin, Mark, MD;  Location: [facility];  Service: Urology;  Laterality: Left;   HERNIA REPAIR     Stomach ulcer     Social History   Socioeconomic History   Marital status: Divorced    Spouse name: Not on file   Number of children: 4   Years of education: 16   Highest education level: Not on file  Occupational History   Occupation: [employer] driver  Tobacco Use   Smoking status: Never   Smokeless tobacco: Never  Vaping Use   Vaping Use: Never used  Substance and Sexual Activity   Alcohol use: Yes    Alcohol/week: 1.0 - 2.0 standard drink of alcohol    Types: 1 - 2 Glasses of wine per week    Comment:  socially   Drug use: No   Sexual activity: Not Currently  Other Topics Concern   Not on file  Social History Narrative   Fun: Politics   Social Determinants of Health   Financial Resource Strain: Low Risk  (04/03/2022)   Overall Financial Resource Strain (CARDIA)    Difficulty of Paying Living Expenses: Not hard at all  Food Insecurity: No Food Insecurity (04/03/2022)   Hunger Vital Sign    Worried About Running Out of Food in the Last Year: Never true    Ran Out of Food in the Last Year: Never true  Transportation Needs: No Transportation Needs (04/03/2022)   PRAPARE - Administrator, Civil ServiceTransportation    Lack of Transportation (Medical): No    Lack of Transportation (Non-Medical): No  Physical Activity: Insufficiently Active (04/03/2022)   Exercise Vital Sign    Days of Exercise per Week: 4 days    Minutes of Exercise per Session: 30 min  Stress: No Stress Concern Present (04/03/2022)   [employer] - Occupational Stress Questionnaire    Feeling of Stress : Not at  all  Social Connections: Not on file   Family History  Problem Relation Age of Onset   Diabetes Father    Stroke Father    Hypertension Father    Hypertension Mother    Diabetes Sister    Diabetes Son    Diabetes Daughter    No Known Allergies Current Outpatient Medications  Medication Sig Dispense Refill   meloxicam (MOBIC) 15 MG tablet Take 1 tablet (15 mg total) by mouth daily for 7 days. 7 tablet 0   Accu-Chek Softclix Lancets lancets Check blood glucose level by fingerstick three times per day. 100 each 2   amLODipine (NORVASC) 10 MG tablet Take 1 tablet (10 mg total) by mouth daily. 100 tablet 3   atorvastatin (LIPITOR) 80 MG tablet TAKE 1 TABLET BY MOUTH DAILY 90 tablet 3   Blood Glucose Monitoring Suppl (ACCU-CHEK GUIDE) w/Device KIT Check blood glucose level by fingerstick three times per day. 1 kit 0   clopidogrel (PLAVIX) 75 MG tablet TAKE 1 TABLET BY MOUTH DAILY 90 tablet 3    glimepiride (AMARYL) 4 MG tablet Take 2 tablets (8 mg total) by mouth daily with breakfast. 200 tablet 3   glucose blood (ACCU-CHEK GUIDE) test strip Check blood glucose level by fingerstick three times per day. 100 each 2   losartan (COZAAR) 50 MG tablet Take 1 tablet (50 mg total) by mouth daily. 100 tablet 3   metFORMIN (GLUCOPHAGE) 1000 MG tablet Take 1 tablet (1,000 mg total) by mouth 2 (two) times daily with a meal. (Patient taking differently: Take 500 mg by mouth 2 (two) times daily with a meal.) 200 tablet 1   TRULICITY 0.75 MG/0.5ML SOPN Inject 0.75 mg into the skin once a week.     No current facility-administered medications for this visit.   No results found.  Review of Systems:   A ROS was performed including pertinent positives and negatives as documented in the HPI.  Physical Exam :   Constitutional: NAD and appears stated age Neurological: Alert and oriented Psych: Appropriate affect and cooperative There were no vitals taken for this visit.   Comprehensive Musculoskeletal Exam:    Patient has no bony tenderness over lumbar spine.  Mild tenderness over greater trochanter and lateral hip.  Passive range of motion of the hip to around 20 degrees flexion with some tightness noted with internal and external rotation.  Hip and knee strength 5/5 flexion and extension.  He does have some noted tightness over his IT band left slightly more significant than right.  Negative straight leg raise.  Imaging:   Xray (lumbar spine 2 views): Negative for fracture or dislocation.  Well-preserved intervertebral disc spaces.   I personally reviewed and interpreted the radiographs.   Assessment:   67 y.o. male presenting with pain for the last 3 days in his low back and hip area.  Lumbar x-rays are negative for acute abnormalities however I suspect that some of this pain is radiating from his low back.  He also has some tightness noted in the hip and IT band which could also be  contributing.  I would like to send him for some physical therapy for his low back as well as hip strengthening and stretching.  I suspect that this will benefit his overall pain levels as well as improve the symptoms going down into his knee.  I will also send him 7 days of Mobic as he had some improvement with ibuprofen.  Discussed that we can see him  back in 4 to 6 weeks after working with therapy if symptoms have not significantly improved.  Plan :    -Referral to physical therapy and follow-up as needed     I personally saw and evaluated the patient, and participated in the management and treatment plan.  Hazle Nordmann, PA-C Orthopedics  This document was dictated using Conservation officer, historic buildings. A reasonable attempt at proof reading has been made to minimize errors.

## 2022-08-23 NOTE — Telephone Encounter (Signed)
Patient was seen at [facility] for this issues see encounter

## 2022-08-27 ENCOUNTER — Ambulatory Visit (INDEPENDENT_AMBULATORY_CARE_PROVIDER_SITE_OTHER): Payer: Medicare Other | Admitting: Student

## 2022-08-27 ENCOUNTER — Encounter (HOSPITAL_BASED_OUTPATIENT_CLINIC_OR_DEPARTMENT_OTHER): Payer: Self-pay

## 2022-08-27 ENCOUNTER — Other Ambulatory Visit (HOSPITAL_BASED_OUTPATIENT_CLINIC_OR_DEPARTMENT_OTHER): Payer: Self-pay

## 2022-08-27 ENCOUNTER — Telehealth: Payer: Self-pay | Admitting: Student

## 2022-08-27 DIAGNOSIS — M25562 Pain in left knee: Secondary | ICD-10-CM

## 2022-08-27 DIAGNOSIS — M545 Low back pain, unspecified: Secondary | ICD-10-CM | POA: Diagnosis not present

## 2022-08-27 MED ORDER — METHYLPREDNISOLONE 4 MG PO TBPK
ORAL_TABLET | ORAL | 0 refills | Status: DC
  Filled 2022-08-27: qty 21, 6d supply, fill #0

## 2022-08-27 NOTE — Telephone Encounter (Signed)
Patients job needs specific restrictions so they may find work for him, it has to be emailed to [email or web address] being their fax machine is not working please advise

## 2022-08-27 NOTE — Progress Notes (Signed)
Chief Complaint: Left thigh and knee pain     History of Present Illness:    Jon [name] is a 67 y.o. male presenting for follow-up of back and left leg pain.  Overall he states that his back is not in too much pain anymore he does feel mostly better.  His left thigh and knee however are now much worse.  He says the pain and tightness is through the front and outside of his left thigh and goes down into the middle of his left knee. This gets worse with walking or having to stand for periods of time.  He just finished the 7 days of Mobic which he did not notice improvement with.  He has been having trouble getting in and out of vehicles at work and reports having to sometimes help lift his leg in or out.  He has not gotten any PT scheduled yet.   08/22/2022: Presents for evaluation of pain in his left lower back/hip area.  He states that this began about 3 days ago with no known injury.  The pain flared up last night to an 8/10 however is more moderate today.  The pain occasionally radiates around to the front of the thigh.  He does have occasional knee pain located more in the anterior knee.  He has tried taking some Advil for pain relief which has helped some.  Denies any other treatment.  Denies any groin pain or numbness or tingling in his leg.  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    Surgical History:   None  PMH/PSH/Family History/Social History/Meds/Allergies:    Past Medical History:  Diagnosis Date   Ascending aortic aneurysm 01/17/2022   Carotid artery occlusion    Diabetes mellitus without complication    type II   History of kidney stones    Hyperlipidemia    Hypertension    Kidney stones    Stomach ulcer    Stroke    Dec. 14 2018   Past Surgical History:  Procedure Laterality Date   CYSTOSCOPY Left 12/10/2016   Procedure: cystoscopy with left  ureteral stone extraction ;  Surgeon: Ihor Gully, MD;  Location: [facility];  Service: Urology;  Laterality: Left;   HERNIA REPAIR     Stomach ulcer     Social History   Socioeconomic History   Marital status: Divorced    Spouse name: Not on file   Number of children: 4   Years of education: 16   Highest education level: Not on file  Occupational History   Occupation: [employer] driver  Tobacco Use   Smoking status: Never   Smokeless tobacco: Never  Vaping Use   Vaping Use: Never used  Substance and Sexual Activity   Alcohol use: Yes    Alcohol/week: 1.0 - 2.0 standard drink of alcohol    Types: 1 - 2 Glasses of wine per week    Comment: socially   Drug use: No   Sexual activity: Not Currently  Other Topics Concern   Not on file  Social History Narrative   Fun: Politics   Social Determinants of Health   Financial Resource Strain: Low Risk  (04/03/2022)   Overall Financial Resource Strain (CARDIA)  Difficulty of Paying Living Expenses: Not hard at all  Food Insecurity: No Food Insecurity (04/03/2022)   Hunger Vital Sign    Worried About Running Out of Food in the Last Year: Never true    Ran Out of Food in the Last Year: Never true  Transportation Needs: No Transportation Needs (04/03/2022)   PRAPARE - Administrator, Civil Service (Medical): No    Lack of Transportation (Non-Medical): No  Physical Activity: Insufficiently Active (04/03/2022)   Exercise Vital Sign    Days of Exercise per Week: 4 days    Minutes of Exercise per Session: 30 min  Stress: No Stress Concern Present (04/03/2022)   [employer] of Occupational [employer] - Occupational Stress Questionnaire    Feeling of Stress : Not at all  Social Connections: Not on file   Family History  Problem Relation Age of Onset   Diabetes Father    Stroke Father    Hypertension Father    Hypertension Mother    Diabetes Sister    Diabetes Son    Diabetes Daughter    No Known Allergies Current  Outpatient Medications  Medication Sig Dispense Refill   methylPREDNISolone (MEDROL DOSEPAK) 4 MG TBPK tablet Take per packet instructions 21 each 0   Accu-Chek Softclix Lancets lancets Check blood glucose level by fingerstick three times per day. 100 each 2   amLODipine (NORVASC) 10 MG tablet Take 1 tablet (10 mg total) by mouth daily. 100 tablet 3   atorvastatin (LIPITOR) 80 MG tablet TAKE 1 TABLET BY MOUTH DAILY 90 tablet 3   Blood Glucose Monitoring Suppl (ACCU-CHEK GUIDE) w/Device KIT Check blood glucose level by fingerstick three times per day. 1 kit 0   clopidogrel (PLAVIX) 75 MG tablet TAKE 1 TABLET BY MOUTH DAILY 90 tablet 3   glimepiride (AMARYL) 4 MG tablet Take 2 tablets (8 mg total) by mouth daily with breakfast. 200 tablet 3   glucose blood (ACCU-CHEK GUIDE) test strip Check blood glucose level by fingerstick three times per day. 100 each 2   losartan (COZAAR) 50 MG tablet Take 1 tablet (50 mg total) by mouth daily. 100 tablet 3   meloxicam (MOBIC) 15 MG tablet Take 1 tablet (15 mg total) by mouth daily for 7 days. 7 tablet 0   metFORMIN (GLUCOPHAGE) 1000 MG tablet Take 1 tablet (1,000 mg total) by mouth 2 (two) times daily with a meal. (Patient taking differently: Take 500 mg by mouth 2 (two) times daily with a meal.) 200 tablet 1   TRULICITY 0.75 MG/0.5ML SOPN Inject 0.75 mg into the skin once a week.     No current facility-administered medications for this visit.   No results found.  Review of Systems:   A ROS was performed including pertinent positives and negatives as documented in the HPI.  Physical Exam :   Constitutional: NAD and appears stated age Neurological: Alert and oriented Psych: Appropriate affect and cooperative There were no vitals taken for this visit.   Comprehensive Musculoskeletal Exam:    No tenderness to palpation over the low back or lumbar spine.  Full range of motion with flexion and extension at the lumbar spine with no pain.  Negative  straight leg raise.  Passive hip range of motion 120 degrees flexion and 20 degrees external/internal rotation.  Hip flexion strength 5/5.  Knee flexion and extension strength 5/5.  No tenderness about the knee with palpation however tightness  noted over IT band.  No laxity with varus/valgus  stress.  Negative Lachman's.   Imaging:    I personally reviewed and interpreted the radiographs.   Assessment:   67 y.o. male presenting with worsening left thigh and knee pain.  We discussed last time that this could be radiating from his low back or due to weakness/tightness in the hip and thigh.  Also discussed that there was evidence of a possible kidney stone on his previous x-ray however this does not correlate with his current symptoms.  He has not heard anything from physical therapy about scheduling so we will ensure they have that referral as I would like to get him started soon as possible.  I believe he would benefit from some stretching and strengthening of the hip/quadriceps as well as modalities for the low back.  I will also start him on a Medrol Dosepak as he did not have any benefit over the last week from meloxicam.  Plan :    -Begin physical therapy follow-up in 4 weeks for reevaluation     I personally saw and evaluated the patient, and participated in the management and treatment plan.  Hazle Nordmann, PA-C Orthopedics  This document was dictated using Conservation officer, historic buildings. A reasonable attempt at proof reading has been made to minimize errors.

## 2022-09-10 ENCOUNTER — Encounter: Payer: Self-pay | Admitting: Physical Therapy

## 2022-09-10 ENCOUNTER — Other Ambulatory Visit: Payer: Self-pay

## 2022-09-10 ENCOUNTER — Ambulatory Visit: Payer: Medicare Other | Admitting: Physical Therapy

## 2022-09-10 DIAGNOSIS — M5442 Lumbago with sciatica, left side: Secondary | ICD-10-CM | POA: Diagnosis not present

## 2022-09-10 DIAGNOSIS — G8929 Other chronic pain: Secondary | ICD-10-CM

## 2022-09-10 DIAGNOSIS — M79605 Pain in left leg: Secondary | ICD-10-CM | POA: Diagnosis not present

## 2022-09-10 DIAGNOSIS — M6281 Muscle weakness (generalized): Secondary | ICD-10-CM | POA: Diagnosis not present

## 2022-09-10 NOTE — Therapy (Addendum)
OUTPATIENT PHYSICAL THERAPY THORACOLUMBAR EVALUATION   Patient Name: Jon [name] MRN: [id number] DOB:[birth year], 67 y.o., male Today's Date: 09/10/2022  END OF SESSION:  PT End of Session - 09/10/22 1110     Visit Number 1    Number of Visits 12    Date for PT Re-Evaluation 11/09/22    PT Start Time 1104    PT Stop Time 1145    PT Time Calculation (min) 41 min    Activity Tolerance Patient tolerated treatment well    Behavior During Therapy [facility] for tasks assessed/performed             Past Medical History:  Diagnosis Date   Ascending aortic aneurysm (HCC) 01/17/2022   Carotid artery occlusion    Diabetes mellitus without complication (HCC)    type II   History of kidney stones    Hyperlipidemia    Hypertension    Kidney stones    Stomach ulcer    Stroke [facility])    Dec. 14 2018   Past Surgical History:  Procedure Laterality Date   CYSTOSCOPY Left 12/10/2016   Procedure: cystoscopy with left ureteral stone extraction ;  Surgeon: Ihor Gully, MD;  Location: [facility];  Service: Urology;  Laterality: Left;   HERNIA REPAIR     Stomach ulcer     Patient Active Problem List   Diagnosis Date Noted   Ascending aortic aneurysm (HCC) 01/17/2022   Iron deficiency anemia 12/06/2021   Chest pain 11/08/2020   Acute CVA (cerebrovascular accident) (HCC) 09/17/2019   Type 2 diabetes mellitus with vascular disease (HCC) 09/17/2019   CVA (cerebral vascular accident) (HCC) 04/26/2017   Routine general medical examination at a health care facility 06/04/2016   Trigger finger, acquired 02/24/2016   Erectile dysfunction 03/17/2015   Dyslipidemia 03/12/2015   HTN (hypertension) 03/12/2015    PCP: Claiborne Rigg, NP   REFERRING PROVIDER:  Amador Cunas, PA-C  REFERRING DIAG: Diagnosis M54.50 (ICD-10-CM) - Acute left-sided low back pain without sciatica  Rationale for Evaluation and Treatment: Rehabilitation  THERAPY DIAG:  Pain in left leg  Chronic left-sided low  back pain with left-sided sciatica  Muscle weakness (generalized)  ONSET DATE: worsening lately, but has been ongoing for about 3 years  SUBJECTIVE:                                                                                                                                                                                           SUBJECTIVE STATEMENT:  Presents for evaluation of pain in his left lower back/hip area.  He states that this began about 3 days ago with no  known injury.    The pain occasionally radiates around to the front of the thigh.  He does have occasional knee pain located more in the anterior knee.  He has tried taking some Advil for pain relief which has helped some.  Denies any other treatment.  Denies any groin pain or numbness or tingling in his leg.  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  Pt states Mobic  did not notice much  improvement.   PERTINENT HISTORY:   CVA (2018) with no residual sx, DM, HTN, and HLD, L thalamic infarct.  PAIN:  NPRS scale: 3/10, 8-9/10 Pain location: Left thigh, left low back Pain description: pulling, steady Aggravating factors: prolonged sitting, initial transfer, prolonged standing Relieving factors: ice, change positions  PRECAUTIONS: None  WEIGHT BEARING RESTRICTIONS: No  FALLS:  Has patient fallen in last 6 months? 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 There is mild loss of disc space height and endplate spurring at L4-5 and  L5-S1. Paraspinous structures demonstrate a 0.9 cm calcification projecting over the left kidney compatible with a stone. Large stool burden noted.  PATIENT SURVEYS:  09/10/22 FOTO eval:  47  SCREENING FOR RED FLAGS: Bowel or bladder incontinence: No Cauda equina syndrome: No  COGNITION: Overall cognitive status: WFL normal      SENSATION: WFL  MUSCLE LENGTH: Hamstrings: Right 78 deg; Left 70 deg   POSTURE: rounded shoulders and forward head  PALPATION: TTP: left QL  LUMBAR ROM:   AROM Eval 09/10/22  Flexion 60  Extension 15  Right lateral flexion   Left lateral flexion   Right rotation WFL, pain noted on left side  Left rotation WFL   (Blank rows = not tested)  LOWER EXTREMITY ROM:      Right 09/10/22 Left 09/10/22  Hip flexion 122 125  Hip extension    Hip abduction 35 35  Hip adduction    Hip internal rotation    Hip external rotation    Knee flexion    Knee extension    Ankle dorsiflexion    Ankle plantarflexion    Ankle inversion    Ankle eversion     (Blank rows = not tested)  LOWER EXTREMITY MMT:    MMT Right 09/10/22 Left 09/10/22  Hip flexion 5 4+  Hip extension    Hip abduction 5 5  Hip adduction 5 5  Hip internal rotation    Hip external rotation    Knee flexion 5 4+  Knee extension 5 5  Ankle dorsiflexion    Ankle plantarflexion    Ankle inversion    Ankle eversion     (Blank rows = not tested)  LUMBAR SPECIAL TESTS:  09/10/22 Slump test: Negative  FUNCTIONAL TESTS:  09/10/22: 5 times sit to stand: 14 seconds  GAIT: Distance walked: 30 feet on level surface Assistive device utilized: None Level of assistance: Complete Independence Comments: WFL  TODAY'S TREATMENT:  DATE: 10/05/22  Therex: HEP instruction/performance c cues for techniques, handout provided.  Trial set performed of each for comprehension  and symptom assessment.  See below for exercise list  PATIENT EDUCATION:  Education details: HEP, POC Person educated: Patient Education method: Explanation, Demonstration, Verbal cues, and Handouts Education comprehension: verbalized understanding, returned demonstration, and verbal cues required  HOME EXERCISE PROGRAM: Access Code: [email or web address] URL: [email or web address] Date: 09/10/2022 Prepared by: Narda Amber  Exercises - Supine Lower Trunk Rotation  - 2 x daily - 7 x weekly - 3 reps - 30 seconds hold - Hooklying Single Knee to Chest Stretch  - 2 x daily - 7 x weekly - 3 reps - 30 seconds hold - Supine Active Straight Leg Raise  - 2 x daily - 7 x weekly - 2 sets - 10 reps - Supine Bridge  - 2 x daily - 7 x weekly - 2 sets - 10 reps - 5 seconds hold - Seated Hamstring Stretch  - 2 x daily - 7 x weekly - 3 reps - 30 seconds hold  ASSESSMENT:  CLINICAL IMPRESSION: Patient is a 67 y.o. who comes to clinic with complaints of left LE pain and low back pain on the left side with mobility, strength and movement coordination deficits that impair their ability to perform usual daily and recreational functional activities without increase difficulty/symptoms at this time.  Patient to benefit from skilled PT services to address impairments and limitations to improve to previous level of function without restriction secondary to condition. Pt also instructed to f/u with PCP for possible referral to urologist to evaluate left renal stone which could be contributing to pt's low back pain.   OBJECTIVE IMPAIRMENTS: decreased mobility, difficulty walking, decreased ROM, decreased strength, and pain.   ACTIVITY LIMITATIONS: lifting, sitting, standing, sleeping, and transfers  PARTICIPATION LIMITATIONS: community activity and occupation  PERSONAL FACTORS: 3+ comorbidities: see above  are also affecting patient's functional outcome.   REHAB POTENTIAL: Good  CLINICAL DECISION MAKING:  Stable/uncomplicated  EVALUATION COMPLEXITY: Low   GOALS: Goals reviewed with patient? Yes  SHORT TERM GOALS: (target date for Short term goals are 3 weeks 10/05/22)  1. Patient will demonstrate independent use of home exercise program to maintain progress from in clinic treatments.  Goal status: New  LONG TERM GOALS: (target dates for all long term goals are 8  weeks  11/09/22)   1. Patient will demonstrate/report pain at worst less than or equal to 2/10 to facilitate minimal limitation in daily activity secondary to pain symptoms.  Goal status: New   2. Patient will demonstrate independent use of home exercise program to facilitate ability to maintain/progress functional gains from skilled physical therapy services.  Goal status: New   3. Patient will demonstrate FOTO outcome > or = 68 % to indicate reduced disability due to condition.  Goal status: New   4. Patient will demonstrate lumbar extension 100 % WFL s symptoms to facilitate upright standing, walking posture at PLOF s limitation.  Goal status: New   5.  Pt will be able to reporting pain <= 2/10 with standing and car transfers.   Goal status: New   6.  Pt will improve his left LE strength to 5/5 for improved functional mobility.  Goal status: New     PLAN:  PT FREQUENCY: 1-2x/week  PT DURATION: 8 weeks  PLANNED INTERVENTIONS: Therapeutic exercises, Therapeutic activity, Neuro Muscular re-education, Balance training, Gait training, Patient/Family education, Joint mobilization, Stair training, DME instructions, Dry Needling, Electrical  stimulation, Cryotherapy, vasopneumatic device, Moist heat, Taping, Traction Ultrasound, Ionotophoresis 4mg /ml Dexamethasone, and aquatic therapy, Manual therapy.  All included unless contraindicated  PLAN FOR NEXT SESSION: Review HEP knowledge/results, Lumbar mobs, core strengthening, LE strengthening,   Recommended pt f/u with his PCP about Urology referral for left renal stone  which may be contributing to pt's back pain.        Sharmon Leyden, PT, MPT 09/10/2022, 12:46 PM

## 2022-09-10 NOTE — Addendum Note (Signed)
Addended by: Sharmon Leyden on: 09/10/2022 12:48 PM   Modules accepted: Orders

## 2022-09-17 ENCOUNTER — Encounter: Payer: Self-pay | Admitting: Physical Therapy

## 2022-09-17 ENCOUNTER — Ambulatory Visit: Payer: Medicare Other | Admitting: Physical Therapy

## 2022-09-17 DIAGNOSIS — M79605 Pain in left leg: Secondary | ICD-10-CM | POA: Diagnosis not present

## 2022-09-17 DIAGNOSIS — M6281 Muscle weakness (generalized): Secondary | ICD-10-CM | POA: Diagnosis not present

## 2022-09-17 DIAGNOSIS — G8929 Other chronic pain: Secondary | ICD-10-CM | POA: Diagnosis not present

## 2022-09-17 DIAGNOSIS — M5442 Lumbago with sciatica, left side: Secondary | ICD-10-CM

## 2022-09-17 NOTE — Therapy (Signed)
OUTPATIENT PHYSICAL THERAPY THORACOLUMBAR TREATMENT   Patient Name: Jon [name] MRN: [id number] DOB:1956-04-07, 67 y.o., male Today's Date: 09/17/2022  END OF SESSION:  PT End of Session - 09/17/22 1026     Visit Number 2    Number of Visits 12    Date for PT Re-Evaluation 11/09/22    PT Start Time 1018    PT Stop Time 1056    PT Time Calculation (min) 38 min    Activity Tolerance Patient tolerated treatment well    Behavior During Therapy [facility] for tasks assessed/performed              Past Medical History:  Diagnosis Date   Ascending aortic aneurysm (HCC) 01/17/2022   Carotid artery occlusion    Diabetes mellitus without complication (HCC)    type II   History of kidney stones    Hyperlipidemia    Hypertension    Kidney stones    Stomach ulcer    Stroke [facility])    Dec. 14 2018   Past Surgical History:  Procedure Laterality Date   CYSTOSCOPY Left 12/10/2016   Procedure: cystoscopy with left ureteral stone extraction ;  Surgeon: Ihor Gully, MD;  Location: [facility];  Service: Urology;  Laterality: Left;   HERNIA REPAIR     Stomach ulcer     Patient Active Problem List   Diagnosis Date Noted   Ascending aortic aneurysm (HCC) 01/17/2022   Iron deficiency anemia 12/06/2021   Chest pain 11/08/2020   Acute CVA (cerebrovascular accident) (HCC) 09/17/2019   Type 2 diabetes mellitus with vascular disease (HCC) 09/17/2019   CVA (cerebral vascular accident) (HCC) 04/26/2017   Routine general medical examination at a health care facility 06/04/2016   Trigger finger, acquired 02/24/2016   Erectile dysfunction 03/17/2015   Dyslipidemia 03/12/2015   HTN (hypertension) 03/12/2015    PCP: Claiborne Rigg, NP   REFERRING PROVIDER:  Amador Cunas, PA-C  REFERRING DIAG: Diagnosis M54.50 (ICD-10-CM) - Acute left-sided low back pain without sciatica  Rationale for Evaluation and Treatment: Rehabilitation  THERAPY DIAG:  Pain in left leg  Chronic left-sided low  back pain with left-sided sciatica  Muscle weakness (generalized)  ONSET DATE: worsening lately, but has been ongoing for about 3 years  SUBJECTIVE:                                                                                                                                                                                           SUBJECTIVE STATEMENT:   Doing OK, still waiting from results/word from PCP about follow-up from the kidney stone. Drove about 3 hours yesterday and now  feeling pain in my thigh but not in the knee. Have been doing HEP as well as I can, inconsistent because of my schedule.   PERTINENT HISTORY:   CVA (2018) with no residual sx, DM, HTN, and HLD, L thalamic infarct.  PAIN:  NPRS scale: 1-2/10 Pain location: Left thigh, left low back Pain description: "bearable"  Aggravating factors: nothing  Relieving factors: not sure "my normal routine is what I'm doing"   PRECAUTIONS: None  WEIGHT BEARING RESTRICTIONS: No  FALLS:  Has patient fallen in last 6 months? 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 There is mild loss of disc space height and endplate spurring at L4-5 and L5-S1. Paraspinous structures demonstrate a 0.9 cm calcification projecting over the left kidney compatible with a stone. Large stool burden noted.  PATIENT SURVEYS:  09/10/22 FOTO eval:  47  SCREENING FOR RED FLAGS: Bowel or bladder incontinence: No Cauda equina syndrome: No  COGNITION: Overall cognitive status: WFL normal      SENSATION: WFL  MUSCLE LENGTH: Hamstrings:  Right 78 deg; Left 70 deg   POSTURE: rounded shoulders and forward head  PALPATION: TTP: left QL  LUMBAR ROM:   AROM Eval 09/10/22  Flexion 60  Extension 15  Right lateral flexion   Left lateral flexion   Right rotation WFL, pain noted on left side  Left rotation WFL   (Blank rows = not tested)  LOWER EXTREMITY ROM:      Right 09/10/22 Left 09/10/22  Hip flexion 122 125  Hip extension    Hip abduction 35 35  Hip adduction    Hip internal rotation    Hip external rotation    Knee flexion    Knee extension    Ankle dorsiflexion    Ankle plantarflexion    Ankle inversion    Ankle eversion     (Blank rows = not tested)  LOWER EXTREMITY MMT:    MMT Right 09/10/22 Left 09/10/22  Hip flexion 5 4+  Hip extension    Hip abduction 5 5  Hip adduction 5 5  Hip internal rotation    Hip external rotation    Knee flexion 5 4+  Knee extension 5 5  Ankle dorsiflexion    Ankle plantarflexion    Ankle inversion    Ankle eversion     (Blank rows = not tested)  LUMBAR SPECIAL TESTS:  09/10/22 Slump test: Negative  FUNCTIONAL TESTS:  09/10/22: 5 times sit to stand: 14 seconds  GAIT: Distance walked: 30 feet on level surface Assistive device utilized: None Level of assistance: Complete Independence Comments: WFL  TODAY'S TREATMENT:  DATE:   09/17/22  TherEx  Nustep L5x6 minutes BLEs only (dropped to L4 due to quad symptoms) Lumbar rotation stretches 5x5 second holds B  SKTC 5x5 second holds B PPT x15 with 3 second hols PPT + march x15 MOD CUES  Curl ups x15  Wiggle worms for obliques  x20 total Opposite UE/LE raises + TA set x12 B QL stretch forward only 3x30 seconds seated with clinic stool- increased thigh sx Repeated standing lumbar extensions x15      10/05/22  Therex: HEP instruction/performance c cues for techniques, handout  provided.  Trial set performed of each for comprehension and symptom assessment.  See below for exercise list  PATIENT EDUCATION:  Education details: HEP, POC Person educated: Patient Education method: Explanation, Demonstration, Verbal cues, and Handouts Education comprehension: verbalized understanding, returned demonstration, and verbal cues required  HOME EXERCISE PROGRAM: Access Code: [email or web address] URL: [email or web address] Date: 09/10/2022 Prepared by: Narda Amber  Exercises - Supine Lower Trunk Rotation  - 2 x daily - 7 x weekly - 3 reps - 30 seconds hold - Hooklying Single Knee to Chest Stretch  - 2 x daily - 7 x weekly - 3 reps - 30 seconds hold - Supine Active Straight Leg Raise  - 2 x daily - 7 x weekly - 2 sets - 10 reps - Supine Bridge  - 2 x daily - 7 x weekly - 2 sets - 10 reps - 5 seconds hold - Seated Hamstring Stretch  - 2 x daily - 7 x weekly - 3 reps - 30 seconds hold  ASSESSMENT:  CLINICAL IMPRESSION:  Chayce arrives today doing OK, pain is better in general and PCP is still working on follow-up for kidney stone. Focused on LE strengthening, core strength, and lumbar mobility today as per POC, did well but really needed a ot of heavy cuing for good form with exercises. Seemed to have some increase in thigh sx with repeated flexion, responded well to repeated lumbar extension- will assess more next visit.   OBJECTIVE IMPAIRMENTS: decreased mobility, difficulty walking, decreased ROM, decreased strength, and pain.   ACTIVITY LIMITATIONS: lifting, sitting, standing, sleeping, and transfers  PARTICIPATION LIMITATIONS: community activity and occupation  PERSONAL FACTORS: 3+ comorbidities: see above  are also affecting patient's functional outcome.   REHAB POTENTIAL: Good  CLINICAL DECISION MAKING: Stable/uncomplicated  EVALUATION COMPLEXITY: Low   GOALS: Goals reviewed with patient? Yes  SHORT TERM GOALS: (target date for Short term goals  are 3 weeks 10/05/22)  1. Patient will demonstrate independent use of home exercise program to maintain progress from in clinic treatments.  Goal status: New  LONG TERM GOALS: (target dates for all long term goals are 8  weeks  11/09/22)   1. Patient will demonstrate/report pain at worst less than or equal to 2/10 to facilitate minimal limitation in daily activity secondary to pain symptoms.  Goal status: New   2. Patient will demonstrate independent use of home exercise program to facilitate ability to maintain/progress functional gains from skilled physical therapy services.  Goal status: New   3. Patient will demonstrate FOTO outcome > or = 68 % to indicate reduced disability due to condition.  Goal status: New   4. Patient will demonstrate lumbar extension 100 % WFL s symptoms to facilitate upright standing, walking posture at PLOF s limitation.  Goal status: New   5.  Pt will be able to reporting pain <= 2/10 with standing and car transfers.   Goal status: New  6.  Pt will improve his left LE strength to 5/5 for improved functional mobility.  Goal status: New     PLAN:  PT FREQUENCY: 1-2x/week  PT DURATION: 8 weeks  PLANNED INTERVENTIONS: Therapeutic exercises, Therapeutic activity, Neuro Muscular re-education, Balance training, Gait training, Patient/Family education, Joint mobilization, Stair training, DME instructions, Dry Needling, Electrical stimulation, Cryotherapy, vasopneumatic device, Moist heat, Taping, Traction Ultrasound, Ionotophoresis 4mg /ml Dexamethasone, and aquatic therapy, Manual therapy.  All included unless contraindicated  PLAN FOR NEXT SESSION: Review HEP knowledge/results, Lumbar mobs, core strengthening, LE strengthening. Try more repeated lumbar extension work- possible centralization of symptoms, need to investigate more   Recommended pt f/u with his PCP about Urology referral for left renal stone which may be contributing to pt's back  pain.    Nedra Hai PT DPT PN2

## 2022-09-24 ENCOUNTER — Ambulatory Visit (HOSPITAL_BASED_OUTPATIENT_CLINIC_OR_DEPARTMENT_OTHER): Payer: Medicare Other | Admitting: Student

## 2022-09-24 DIAGNOSIS — M25562 Pain in left knee: Secondary | ICD-10-CM | POA: Diagnosis not present

## 2022-09-24 NOTE — Progress Notes (Signed)
Chief Complaint: Left thigh and knee pain     History of Present Illness:    Jon [name] is a 67 y.o. male presenting for 4-week follow-up of left knee and thigh pain.  He has been going to physical therapy for his knee and low back which he says has been helping so far.  He states that he does still continue to get pain around the area just above his kneecap.  He does notice this more when he is inactive or resting, particularly in a seated position, but does not notice the pain often while he is up and moving around.  He has been back at his job performing full duties.  States that his back is feeling significantly better now and has little to no pain.  He is following up on the potential kidney stone found on x-ray with his primary doctor.   08/22/2022: Presents for evaluation of pain in his left lower back/hip area.  He states that this began about 3 days ago with no known injury.  The pain flared up last night to an 8/10 however is more moderate today.  The pain occasionally radiates around to the front of the thigh.  He does have occasional knee pain located more in the anterior knee.  He has tried taking some Advil for pain relief which has helped some.  Denies any other treatment.  Denies any groin pain or numbness or tingling in his leg.  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    Surgical History:   None  PMH/PSH/Family History/Social History/Meds/Allergies:    Past Medical History:  Diagnosis Date   Ascending aortic aneurysm (HCC) 01/17/2022   Carotid artery occlusion    Diabetes mellitus without complication (HCC)    type II   History of kidney stones    Hyperlipidemia    Hypertension    Kidney stones    Stomach ulcer    Stroke [facility])    Dec. 14 2018   Past Surgical History:  Procedure Laterality Date   CYSTOSCOPY Left 12/10/2016   Procedure:  cystoscopy with left ureteral stone extraction ;  Surgeon: Ihor Gully, MD;  Location: [facility];  Service: Urology;  Laterality: Left;   HERNIA REPAIR     Stomach ulcer     Social History   Socioeconomic History   Marital status: Divorced    Spouse name: Not on file   Number of children: 4   Years of education: 16   Highest education level: Not on file  Occupational History   Occupation: [employer] driver  Tobacco Use   Smoking status: Never   Smokeless tobacco: Never  Vaping Use   Vaping Use: Never used  Substance and Sexual Activity   Alcohol use: Yes    Alcohol/week: 1.0 - 2.0 standard drink of alcohol    Types: 1 - 2 Glasses of wine per week    Comment: socially   Drug use: No   Sexual activity: Not Currently  Other Topics Concern   Not on file  Social History Narrative   Fun: Politics   Social Determinants of Health   Financial Resource Strain: Low Risk  (04/03/2022)   Overall Financial Resource Strain (CARDIA)    Difficulty of  Paying Living Expenses: Not hard at all  Food Insecurity: No Food Insecurity (04/03/2022)   Hunger Vital Sign    Worried About Running Out of Food in the Last Year: Never true    Ran Out of Food in the Last Year: Never true  Transportation Needs: No Transportation Needs (04/03/2022)   PRAPARE - Administrator, Civil Service (Medical): No    Lack of Transportation (Non-Medical): No  Physical Activity: Insufficiently Active (04/03/2022)   Exercise Vital Sign    Days of Exercise per Week: 4 days    Minutes of Exercise per Session: 30 min  Stress: No Stress Concern Present (04/03/2022)   [employer] of Occupational Health - Occupational Stress Questionnaire    Feeling of Stress : Not at all  Social Connections: Not on file   Family History  Problem Relation Age of Onset   Diabetes Father    Stroke Father    Hypertension Father    Hypertension Mother    Diabetes Sister    Diabetes Son    Diabetes Daughter    No Known  Allergies Current Outpatient Medications  Medication Sig Dispense Refill   Accu-Chek Softclix Lancets lancets Check blood glucose level by fingerstick three times per day. 100 each 2   amLODipine (NORVASC) 10 MG tablet Take 1 tablet (10 mg total) by mouth daily. 100 tablet 3   atorvastatin (LIPITOR) 80 MG tablet TAKE 1 TABLET BY MOUTH DAILY 90 tablet 3   Blood Glucose Monitoring Suppl (ACCU-CHEK GUIDE) w/Device KIT Check blood glucose level by fingerstick three times per day. 1 kit 0   clopidogrel (PLAVIX) 75 MG tablet TAKE 1 TABLET BY MOUTH DAILY 90 tablet 3   glimepiride (AMARYL) 4 MG tablet Take 2 tablets (8 mg total) by mouth daily with breakfast. 200 tablet 3   glucose blood (ACCU-CHEK GUIDE) test strip Check blood glucose level by fingerstick three times per day. 100 each 2   losartan (COZAAR) 50 MG tablet Take 1 tablet (50 mg total) by mouth daily. 100 tablet 3   metFORMIN (GLUCOPHAGE) 1000 MG tablet Take 1 tablet (1,000 mg total) by mouth 2 (two) times daily with a meal. (Patient taking differently: Take 500 mg by mouth 2 (two) times daily with a meal.) 200 tablet 1   methylPREDNISolone (MEDROL DOSEPAK) 4 MG TBPK tablet Take per packet instructions 21 each 0   TRULICITY 0.75 MG/0.5ML SOPN Inject 0.75 mg into the skin once a week.     No current facility-administered medications for this visit.   No results found.  Review of Systems:   A ROS was performed including pertinent positives and negatives as documented in the HPI.  Physical Exam :   Constitutional: NAD and appears stated age Neurological: Alert and oriented Psych: Appropriate affect and cooperative There were no vitals taken for this visit.   Comprehensive Musculoskeletal Exam:    No tenderness to palpation throughout the knee joint.  Patella is nontender and mobile.  Active range of motion of the left knee is 0 to 120 degrees without crepitus.  Knee flexion and extension strength 5/5.  No laxity with varus or valgus  stress.  Passive hip range of motion to 120 degrees flexion 30 degrees external rotation and 20 degrees internal rotation.  5/5 strength with hip flexion extension.   Imaging:    I personally reviewed and interpreted the radiographs.   Assessment:   67 y.o. male with left knee and thigh pain consistent with patellofemoral syndrome.  At this point I would like him to continue with physical therapy as this already has helped him improve his symptoms some and I believe he will benefit with continued strengthening particularly of the quadriceps.  Discussed that our plan will be to assess his progress in 4 to 6 weeks and return to clinic if needed if symptoms are persistent or have worsen for any reason, at which point I would recommend obtaining x-rays to rule out patellofemoral arthritic changes.  Plan :    -Continue physical therapy and return to clinic as needed     I personally saw and evaluated the patient, and participated in the management and treatment plan.  Hazle Nordmann, PA-C Orthopedics  This document was dictated using Conservation officer, historic buildings. A reasonable attempt at proof reading has been made to minimize errors.

## 2022-09-25 ENCOUNTER — Ambulatory Visit: Payer: Medicare Other | Admitting: Physical Therapy

## 2022-09-25 ENCOUNTER — Encounter: Payer: Self-pay | Admitting: Physical Therapy

## 2022-09-25 DIAGNOSIS — M5442 Lumbago with sciatica, left side: Secondary | ICD-10-CM | POA: Diagnosis not present

## 2022-09-25 DIAGNOSIS — M6281 Muscle weakness (generalized): Secondary | ICD-10-CM

## 2022-09-25 DIAGNOSIS — G8929 Other chronic pain: Secondary | ICD-10-CM

## 2022-09-25 DIAGNOSIS — M79605 Pain in left leg: Secondary | ICD-10-CM

## 2022-09-25 NOTE — Therapy (Signed)
OUTPATIENT PHYSICAL THERAPY THORACOLUMBAR TREATMENT   Patient Name: Jon [name] MRN: [id number] DOB:11/27/1955, 67 y.o., male Today's Date: 09/25/2022  END OF SESSION:  PT End of Session - 09/25/22 1139     Visit Number 3    Number of Visits 12    Date for PT Re-Evaluation 11/09/22    PT Start Time 1100    PT Stop Time 1140    PT Time Calculation (min) 40 min    Activity Tolerance Patient tolerated treatment well    Behavior During Therapy [facility] for tasks assessed/performed              Past Medical History:  Diagnosis Date   Ascending aortic aneurysm (HCC) 01/17/2022   Carotid artery occlusion    Diabetes mellitus without complication (HCC)    type II   History of kidney stones    Hyperlipidemia    Hypertension    Kidney stones    Stomach ulcer    Stroke [facility])    Dec. 14 2018   Past Surgical History:  Procedure Laterality Date   CYSTOSCOPY Left 12/10/2016   Procedure: cystoscopy with left ureteral stone extraction ;  Surgeon: Ihor Gully, MD;  Location: [facility];  Service: Urology;  Laterality: Left;   HERNIA REPAIR     Stomach ulcer     Patient Active Problem List   Diagnosis Date Noted   Ascending aortic aneurysm (HCC) 01/17/2022   Iron deficiency anemia 12/06/2021   Chest pain 11/08/2020   Acute CVA (cerebrovascular accident) (HCC) 09/17/2019   Type 2 diabetes mellitus with vascular disease (HCC) 09/17/2019   CVA (cerebral vascular accident) (HCC) 04/26/2017   Routine general medical examination at a health care facility 06/04/2016   Trigger finger, acquired 02/24/2016   Erectile dysfunction 03/17/2015   Dyslipidemia 03/12/2015   HTN (hypertension) 03/12/2015    PCP: Claiborne Rigg, NP   REFERRING PROVIDER:  Amador Cunas, PA-C  REFERRING DIAG: Diagnosis M54.50 (ICD-10-CM) - Acute left-sided low back pain without sciatica  Rationale for Evaluation and Treatment: Rehabilitation  THERAPY DIAG:  Pain in left leg  Chronic left-sided low  back pain with left-sided sciatica  Muscle weakness (generalized)  ONSET DATE: worsening lately, but has been ongoing for about 3 years  SUBJECTIVE:                                                                                                                                                                                           SUBJECTIVE STATEMENT:  Pt stating still reporting left LE pain and left sided low back pain of 2/10 upon arrival. Pt stating his HEP is  going well.   PERTINENT HISTORY:   CVA (2018) with no residual sx, DM, HTN, and HLD, L thalamic infarct.  PAIN:  NPRS scale: 2/10 Pain location: Left thigh, left low back Pain description: "bearable"  Aggravating factors: nothing  Relieving factors: not sure "my normal routine is what I'm doing"   PRECAUTIONS: None  WEIGHT BEARING RESTRICTIONS: No  FALLS:  Has patient fallen in last 6 months? 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 There is mild loss of disc space height and endplate spurring at L4-5 and L5-S1. Paraspinous structures demonstrate a 0.9 cm calcification projecting over the left kidney compatible with a stone. Large stool burden noted.  PATIENT SURVEYS:  09/10/22 FOTO eval:  47  SCREENING FOR RED FLAGS: Bowel or bladder incontinence: No Cauda equina syndrome: No  COGNITION: Overall cognitive status: WFL normal      SENSATION: WFL  MUSCLE LENGTH: Hamstrings: Right 78 deg; Left 70 deg   POSTURE: rounded shoulders and forward head  PALPATION: TTP: left QL  LUMBAR ROM:   AROM  Eval 09/10/22  Flexion 60  Extension 15  Right lateral flexion   Left lateral flexion   Right rotation WFL, pain noted on left side  Left rotation WFL   (Blank rows = not tested)  LOWER EXTREMITY ROM:      Right 09/10/22 Left 09/10/22  Hip flexion 122 125  Hip extension    Hip abduction 35 35  Hip adduction    Hip internal rotation    Hip external rotation    Knee flexion    Knee extension    Ankle dorsiflexion    Ankle plantarflexion    Ankle inversion    Ankle eversion     (Blank rows = not tested)  LOWER EXTREMITY MMT:    MMT Right 09/10/22 Left 09/10/22  Hip flexion 5 4+  Hip extension    Hip abduction 5 5  Hip adduction 5 5  Hip internal rotation    Hip external rotation    Knee flexion 5 4+  Knee extension 5 5  Ankle dorsiflexion    Ankle plantarflexion    Ankle inversion    Ankle eversion     (Blank rows = not tested)  LUMBAR SPECIAL TESTS:  09/10/22 Slump test: Negative  FUNCTIONAL TESTS:  09/10/22: 5 times sit to stand: 14 seconds  GAIT: Distance walked: 30 feet on level surface Assistive device utilized: None Level of assistance: Complete Independence Comments: WFL  TODAY'S TREATMENT:  DATE:  09/25/22 TherEx Nustep L5x6 minutes UE/LE seat 11 Lumbar rotation stretches 5x5 second holds B  PPT x15 with 3 second hols PPT + march x15 MOD CUES  Prone elbow press ups: x 10  Quadraped: cat/camel x 10 holding 10 seconds Modified child's pose: x 5 holding 10 sec Repeated standing lumbar extensions elbows on wall  x15 holding 5 sec Bil QL door stretch x 5 holding 10 sec Standing mini squats against wall with red physioball in pt's low back x 10     09/17/22 TherEx  Nustep L5x6 minutes BLEs only (dropped to L4 due to quad symptoms) Lumbar rotation stretches 5x5 second holds B  SKTC 5x5 second holds B PPT x15 with 3 second  hols PPT + march x15 MOD CUES  Curl ups x15  Wiggle worms for obliques  x20 total Opposite UE/LE raises + TA set x12 B QL stretch forward only 3x30 seconds seated with clinic stool- increased thigh sx Repeated standing lumbar extensions x15      10/05/22  Therex: HEP instruction/performance c cues for techniques, handout provided.  Trial set performed of each for comprehension and symptom assessment.  See below for exercise list  PATIENT EDUCATION:  Education details: HEP, POC Person educated: Patient Education method: Explanation, Demonstration, Verbal cues, and Handouts Education comprehension: verbalized understanding, returned demonstration, and verbal cues required  HOME EXERCISE PROGRAM: Access Code: [email or web address] URL: [email or web address] Date: 09/25/2022 Prepared by: Narda Amber  Exercises - Supine Lower Trunk Rotation  - 2 x daily - 7 x weekly - 3 reps - 30 seconds hold - Hooklying Single Knee to Chest  - 2 x daily - 7 x weekly - 3 reps - 30 seconds hold - Supine Active Straight Leg Raise  - 2 x daily - 7 x weekly - 2 sets - 10 reps - Supine Bridge  - 2 x daily - 7 x weekly - 2 sets - 10 reps - 5 seconds hold - Seated Hamstring Stretch  - 2 x daily - 7 x weekly - 3 reps - 30 seconds hold - Standing Quadratus Lumborum Stretch with Doorway  - 2 x daily - 7 x weekly - 3 reps - 10 seconds hold - Standing Thoracic Extension at Wall  - 2 x daily - 7 x weekly - 10 reps - 10 seconds hold - Quadruped Thoracic Spine Extension  - 2 x daily - 7 x weekly - 5 reps - 10 seconds  hold - Cat Cow  - 2 x daily - 7 x weekly - 10 reps - 10 seconds hold  ASSESSMENT:  CLINICAL IMPRESSION: Pt with better response to extension based exercises with increased radicular symptoms with flexion based exercises. Pt's HEP was updated this visit. Continue to progress as pt tolerates to maximize pt's function.   OBJECTIVE IMPAIRMENTS: decreased mobility, difficulty walking, decreased  ROM, decreased strength, and pain.   ACTIVITY LIMITATIONS: lifting, sitting, standing, sleeping, and transfers  PARTICIPATION LIMITATIONS: community activity and occupation  PERSONAL FACTORS: 3+ comorbidities: see above  are also affecting patient's functional outcome.   REHAB POTENTIAL: Good  CLINICAL DECISION MAKING: Stable/uncomplicated  EVALUATION COMPLEXITY: Low   GOALS: Goals reviewed with patient? Yes  SHORT TERM GOALS: (target date for Short term goals are 3 weeks 10/05/22)  1. Patient will demonstrate independent use of home exercise program to maintain progress from in clinic treatments.  Goal status: On-going: 09/25/22  LONG TERM GOALS: (target dates for all long term goals are 8  weeks  11/09/22)   1. Patient will demonstrate/report pain at worst less than or equal to 2/10 to facilitate minimal limitation in daily activity secondary to pain symptoms.  Goal status: On-going 09/25/22   2. Patient will demonstrate independent use of home exercise program to facilitate ability to maintain/progress functional gains from skilled physical therapy services.  Goal status: New   3. Patient will demonstrate FOTO outcome > or = 68 % to indicate reduced disability due to condition.  Goal status: New   4. Patient will demonstrate lumbar extension 100 % WFL s symptoms to facilitate upright standing, walking posture at PLOF s limitation.  Goal status: New   5.  Pt will be able to reporting pain <= 2/10 with standing and car transfers.   Goal status: New   6.  Pt will improve his left LE strength to 5/5 for improved functional mobility.  Goal status: New     PLAN:  PT FREQUENCY: 1-2x/week  PT DURATION: 8 weeks  PLANNED INTERVENTIONS: Therapeutic exercises, Therapeutic activity, Neuro Muscular re-education, Balance training, Gait training, Patient/Family education, Joint mobilization, Stair training, DME instructions, Dry Needling, Electrical stimulation, Cryotherapy,  vasopneumatic device, Moist heat, Taping, Traction Ultrasound, Ionotophoresis 4mg /ml Dexamethasone, and aquatic therapy, Manual therapy.  All included unless contraindicated  PLAN FOR NEXT SESSION: Review HEP knowledge/results, Lumbar mobs, core strengthening, LE strengthening. Continue to work on extension based exercises.   Pt stating he scheduled a f/u with his PCP about Urology referral for left renal stone which may be contributing to pt's back pain.    Narda Amber, PT, MPT 09/25/22 11:43 AM

## 2022-10-01 ENCOUNTER — Ambulatory Visit: Payer: Medicare Other | Admitting: Physical Therapy

## 2022-10-01 ENCOUNTER — Encounter: Payer: Self-pay | Admitting: Physical Therapy

## 2022-10-01 DIAGNOSIS — M6281 Muscle weakness (generalized): Secondary | ICD-10-CM

## 2022-10-01 DIAGNOSIS — M5442 Lumbago with sciatica, left side: Secondary | ICD-10-CM | POA: Diagnosis not present

## 2022-10-01 DIAGNOSIS — G8929 Other chronic pain: Secondary | ICD-10-CM | POA: Diagnosis not present

## 2022-10-01 DIAGNOSIS — M79605 Pain in left leg: Secondary | ICD-10-CM

## 2022-10-01 NOTE — Therapy (Addendum)
OUTPATIENT PHYSICAL THERAPY THORACOLUMBAR TREATMENT /DISCHARGE   Patient Name: Jon [name] MRN: [id number] DOB:[birth year], 67 y.o., male Today's Date: 10/01/2022  END OF SESSION:  PT End of Session - 10/01/22 1141     Visit Number 4    Number of Visits 12    Date for PT Re-Evaluation 11/09/22    PT Start Time 1102    PT Stop Time 1140    PT Time Calculation (min) 38 min    Activity Tolerance Patient tolerated treatment well    Behavior During Therapy [facility] for tasks assessed/performed              Past Medical History:  Diagnosis Date   Ascending aortic aneurysm (HCC) 01/17/2022   Carotid artery occlusion    Diabetes mellitus without complication (HCC)    type II   History of kidney stones    Hyperlipidemia    Hypertension    Kidney stones    Stomach ulcer    Stroke [facility])    Dec. 14 2018   Past Surgical History:  Procedure Laterality Date   CYSTOSCOPY Left 12/10/2016   Procedure: cystoscopy with left ureteral stone extraction ;  Surgeon: Ihor Gully, MD;  Location: [facility];  Service: Urology;  Laterality: Left;   HERNIA REPAIR     Stomach ulcer     Patient Active Problem List   Diagnosis Date Noted   Ascending aortic aneurysm (HCC) 01/17/2022   Iron deficiency anemia 12/06/2021   Chest pain 11/08/2020   Acute CVA (cerebrovascular accident) (HCC) 09/17/2019   Type 2 diabetes mellitus with vascular disease (HCC) 09/17/2019   CVA (cerebral vascular accident) (HCC) 04/26/2017   Routine general medical examination at a health care facility 06/04/2016   Trigger finger, acquired 02/24/2016   Erectile dysfunction 03/17/2015   Dyslipidemia 03/12/2015   HTN (hypertension) 03/12/2015    PCP: Claiborne Rigg, NP   REFERRING PROVIDER:  Amador Cunas, PA-C  REFERRING DIAG: Diagnosis M54.50 (ICD-10-CM) - Acute left-sided low back pain without sciatica  Rationale for Evaluation and Treatment: Rehabilitation  THERAPY DIAG:  Pain in left leg  Chronic  left-sided low back pain with left-sided sciatica  Muscle weakness (generalized)  ONSET DATE: worsening lately, but has been ongoing for about 3 years  SUBJECTIVE:                                                                                                                                                                                           SUBJECTIVE STATEMENT: No pain reported upon arrival. Pt did report some stiffness this morning but after moving around it was better.   PERTINENT  HISTORY:   CVA (2018) with no residual sx, DM, HTN, and HLD, L thalamic infarct.  PAIN:  NPRS scale: no pain at rest upon arrival  Pain location: Left thigh, left low back Pain description: "bearable"  Aggravating factors: nothing  Relieving factors: not sure "my normal routine is what I'm doing"   PRECAUTIONS: None  WEIGHT BEARING RESTRICTIONS: No  FALLS:  Has patient fallen in last 6 months? 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 There is mild loss of disc space height and endplate spurring at L4-5 and L5-S1. Paraspinous structures demonstrate a 0.9 cm calcification projecting over the left kidney compatible with a stone. Large stool burden noted.  PATIENT SURVEYS:  09/10/22 FOTO eval:  47  SCREENING FOR RED FLAGS: Bowel or bladder incontinence: No Cauda equina syndrome: No  COGNITION: Overall cognitive status: WFL normal      SENSATION: WFL  MUSCLE LENGTH: Hamstrings: Right 78 deg; Left 70 deg   POSTURE: rounded shoulders and forward head  PALPATION: TTP: left  QL  LUMBAR ROM:   AROM Eval 09/10/22  Flexion 60  Extension 15  Right lateral flexion   Left lateral flexion   Right rotation WFL, pain noted on left side  Left rotation WFL   (Blank rows = not tested)  LOWER EXTREMITY ROM:      Right 09/10/22 Left 09/10/22  Hip flexion 122 125  Hip extension    Hip abduction 35 35  Hip adduction    Hip internal rotation    Hip external rotation    Knee flexion    Knee extension    Ankle dorsiflexion    Ankle plantarflexion    Ankle inversion    Ankle eversion     (Blank rows = not tested)  LOWER EXTREMITY MMT:    MMT Right 09/10/22 Left 09/10/22  Hip flexion 5 4+  Hip extension    Hip abduction 5 5  Hip adduction 5 5  Hip internal rotation    Hip external rotation    Knee flexion 5 4+  Knee extension 5 5  Ankle dorsiflexion    Ankle plantarflexion    Ankle inversion    Ankle eversion     (Blank rows = not tested)  LUMBAR SPECIAL TESTS:  09/10/22 Slump test: Negative  FUNCTIONAL TESTS:  09/10/22: 5 times sit to stand: 14 seconds  GAIT: Distance walked: 30 feet on level surface Assistive device utilized: None Level of assistance: Complete Independence Comments: WFL  TODAY'S TREATMENT:  DATE:  10/01/22 TherEx Nustep L5 x 9 minutes UE/LE seat 11 Standing hip extension: x 15 each LE Leg Press: 75# 2 x 10 bil LE's QL door stretch: x 2 holding 20 sec bil  Door stretch: 90 degrees shoulder flexion holding 20 sec Standing trunk extension elbows on wall x 5 holding 10 sec Quadraped: cat/cow x 10 holding 10 sec Quadraped: opposite arm/leg 2 x 10 holding 5 seconds Trunk rotation in supine holding 20 sec x 3 bil sides Bird Dog: x 20 alternating, cues for core activation     09/25/22 TherEx Nustep L5x6 minutes UE/LE seat 11 Lumbar rotation stretches 5x5 second holds B  PPT x15 with 3 second  hols PPT + march x15 MOD CUES  Prone elbow press ups: x 10  Quadraped: cat/camel x 10 holding 10 seconds Modified child's pose: x 5 holding 10 sec Repeated standing lumbar extensions elbows on wall  x15 holding 5 sec Bil QL door stretch x 5 holding 10 sec Standing mini squats against wall with red physioball in pt's low back x 10     09/17/22 TherEx  Nustep L5x6 minutes BLEs only (dropped to L4 due to quad symptoms) Lumbar rotation stretches 5x5 second holds B  SKTC 5x5 second holds B PPT x15 with 3 second hols PPT + march x15 MOD CUES  Curl ups x15  Wiggle worms for obliques  x20 total Opposite UE/LE raises + TA set x12 B QL stretch forward only 3x30 seconds seated with clinic stool- increased thigh sx Repeated standing lumbar extensions x15    10/05/22  Therex: HEP instruction/performance c cues for techniques, handout provided.  Trial set performed of each for comprehension and symptom assessment.  See below for exercise list  PATIENT EDUCATION:  Education details: HEP, POC Person educated: Patient Education method: Explanation, Demonstration, Verbal cues, and Handouts Education comprehension: verbalized understanding, returned demonstration, and verbal cues required  HOME EXERCISE PROGRAM: Access Code: [email or web address] URL: [email or web address] Date: 09/25/2022 Prepared by: Narda Amber  Exercises - Supine Lower Trunk Rotation  - 2 x daily - 7 x weekly - 3 reps - 30 seconds hold - Hooklying Single Knee to Chest  - 2 x daily - 7 x weekly - 3 reps - 30 seconds hold - Supine Active Straight Leg Raise  - 2 x daily - 7 x weekly - 2 sets - 10 reps - Supine Bridge  - 2 x daily - 7 x weekly - 2 sets - 10 reps - 5 seconds hold - Seated Hamstring Stretch  - 2 x daily - 7 x weekly - 3 reps - 30 seconds hold - Standing Quadratus Lumborum Stretch with Doorway  - 2 x daily - 7 x weekly - 3 reps - 10 seconds hold - Standing Thoracic Extension at Wall  - 2 x daily - 7 x  weekly - 10 reps - 10 seconds hold - Quadruped Thoracic Spine Extension  - 2 x daily - 7 x weekly - 5 reps - 10 seconds  hold - Cat Cow  - 2 x daily - 7 x weekly - 10 reps - 10 seconds hold  ASSESSMENT:  CLINICAL IMPRESSION: Pt reporting no pain at rest today.  Pt tolerating exercises for lumbar stretching and core strengthening well. Pt states he will be out of town for 2 weeks coming up and will return after his vacation. Pt encouraged to continue with his updated HEP. Continue to progress as pt tolerates to maximize pt's function.   OBJECTIVE IMPAIRMENTS:  decreased mobility, difficulty walking, decreased ROM, decreased strength, and pain.   ACTIVITY LIMITATIONS: lifting, sitting, standing, sleeping, and transfers  PARTICIPATION LIMITATIONS: community activity and occupation  PERSONAL FACTORS: 3+ comorbidities: see above  are also affecting patient's functional outcome.   REHAB POTENTIAL: Good  CLINICAL DECISION MAKING: Stable/uncomplicated  EVALUATION COMPLEXITY: Low   GOALS: Goals reviewed with patient? Yes  SHORT TERM GOALS: (target date for Short term goals are 3 weeks 10/05/22)  1. Patient will demonstrate independent use of home exercise program to maintain progress from in clinic treatments.  Goal status: MET 10/01/22  LONG TERM GOALS: (target dates for all long term goals are 8  weeks  11/09/22)   1. Patient will demonstrate/report pain at worst less than or equal to 2/10 to facilitate minimal limitation in daily activity secondary to pain symptoms.  Goal status: On-going 10/01/22   2. Patient will demonstrate independent use of home exercise program to facilitate ability to maintain/progress functional gains from skilled physical therapy services.  Goal status: New   3. Patient will demonstrate FOTO outcome > or = 68 % to indicate reduced disability due to condition.  Goal status: New   4. Patient will demonstrate lumbar extension 100 % WFL s symptoms to  facilitate upright standing, walking posture at PLOF s limitation.  Goal status: New   5.  Pt will be able to reporting pain <= 2/10 with standing and car transfers.   Goal status: MET 10/01/22   6.  Pt will improve his left LE strength to 5/5 for improved functional mobility.  Goal status: New     PLAN:  PT FREQUENCY: 1-2x/week  PT DURATION: 8 weeks  PLANNED INTERVENTIONS: Therapeutic exercises, Therapeutic activity, Neuro Muscular re-education, Balance training, Gait training, Patient/Family education, Joint mobilization, Stair training, DME instructions, Dry Needling, Electrical stimulation, Cryotherapy, vasopneumatic device, Moist heat, Taping, Traction Ultrasound, Ionotophoresis 4mg /ml Dexamethasone, and aquatic therapy, Manual therapy.  All included unless contraindicated  PLAN FOR NEXT SESSION: Lumbar mobs, core strengthening, LE strengthening. Continue to work on extension based exercises.  Pt stating he scheduled a f/u with his PCP about Urology referral for left renal stone which may be contributing to pt's back pain.    Narda Amber, PT, MPT 10/01/22 11:42 AM   PHYSICAL THERAPY DISCHARGE SUMMARY  Visits from Start of Care: 4  Current functional level related to goals / functional outcomes: See note   Remaining deficits: See note   Education / Equipment: HEP  Patient goals were partially met. Patient is being discharged due to not returning since the last visit.  Chyrel Masson, PT, DPT, OCS, ATC 11/08/22  3:07 PM

## 2022-10-09 ENCOUNTER — Telehealth: Payer: Self-pay | Admitting: Physical Therapy

## 2022-10-09 ENCOUNTER — Encounter: Payer: Medicare Other | Admitting: Physical Therapy

## 2022-10-09 ENCOUNTER — Other Ambulatory Visit: Payer: Self-pay | Admitting: Nurse Practitioner

## 2022-10-09 DIAGNOSIS — E119 Type 2 diabetes mellitus without complications: Secondary | ICD-10-CM

## 2022-10-09 NOTE — Telephone Encounter (Signed)
I left a message with pt on his mobile number about scheduling any further PT appointments as needed. No current follow up visits scheduled.  Narda Amber, PT, MPT 10/09/22 11:16 AM

## 2022-10-18 ENCOUNTER — Other Ambulatory Visit: Payer: Self-pay | Admitting: *Deleted

## 2022-10-18 DIAGNOSIS — I6522 Occlusion and stenosis of left carotid artery: Secondary | ICD-10-CM

## 2022-10-29 ENCOUNTER — Ambulatory Visit (HOSPITAL_COMMUNITY)
Admission: RE | Admit: 2022-10-29 | Discharge: 2022-10-29 | Disposition: A | Discharge status: Home / Self Care | Payer: Medicare Other | Source: Ambulatory Visit | Attending: Surgery | Admitting: Surgery

## 2022-10-29 ENCOUNTER — Ambulatory Visit: Payer: Medicare Other | Admitting: Physician Assistant

## 2022-10-29 VITALS — BP 142/71 | HR 79 | Temp 98.0°F | Resp 14 | Ht 70.0 in | Wt 185.2 lb

## 2022-10-29 DIAGNOSIS — I6522 Occlusion and stenosis of left carotid artery: Secondary | ICD-10-CM | POA: Diagnosis not present

## 2022-10-29 NOTE — Progress Notes (Signed)
Office Note     CC:  follow up Requesting Provider:  Claiborne Rigg, NP  HPI: Jon [name] is a 67 y.o. (1955/06/05) male who presents for follow up of carotid artery stenosis. He has history of left brain stroke in 2021. This was felt to be related to intracranial disease but he did have small focal outpouching in his left ICA that was seen on CTA. For this reason he was initially referred to Dr. Myra Gianotti for evaluation. We have been following him with serial carotid duplex examinations over the past several years. He has not had any recurrent TIA or Strokes.   Today he reports no changes in his medical history since last year. He denies any visual changes, slurred speech, facial drooping, unilateral upper or lower extremity weakness or numbness. He does not have any lower extremity claudication, rest pain or tissue loss. He is medically managed on Plavix and statin.   The pt is on a statin for cholesterol management.  The pt is not on a daily aspirin.   Other AC:  Plavix The pt is on ARB for hypertension.   The pt does  have diabetes Tobacco hx:  never  Past Medical History:  Diagnosis Date   Ascending aortic aneurysm (HCC) 01/17/2022   Carotid artery occlusion    Diabetes mellitus without complication (HCC)    type II   History of kidney stones    Hyperlipidemia    Hypertension    Kidney stones    Stomach ulcer    Stroke [facility])    Dec. 14 2018    Past Surgical History:  Procedure Laterality Date   CYSTOSCOPY Left 12/10/2016   Procedure: cystoscopy with left ureteral stone extraction ;  Surgeon: Ihor Gully, MD;  Location: [facility];  Service: Urology;  Laterality: Left;   HERNIA REPAIR     Stomach ulcer      Social History   Socioeconomic History   Marital status: Divorced    Spouse name: Not on file   Number of children: 4   Years of education: 16   Highest education level: Not on file  Occupational History   Occupation: [employer] driver  Tobacco Use   Smoking status:  Never   Smokeless tobacco: Never  Vaping Use   Vaping Use: Never used  Substance and Sexual Activity   Alcohol use: Yes    Alcohol/week: 1.0 - 2.0 standard drink of alcohol    Types: 1 - 2 Glasses of wine per week    Comment: socially   Drug use: No   Sexual activity: Not Currently  Other Topics Concern   Not on file  Social History Narrative   Fun: Politics   Social Determinants of Health   Financial Resource Strain: Low Risk  (04/03/2022)   Overall Financial Resource Strain (CARDIA)    Difficulty of Paying Living Expenses: Not hard at all  Food Insecurity: No Food Insecurity (04/03/2022)   Hunger Vital Sign    Worried About Running Out of Food in the Last Year: Never true    Ran Out of Food in the Last Year: Never true  Transportation Needs: No Transportation Needs (04/03/2022)   PRAPARE - Administrator, Civil Service (Medical): No    Lack of Transportation (Non-Medical): No  Physical Activity: Insufficiently Active (04/03/2022)   Exercise Vital Sign    Days of Exercise per Week: 4 days    Minutes of Exercise per Session: 30 min  Stress: No Stress Concern Present (  04/03/2022)   [employer] [employer] - Occupational Stress Questionnaire    Feeling of Stress : Not at all  Social Connections: Not on file  Intimate Partner Violence: Not on file    Family History  Problem Relation Age of Onset   Diabetes Father    Stroke Father    Hypertension Father    Hypertension Mother    Diabetes Sister    Diabetes Son    Diabetes Daughter     Current Outpatient Medications  Medication Sig Dispense Refill   Accu-Chek Softclix Lancets lancets Check blood glucose level by fingerstick three times per day. 100 each 2   amLODipine (NORVASC) 10 MG tablet Take 1 tablet (10 mg total) by mouth daily. 100 tablet 3   atorvastatin (LIPITOR) 80 MG tablet TAKE 1 TABLET BY MOUTH DAILY 90 tablet 3   Blood Glucose Monitoring Suppl (ACCU-CHEK GUIDE) w/Device KIT  Check blood glucose level by fingerstick three times per day. 1 kit 0   clopidogrel (PLAVIX) 75 MG tablet TAKE 1 TABLET BY MOUTH DAILY 90 tablet 3   glimepiride (AMARYL) 4 MG tablet Take 2 tablets (8 mg total) by mouth daily with breakfast. 200 tablet 3   glucose blood (ACCU-CHEK GUIDE) test strip Check blood glucose level by fingerstick three times per day. 100 each 2   losartan (COZAAR) 50 MG tablet Take 1 tablet (50 mg total) by mouth daily. 100 tablet 3   metFORMIN (GLUCOPHAGE) 1000 MG tablet TAKE 1 TABLET BY MOUTH TWICE  DAILY WITH A MEAL 200 tablet 2   methylPREDNISolone (MEDROL DOSEPAK) 4 MG TBPK tablet Take per packet instructions 21 each 0   TRULICITY 0.75 MG/0.5ML SOPN Inject 0.75 mg into the skin once a week.     No current facility-administered medications for this visit.    No Known Allergies   REVIEW OF SYSTEMS:  [X]  denotes positive finding, [ ]  denotes negative finding Cardiac  Comments:  Chest pain or chest pressure:    Shortness of breath upon exertion:    Short of breath when lying flat:    Irregular heart rhythm:        Vascular    Pain in calf, thigh, or hip brought on by ambulation:    Pain in feet at night that wakes you up from your sleep:     Blood clot in your veins:    Leg swelling:         Pulmonary    Oxygen at home:    Productive cough:     Wheezing:         Neurologic    Sudden weakness in arms or legs:     Sudden numbness in arms or legs:     Sudden onset of difficulty speaking or slurred speech:    Temporary loss of vision in one eye:     Problems with dizziness:         Gastrointestinal    Blood in stool:     Vomited blood:         Genitourinary    Burning when urinating:     Blood in urine:        Psychiatric    Major depression:         Hematologic    Bleeding problems:    Problems with blood clotting too easily:        Skin    Rashes or ulcers:        Constitutional    Fever  or chills:      PHYSICAL  EXAMINATION:  Vitals:   10/29/22 1026  BP: (!) 142/71  Pulse: 79  Resp: 14  Temp: 98 F (36.7 C)  TempSrc: Temporal  SpO2: 98%  Weight: 185 lb 3.2 oz (84 kg)  Height: 5\' 10"  (1.778 m)    General:  WDWN in NAD; vital signs documented above Gait: Normal HENT: WNL, normocephalic Pulmonary: normal non-labored breathing , without wheezing Cardiac: regular HR Abdomen: soft, NT, no masses Vascular Exam/Pulses: 2+ radial, 2+ DP pulses bilaterally Extremities: without ischemic changes, without Gangrene , without cellulitis; without open wounds;  Musculoskeletal: no muscle wasting or atrophy  Neurologic: A&O X 3 Psychiatric:  The pt has Normal affect.   Non-Invasive Vascular Imaging:  VAS US Carotid Duplex: Summary:  Right Carotid: Velocities in the right ICA are consistent with a 1-39% stenosis.   Left Carotid: There is no evidence of stenosis in the left ICA.   Vertebrals: Bilateral vertebral arteries demonstrate antegrade flow.    ASSESSMENT/PLAN:: 67 y.o. male here for follow up for carotid artery stenosis. He has history of left brain stroke. He had outpouching Identified on CTA in 2021 in his left ICA. On duplex surveillance including today he has had no stenosis identified on the left ICA, right ICA 1-39%. Normal flow in the vertebral and subclavian arteries. He has had no TIA or stroke like symptoms.  - continue Statin and Plavix -discussed s/s of stroke with pt and he understands should he develop any of these sx, he will go to the nearest ER or call 911  - He will follow up in 2 years for repeat carotid duplex   Graceann Congress, PA-C [facility] [phone]  Clinic MD:   Myra Gianotti

## 2022-11-07 ENCOUNTER — Telehealth: Payer: Self-pay | Admitting: Pharmacist

## 2022-11-07 NOTE — Telephone Encounter (Signed)
Called patient to schedule an appointment for medication management. Pt was identified by a quality report as failing a [facility] adherence measure, [facility]. I was unable to reach the patient so I left a HIPAA-compliant message requesting that the patient return my call.   Of note, his antihypertensive agents are now being filled regularly as 100-day supples via [facility].  Butch Penny, PharmD, Patsy Baltimore, CPP Clinical Pharmacist [facility] [phone]

## 2022-11-08 ENCOUNTER — Other Ambulatory Visit: Payer: Self-pay

## 2022-11-12 ENCOUNTER — Other Ambulatory Visit: Payer: Self-pay

## 2022-11-14 ENCOUNTER — Ambulatory Visit: Payer: Medicare Other | Attending: Nurse Practitioner | Admitting: Nurse Practitioner

## 2022-11-14 ENCOUNTER — Other Ambulatory Visit: Payer: Self-pay

## 2022-11-14 ENCOUNTER — Encounter: Payer: Self-pay | Admitting: Nurse Practitioner

## 2022-11-14 VITALS — BP 131/74 | HR 88 | Ht 70.0 in | Wt 186.6 lb

## 2022-11-14 DIAGNOSIS — I10 Essential (primary) hypertension: Secondary | ICD-10-CM | POA: Diagnosis not present

## 2022-11-14 DIAGNOSIS — Z23 Encounter for immunization: Secondary | ICD-10-CM

## 2022-11-14 DIAGNOSIS — E1165 Type 2 diabetes mellitus with hyperglycemia: Secondary | ICD-10-CM

## 2022-11-14 DIAGNOSIS — Z794 Long term (current) use of insulin: Secondary | ICD-10-CM

## 2022-11-14 MED ORDER — TETANUS-DIPHTH-ACELL PERTUSSIS 5-2-15.5 LF-MCG/0.5 IM SUSP
0.5000 mL | Freq: Once | INTRAMUSCULAR | 0 refills | Status: AC
Start: 2022-11-14 — End: 2022-11-15
  Filled 2022-11-14 (×2): qty 0.5, 1d supply, fill #0

## 2022-11-14 MED ORDER — ZOSTER VAC RECOMB ADJUVANTED 50 MCG/0.5ML IM SUSR
0.5000 mL | Freq: Once | INTRAMUSCULAR | 0 refills | Status: AC
  Filled 2022-11-14 (×2): qty 0.5, 1d supply, fill #0

## 2022-11-14 NOTE — Progress Notes (Signed)
Assessment & Plan:  Jon [name] was seen today for hypertension.  Diagnoses and all orders for this visit:  Primary hypertension  Type 2 diabetes mellitus with hyperglycemia, with long-term current use of insulin (HCC) -     Hemoglobin A1c  Need for shingles vaccine -     Zoster Vaccine Adjuvanted [facility]) injection; Inject 0.5 mLs into the muscle once for 1 dose.  Need for Tdap vaccination -     Tdap (ADACEL) 09-12-13.5 LF-MCG/0.5 injection; Inject 0.5 mLs into the muscle once for 1 dose.    Patient has been counseled on age-appropriate routine health concerns for screening and prevention. These are reviewed and up-to-date. Referrals have been placed accordingly. Immunizations are up-to-date or declined.    Subjective:   Chief Complaint  Patient presents with   Hypertension   Hypertension Pertinent negatives include no blurred vision, chest pain, headaches, malaise/fatigue, palpitations or shortness of breath.   Jon [name] 67 y.o. male presents to office today for follow up to HTN  He is doing well today with no concerns.   He has a past medical history of Carotid artery occlusion, DM 2, History of kidney stones, Hyperlipidemia, Hypertension, Kidney stones, Stomach ulcer, and Stroke (taking plavix daily).   Blood pressure is well-controlled. He is taking amlodipine 10 mg daily and losartan 50 mg daily as prescribed.  BP Readings from Last 3 Encounters:  11/14/22 131/74  10/29/22 (!) 142/71  08/13/22 132/70    DM 2 Diabetes is well-controlled with Trulicity 0.75 mg weekly, glimepiride 8 mg daily and metformin Lab Results  Component Value Date   HGBA1C 6.9 (H) 08/13/2022    Review of Systems  Constitutional:  Negative for fever, malaise/fatigue and weight loss.  HENT: Negative.  Negative for nosebleeds.   Eyes: Negative.  Negative for blurred vision, double vision and photophobia.  Respiratory: Negative.  Negative for cough and shortness of breath.    Cardiovascular: Negative.  Negative for chest pain, palpitations and leg swelling.  Gastrointestinal: Negative.  Negative for heartburn, nausea and vomiting.  Musculoskeletal: Negative.  Negative for myalgias.  Neurological: Negative.  Negative for dizziness, focal weakness, seizures and headaches.  Psychiatric/Behavioral: Negative.  Negative for suicidal ideas.     Past Medical History:  Diagnosis Date   Ascending aortic aneurysm (HCC) 01/17/2022   Carotid artery occlusion    Diabetes mellitus without complication (HCC)    type II   History of kidney stones    Hyperlipidemia    Hypertension    Kidney stones    Stomach ulcer    Stroke [facility])    Dec. 14 2018    Past Surgical History:  Procedure Laterality Date   CYSTOSCOPY Left 12/10/2016   Procedure: cystoscopy with left ureteral stone extraction ;  Surgeon: Ihor Gully, MD;  Location: [facility];  Service: Urology;  Laterality: Left;   HERNIA REPAIR     Stomach ulcer      Family History  Problem Relation Age of Onset   Diabetes Father    Stroke Father    Hypertension Father    Hypertension Mother    Diabetes Sister    Diabetes Son    Diabetes Daughter     Social History Reviewed with no changes to be made today.   Outpatient Medications Prior to Visit  Medication Sig Dispense Refill   Accu-Chek Softclix Lancets lancets Check blood glucose level by fingerstick three times per day. 100 each 2   amLODipine (NORVASC) 10 MG tablet Take 1 tablet (10  mg total) by mouth daily. 100 tablet 3   atorvastatin (LIPITOR) 80 MG tablet TAKE 1 TABLET BY MOUTH DAILY 90 tablet 3   Blood Glucose Monitoring Suppl (ACCU-CHEK GUIDE) w/Device KIT Check blood glucose level by fingerstick three times per day. 1 kit 0   clopidogrel (PLAVIX) 75 MG tablet TAKE 1 TABLET BY MOUTH DAILY 90 tablet 3   glimepiride (AMARYL) 4 MG tablet Take 2 tablets (8 mg total) by mouth daily with breakfast. 200 tablet 3   glucose blood (ACCU-CHEK GUIDE) test strip  Check blood glucose level by fingerstick three times per day. 100 each 2   losartan (COZAAR) 50 MG tablet Take 1 tablet (50 mg total) by mouth daily. 100 tablet 3   metFORMIN (GLUCOPHAGE) 1000 MG tablet TAKE 1 TABLET BY MOUTH TWICE  DAILY WITH A MEAL 200 tablet 2   TRULICITY 0.75 MG/0.5ML SOPN Inject 0.75 mg into the skin once a week.     methylPREDNISolone (MEDROL DOSEPAK) 4 MG TBPK tablet Take per packet instructions (Patient not taking: Reported on 11/14/2022) 21 each 0   No facility-administered medications prior to visit.    No Known Allergies     Objective:    BP 131/74 (BP Location: Left Arm, Patient Position: Sitting, Cuff Size: Normal)   Pulse 88   Ht 5\' 10"  (1.778 m)   Wt 186 lb 9.6 oz (84.6 kg)   SpO2 97%   BMI 26.77 kg/m  Wt Readings from Last 3 Encounters:  11/14/22 186 lb 9.6 oz (84.6 kg)  10/29/22 185 lb 3.2 oz (84 kg)  08/13/22 182 lb 6.4 oz (82.7 kg)    Physical Exam Vitals and nursing note reviewed.  Constitutional:      Appearance: He is well-developed.  HENT:     Head: Normocephalic and atraumatic.  Cardiovascular:     Rate and Rhythm: Normal rate and regular rhythm.     Heart sounds: Normal heart sounds. No murmur heard.    No friction rub. No gallop.  Pulmonary:     Effort: Pulmonary effort is normal. No tachypnea or respiratory distress.     Breath sounds: Normal breath sounds. No decreased breath sounds, wheezing, rhonchi or rales.  Chest:     Chest wall: No tenderness.  Abdominal:     General: Bowel sounds are normal.     Palpations: Abdomen is soft.  Musculoskeletal:        General: Normal range of motion.     Cervical back: Normal range of motion.  Skin:    General: Skin is warm and dry.  Neurological:     Mental Status: He is alert and oriented to person, place, and time.     Coordination: Coordination normal.  Psychiatric:        Behavior: Behavior normal. Behavior is cooperative.        Thought Content: Thought content normal.         Judgment: Judgment normal.          Patient has been counseled extensively about nutrition and exercise as well as the importance of adherence with medications and regular follow-up. The patient was given clear instructions to go to ER or return to medical center if symptoms don't improve, worsen or new problems develop. The patient verbalized understanding.   Follow-up: No follow-ups on file.   Claiborne Rigg, FNP-BC [facility] and [facility] [place], [place] [phone]   11/14/2022, 11:17 AM

## 2022-11-15 LAB — CMP14+EGFR
ALT: 17 IU/L (ref 0–44)
AST: 16 IU/L (ref 0–40)
Albumin: 4.6 g/dL (ref 3.9–4.9)
Alkaline Phosphatase: 80 IU/L (ref 44–121)
BUN/Creatinine Ratio: 13 (ref 10–24)
BUN: 13 mg/dL (ref 8–27)
Bilirubin Total: 0.6 mg/dL (ref 0.0–1.2)
CO2: 20 mmol/L (ref 20–29)
Calcium: 9.8 mg/dL (ref 8.6–10.2)
Chloride: 102 mmol/L (ref 96–106)
Creatinine, Ser: 1.01 mg/dL (ref 0.76–1.27)
Globulin, Total: 3.3 g/dL (ref 1.5–4.5)
Glucose: 133 mg/dL — ABNORMAL HIGH (ref 70–99)
Potassium: 5 mmol/L (ref 3.5–5.2)
Sodium: 139 mmol/L (ref 134–144)
Total Protein: 7.9 g/dL (ref 6.0–8.5)
eGFR: 81 mL/min/{1.73_m2} (ref >59)

## 2022-11-15 LAB — HEMOGLOBIN A1C
Est. average glucose Bld gHb Est-mCnc: 171 mg/dL
Hgb A1c MFr Bld: 7.6 % — ABNORMAL HIGH (ref 4.8–5.6)

## 2022-11-27 ENCOUNTER — Other Ambulatory Visit (HOSPITAL_COMMUNITY): Payer: Self-pay | Admitting: Family Medicine

## 2022-11-27 ENCOUNTER — Ambulatory Visit (HOSPITAL_COMMUNITY)
Admission: RE | Admit: 2022-11-27 | Discharge: 2022-11-27 | Disposition: A | Discharge status: Home / Self Care | Payer: Medicare Other | Source: Ambulatory Visit | Attending: Family Medicine | Admitting: Family Medicine

## 2022-11-27 DIAGNOSIS — R7612 Nonspecific reaction to cell mediated immunity measurement of gamma interferon antigen response without active tuberculosis: Secondary | ICD-10-CM

## 2022-12-14 ENCOUNTER — Other Ambulatory Visit: Payer: Self-pay | Admitting: Pharmacist

## 2022-12-14 ENCOUNTER — Other Ambulatory Visit: Payer: Self-pay

## 2022-12-14 MED ORDER — LOSARTAN POTASSIUM 50 MG PO TABS
50.0000 mg | ORAL_TABLET | Freq: Every day | ORAL | 0 refills | Status: DC
  Filled 2022-12-14: qty 30, 30d supply, fill #0

## 2022-12-19 ENCOUNTER — Ambulatory Visit: Payer: Medicare Other | Attending: Nurse Practitioner

## 2022-12-19 VITALS — Ht 70.0 in | Wt 186.0 lb

## 2022-12-19 DIAGNOSIS — Z0000 Encounter for general adult medical examination without abnormal findings: Secondary | ICD-10-CM | POA: Diagnosis not present

## 2022-12-19 NOTE — Patient Instructions (Addendum)
Jon [name] , Thank you for taking time to come for your Medicare Wellness Visit. I appreciate your ongoing commitment to your health goals. Please review the following plan we discussed and let me know if I can assist you in the future.   Referrals/Orders/Follow-Ups/Clinician Recommendations: Aim for 30 minutes of exercise or brisk walking, 6-8 glasses of water, and 5 servings of fruits and vegetables each day.  This is a list of the screening recommended for you and due dates:  Health Maintenance  Topic Date Due   COVID-19 Vaccine (4 - 2023-24 season) 01/12/2022   Eye exam for diabetics  03/01/2022   Yearly kidney health urinalysis for diabetes  05/09/2023   Hemoglobin A1C  05/17/2023   Yearly kidney function blood test for diabetes  11/14/2023   Complete foot exam   11/14/2023   Medicare Annual Wellness Visit  12/19/2023   Colon Cancer Screening  01/06/2024   DTaP/Tdap/Td vaccine (2 - Td or Tdap) 11/13/2032   Pneumonia Vaccine  Completed   Hepatitis C Screening  Completed   Zoster (Shingles) Vaccine  Completed   HPV Vaccine  Aged Out   Flu Shot  Discontinued    Advanced directives: (ACP Link)Information on Advanced Care Planning can be found at [facility] Advance Health Care Directives Advance Health Care Directives ([email or web address]   Next Medicare Annual Wellness Visit scheduled for next year: Yes  Preventive Care 65 Years and Older, Male  Preventive care refers to lifestyle choices and visits with your health care provider that can promote health and wellness. What does preventive care include? A yearly physical exam. This is also called an annual well check. Dental exams once or twice a year. Routine eye exams. Ask your health care provider how often you should have your eyes checked. Personal lifestyle choices, including: Daily care of your teeth and gums. Regular physical activity. Eating a healthy diet. Avoiding tobacco and drug use. Limiting alcohol  use. Practicing safe sex. Taking low doses of aspirin every day. Taking vitamin and mineral supplements as recommended by your health care provider. What happens during an annual well check? The services and screenings done by your health care provider during your annual well check will depend on your age, overall health, lifestyle risk factors, and family history of disease. Counseling  Your health care provider may ask you questions about your: Alcohol use. Tobacco use. Drug use. Emotional well-being. Home and relationship well-being. Sexual activity. Eating habits. History of falls. Memory and ability to understand (cognition). Work and work Astronomer. Screening  You may have the following tests or measurements: Height, weight, and BMI. Blood pressure. Lipid and cholesterol levels. These may be checked every 5 years, or more frequently if you are over 49 years old. Skin check. Lung cancer screening. You may have this screening every year starting at age 70 if you have a 30-pack-year history of smoking and currently smoke or have quit within the past 15 years. Fecal occult blood test (FOBT) of the stool. You may have this test every year starting at age 11. Flexible sigmoidoscopy or colonoscopy. You may have a sigmoidoscopy every 5 years or a colonoscopy every 10 years starting at age 15. Prostate cancer screening. Recommendations will vary depending on your family history and other risks. Hepatitis C blood test. Hepatitis B blood test. Sexually transmitted disease (STD) testing. Diabetes screening. This is done by checking your blood sugar (glucose) after you have not eaten for a while (fasting). You may have this  done every 1-3 years. Abdominal aortic aneurysm (AAA) screening. You may need this if you are a current or former smoker. Osteoporosis. You may be screened starting at age 30 if you are at high risk. Talk with your health care provider about your test results,  treatment options, and if necessary, the need for more tests. Vaccines  Your health care provider may recommend certain vaccines, such as: Influenza vaccine. This is recommended every year. Tetanus, diphtheria, and acellular pertussis (Tdap, Td) vaccine. You may need a Td booster every 10 years. Zoster vaccine. You may need this after age 25. Pneumococcal 13-valent conjugate (PCV13) vaccine. One dose is recommended after age 81. Pneumococcal polysaccharide (PPSV23) vaccine. One dose is recommended after age 44. Talk to your health care provider about which screenings and vaccines you need and how often you need them. This information is not intended to replace advice given to you by your health care provider. Make sure you discuss any questions you have with your health care provider. Document Released: 05/27/2015 Document Revised: 01/18/2016 Document Reviewed: 03/01/2015 Elsevier Interactive Patient Education  2017 [employer].  Fall Prevention in the Home Falls can cause injuries. They can happen to people of all ages. There are many things you can do to make your home safe and to help prevent falls. What can I do on the outside of my home? Regularly fix the edges of walkways and driveways and fix any cracks. Remove anything that might make you trip as you walk through a door, such as a raised step or threshold. Trim any bushes or trees on the path to your home. Use bright outdoor lighting. Clear any walking paths of anything that might make someone trip, such as rocks or tools. Regularly check to see if handrails are loose or broken. Make sure that both sides of any steps have handrails. Any raised decks and porches should have guardrails on the edges. Have any leaves, snow, or ice cleared regularly. Use sand or salt on walking paths during winter. Clean up any spills in your garage right away. This includes oil or grease spills. What can I do in the bathroom? Use night  lights. Install grab bars by the toilet and in the tub and shower. Do not use towel bars as grab bars. Use non-skid mats or decals in the tub or shower. If you need to sit down in the shower, use a plastic, non-slip stool. Keep the floor dry. Clean up any water that spills on the floor as soon as it happens. Remove soap buildup in the tub or shower regularly. Attach bath mats securely with double-sided non-slip rug tape. Do not have throw rugs and other things on the floor that can make you trip. What can I do in the bedroom? Use night lights. Make sure that you have a light by your bed that is easy to reach. Do not use any sheets or blankets that are too big for your bed. They should not hang down onto the floor. Have a firm chair that has side arms. You can use this for support while you get dressed. Do not have throw rugs and other things on the floor that can make you trip. What can I do in the kitchen? Clean up any spills right away. Avoid walking on wet floors. Keep items that you use a lot in easy-to-reach places. If you need to reach something above you, use a strong step stool that has a grab bar. Keep electrical cords out of  the way. Do not use floor polish or wax that makes floors slippery. If you must use wax, use non-skid floor wax. Do not have throw rugs and other things on the floor that can make you trip. What can I do with my stairs? Do not leave any items on the stairs. Make sure that there are handrails on both sides of the stairs and use them. Fix handrails that are broken or loose. Make sure that handrails are as long as the stairways. Check any carpeting to make sure that it is firmly attached to the stairs. Fix any carpet that is loose or worn. Avoid having throw rugs at the top or bottom of the stairs. If you do have throw rugs, attach them to the floor with carpet tape. Make sure that you have a light switch at the top of the stairs and the bottom of the stairs. If  you do not have them, ask someone to add them for you. What else can I do to help prevent falls? Wear shoes that: Do not have high heels. Have rubber bottoms. Are comfortable and fit you well. Are closed at the toe. Do not wear sandals. If you use a stepladder: Make sure that it is fully opened. Do not climb a closed stepladder. Make sure that both sides of the stepladder are locked into place. Ask someone to hold it for you, if possible. Clearly mark and make sure that you can see: Any grab bars or handrails. First and last steps. Where the edge of each step is. Use tools that help you move around (mobility aids) if they are needed. These include: Canes. Walkers. Scooters. Crutches. Turn on the lights when you go into a dark area. Replace any light bulbs as soon as they burn out. Set up your furniture so you have a clear path. Avoid moving your furniture around. If any of your floors are uneven, fix them. If there are any pets around you, be aware of where they are. Review your medicines with your doctor. Some medicines can make you feel dizzy. This can increase your chance of falling. Ask your doctor what other things that you can do to help prevent falls. This information is not intended to replace advice given to you by your health care provider. Make sure you discuss any questions you have with your health care provider. Document Released: 02/24/2009 Document Revised: 10/06/2015 Document Reviewed: 06/04/2014 Elsevier Interactive Patient Education  2017 [employer].

## 2022-12-19 NOTE — Progress Notes (Signed)
Subjective:   Jon [name] is a 67 y.o. male who presents for Medicare Annual/Subsequent preventive examination.  Visit Complete: Virtual  I connected with  Jon [name] on 12/19/22 by a audio enabled telemedicine application and verified that I am speaking with the correct person using two identifiers.  Patient Location: Home  Provider Location: Home Office  I discussed the limitations of evaluation and management by telemedicine. The patient expressed understanding and agreed to proceed.  Vital Signs: Per patient no change in vitals since last visit.  Review of Systems     Cardiac Risk Factors include: advanced age (>70men, >5 women);diabetes mellitus;dyslipidemia;male gender;hypertension     Objective:    Today's Vitals   12/19/22 1137  Weight: 186 lb (84.4 kg)  Height: 5\' 10"  (1.778 m)   Body mass index is 26.69 kg/m.     12/19/2022   11:40 AM 09/10/2022   11:10 AM 04/03/2022    9:01 AM 11/08/2020    3:37 AM 11/07/2020   10:27 PM 03/14/2020   10:03 AM 09/17/2019   10:11 PM  Advanced Directives  Does Patient Have a Medical Advance Directive? No No No No No No No  Would patient like information on creating a medical advance directive? Yes (MAU/Ambulatory/Procedural Areas - Information given) No - Patient declined  Yes (Inpatient - patient requests chaplain consult to create a medical advance directive) No - Patient declined No - Patient declined No - Patient declined    Current Medications (verified) Outpatient Encounter Medications as of 12/19/2022  Medication Sig   Accu-Chek Softclix Lancets lancets Check blood glucose level by fingerstick three times per day.   amLODipine (NORVASC) 10 MG tablet Take 1 tablet (10 mg total) by mouth daily.   atorvastatin (LIPITOR) 80 MG tablet TAKE 1 TABLET BY MOUTH DAILY   Blood Glucose Monitoring Suppl (ACCU-CHEK GUIDE) w/Device KIT Check blood glucose level by fingerstick three times per day.   clopidogrel (PLAVIX) 75 MG tablet  TAKE 1 TABLET BY MOUTH DAILY   glimepiride (AMARYL) 4 MG tablet Take 2 tablets (8 mg total) by mouth daily with breakfast.   glucose blood (ACCU-CHEK GUIDE) test strip Check blood glucose level by fingerstick three times per day.   losartan (COZAAR) 50 MG tablet Take 1 tablet (50 mg total) by mouth daily.   losartan (COZAAR) 50 MG tablet Take 1 tablet (50 mg total) by mouth daily.   metFORMIN (GLUCOPHAGE) 1000 MG tablet TAKE 1 TABLET BY MOUTH TWICE  DAILY WITH A MEAL   TRULICITY 0.75 MG/0.5ML SOPN Inject 0.75 mg into the skin once a week.   No facility-administered encounter medications on file as of 12/19/2022.    Allergies (verified) Patient has no known allergies.   History: Past Medical History:  Diagnosis Date   Ascending aortic aneurysm (HCC) 01/17/2022   Carotid artery occlusion    Diabetes mellitus without complication (HCC)    type II   History of kidney stones    Hyperlipidemia    Hypertension    Kidney stones    Stomach ulcer    Stroke [facility])    Dec. 14 2018   Past Surgical History:  Procedure Laterality Date   CYSTOSCOPY Left 12/10/2016   Procedure: cystoscopy with left ureteral stone extraction ;  Surgeon: Ihor Gully, MD;  Location: [facility];  Service: Urology;  Laterality: Left;   HERNIA REPAIR     Stomach ulcer     Family History  Problem Relation Age of Onset   Diabetes Father  Stroke Father    Hypertension Father    Hypertension Mother    Diabetes Sister    Diabetes Son    Diabetes Daughter    Social History   Socioeconomic History   Marital status: Divorced    Spouse name: Not on file   Number of children: 4   Years of education: 16   Highest education level: Not on file  Occupational History   Occupation: [employer] driver  Tobacco Use   Smoking status: Never   Smokeless tobacco: Never  Vaping Use   Vaping status: Never Used  Substance and Sexual Activity   Alcohol use: Yes    Alcohol/week: 1.0 - 2.0 standard drink of alcohol    Types: 1 - 2  Glasses of wine per week    Comment: socially   Drug use: No   Sexual activity: Not Currently  Other Topics Concern   Not on file  Social History Narrative   Fun: Politics   Social Determinants of Health   Financial Resource Strain: Low Risk  (04/03/2022)   Overall Financial Resource Strain (CARDIA)    Difficulty of Paying Living Expenses: Not hard at all  Food Insecurity: No Food Insecurity (12/19/2022)   Hunger Vital Sign    Worried About Running Out of Food in the Last Year: Never true    Ran Out of Food in the Last Year: Never true  Transportation Needs: No Transportation Needs (12/19/2022)   PRAPARE - Administrator, Civil Service (Medical): No    Lack of Transportation (Non-Medical): No  Physical Activity: Insufficiently Active (12/19/2022)   Exercise Vital Sign    Days of Exercise per Week: 3 days    Minutes of Exercise per Session: 30 min  Stress: No Stress Concern Present (12/19/2022)   [employer] of Occupational Health - Occupational Stress Questionnaire    Feeling of Stress : Not at all  Social Connections: Moderately Isolated (12/19/2022)   Social Connection and Isolation Panel [NHANES]    Frequency of Communication with Friends and Family: More than three times a week    Frequency of Social Gatherings with Friends and Family: Three times a week    Attends Religious Services: 1 to 4 times per year    Active Member of Clubs or Organizations: No    Attends Banker Meetings: Never    Marital Status: Divorced    Tobacco Counseling Counseling given: Not Answered   Clinical Intake:  Pre-visit preparation completed: Yes  Pain : No/denies pain     Diabetes: Yes CBG done?: No Did pt. bring in CBG monitor from home?: No  How often do you need to have someone help you when you read instructions, pamphlets, or other written materials from your doctor or pharmacy?: 1 - Never  Interpreter Needed?: No  Information entered by :: Kandis Fantasia LPN   Activities of Daily Living    12/19/2022   11:38 AM 04/03/2022    9:02 AM  In your present state of health, do you have any difficulty performing the following activities:  Hearing? 0 0  Vision? 0 0  Difficulty concentrating or making decisions? 0 0  Walking or climbing stairs? 0 0  Dressing or bathing? 0 0  Doing errands, shopping? 0 0  Preparing Food and eating ? N N  Using the Toilet? N N  In the past six months, have you accidently leaked urine? N N  Do you have problems with loss of bowel control? N  N  Managing your Medications? N N  Managing your Finances? N N  Housekeeping or managing your Housekeeping? N N    Patient Care Team: Claiborne Rigg, NP as PCP - General (Nurse Practitioner) Chilton Si, MD as PCP - Cardiology (Cardiology)  Indicate any recent Medical Services you may have received from other than [facility] providers in the past year (date may be approximate).     Assessment:   This is a routine wellness examination for Laurent.  Hearing/Vision screen Hearing Screening - Comments:: Denies hearing difficulties   Vision Screening - Comments:: Wears rx glasses - up to date with routine eye exams with Dr. Randon Goldsmith    Dietary issues and exercise activities discussed:     Goals Addressed             This Visit's Progress    COMPLETED: Patient Stated       04/03/2022, wants health to get better     Remain active and independent         Depression Screen    12/19/2022   11:39 AM 11/14/2022   11:06 AM 05/08/2022    9:34 AM 04/03/2022    9:02 AM 10/30/2021    3:15 PM 06/19/2021    3:15 PM 03/20/2021    4:16 PM  PHQ 2/9 Scores  PHQ - 2 Score 0 0 0 0 0 0 0  PHQ- 9 Score 0 0 0  0 0 0    Fall Risk    12/19/2022   11:40 AM 11/14/2022   11:02 AM 08/13/2022   10:04 AM 05/08/2022    9:22 AM 04/03/2022    9:02 AM  Fall Risk   Falls in the past year? 0 0 0 0 0  Number falls in past yr: 0 0 0 0 0  Injury with Fall? 0 0 0 0 0  Risk for fall  due to : No Fall Risks No Fall Risks No Fall Risks  Medication side effect  Follow up Falls prevention discussed;Education provided;Falls evaluation completed Falls evaluation completed Falls evaluation completed Falls evaluation completed Falls prevention discussed;Education provided;Falls evaluation completed    MEDICARE RISK AT HOME:  Medicare Risk at Home - 12/19/22 1140     Any stairs in or around the home? No    If so, are there any without handrails? No    Home free of loose throw rugs in walkways, pet beds, electrical cords, etc? Yes    Adequate lighting in your home to reduce risk of falls? Yes    Life alert? No    Use of a cane, [place] or w/c? No    Grab bars in the bathroom? Yes    Shower chair or bench in shower? No    Elevated toilet seat or a handicapped toilet? No             TIMED UP AND GO:  Was the test performed?  No    Cognitive Function:        12/19/2022   11:40 AM 04/03/2022    9:03 AM  6CIT Screen  What Year? 0 points 0 points  What month? 0 points 0 points  What time? 0 points 0 points  Count back from 20 0 points 0 points  Months in reverse 0 points 0 points  Repeat phrase 0 points 0 points  Total Score 0 points 0 points    Immunizations Immunization History  Administered Date(s) Administered   PFIZER(Purple Top)SARS-COV-2 Vaccination 07/26/2019, 08/15/2019, 02/22/2020  PNEUMOCOCCAL CONJUGATE-20 08/13/2022   Pneumococcal Conjugate-13 08/24/2019   Tdap 11/14/2022   Zoster Recombinant(Shingrix) 08/13/2022, 11/14/2022    TDAP status: Up to date  Flu Vaccine status: Declined, Education has been provided regarding the importance of this vaccine but patient still declined. Advised may receive this vaccine at local pharmacy or Health Dept. Aware to provide a copy of the vaccination record if obtained from local pharmacy or Health Dept. Verbalized acceptance and understanding.  Pneumococcal vaccine status: Up to date  Covid-19 vaccine  status: Information provided on how to obtain vaccines.   Qualifies for Shingles Vaccine? Yes   Zostavax completed No   Shingrix Completed?: Yes  Screening Tests Health Maintenance  Topic Date Due   COVID-19 Vaccine (4 - 2023-24 season) 01/12/2022   OPHTHALMOLOGY EXAM  03/01/2022   Diabetic kidney evaluation - Urine ACR  05/09/2023   HEMOGLOBIN A1C  05/17/2023   Diabetic kidney evaluation - eGFR measurement  11/14/2023   FOOT EXAM  11/14/2023   Medicare Annual Wellness (AWV)  12/19/2023   Colonoscopy  01/06/2024   DTaP/Tdap/Td (2 - Td or Tdap) 11/13/2032   Pneumonia Vaccine 58+ Years old  Completed   Hepatitis C Screening  Completed   Zoster Vaccines- Shingrix  Completed   HPV VACCINES  Aged Out   INFLUENZA VACCINE  Discontinued    Health Maintenance  Health Maintenance Due  Topic Date Due   COVID-19 Vaccine (4 - 2023-24 season) 01/12/2022   OPHTHALMOLOGY EXAM  03/01/2022    Colorectal cancer screening: Type of screening: Colonoscopy. Completed 01/05/21. Repeat every 3 years  Lung Cancer Screening: (Low Dose CT Chest recommended if Age 13-80 years, 20 pack-year currently smoking OR have quit w/in 15years.) does not qualify.   Lung Cancer Screening Referral: n/a  Additional Screening:  Hepatitis C Screening: does qualify; Completed 06/13/20  Vision Screening: Recommended annual ophthalmology exams for early detection of glaucoma and other disorders of the eye. Is the patient up to date with their annual eye exam?  Yes  Who is the provider or what is the name of the office in which the patient attends annual eye exams? [facility]  If pt is not established with a provider, would they like to be referred to a provider to establish care? No .   Dental Screening: Recommended annual dental exams for proper oral hygiene  Diabetic Foot Exam: Diabetic Foot Exam: Completed 11/14/22  Community Resource Referral / Chronic Care Management: CRR required this visit?  No    CCM required this visit?  No     Plan:     I have personally reviewed and noted the following in the patient's chart:   Medical and social history Use of alcohol, tobacco or illicit drugs  Current medications and supplements including opioid prescriptions. Patient is not currently taking opioid prescriptions. Functional ability and status Nutritional status Physical activity Advanced directives List of other physicians Hospitalizations, surgeries, and ER visits in previous 12 months Vitals Screenings to include cognitive, depression, and falls Referrals and appointments  In addition, I have reviewed and discussed with patient certain preventive protocols, quality metrics, and best practice recommendations. A written personalized care plan for preventive services as well as general preventive health recommendations were provided to patient.     Kandis Fantasia [place], [place]   05/14/9145   After Visit Summary: (MyChart) Due to this being a telephonic visit, the after visit summary with patients personalized plan was offered to patient via MyChart   Nurse Notes:  Patient  is complaining of intermittent lower back pain and wonders if this may be related to possible kidney stones.

## 2022-12-31 DIAGNOSIS — Z961 Presence of intraocular lens: Secondary | ICD-10-CM | POA: Diagnosis not present

## 2022-12-31 DIAGNOSIS — H26492 Other secondary cataract, left eye: Secondary | ICD-10-CM | POA: Diagnosis not present

## 2022-12-31 DIAGNOSIS — E113291 Type 2 diabetes mellitus with mild nonproliferative diabetic retinopathy without macular edema, right eye: Secondary | ICD-10-CM | POA: Diagnosis not present

## 2022-12-31 DIAGNOSIS — H524 Presbyopia: Secondary | ICD-10-CM | POA: Diagnosis not present

## 2022-12-31 LAB — HM DIABETES EYE EXAM

## 2023-01-17 DIAGNOSIS — H26492 Other secondary cataract, left eye: Secondary | ICD-10-CM | POA: Diagnosis not present

## 2023-01-29 ENCOUNTER — Other Ambulatory Visit: Payer: Self-pay | Admitting: Nurse Practitioner

## 2023-01-29 ENCOUNTER — Other Ambulatory Visit: Payer: Self-pay

## 2023-01-29 DIAGNOSIS — E119 Type 2 diabetes mellitus without complications: Secondary | ICD-10-CM

## 2023-01-30 NOTE — Telephone Encounter (Signed)
Requested by interface surescripts. One touch verio test strips discontinued last on 04/03/22. Changed to accu check test strips.  Requested Prescriptions  Refused Prescriptions Disp Refills   glucose blood (ONETOUCH VERIO) test strip 50 strip 1    Sig: USE AS INSTRUCTED TO CHECK BLOOD SUGAR DAILY.     Endocrinology: Diabetes - Testing Supplies Passed - 01/29/2023 12:59 PM      Passed - Valid encounter within last 12 months    Recent Outpatient Visits           2 months ago Primary hypertension   [place] [facility] [place], [place] W, NP   5 months ago Type 2 diabetes mellitus without complication, without long-term current use of insulin [facility])   [place] [facility] [place], [place] W, NP   8 months ago Type 2 diabetes mellitus without complication, without long-term current use of insulin [facility])   [place] [facility] [place], [place] W, NP   1 year ago Type 2 diabetes mellitus without complication, without long-term current use of insulin [facility])   [place] [facility] [place], [place] W, NP   1 year ago Type 2 diabetes mellitus without complication, without long-term current use of insulin [facility])    [facility] [place], [place] Stakes, NP       Future Appointments             In 2 weeks Claiborne Rigg, NP [facility]

## 2023-01-31 ENCOUNTER — Other Ambulatory Visit: Payer: Self-pay | Admitting: Nurse Practitioner

## 2023-01-31 ENCOUNTER — Other Ambulatory Visit: Payer: Self-pay

## 2023-02-01 ENCOUNTER — Other Ambulatory Visit: Payer: Self-pay

## 2023-02-01 MED ORDER — ACCU-CHEK GUIDE VI STRP
ORAL_STRIP | 2 refills | Status: DC
  Filled 2023-02-01: qty 100, 33d supply, fill #0

## 2023-02-04 ENCOUNTER — Other Ambulatory Visit: Payer: Self-pay

## 2023-02-18 ENCOUNTER — Ambulatory Visit: Payer: Medicare Other | Attending: Nurse Practitioner | Admitting: Nurse Practitioner

## 2023-02-18 ENCOUNTER — Other Ambulatory Visit: Payer: Self-pay

## 2023-02-18 ENCOUNTER — Encounter: Payer: Self-pay | Admitting: Nurse Practitioner

## 2023-02-18 VITALS — BP 146/78 | HR 79 | Wt 189.2 lb

## 2023-02-18 DIAGNOSIS — E1159 Type 2 diabetes mellitus with other circulatory complications: Secondary | ICD-10-CM | POA: Diagnosis not present

## 2023-02-18 DIAGNOSIS — Z7984 Long term (current) use of oral hypoglycemic drugs: Secondary | ICD-10-CM | POA: Diagnosis not present

## 2023-02-18 DIAGNOSIS — I10 Essential (primary) hypertension: Secondary | ICD-10-CM | POA: Diagnosis not present

## 2023-02-18 DIAGNOSIS — R351 Nocturia: Secondary | ICD-10-CM

## 2023-02-18 LAB — POCT GLYCOSYLATED HEMOGLOBIN (HGB A1C): HbA1c, POC (controlled diabetic range): 7 % (ref 0.0–7.0)

## 2023-02-18 LAB — GLUCOSE, POCT (MANUAL RESULT ENTRY): POC Glucose: 168 mg/dL — AB (ref 70–99)

## 2023-02-18 MED ORDER — BLOOD GLUCOSE TEST VI STRP
ORAL_STRIP | 6 refills | Status: DC
  Filled 2023-02-18: qty 200, fill #0
  Filled 2023-05-09: qty 100, 33d supply, fill #0
  Filled 2023-06-17: qty 100, 33d supply, fill #1
  Filled 2023-10-23: qty 100, 33d supply, fill #2
  Filled 2023-12-31: qty 100, 33d supply, fill #3

## 2023-02-18 NOTE — Progress Notes (Signed)
Assessment & Plan:  Jon [name] was seen today for medical management of chronic issues.  Diagnoses and all orders for this visit:  Type 2 diabetes mellitus with vascular disease (HCC) -     POCT glucose (manual entry) -     POCT glycosylated hemoglobin (Hb A1C) -     glucose blood (ACCU-CHEK GUIDE) test strip; Check blood glucose level by fingerstick three times per day. Continue blood sugar control as discussed in office today, low carbohydrate diet, and regular physical exercise as tolerated, 150 minutes per week (30 min each day, 5 days per week, or 50 min 3 days per week). Keep blood sugar logs with fasting goal of 90-130 mg/dl, post prandial (after you eat) less than 180.  For Hypoglycemia: BS <60 and Hyperglycemia BS >400; contact the clinic ASAP. Annual eye exams and foot exams are recommended.   Nocturia more than twice per night -     PSA -     Urinalysis, Complete  Primary hypertension Continue all antihypertensives as prescribed.  Reminded to bring in blood pressure log for follow  up appointment.  RECOMMENDATIONS: DASH/Mediterranean Diets are healthier choices for HTN.     Patient has been counseled on age-appropriate routine health concerns for screening and prevention. These are reviewed and up-to-date. Referrals have been placed accordingly. Immunizations are up-to-date or declined.    Subjective:   Chief Complaint  Patient presents with   Medical Management of Chronic Issues   [name] 67 y.o. male presents to office today for follow up to DM and HTN  He has a past medical history of Carotid artery occlusion, DM 2, History of kidney stones, Hyperlipidemia, Hypertension, Kidney stones, Stomach ulcer, and Stroke (taking plavix daily).    DM 2 A1c improved and down from 7.6 to 7.0. Currently prescribed Trulicity 0.75 mg weekly, glimepiride 8 mg daily and metformin 1000 mg BID. Weight is gradually increasing.  Lab Results  Component Value Date   HGBA1C  7.0 02/18/2023   Lab Results  Component Value Date   LDLCALC 75 05/08/2022     HTN Blood pressure is slightly elevated. He reports normal blood pressure readings at home. Taking amlodipine and losartan daily as prescribed.  BP Readings from Last 3 Encounters:  02/18/23 (!) 146/78  11/14/22 131/74  10/29/22 (!) 142/71     GU Frequent urination. At least 4 times at night. He did also start taking his metformin later at night around 11pm and nocturia started after this.    Review of Systems  Constitutional:  Negative for fever, malaise/fatigue and weight loss.  HENT: Negative.  Negative for nosebleeds.   Eyes: Negative.  Negative for blurred vision, double vision and photophobia.  Respiratory: Negative.  Negative for cough and shortness of breath.   Cardiovascular: Negative.  Negative for chest pain, palpitations and leg swelling.  Gastrointestinal: Negative.  Negative for heartburn, nausea and vomiting.  Genitourinary:  Positive for frequency. Negative for dysuria, flank pain, hematuria and urgency.  Musculoskeletal: Negative.  Negative for myalgias.  Neurological: Negative.  Negative for dizziness, focal weakness, seizures and headaches.  Psychiatric/Behavioral: Negative.  Negative for suicidal ideas.     Past Medical History:  Diagnosis Date   Ascending aortic aneurysm (HCC) 01/17/2022   Carotid artery occlusion    Diabetes mellitus without complication (HCC)    type II   History of kidney stones    Hyperlipidemia    Hypertension    Kidney stones    Stomach ulcer  Stroke [facility])    Dec. 14 2018    Past Surgical History:  Procedure Laterality Date   CYSTOSCOPY Left 12/10/2016   Procedure: cystoscopy with left ureteral stone extraction ;  Surgeon: Ihor Gully, MD;  Location: [facility];  Service: Urology;  Laterality: Left;   HERNIA REPAIR     Stomach ulcer      Family History  Problem Relation Age of Onset   Diabetes Father    Stroke Father    Hypertension Father     Hypertension Mother    Diabetes Sister    Diabetes Son    Diabetes Daughter     Social History Reviewed with no changes to be made today.   Outpatient Medications Prior to Visit  Medication Sig Dispense Refill   Accu-Chek Softclix Lancets lancets Check blood glucose level by fingerstick three times per day. 100 each 2   amLODipine (NORVASC) 10 MG tablet Take 1 tablet (10 mg total) by mouth daily. 100 tablet 3   atorvastatin (LIPITOR) 80 MG tablet TAKE 1 TABLET BY MOUTH DAILY 90 tablet 3   Blood Glucose Monitoring Suppl (ACCU-CHEK GUIDE) w/Device KIT Check blood glucose level by fingerstick three times per day. 1 kit 0   clopidogrel (PLAVIX) 75 MG tablet TAKE 1 TABLET BY MOUTH DAILY 90 tablet 3   glimepiride (AMARYL) 4 MG tablet Take 2 tablets (8 mg total) by mouth daily with breakfast. 200 tablet 3   losartan (COZAAR) 50 MG tablet Take 1 tablet (50 mg total) by mouth daily. 100 tablet 3   metFORMIN (GLUCOPHAGE) 1000 MG tablet TAKE 1 TABLET BY MOUTH TWICE  DAILY WITH A MEAL 200 tablet 2   TRULICITY 0.75 MG/0.5ML SOPN Inject 0.75 mg into the skin once a week.     glucose blood (ACCU-CHEK GUIDE) test strip Check blood glucose level by fingerstick three times per day. 100 each 2   losartan (COZAAR) 50 MG tablet Take 1 tablet (50 mg total) by mouth daily. 30 tablet 0   No facility-administered medications prior to visit.    No Known Allergies     Objective:    BP (!) 146/78   Pulse 79   Wt 189 lb 3.2 oz (85.8 kg)   SpO2 99%   BMI 27.15 kg/m  Wt Readings from Last 3 Encounters:  02/18/23 189 lb 3.2 oz (85.8 kg)  12/19/22 186 lb (84.4 kg)  11/14/22 186 lb 9.6 oz (84.6 kg)    Physical Exam Vitals and nursing note reviewed.  Constitutional:      Appearance: He is well-developed.  HENT:     Head: Normocephalic and atraumatic.  Cardiovascular:     Rate and Rhythm: Normal rate and regular rhythm.     Heart sounds: Normal heart sounds. No murmur heard.    No friction rub. No  gallop.  Pulmonary:     Effort: Pulmonary effort is normal. No tachypnea or respiratory distress.     Breath sounds: Normal breath sounds. No decreased breath sounds, wheezing, rhonchi or rales.  Chest:     Chest wall: No tenderness.  Abdominal:     General: Bowel sounds are normal.     Palpations: Abdomen is soft.  Musculoskeletal:        General: Normal range of motion.     Cervical back: Normal range of motion.  Skin:    General: Skin is warm and dry.  Neurological:     Mental Status: He is alert and oriented to person, place, and time.  Coordination: Coordination normal.  Psychiatric:        Behavior: Behavior normal. Behavior is cooperative.        Thought Content: Thought content normal.        Judgment: Judgment normal.          Patient has been counseled extensively about nutrition and exercise as well as the importance of adherence with medications and regular follow-up. The patient was given clear instructions to go to ER or return to medical center if symptoms don't improve, worsen or new problems develop. The patient verbalized understanding.   Follow-up: Return in about 3 months (around 05/21/2023) for physical, NON FASTING LABS.   Claiborne Rigg, FNP-BC [facility] and [facility] [place], [place] [phone]   02/18/2023, 10:48 AM

## 2023-02-19 LAB — URINALYSIS, COMPLETE
Bilirubin, UA: NEGATIVE
Glucose, UA: NEGATIVE
Ketones, UA: NEGATIVE
Leukocytes,UA: NEGATIVE
Nitrite, UA: NEGATIVE
RBC, UA: NEGATIVE
Specific Gravity, UA: 1.02 (ref 1.005–1.030)
Urobilinogen, Ur: 0.2 mg/dL (ref 0.2–1.0)
pH, UA: 5.5 (ref 5.0–7.5)

## 2023-02-19 LAB — MICROSCOPIC EXAMINATION
Bacteria, UA: NONE SEEN
Casts: NONE SEEN /[LPF]
Epithelial Cells (non renal): NONE SEEN /[HPF] (ref 0–10)
RBC, Urine: NONE SEEN /[HPF] (ref 0–2)
WBC, UA: NONE SEEN /[HPF] (ref 0–5)

## 2023-02-19 LAB — PSA: Prostate Specific Ag, Serum: 1.5 ng/mL (ref 0.0–4.0)

## 2023-02-25 ENCOUNTER — Other Ambulatory Visit: Payer: Self-pay

## 2023-02-27 ENCOUNTER — Other Ambulatory Visit: Payer: Self-pay | Admitting: Nurse Practitioner

## 2023-02-27 NOTE — Telephone Encounter (Signed)
Requested medication (s) are due for refill today: Yes  Requested medication (s) are on the active medication list: Yes  Last refill:  01/17/22  Future visit scheduled: Yes  Notes to clinic:  Historical provider.    Requested Prescriptions  Pending Prescriptions Disp Refills   TRULICITY 0.75 MG/0.5ML SOPN [Pharmacy Med Name: Trulicity Subcutaneous Solution Auto-injector 0.75 MG/0.5ML] 8 mL 0    Sig: INJECT 0.75MG  (0.5ML) UNDER THE SKIN ONCE A WEEK.     Endocrinology:  Diabetes - GLP-1 Receptor Agonists Passed - 02/27/2023  8:07 AM      Passed - HBA1C is between 0 and 7.9 and within 180 days    HbA1c, POC (prediabetic range)  Date Value Ref Range Status  10/30/2021 6.4 5.7 - 6.4 % Final   HbA1c, POC (controlled diabetic range)  Date Value Ref Range Status  02/18/2023 7.0 0.0 - 7.0 % Final         Passed - Valid encounter within last 6 months    Recent Outpatient Visits           1 week ago Type 2 diabetes mellitus with vascular disease [facility])   [place] [facility] [place], [place] Stakes, NP   3 months ago Primary hypertension   [place] [facility] [place], [place] W, NP   6 months ago Type 2 diabetes mellitus without complication, without long-term current use of insulin [facility])   [place] [facility] [place], [place] W, NP   9 months ago Type 2 diabetes mellitus without complication, without long-term current use of insulin [facility])   [place] [facility] [place], [place] W, NP   1 year ago Type 2 diabetes mellitus without complication, without long-term current use of insulin [facility])    [facility] [place], Shea Stakes, NP       Future Appointments             In 2 months Claiborne Rigg, NP [facility]

## 2023-03-01 ENCOUNTER — Ambulatory Visit: Payer: Self-pay | Admitting: *Deleted

## 2023-03-01 NOTE — Telephone Encounter (Signed)
  Chief Complaint: low back left side pain  hx kidney stones Symptoms: low back left side pain , pain level 7 . No issues with urination . Frequency: 1-2 weeks  Pertinent Negatives: Patient denies fever, no issues with urination  Disposition: [] ED /[x] Urgent Care (no appt availability in office) / [] Appointment(In office/virtual)/ []  [facility]/ [] Home Care/ [] Refused Recommended Disposition /[] [facility] [facility]/ []  Follow-up with PCP Additional Notes:   No available appt until 03/09/23.  Recommended UC .       Reason for Disposition  [1] MODERATE back pain (e.g., interferes with normal activities) AND [2] present > 3 days  Answer Assessment - Initial Assessment Questions 1. ONSET: "When did the pain begin?"      1-2 weeks  2. LOCATION: "Where does it hurt?" (upper, mid or lower back)     Low back left side  3. SEVERITY: "How bad is the pain?"  (e.g., Scale 1-10; mild, moderate, or severe)   - MILD (1-3): Doesn't interfere with normal activities.    - MODERATE (4-7): Interferes with normal activities or awakens from sleep.    - SEVERE (8-10): Excruciating pain, unable to do any normal activities.      Moderate pain level 7 wakes out of sleep 4. PATTERN: "Is the pain constant?" (e.g., yes, no; constant, intermittent)      Constant  5. RADIATION: "Does the pain shoot into your legs or somewhere else?"     Na  6. CAUSE:  "What do you think is causing the back pain?"      Not sure possible kidney stones 7. BACK OVERUSE:  "Any recent lifting of heavy objects, strenuous work or exercise?"     na 8. MEDICINES: "What have you taken so far for the pain?" (e.g., nothing, acetaminophen, NSAIDS)     na 9. NEUROLOGIC SYMPTOMS: "Do you have any weakness, numbness, or problems with bowel/bladder control?"     Na  10. OTHER SYMPTOMS: "Do you have any other symptoms?" (e.g., fever, abdomen pain, burning with urination, blood in urine)       Low back left side pain , level 7 no  reports of issues with urination or color of urine 11. PREGNANCY: "Is there any chance you are pregnant?" "When was your last menstrual period?"       na  Protocols used: Back Pain-A-AH

## 2023-03-01 NOTE — Telephone Encounter (Signed)
Noted  

## 2023-03-03 ENCOUNTER — Emergency Department (HOSPITAL_COMMUNITY)
Admission: EM | Admit: 2023-03-03 | Discharge: 2023-03-04 | Disposition: A | Discharge status: Home / Self Care | Payer: Medicare Other | Attending: Emergency Medicine | Admitting: Emergency Medicine

## 2023-03-03 ENCOUNTER — Encounter (HOSPITAL_COMMUNITY): Payer: Self-pay | Admitting: Emergency Medicine

## 2023-03-03 ENCOUNTER — Other Ambulatory Visit: Payer: Self-pay

## 2023-03-03 DIAGNOSIS — I10 Essential (primary) hypertension: Secondary | ICD-10-CM | POA: Diagnosis not present

## 2023-03-03 DIAGNOSIS — R109 Unspecified abdominal pain: Secondary | ICD-10-CM

## 2023-03-03 DIAGNOSIS — Z7901 Long term (current) use of anticoagulants: Secondary | ICD-10-CM | POA: Diagnosis not present

## 2023-03-03 DIAGNOSIS — E119 Type 2 diabetes mellitus without complications: Secondary | ICD-10-CM | POA: Diagnosis not present

## 2023-03-03 DIAGNOSIS — Z7984 Long term (current) use of oral hypoglycemic drugs: Secondary | ICD-10-CM | POA: Diagnosis not present

## 2023-03-03 DIAGNOSIS — Z79899 Other long term (current) drug therapy: Secondary | ICD-10-CM | POA: Insufficient documentation

## 2023-03-03 DIAGNOSIS — N200 Calculus of kidney: Secondary | ICD-10-CM | POA: Diagnosis not present

## 2023-03-03 DIAGNOSIS — N3289 Other specified disorders of bladder: Secondary | ICD-10-CM | POA: Diagnosis not present

## 2023-03-03 LAB — URINALYSIS, ROUTINE W REFLEX MICROSCOPIC
Bilirubin Urine: NEGATIVE
Glucose, UA: NEGATIVE mg/dL
Hgb urine dipstick: NEGATIVE
Ketones, ur: NEGATIVE mg/dL
Leukocytes,Ua: NEGATIVE
Nitrite: NEGATIVE
Protein, ur: NEGATIVE mg/dL
Specific Gravity, Urine: 1.013 (ref 1.005–1.030)
pH: 5 (ref 5.0–8.0)

## 2023-03-03 NOTE — ED Triage Notes (Signed)
Pt in with L flank pain, hx of kidney stones. Denies any hematuria or fevers

## 2023-03-04 ENCOUNTER — Other Ambulatory Visit: Payer: Self-pay

## 2023-03-04 ENCOUNTER — Emergency Department (HOSPITAL_COMMUNITY): Payer: Medicare Other

## 2023-03-04 DIAGNOSIS — R109 Unspecified abdominal pain: Secondary | ICD-10-CM | POA: Diagnosis not present

## 2023-03-04 DIAGNOSIS — N3289 Other specified disorders of bladder: Secondary | ICD-10-CM | POA: Diagnosis not present

## 2023-03-04 DIAGNOSIS — N200 Calculus of kidney: Secondary | ICD-10-CM | POA: Diagnosis not present

## 2023-03-04 LAB — CBC WITH DIFFERENTIAL/PLATELET
Abs Immature Granulocytes: 0.01 10*3/uL (ref 0.00–0.07)
Basophils Absolute: 0.1 10*3/uL (ref 0.0–0.1)
Basophils Relative: 1 %
Eosinophils Absolute: 0.3 10*3/uL (ref 0.0–0.5)
Eosinophils Relative: 7 %
HCT: 35.9 % — ABNORMAL LOW (ref 39.0–52.0)
Hemoglobin: 10.9 g/dL — ABNORMAL LOW (ref 13.0–17.0)
Immature Granulocytes: 0 %
Lymphocytes Relative: 32 %
Lymphs Abs: 1.4 10*3/uL (ref 0.7–4.0)
MCH: 24.2 pg — ABNORMAL LOW (ref 26.0–34.0)
MCHC: 30.4 g/dL (ref 30.0–36.0)
MCV: 79.8 fL — ABNORMAL LOW (ref 80.0–100.0)
Monocytes Absolute: 0.3 10*3/uL (ref 0.1–1.0)
Monocytes Relative: 8 %
Neutro Abs: 2.3 10*3/uL (ref 1.7–7.7)
Neutrophils Relative %: 52 %
Platelets: 211 10*3/uL (ref 150–400)
RBC: 4.5 MIL/uL (ref 4.22–5.81)
RDW: 16 % — ABNORMAL HIGH (ref 11.5–15.5)
WBC: 4.4 10*3/uL (ref 4.0–10.5)
nRBC: 0 % (ref 0.0–0.2)

## 2023-03-04 LAB — BASIC METABOLIC PANEL
Anion gap: 10 (ref 5–15)
BUN: 12 mg/dL (ref 8–23)
CO2: 26 mmol/L (ref 22–32)
Calcium: 9 mg/dL (ref 8.9–10.3)
Chloride: 104 mmol/L (ref 98–111)
Creatinine, Ser: 0.83 mg/dL (ref 0.61–1.24)
GFR, Estimated: >60 mL/min (ref >60)
Glucose, Bld: 110 mg/dL — ABNORMAL HIGH (ref 70–99)
Potassium: 3.9 mmol/L (ref 3.5–5.1)
Sodium: 140 mmol/L (ref 135–145)

## 2023-03-04 MED ORDER — OXYCODONE-ACETAMINOPHEN 5-325 MG PO TABS: 1.0000 | ORAL_TABLET | ORAL | 0 refills | Status: DC | PRN

## 2023-03-04 MED ORDER — OXYCODONE-ACETAMINOPHEN 5-325 MG PO TABS
1.0000 | ORAL_TABLET | ORAL | 0 refills | Status: DC | PRN
  Filled 2023-03-04: qty 12, 2d supply, fill #0

## 2023-03-04 MED ORDER — OXYCODONE-ACETAMINOPHEN 5-325 MG PO TABS
1.0000 | ORAL_TABLET | Freq: Once | ORAL | Status: AC
  Administered 2023-03-04: 1 via ORAL
  Filled 2023-03-04: qty 1

## 2023-03-04 NOTE — Discharge Instructions (Signed)
CT today did not show any acute stones. Take medications as prescribed. If symptoms persistent, recommend to follow-up with urology. Return to the ED for new or worsening symptoms.

## 2023-03-04 NOTE — ED Provider Notes (Signed)
[place] EMERGENCY DEPARTMENT AT [facility] Provider Note   CSN: [id number] Arrival date & time: 03/03/23  2227     History  Chief Complaint  Patient presents with   Flank Pain    Jon [name] is a 67 y.o. male.   Flank Pain   67 year old male with history of prior stroke, dyslipidemia, hypertension, diabetes, presenting to the ED with left flank pain.  This has been ongoing for about 2 weeks now.  Pain filled on a daily basis but varying in severity.  Currently 5/10.  He denies any associated dysuria or difficulty urinating.  No nausea or vomiting.  Does have history of kidney stones in the past with similar symptoms.  Denies any fever.  Took some Tylenol last night which helped a little.  Home Medications Prior to Admission medications   Medication Sig Start Date End Date Taking? Authorizing Provider  oxyCODONE-acetaminophen (PERCOCET) 5-325 MG tablet Take 1 tablet by mouth every 4 (four) hours as needed. 03/04/23  Yes Garlon Hatchet, PA-C  Accu-Chek Softclix Lancets lancets Check blood glucose level by fingerstick three times per day. 04/03/22   Hoy Register, MD  amLODipine (NORVASC) 10 MG tablet Take 1 tablet (10 mg total) by mouth daily. 08/13/22   Claiborne Rigg, NP  atorvastatin (LIPITOR) 80 MG tablet TAKE 1 TABLET BY MOUTH DAILY 07/19/22   Claiborne Rigg, NP  Blood Glucose Monitoring Suppl (ACCU-CHEK GUIDE) w/Device KIT Check blood glucose level by fingerstick three times per day. 04/03/22   Hoy Register, MD  clopidogrel (PLAVIX) 75 MG tablet TAKE 1 TABLET BY MOUTH DAILY 07/19/22   Claiborne Rigg, NP  Dulaglutide (TRULICITY) 0.75 MG/0.5ML SOPN INJECT 0.75MG  (0.5ML) UNDER THE SKIN ONCE A WEEK. 02/27/23   Hoy Register, MD  glimepiride (AMARYL) 4 MG tablet Take 2 tablets (8 mg total) by mouth daily with breakfast. 08/13/22   Claiborne Rigg, NP  glucose blood (ACCU-CHEK GUIDE) test strip Check blood glucose level by fingerstick three times per day.  02/18/23   Claiborne Rigg, NP  losartan (COZAAR) 50 MG tablet Take 1 tablet (50 mg total) by mouth daily. 08/13/22   Claiborne Rigg, NP  metFORMIN (GLUCOPHAGE) 1000 MG tablet TAKE 1 TABLET BY MOUTH TWICE  DAILY WITH A MEAL 10/10/22   Claiborne Rigg, NP      Allergies    Patient has no known allergies.    Review of Systems   Review of Systems  Genitourinary:  Positive for flank pain.  All other systems reviewed and are negative.   Physical Exam Updated Vital Signs BP (!) 162/86 (BP Location: Left Arm)   Pulse 65   Temp 98 F (36.7 C) (Oral)   Resp 17   Wt 85.8 kg   SpO2 100%   BMI 27.15 kg/m  Physical Exam Vitals and nursing note reviewed.  Constitutional:      Appearance: He is well-developed.  HENT:     Head: Normocephalic and atraumatic.  Eyes:     Conjunctiva/sclera: Conjunctivae normal.     Pupils: Pupils are equal, round, and reactive to light.  Cardiovascular:     Rate and Rhythm: Normal rate and regular rhythm.     Heart sounds: Normal heart sounds.  Pulmonary:     Effort: Pulmonary effort is normal.     Breath sounds: Normal breath sounds.  Abdominal:     General: Bowel sounds are normal.     Palpations: Abdomen is soft.  Tenderness: There is no abdominal tenderness.     Comments: No focal CVA tenderness, no rash or overlying skin changes to the flank  Musculoskeletal:        General: Normal range of motion.     Cervical back: Normal range of motion.  Skin:    General: Skin is warm and dry.  Neurological:     Mental Status: He is alert and oriented to person, place, and time.     ED Results / Procedures / Treatments   Labs (all labs ordered are listed, but only abnormal results are displayed) Labs Reviewed  CBC WITH DIFFERENTIAL/PLATELET - Abnormal; Notable for the following components:      Result Value   Hemoglobin 10.9 (*)    HCT 35.9 (*)    MCV 79.8 (*)    MCH 24.2 (*)    RDW 16.0 (*)    All other components within normal limits   BASIC METABOLIC PANEL - Abnormal; Notable for the following components:   Glucose, Bld 110 (*)    All other components within normal limits  URINALYSIS, ROUTINE W REFLEX MICROSCOPIC    EKG None  Radiology CT Renal Stone Study  Result Date: 03/04/2023 CLINICAL DATA:  Left-sided flank pain EXAM: CT ABDOMEN AND PELVIS WITHOUT CONTRAST TECHNIQUE: Multidetector CT imaging of the abdomen and pelvis was performed following the standard protocol without IV contrast. RADIATION DOSE REDUCTION: This exam was performed according to the departmental dose-optimization program which includes automated exposure control, adjustment of the mA and/or kV according to patient size and/or use of iterative reconstruction technique. COMPARISON:  12/02/2016 FINDINGS: Lower chest: Lungs are well aerated bilaterally. No focal infiltrate or effusion is seen. Hepatobiliary: No focal liver abnormality is seen. No gallstones, gallbladder wall thickening, or biliary dilatation. Pancreas: Unremarkable. No pancreatic ductal dilatation or surrounding inflammatory changes. Spleen: Normal in size without focal abnormality. Adrenals/Urinary Tract: Adrenal glands are within normal limits. Right kidney shows no renal calculi or obstructive changes. Left kidney shows nonobstructing stones measuring up to 8 mm in the lower pole of the left kidney. No obstructive changes are seen. The bladder is partially distended. Stomach/Bowel: No obstructive or inflammatory changes of the colon are seen. The appendix is small but within normal limits. No inflammatory changes are seen. Small bowel and stomach are within normal limits. Vascular/Lymphatic: Aortic atherosclerosis. No enlarged abdominal or pelvic lymph nodes. Reproductive: Prostate is unremarkable. Other: No abdominal wall hernia or abnormality. No abdominopelvic ascites. Musculoskeletal: No acute or significant osseous findings. IMPRESSION: Nonobstructing left renal stones without complicating  factors. No other focal abnormality is noted. Electronically Signed   By: Alcide Clever M.D.   On: 03/04/2023 01:59    Procedures Procedures    Medications Ordered in ED Medications  oxyCODONE-acetaminophen (PERCOCET/ROXICET) 5-325 MG per tablet 1 tablet (1 tablet Oral Given 03/04/23 0155)    ED Course/ Medical Decision Making/ A&P                                 Medical Decision Making Amount and/or Complexity of Data Reviewed Labs: ordered. Radiology: ordered and independent interpretation performed. ECG/medicine tests: ordered and independent interpretation performed.  Risk Prescription drug management.   67 year old male here with left flank pain for the past 2 weeks.  History of kidney stones with similar symptoms.  He is afebrile and nontoxic.  Does not have any focal tenderness to the left flank.  He has no  rash or other overlying skin changes to suggest shingles.  Labs are obtained without leukocytosis or electrolyte derangement.  Normal renal function.  UA without any signs of infection or hematuria.  CT scan with left renal stones but no ureteral stone or other acute finding. Feeling better here after dose of Percocet.  Will discharge home with few days of pain medication.  Encouraged to follow-up with urology if symptoms persist.  Return here for new concerns.  Final Clinical Impression(s) / ED Diagnoses Final diagnoses:  Flank pain    Rx / DC Orders ED Discharge Orders          Ordered    oxyCODONE-acetaminophen (PERCOCET) 5-325 MG tablet  Every 4 hours PRN        03/04/23 0240              Garlon Hatchet, PA-C 03/04/23 0246    Sabas Sous, MD 03/04/23 [phone]

## 2023-03-05 ENCOUNTER — Other Ambulatory Visit: Payer: Self-pay

## 2023-03-15 ENCOUNTER — Other Ambulatory Visit: Payer: Self-pay

## 2023-03-22 ENCOUNTER — Ambulatory Visit: Payer: Medicare Other | Attending: Nurse Practitioner

## 2023-03-22 DIAGNOSIS — Z23 Encounter for immunization: Secondary | ICD-10-CM

## 2023-03-22 NOTE — Progress Notes (Cosign Needed Addendum)
Flu vaccine administered per protocols.  Information sheet given. Patient denies and pain or discomfort at injection site. Tolerated injection well no reaction.  

## 2023-03-25 ENCOUNTER — Other Ambulatory Visit: Payer: Self-pay

## 2023-03-26 ENCOUNTER — Other Ambulatory Visit: Payer: Self-pay

## 2023-03-26 ENCOUNTER — Telehealth: Payer: Self-pay

## 2023-03-26 NOTE — Telephone Encounter (Signed)
Received notification from [facility] CARES regarding approval for TRULICITY 0.75MG /0.5ML. Patient assistance approved from 05/15/2023 to 05/13/2024.  Medication will ship to [address] Jon [name] [place], [place]  [address]  Pt ID: [id number]  Company phone: [phone]

## 2023-04-17 ENCOUNTER — Other Ambulatory Visit: Payer: Self-pay | Admitting: Nurse Practitioner

## 2023-04-17 DIAGNOSIS — I63332 Cerebral infarction due to thrombosis of left posterior cerebral artery: Secondary | ICD-10-CM

## 2023-05-09 ENCOUNTER — Other Ambulatory Visit: Payer: Self-pay

## 2023-05-09 ENCOUNTER — Other Ambulatory Visit: Payer: Self-pay | Admitting: Nurse Practitioner

## 2023-05-09 DIAGNOSIS — E1159 Type 2 diabetes mellitus with other circulatory complications: Secondary | ICD-10-CM

## 2023-05-21 ENCOUNTER — Encounter: Payer: Self-pay | Admitting: Nurse Practitioner

## 2023-05-21 ENCOUNTER — Ambulatory Visit: Payer: Medicare Other | Attending: Nurse Practitioner | Admitting: Nurse Practitioner

## 2023-05-21 VITALS — BP 153/73 | HR 94 | Resp 19 | Ht 70.0 in | Wt 192.6 lb

## 2023-05-21 DIAGNOSIS — E1159 Type 2 diabetes mellitus with other circulatory complications: Secondary | ICD-10-CM | POA: Diagnosis not present

## 2023-05-21 DIAGNOSIS — D649 Anemia, unspecified: Secondary | ICD-10-CM | POA: Diagnosis not present

## 2023-05-21 DIAGNOSIS — Z7985 Long-term (current) use of injectable non-insulin antidiabetic drugs: Secondary | ICD-10-CM

## 2023-05-21 DIAGNOSIS — Z7984 Long term (current) use of oral hypoglycemic drugs: Secondary | ICD-10-CM

## 2023-05-21 DIAGNOSIS — R590 Localized enlarged lymph nodes: Secondary | ICD-10-CM

## 2023-05-21 DIAGNOSIS — Z0001 Encounter for general adult medical examination with abnormal findings: Secondary | ICD-10-CM | POA: Diagnosis not present

## 2023-05-21 DIAGNOSIS — Z0000 Encounter for general adult medical examination without abnormal findings: Secondary | ICD-10-CM

## 2023-05-21 NOTE — Progress Notes (Signed)
 Assessment & Plan:  Jon [name] was seen today for annual exam.  Diagnoses and all orders for this visit:  Annual physical exam  Type 2 diabetes mellitus with vascular disease (HCC) -     Urine Albumin/Creatinine with ratio (send out) [LAB689] -     Hemoglobin A1c  Submandibular lymphadenopathy -     US  Soft Tissue Head/Neck (NON-THYROID ); Future  Anemia, unspecified type -     CBC with Differential    Patient has been counseled on age-appropriate routine health concerns for screening and prevention. These are reviewed and up-to-date. Referrals have been placed accordingly. Immunizations are up-to-date or declined.    Subjective:   Chief Complaint  Patient presents with   Annual Exam    Jon [name] 68 y.o. male presents to office today for annual physical exam.   BP Readings from Last 3 Encounters:  05/21/23 (!) 153/73  03/04/23 (!) 154/71  02/18/23 (!) 146/78    HPI  Review of Systems  Constitutional:  Negative for fever, malaise/fatigue and weight loss.  HENT: Negative.  Negative for nosebleeds.   Eyes: Negative.  Negative for blurred vision, double vision and photophobia.  Respiratory: Negative.  Negative for cough and shortness of breath.   Cardiovascular: Negative.  Negative for chest pain, palpitations and leg swelling.  Gastrointestinal: Negative.  Negative for heartburn, nausea and vomiting.  Genitourinary: Negative.   Musculoskeletal: Negative.  Negative for myalgias.  Skin: Negative.   Neurological: Negative.  Negative for dizziness, focal weakness, seizures and headaches.  Endo/Heme/Allergies: Negative.   Psychiatric/Behavioral: Negative.  Negative for suicidal ideas.     Past Medical History:  Diagnosis Date   Ascending aortic aneurysm (HCC) 01/17/2022   Carotid artery occlusion    Diabetes mellitus without complication (HCC)    type II   History of kidney stones    Hyperlipidemia    Hypertension    Kidney stones    Stomach ulcer    Stroke  [facility])    Dec. 14 2018    Past Surgical History:  Procedure Laterality Date   CYSTOSCOPY Left 12/10/2016   Procedure: cystoscopy with left ureteral stone extraction ;  Surgeon: Ceil Anes, MD;  Location: [facility];  Service: Urology;  Laterality: Left;   HERNIA REPAIR     Stomach ulcer      Family History  Problem Relation Age of Onset   Diabetes Father    Stroke Father    Hypertension Father    Hypertension Mother    Diabetes Sister    Diabetes Son    Diabetes Daughter     Social History Reviewed with no changes to be made today.   Outpatient Medications Prior to Visit  Medication Sig Dispense Refill   Accu-Chek Softclix Lancets lancets Check blood glucose level by fingerstick three times per day. 100 each 2   amLODipine  (NORVASC ) 10 MG tablet Take 1 tablet (10 mg total) by mouth daily. 100 tablet 3   atorvastatin  (LIPITOR ) 80 MG tablet TAKE 1 TABLET BY MOUTH ONCE  DAILY 100 tablet 2   Blood Glucose Monitoring Suppl (ACCU-CHEK GUIDE) w/Device KIT Check blood glucose level by fingerstick three times per day. 1 kit 0   clopidogrel  (PLAVIX ) 75 MG tablet TAKE 1 TABLET BY MOUTH DAILY 100 tablet 2   Dulaglutide  (TRULICITY ) 0.75 MG/0.5ML SOPN INJECT 0.75MG  (0.5ML) UNDER THE SKIN ONCE A WEEK. 6 mL 1   glimepiride  (AMARYL ) 4 MG tablet Take 2 tablets (8 mg total) by mouth daily with breakfast. 200 tablet  3   Glucose Blood (BLOOD GLUCOSE TEST STRIPS) STRP Check blood glucose level by fingerstick three times per day. 200 each 6   losartan  (COZAAR ) 50 MG tablet Take 1 tablet (50 mg total) by mouth daily. 100 tablet 3   metFORMIN  (GLUCOPHAGE ) 1000 MG tablet TAKE 1 TABLET BY MOUTH TWICE  DAILY WITH A MEAL 200 tablet 2   oxyCODONE -acetaminophen  (PERCOCET) 5-325 MG tablet Take 1 tablet by mouth every 4 (four) hours as needed. (Patient not taking: Reported on 05/21/2023) 12 tablet 0   oxyCODONE -acetaminophen  (PERCOCET) 5-325 MG tablet Take 1 tablet by mouth every 4 (four) hours as needed. (Patient  not taking: Reported on 05/21/2023) 12 tablet 0   No facility-administered medications prior to visit.    No Known Allergies     Objective:    BP (!) 153/73   Pulse 94   Resp 19   Ht 5' 10 (1.778 m)   Wt 192 lb 9.6 oz (87.4 kg)   SpO2 97%   BMI 27.64 kg/m  Wt Readings from Last 3 Encounters:  05/21/23 192 lb 9.6 oz (87.4 kg)  03/03/23 189 lb 3.2 oz (85.8 kg)  02/18/23 189 lb 3.2 oz (85.8 kg)    Physical Exam Constitutional:      Appearance: He is well-developed.  HENT:     Head: Normocephalic and atraumatic.     Right Ear: Hearing, tympanic membrane, ear canal and external ear normal.     Left Ear: Hearing, tympanic membrane, ear canal and external ear normal.     Nose: Nose normal. No mucosal edema or rhinorrhea.     Right Turbinates: Not enlarged.     Left Turbinates: Not enlarged.     Mouth/Throat:     Lips: Pink.     Mouth: Mucous membranes are moist.     Dentition: No gingival swelling, dental abscesses or gum lesions.     Pharynx: Uvula midline.     Tonsils: No tonsillar exudate. 1+ on the right. 1+ on the left.  Eyes:     General: Lids are normal. No scleral icterus.    Extraocular Movements: Extraocular movements intact.     Conjunctiva/sclera: Conjunctivae normal.     Pupils: Pupils are equal, round, and reactive to light.     Comments: Wears glasses  Neck:     Thyroid : No thyromegaly.     Trachea: No tracheal deviation.  Cardiovascular:     Rate and Rhythm: Normal rate and regular rhythm.     Heart sounds: Normal heart sounds. No murmur heard.    No friction rub. No gallop.  Pulmonary:     Effort: Pulmonary effort is normal. No respiratory distress.     Breath sounds: Normal breath sounds. No wheezing or rales.  Chest:     Chest wall: No mass or tenderness.  Breasts:    Right: No inverted nipple, mass, nipple discharge, skin change or tenderness.     Left: No inverted nipple, mass, nipple discharge, skin change or tenderness.  Abdominal:      General: Bowel sounds are normal. There is no distension.     Palpations: Abdomen is soft. There is no mass.     Tenderness: There is no abdominal tenderness. There is no guarding or rebound.  Musculoskeletal:        General: No tenderness or deformity. Normal range of motion.     Cervical back: Normal range of motion and neck supple.  Lymphadenopathy:     Head:  Left side of head: Submandibular adenopathy present.     Cervical: No cervical adenopathy.  Skin:    General: Skin is warm and dry.     Capillary Refill: Capillary refill takes less than 2 seconds.     Findings: No erythema.  Neurological:     Mental Status: He is alert and oriented to person, place, and time.     Cranial Nerves: No cranial nerve deficit.     Sensory: Sensation is intact.     Motor: No abnormal muscle tone.     Coordination: Coordination is intact. Coordination normal.     Gait: Gait is intact.     Deep Tendon Reflexes: Reflexes normal.     Reflex Scores:      Patellar reflexes are 1+ on the right side and 1+ on the left side. Psychiatric:        Attention and Perception: Attention normal.        Mood and Affect: Mood normal.        Speech: Speech normal.        Behavior: Behavior normal.        Thought Content: Thought content normal.        Judgment: Judgment normal.          Patient has been counseled extensively about nutrition and exercise as well as the importance of adherence with medications and regular follow-up. The patient was given clear instructions to go to ER or return to medical center if symptoms don't improve, worsen or new problems develop. The patient verbalized understanding.   Follow-up: Return in about 3 months (around 08/19/2023).   Haze LELON Servant, FNP-BC [facility] [place], [place] [phone]   05/21/2023, 11:35 AM

## 2023-05-22 LAB — HEMOGLOBIN A1C
Est. average glucose Bld gHb Est-mCnc: 166 mg/dL
Hgb A1c MFr Bld: 7.4 % — ABNORMAL HIGH (ref 4.8–5.6)

## 2023-05-22 LAB — CBC WITH DIFFERENTIAL/PLATELET
Basophils Absolute: 0.1 10*3/uL (ref 0.0–0.2)
Basos: 1 %
EOS (ABSOLUTE): 0.3 10*3/uL (ref 0.0–0.4)
Eos: 5 %
Hematocrit: 39.4 % (ref 37.5–51.0)
Hemoglobin: 12 g/dL — ABNORMAL LOW (ref 13.0–17.7)
Immature Grans (Abs): 0 10*3/uL (ref 0.0–0.1)
Immature Granulocytes: 0 %
Lymphocytes Absolute: 1.4 10*3/uL (ref 0.7–3.1)
Lymphs: 24 %
MCH: 24.2 pg — ABNORMAL LOW (ref 26.6–33.0)
MCHC: 30.5 g/dL — ABNORMAL LOW (ref 31.5–35.7)
MCV: 79 fL (ref 79–97)
Monocytes Absolute: 0.5 10*3/uL (ref 0.1–0.9)
Monocytes: 8 %
Neutrophils Absolute: 3.5 10*3/uL (ref 1.4–7.0)
Neutrophils: 62 %
Platelets: 284 10*3/uL (ref 150–450)
RBC: 4.96 x10E6/uL (ref 4.14–5.80)
RDW: 16.1 % — ABNORMAL HIGH (ref 11.6–15.4)
WBC: 5.7 10*3/uL (ref 3.4–10.8)

## 2023-05-23 LAB — MICROALBUMIN / CREATININE URINE RATIO
Creatinine, Urine: 120.8 mg/dL
Microalb/Creat Ratio: 27 mg/g{creat} (ref 0–29)
Microalbumin, Urine: 32.4 ug/mL

## 2023-05-27 ENCOUNTER — Other Ambulatory Visit: Payer: Medicare Other

## 2023-05-27 ENCOUNTER — Ambulatory Visit
Admission: RE | Admit: 2023-05-27 | Discharge: 2023-05-27 | Disposition: A | Discharge status: Home / Self Care | Payer: Medicare Other | Source: Ambulatory Visit | Attending: Nurse Practitioner | Admitting: Nurse Practitioner

## 2023-05-27 DIAGNOSIS — R221 Localized swelling, mass and lump, neck: Secondary | ICD-10-CM | POA: Diagnosis not present

## 2023-05-27 DIAGNOSIS — R590 Localized enlarged lymph nodes: Secondary | ICD-10-CM

## 2023-06-04 ENCOUNTER — Encounter: Payer: Self-pay | Admitting: Nurse Practitioner

## 2023-06-17 ENCOUNTER — Other Ambulatory Visit: Payer: Self-pay

## 2023-06-18 ENCOUNTER — Other Ambulatory Visit: Payer: Self-pay

## 2023-06-18 ENCOUNTER — Other Ambulatory Visit: Payer: Self-pay | Admitting: Nurse Practitioner

## 2023-06-18 ENCOUNTER — Other Ambulatory Visit: Payer: Self-pay | Admitting: Family Medicine

## 2023-06-18 DIAGNOSIS — E119 Type 2 diabetes mellitus without complications: Secondary | ICD-10-CM

## 2023-06-18 MED ORDER — ACCU-CHEK GUIDE W/DEVICE KIT
PACK | 0 refills | Status: AC
  Filled 2023-06-18: qty 1, 30d supply, fill #0

## 2023-06-18 NOTE — Telephone Encounter (Signed)
 Requested Prescriptions  Pending Prescriptions Disp Refills   Blood Glucose Monitoring Suppl (ACCU-CHEK GUIDE) w/Device KIT 1 kit 0    Sig: Check blood glucose level by fingerstick three times per day.     Endocrinology: Diabetes - Testing Supplies Passed - 06/18/2023  2:59 PM      Passed - Valid encounter within last 12 months    Recent Outpatient Visits           4 weeks ago Annual physical exam   [place] [facility] [place]. [facility] [place], [place] W, NP   4 months ago Type 2 diabetes mellitus with vascular disease [facility])   [place] [facility] Of [place]. [facility] [name] ORN, NP   7 months ago Primary hypertension   [place] [facility] [place] [facility] Dept Of [place]. [facility] [place], [place] W, NP   10 months ago Type 2 diabetes mellitus without complication, without long-term current use of insulin  [facility])   [place] [facility] Dept Of [place]. [facility] [place], [place] W, NP   1 year ago Type 2 diabetes mellitus without complication, without long-term current use of insulin  [facility])   [place] [facility] - [facility] Dept Of [place]. [facility] [name] ORN, NP       Future Appointments             In 2 months [name] ORN, NP [facility] - A Dept Of [place]. [facility]

## 2023-06-19 ENCOUNTER — Other Ambulatory Visit: Payer: Self-pay

## 2023-06-19 NOTE — Telephone Encounter (Signed)
 Requested by interface surescripts. Future visit in 2 months.  Requested Prescriptions  Pending Prescriptions Disp Refills   metFORMIN  (GLUCOPHAGE ) 1000 MG tablet [Pharmacy Med Name: metFORMIN  HCl 1000 MG Oral Tablet] 200 tablet 0    Sig: TAKE 1 TABLET BY MOUTH TWICE  DAILY WITH MEALS     Endocrinology:  Diabetes - Biguanides Failed - 06/19/2023  8:50 AM      Failed - B12 Level in normal range and within 720 days    Vitamin B-12  Date Value Ref Range Status  09/14/2019 1,068 232 - 1,245 pg/mL Final         Passed - Cr in normal range and within 360 days    Creatinine, Ser  Date Value Ref Range Status  03/04/2023 0.83 0.61 - 1.24 mg/dL Final   Creatinine,U  Date Value Ref Range Status  06/08/2016 171.6 mg/dL Final         Passed - HBA1C is between 0 and 7.9 and within 180 days    HbA1c, POC (prediabetic range)  Date Value Ref Range Status  10/30/2021 6.4 5.7 - 6.4 % Final   HbA1c, POC (controlled diabetic range)  Date Value Ref Range Status  02/18/2023 7.0 0.0 - 7.0 % Final   Hgb A1c MFr Bld  Date Value Ref Range Status  05/21/2023 7.4 (H) 4.8 - 5.6 % Final    Comment:             Prediabetes: 5.7 - 6.4          Diabetes: >6.4          Glycemic control for adults with diabetes: <7.0          Passed - eGFR in normal range and within 360 days    GFR calc Af Amer  Date Value Ref Range Status  06/13/2020 100 >59 mL/min/1.73 Final    Comment:    **In accordance with recommendations from the NKF-ASN Task force,**   [facility] is in the process of updating its eGFR calculation to the   2021 CKD-EPI creatinine equation that estimates kidney function   without a race variable.    GFR, Estimated  Date Value Ref Range Status  03/04/2023 >60 >60 mL/min Final    Comment:    (NOTE) Calculated using the CKD-EPI Creatinine Equation (2021)    GFR  Date Value Ref Range Status  03/13/2017 95.90 >60.00 mL/min Final   eGFR  Date Value Ref Range Status  11/14/2022 81 >59  mL/min/1.73 Final         Passed - Valid encounter within last 6 months    Recent Outpatient Visits           4 weeks ago Annual physical exam   [place] [facility] [place]. [facility] [place], [place] W, NP   4 months ago Type 2 diabetes mellitus with vascular disease [facility])   [place] [facility] Of [place]. [facility] [name] ORN, NP   7 months ago Primary hypertension   [place] [facility] [place] [facility] Of [place]. [facility] [place], [place] W, NP   10 months ago Type 2 diabetes mellitus without complication, without long-term current use of insulin  [facility])   [place] [facility] Dept Of [place]. [facility] [place], [place] W, NP   1 year ago Type 2 diabetes mellitus without complication, without long-term current use  of insulin  [facility])   [place] [facility] - A Dept Of [place]. [facility] [name] ORN, NP       Future Appointments             In 2 months [name] ORN, NP [facility] - A Dept Of [place]. [facility]            Passed - CBC within normal limits and completed in the last 12 months    WBC  Date Value Ref Range Status  05/21/2023 5.7 3.4 - 10.8 x10E3/uL Final  03/04/2023 4.4 4.0 - 10.5 K/uL Final   RBC  Date Value Ref Range Status  05/21/2023 4.96 4.14 - 5.80 x10E6/uL Final  03/04/2023 4.50 4.22 - 5.81 MIL/uL Final   Hemoglobin  Date Value Ref Range Status  05/21/2023 12.0 (L) 13.0 - 17.7 g/dL Final   Hematocrit  Date Value Ref Range Status  05/21/2023 39.4 37.5 - 51.0 % Final   MCHC  Date Value Ref Range Status  05/21/2023 30.5 (L) 31.5 - 35.7 g/dL Final  89/78/7975 69.5 30.0 - 36.0 g/dL Final   [facility]  Date Value Ref Range Status  05/21/2023 24.2 (L) 26.6 - 33.0 pg Final  03/04/2023 24.2 (L) 26.0 - 34.0 pg Final   MCV  Date Value Ref Range Status  05/21/2023 79 79 - 97 fL  Final   No results found for: PLTCOUNTKUC, LABPLAT, POCPLA RDW  Date Value Ref Range Status  05/21/2023 16.1 (H) 11.6 - 15.4 % Final

## 2023-07-02 ENCOUNTER — Ambulatory Visit: Payer: Medicare Other | Attending: Nurse Practitioner

## 2023-07-02 VITALS — Ht 70.0 in | Wt 185.0 lb

## 2023-07-02 DIAGNOSIS — Z0000 Encounter for general adult medical examination without abnormal findings: Secondary | ICD-10-CM | POA: Diagnosis not present

## 2023-07-02 NOTE — Patient Instructions (Addendum)
 Jon [name] , Thank you for taking time to come for your Medicare Wellness Visit. I appreciate your ongoing commitment to your health goals. Please review the following plan we discussed and let me know if I can assist you in the future.   Referrals/Orders/Follow-Ups/Clinician Recommendations: Yes; Keep maintaining your health by keeping your appointments with Shon Hale, NP and any specialists that you may see.  Call us if you need anything.  Have a great year!!!!  This is a list of the screening recommended for you and due dates:  Health Maintenance  Topic Date Due   COVID-19 Vaccine (4 - 2024-25 season) 01/13/2023   Complete foot exam   11/14/2023   Hemoglobin A1C  11/18/2023   Eye exam for diabetics  12/31/2023   Colon Cancer Screening  01/06/2024   Yearly kidney function blood test for diabetes  03/03/2024   Yearly kidney health urinalysis for diabetes  05/20/2024   Medicare Annual Wellness Visit  07/01/2024   DTaP/Tdap/Td vaccine (2 - Td or Tdap) 11/13/2032   Pneumonia Vaccine  Completed   Hepatitis C Screening  Completed   Zoster (Shingles) Vaccine  Completed   HPV Vaccine  Aged Out   Flu Shot  Discontinued    Advanced directives: (Declined) Advance directive discussed with you today. Even though you declined this today, please call our office should you change your mind, and we can give you the proper paperwork for you to fill out.  Next Medicare Annual Wellness Visit scheduled for next year: Yes

## 2023-07-02 NOTE — Progress Notes (Signed)
 Subjective:   Jon [name] is a 68 y.o. male who presents for Medicare Annual/Subsequent preventive examination.  Visit Complete: Virtual I connected with  Jon [name] on 07/02/23 by a audio enabled telemedicine application and verified that I am speaking with the correct person using two identifiers.  Patient Location: Home  Provider Location: Home Office  I discussed the limitations of evaluation and management by telemedicine. The patient expressed understanding and agreed to proceed.  Vital Signs: Because this visit was a virtual/telehealth visit, some criteria may be missing or patient reported. Any vitals not documented were not able to be obtained and vitals that have been documented are patient reported.  This patient declined Interactive audio and Acupuncturist. Therefore the visit was completed with audio only.   Cardiac Risk Factors include: advanced age (>92men, >31 women);diabetes mellitus;dyslipidemia;hypertension;male gender;family history of premature cardiovascular disease     Objective:    Today's Vitals   07/02/23 1203  Weight: 185 lb (83.9 kg)  Height: 5\' 10"  (1.778 m)  PainSc: 0-No pain   Body mass index is 26.54 kg/m.     07/02/2023   12:05 PM 03/03/2023   10:36 PM 12/19/2022   11:40 AM 09/10/2022   11:10 AM 04/03/2022    9:01 AM 11/08/2020    3:37 AM 11/07/2020   10:27 PM  Advanced Directives  Does Patient Have a Medical Advance Directive? No No No No No No No  Would patient like information on creating a medical advance directive? No - Patient declined No - Patient declined Yes (MAU/Ambulatory/Procedural Areas - Information given) No - Patient declined  Yes (Inpatient - patient requests chaplain consult to create a medical advance directive) No - Patient declined    Current Medications (verified) Outpatient Encounter Medications as of 07/02/2023  Medication Sig   Accu-Chek Softclix Lancets lancets Check blood glucose level by  fingerstick three times per day.   amLODipine (NORVASC) 10 MG tablet Take 1 tablet (10 mg total) by mouth daily.   atorvastatin (LIPITOR) 80 MG tablet TAKE 1 TABLET BY MOUTH ONCE  DAILY   Blood Glucose Monitoring Suppl (ACCU-CHEK GUIDE) w/Device KIT Check blood glucose level by fingerstick three times per day.   clopidogrel (PLAVIX) 75 MG tablet TAKE 1 TABLET BY MOUTH DAILY   Dulaglutide (TRULICITY) 0.75 MG/0.5ML SOPN INJECT 0.75MG  (0.5ML) UNDER THE SKIN ONCE A WEEK.   glimepiride (AMARYL) 4 MG tablet Take 2 tablets (8 mg total) by mouth daily with breakfast.   Glucose Blood (BLOOD GLUCOSE TEST STRIPS) STRP Check blood glucose level by fingerstick three times per day.   losartan (COZAAR) 50 MG tablet Take 1 tablet (50 mg total) by mouth daily.   metFORMIN (GLUCOPHAGE) 1000 MG tablet TAKE 1 TABLET BY MOUTH TWICE  DAILY WITH MEALS   oxyCODONE-acetaminophen (PERCOCET) 5-325 MG tablet Take 1 tablet by mouth every 4 (four) hours as needed. (Patient not taking: Reported on 05/21/2023)   oxyCODONE-acetaminophen (PERCOCET) 5-325 MG tablet Take 1 tablet by mouth every 4 (four) hours as needed. (Patient not taking: Reported on 05/21/2023)   No facility-administered encounter medications on file as of 07/02/2023.    Allergies (verified) Patient has no known allergies.   History: Past Medical History:  Diagnosis Date   Ascending aortic aneurysm (HCC) 01/17/2022   Carotid artery occlusion    Diabetes mellitus without complication (HCC)    type II   History of kidney stones    Hyperlipidemia    Hypertension    Kidney stones  Stomach ulcer    Stroke [facility])    Dec. 14 2018   Past Surgical History:  Procedure Laterality Date   CYSTOSCOPY Left 12/10/2016   Procedure: cystoscopy with left ureteral stone extraction ;  Surgeon: Ihor Gully, MD;  Location: [facility];  Service: Urology;  Laterality: Left;   HERNIA REPAIR     Stomach ulcer     Family History  Problem Relation Age of Onset   Diabetes  Father    Stroke Father    Hypertension Father    Hypertension Mother    Diabetes Sister    Diabetes Son    Diabetes Daughter    Social History   Socioeconomic History   Marital status: Divorced    Spouse name: Not on file   Number of children: 4   Years of education: 16   Highest education level: Not on file  Occupational History   Occupation: [employer] driver  Tobacco Use   Smoking status: Never   Smokeless tobacco: Never  Vaping Use   Vaping status: Never Used  Substance and Sexual Activity   Alcohol use: Yes    Alcohol/week: 1.0 - 2.0 standard drink of alcohol    Types: 1 - 2 Glasses of wine per week    Comment: socially   Drug use: No   Sexual activity: Not Currently  Other Topics Concern   Not on file  Social History Narrative   Fun: Politics   Social Drivers of Health   Financial Resource Strain: Low Risk  (07/02/2023)   Overall Financial Resource Strain (CARDIA)    Difficulty of Paying Living Expenses: Not hard at all  Food Insecurity: No Food Insecurity (07/02/2023)   Hunger Vital Sign    Worried About Running Out of Food in the Last Year: Never true    Ran Out of Food in the Last Year: Never true  Transportation Needs: No Transportation Needs (07/02/2023)   PRAPARE - Administrator, Civil Service (Medical): No    Lack of Transportation (Non-Medical): No  Physical Activity: Sufficiently Active (07/02/2023)   Exercise Vital Sign    Days of Exercise per Week: 5 days    Minutes of Exercise per Session: 30 min  Stress: No Stress Concern Present (07/02/2023)   [employer] of Occupational Health - Occupational Stress Questionnaire    Feeling of Stress : Not at all  Social Connections: Moderately Isolated (07/02/2023)   Social Connection and Isolation Panel [NHANES]    Frequency of Communication with Friends and Family: More than three times a week    Frequency of Social Gatherings with Friends and Family: Three times a week    Attends Religious  Services: 1 to 4 times per year    Active Member of Clubs or Organizations: No    Attends Banker Meetings: Never    Marital Status: Divorced    Tobacco Counseling Counseling given: Not Answered   Clinical Intake:  Pre-visit preparation completed: Yes  Pain : No/denies pain Pain Score: 0-No pain     BMI - recorded: 26.54 Nutritional Status: BMI 25 -29 Overweight Nutritional Risks: None Diabetes: Yes CBG done?: Yes (91 FASTING) CBG resulted in Enter/ Edit results?: No Did pt. bring in CBG monitor from home?: No  How often do you need to have someone help you when you read instructions, pamphlets, or other written materials from your doctor or pharmacy?: 1 - Never What is the last grade level you completed in school?: [employer]  Interpreter  Needed?: No  Information entered by :: Alishba Naples N. Deangela Randleman, LPN.   Activities of Daily Living    07/02/2023   12:09 PM 12/19/2022   11:38 AM  In your present state of health, do you have any difficulty performing the following activities:  Hearing? 0 0  Vision? 0 0  Difficulty concentrating or making decisions? 0 0  Walking or climbing stairs? 0 0  Dressing or bathing? 0 0  Doing errands, shopping? 0 0  Preparing Food and eating ? N N  Using the Toilet? N N  In the past six months, have you accidently leaked urine? N N  Do you have problems with loss of bowel control? N N  Managing your Medications? N N  Managing your Finances? N N  Housekeeping or managing your Housekeeping? N N    Patient Care Team: Claiborne Rigg, NP as PCP - General (Nurse Practitioner) Chilton Si, MD as PCP - Cardiology (Cardiology) Antony Contras, MD as Consulting Physician (Ophthalmology)  Indicate any recent Medical Services you may have received from other than [facility] providers in the past year (date may be approximate).     Assessment:   This is a routine wellness examination for Stanislav.  Hearing/Vision  screen Hearing Screening - Comments:: Denies hearing difficulties.   Vision Screening - Comments:: Wears rx glasses - up to date with routine eye exams with [facility] Antony Contras, MD.)    Goals Addressed             This Visit's Progress    My goal for 2025 is to maintain my health by keeping my diabetes in control and staying active.        Depression Screen    07/02/2023   12:08 PM 05/21/2023    9:58 AM 02/18/2023    9:59 AM 12/19/2022   11:39 AM 11/14/2022   11:06 AM 05/08/2022    9:34 AM 04/03/2022    9:02 AM  PHQ 2/9 Scores  PHQ - 2 Score 0 0 0 0 0 0 0  PHQ- 9 Score 0   0 0 0     Fall Risk    07/02/2023   12:07 PM 02/18/2023    9:59 AM 12/19/2022   11:40 AM 11/14/2022   11:02 AM 08/13/2022   10:04 AM  Fall Risk   Falls in the past year? 0 0 0 0 0  Number falls in past yr: 0 0 0 0 0  Injury with Fall? 0 0 0 0 0  Risk for fall due to : No Fall Risks No Fall Risks No Fall Risks No Fall Risks No Fall Risks  Follow up Falls prevention discussed;Falls evaluation completed  Falls prevention discussed;Education provided;Falls evaluation completed Falls evaluation completed Falls evaluation completed    MEDICARE RISK AT HOME: Medicare Risk at Home Any stairs in or around the home?: Yes If so, are there any without handrails?: No Home free of loose throw rugs in walkways, pet beds, electrical cords, etc?: Yes Adequate lighting in your home to reduce risk of falls?: Yes Life alert?: No Use of a cane, Stull or w/c?: No Grab bars in the bathroom?: No Shower chair or bench in shower?: No Elevated toilet seat or a handicapped toilet?: No  TIMED UP AND GO:  Was the test performed?  No    Cognitive Function:    07/02/2023   12:08 PM  MMSE - Mini Mental State Exam  Not completed: Unable to complete  07/02/2023   12:08 PM 12/19/2022   11:40 AM 04/03/2022    9:03 AM  6CIT Screen  What Year? 0 points 0 points 0 points  What month? 0 points 0 points 0  points  What time? 0 points 0 points 0 points  Count back from 20 0 points 0 points 0 points  Months in reverse 0 points 0 points 0 points  Repeat phrase 0 points 0 points 0 points  Total Score 0 points 0 points 0 points    Immunizations Immunization History  Administered Date(s) Administered   Fluad Quad(high Dose 65+) 03/22/2023   PFIZER(Purple Top)SARS-COV-2 Vaccination 07/26/2019, 08/15/2019, 02/22/2020   PNEUMOCOCCAL CONJUGATE-20 08/13/2022   Pneumococcal Conjugate-13 08/24/2019   Tdap 11/14/2022   Zoster Recombinant(Shingrix) 08/13/2022, 11/14/2022    TDAP status: Up to date  Flu Vaccine status: Up to date  Pneumococcal vaccine status: Up to date  Covid-19 vaccine status: Completed vaccines  Qualifies for Shingles Vaccine? Yes   Zostavax completed No   Shingrix Completed?: Yes  Screening Tests Health Maintenance  Topic Date Due   COVID-19 Vaccine (4 - 2024-25 season) 01/13/2023   FOOT EXAM  11/14/2023   HEMOGLOBIN A1C  11/18/2023   OPHTHALMOLOGY EXAM  12/31/2023   Colonoscopy  01/06/2024   Diabetic kidney evaluation - eGFR measurement  03/03/2024   Diabetic kidney evaluation - Urine ACR  05/20/2024   Medicare Annual Wellness (AWV)  07/01/2024   DTaP/Tdap/Td (2 - Td or Tdap) 11/13/2032   Pneumonia Vaccine 49+ Years old  Completed   Hepatitis C Screening  Completed   Zoster Vaccines- Shingrix  Completed   HPV VACCINES  Aged Out   INFLUENZA VACCINE  Discontinued    Health Maintenance  Health Maintenance Due  Topic Date Due   COVID-19 Vaccine (4 - 2024-25 season) 01/13/2023    Colorectal cancer screening: Type of screening: Colonoscopy. Completed 01/05/2021. Repeat every 3 years  Lung Cancer Screening: (Low Dose CT Chest recommended if Age 78-80 years, 20 pack-year currently smoking OR have quit w/in 15years.) does not qualify.   Lung Cancer Screening Referral: no  Additional Screening:  Hepatitis C Screening: does qualify; Completed  06/13/2020  Vision Screening: Recommended annual ophthalmology exams for early detection of glaucoma and other disorders of the eye. Is the patient up to date with their annual eye exam?  Yes  Who is the provider or what is the name of the office in which the patient attends annual eye exams? Antony Contras, MD. If pt is not established with a provider, would they like to be referred to a provider to establish care? No .   Dental Screening: Recommended annual dental exams for proper oral hygiene  Diabetic Foot Exam: Diabetic Foot Exam: Completed 11/14/2022  Community Resource Referral / Chronic Care Management: CRR required this visit?  No   CCM required this visit?  No     Plan:     I have personally reviewed and noted the following in the patient's chart:   Medical and social history Use of alcohol, tobacco or illicit drugs  Current medications and supplements including opioid prescriptions. Patient is currently taking opioid prescriptions. Information provided to patient regarding non-opioid alternatives. Patient advised to discuss non-opioid treatment plan with their provider. Functional ability and status Nutritional status Physical activity Advanced directives List of other physicians Hospitalizations, surgeries, and ER visits in previous 12 months Vitals Screenings to include cognitive, depression, and falls Referrals and appointments  In addition, I have reviewed and discussed with patient  certain preventive protocols, quality metrics, and best practice recommendations. A written personalized care plan for preventive services as well as general preventive health recommendations were provided to patient.     Mickeal Needy, LPN   08/04/4008   After Visit Summary: (MyChart) Due to this being a telephonic visit, the after visit summary with patients personalized plan was offered to patient via MyChart   Nurse Notes: None at this time.

## 2023-07-08 ENCOUNTER — Other Ambulatory Visit: Payer: Self-pay

## 2023-07-27 ENCOUNTER — Other Ambulatory Visit: Payer: Self-pay | Admitting: Nurse Practitioner

## 2023-07-27 DIAGNOSIS — E119 Type 2 diabetes mellitus without complications: Secondary | ICD-10-CM

## 2023-08-19 ENCOUNTER — Encounter: Payer: Self-pay | Admitting: Nurse Practitioner

## 2023-08-19 ENCOUNTER — Other Ambulatory Visit: Payer: Self-pay | Admitting: Nurse Practitioner

## 2023-08-19 ENCOUNTER — Ambulatory Visit
Admission: RE | Admit: 2023-08-19 | Discharge: 2023-08-19 | Disposition: A | Discharge status: Home / Self Care | Source: Ambulatory Visit | Attending: Nurse Practitioner

## 2023-08-19 ENCOUNTER — Ambulatory Visit: Payer: Medicare Other | Attending: Nurse Practitioner | Admitting: Nurse Practitioner

## 2023-08-19 VITALS — BP 129/76 | HR 89 | Ht 70.0 in | Wt 186.0 lb

## 2023-08-19 DIAGNOSIS — I10 Essential (primary) hypertension: Secondary | ICD-10-CM

## 2023-08-19 DIAGNOSIS — M25511 Pain in right shoulder: Secondary | ICD-10-CM | POA: Diagnosis not present

## 2023-08-19 DIAGNOSIS — Z7984 Long term (current) use of oral hypoglycemic drugs: Secondary | ICD-10-CM

## 2023-08-19 DIAGNOSIS — E786 Lipoprotein deficiency: Secondary | ICD-10-CM | POA: Diagnosis not present

## 2023-08-19 DIAGNOSIS — D508 Other iron deficiency anemias: Secondary | ICD-10-CM | POA: Diagnosis not present

## 2023-08-19 DIAGNOSIS — E1159 Type 2 diabetes mellitus with other circulatory complications: Secondary | ICD-10-CM | POA: Diagnosis not present

## 2023-08-19 LAB — POCT GLYCOSYLATED HEMOGLOBIN (HGB A1C): Hemoglobin A1C: 7 % — AB (ref 4.0–5.6)

## 2023-08-19 MED ORDER — LOSARTAN POTASSIUM 50 MG PO TABS
50.0000 mg | ORAL_TABLET | Freq: Every day | ORAL | 3 refills | Status: AC
Start: 2023-08-19 — End: ?

## 2023-08-19 MED ORDER — METFORMIN HCL 1000 MG PO TABS: 1000.0000 mg | ORAL_TABLET | Freq: Two times a day (BID) | ORAL | 0 refills | Status: DC

## 2023-08-19 NOTE — Progress Notes (Signed)
 Assessment & Plan:  Jon [name] was seen today for medical management of chronic issues and shoulder pain.  Diagnoses and all orders for this visit:  Type 2 diabetes mellitus with vascular disease (HCC) -     POCT glycosylated hemoglobin (Hb A1C) -     metFORMIN (GLUCOPHAGE) 1000 MG tablet; Take 1 tablet (1,000 mg total) by mouth 2 (two) times daily with a meal. -     CMP14+EGFR Continue blood sugar control as discussed in office today, low carbohydrate diet, and regular physical exercise as tolerated, 150 minutes per week (30 min each day, 5 days per week, or 50 min 3 days per week). Keep blood sugar logs with fasting goal of 90-130 mg/dl, post prandial (after you eat) less than 180.  For Hypoglycemia: BS <60 and Hyperglycemia BS >400; contact the clinic ASAP. Annual eye exams and foot exams are recommended.  Where she got that I will send the rest the  Primary hypertension -     losartan (COZAAR) 50 MG tablet; Take 1 tablet (50 mg total) by mouth daily.   Continue all antihypertensives as prescribed.  Reminded to bring in blood pressure log for follow  up appointment.  RECOMMENDATIONS: DASH/Mediterranean Diets are healthier choices for HTN.    Acute pain of right shoulder -     DG Shoulder Right; Future  Low HDL (under 40) -     Lipid panel INSTRUCTIONS: Work on a low fat, heart healthy diet and participate in regular aerobic exercise program by working out at least 150 minutes per week; 5 days a week-30 minutes per day. Avoid red meat/beef/steak,  fried foods. junk foods, sodas, sugary drinks, unhealthy snacking, alcohol and smoking.  Drink at least 80 oz of water per day and monitor your carbohydrate intake daily.    Other iron deficiency anemia -     CBC with Differential    Patient has been counseled on age-appropriate routine health concerns for screening and prevention. These are reviewed and up-to-date. Referrals have been placed accordingly. Immunizations are up-to-date or  declined.    Subjective:   Chief Complaint  Patient presents with   Medical Management of Chronic Issues   Shoulder Pain    Right shoulder     Jon [name] 68 y.o. male presents to office today for follow up to HTN and DM.   He has a past medical history of Carotid artery occlusion, DM 2, History of kidney stones, Hyperlipidemia, Hypertension, Kidney stones, Stomach ulcer, and Stroke (taking plavix daily).    DM 2 A1c improved and near goal of less than 6.5.  Currently prescribed Trulicity 0.75 mg weekly, glimepiride 8 mg daily and metformin 1000 mg BID. Weight is stable.  He has questions about reducing the amount of medications he is currently taking. We discussed possibly decreasing the dose of metformin once A1c <6.5 Lab Results  Component Value Date   HGBA1C 7.0 (A) 08/19/2023      HTN Blood pressure is at goal. He reports normal blood pressure readings at home. Taking amlodipine and losartan daily as prescribed. Will trial losartan 25 mg daily and amlodipine 10 mg daily at this time. He is really wanting to reduce the amount of medications he is on currently.  BP Readings from Last 3 Encounters:  08/19/23 129/76  05/21/23 (!) 153/73  03/04/23 (!) 154/71     Shoulder Pain: Patient complaints of right shoulder pain. This is unrelated to any injury or trauma.  He does not sleep on his  right side at night. The pain is described as aching, sharp, and tight band.  The onset of the pain was  several months ago .  The pain occurs  infrequently  and lasts minutes to hours.  Location is global. No history of dislocation. Symptoms are aggravated by the specific activities. Symptoms are diminished by  rest.   Limited activities include: reaching, lifting, pulling, repetitive use, when pain is present.  He currently takes Advil 400 mg for pain relief.  Advil does help and he takes this infrequently (every 2 weeks or so).   Review of Systems  Constitutional:  Negative for fever,  malaise/fatigue and weight loss.  HENT: Negative.  Negative for nosebleeds.   Eyes: Negative.  Negative for blurred vision, double vision and photophobia.  Respiratory: Negative.  Negative for cough and shortness of breath.   Cardiovascular: Negative.  Negative for chest pain, palpitations and leg swelling.  Gastrointestinal: Negative.  Negative for heartburn, nausea and vomiting.  Musculoskeletal: Negative.  Negative for myalgias.  Neurological: Negative.  Negative for dizziness, focal weakness, seizures and headaches.  Psychiatric/Behavioral: Negative.  Negative for suicidal ideas.     Past Medical History:  Diagnosis Date   Ascending aortic aneurysm (HCC) 01/17/2022   Carotid artery occlusion    Diabetes mellitus without complication (HCC)    type II   History of kidney stones    Hyperlipidemia    Hypertension    Kidney stones    Stomach ulcer    Stroke [facility])    Dec. 14 2018    Past Surgical History:  Procedure Laterality Date   CYSTOSCOPY Left 12/10/2016   Procedure: cystoscopy with left ureteral stone extraction ;  Surgeon: Ihor Gully, MD;  Location: [facility];  Service: Urology;  Laterality: Left;   HERNIA REPAIR     Stomach ulcer      Family History  Problem Relation Age of Onset   Diabetes Father    Stroke Father    Hypertension Father    Hypertension Mother    Diabetes Sister    Diabetes Son    Diabetes Daughter     Social History Reviewed with no changes to be made today.   Outpatient Medications Prior to Visit  Medication Sig Dispense Refill   Accu-Chek Softclix Lancets lancets Check blood glucose level by fingerstick three times per day. 100 each 2   amLODipine (NORVASC) 10 MG tablet Take 1 tablet (10 mg total) by mouth daily. 100 tablet 3   atorvastatin (LIPITOR) 80 MG tablet TAKE 1 TABLET BY MOUTH ONCE  DAILY 100 tablet 2   Blood Glucose Monitoring Suppl (ACCU-CHEK GUIDE) w/Device KIT Check blood glucose level by fingerstick three times per day. 1 kit 0    clopidogrel (PLAVIX) 75 MG tablet TAKE 1 TABLET BY MOUTH DAILY 100 tablet 2   Dulaglutide (TRULICITY) 0.75 MG/0.5ML SOPN INJECT 0.75MG  (0.5ML) UNDER THE SKIN ONCE A WEEK. 6 mL 1   glimepiride (AMARYL) 4 MG tablet TAKE 2 TABLETS BY MOUTH DAILY  WITH BREAKFAST 200 tablet 2   Glucose Blood (BLOOD GLUCOSE TEST STRIPS) STRP Check blood glucose level by fingerstick three times per day. 200 each 6   losartan (COZAAR) 50 MG tablet Take 1 tablet (50 mg total) by mouth daily. (Patient taking differently: Take 25 mg by mouth daily.) 100 tablet 3   metFORMIN (GLUCOPHAGE) 1000 MG tablet TAKE 1 TABLET BY MOUTH TWICE  DAILY WITH MEALS 200 tablet 0   oxyCODONE-acetaminophen (PERCOCET) 5-325 MG tablet Take 1  tablet by mouth every 4 (four) hours as needed. (Patient not taking: Reported on 08/19/2023) 12 tablet 0   oxyCODONE-acetaminophen (PERCOCET) 5-325 MG tablet Take 1 tablet by mouth every 4 (four) hours as needed. (Patient not taking: Reported on 08/19/2023) 12 tablet 0   No facility-administered medications prior to visit.    No Known Allergies     Objective:    BP 129/76 (BP Location: Left Arm, Patient Position: Sitting, Cuff Size: Normal)   Pulse 89   Ht 5\' 10"  (1.778 m)   Wt 186 lb (84.4 kg)   BMI 26.69 kg/m  Wt Readings from Last 3 Encounters:  08/19/23 186 lb (84.4 kg)  07/02/23 185 lb (83.9 kg)  05/21/23 192 lb 9.6 oz (87.4 kg)    Physical Exam Vitals and nursing note reviewed.  Constitutional:      Appearance: He is well-developed.  HENT:     Head: Normocephalic and atraumatic.  Cardiovascular:     Rate and Rhythm: Normal rate and regular rhythm.     Heart sounds: Normal heart sounds. No murmur heard.    No friction rub. No gallop.  Pulmonary:     Effort: Pulmonary effort is normal. No tachypnea or respiratory distress.     Breath sounds: Normal breath sounds. No decreased breath sounds, wheezing, rhonchi or rales.  Chest:     Chest wall: No tenderness.  Abdominal:     General:  Bowel sounds are normal.     Palpations: Abdomen is soft.  Musculoskeletal:        General: Normal range of motion.     Cervical back: Normal range of motion.  Skin:    General: Skin is warm and dry.  Neurological:     Mental Status: He is alert and oriented to person, place, and time.     Coordination: Coordination normal.  Psychiatric:        Behavior: Behavior normal. Behavior is cooperative.        Thought Content: Thought content normal.        Judgment: Judgment normal.          Patient has been counseled extensively about nutrition and exercise as well as the importance of adherence with medications and regular follow-up. The patient was given clear instructions to go to ER or return to medical center if symptoms don't improve, worsen or new problems develop. The patient verbalized understanding.   Follow-up: Return in about 3 months (around 11/18/2023).   Claiborne Rigg, FNP-BC [facility] [place], [place] [phone]   08/19/2023, 12:39 PM

## 2023-08-20 ENCOUNTER — Encounter: Payer: Self-pay | Admitting: Nurse Practitioner

## 2023-08-20 LAB — CBC WITH DIFFERENTIAL/PLATELET
Basophils Absolute: 0 10*3/uL (ref 0.0–0.2)
Basos: 1 %
EOS (ABSOLUTE): 0.3 10*3/uL (ref 0.0–0.4)
Eos: 6 %
Hematocrit: 39.8 % (ref 37.5–51.0)
Hemoglobin: 12.2 g/dL — ABNORMAL LOW (ref 13.0–17.7)
Immature Grans (Abs): 0 10*3/uL (ref 0.0–0.1)
Immature Granulocytes: 0 %
Lymphocytes Absolute: 1.1 10*3/uL (ref 0.7–3.1)
Lymphs: 22 %
MCH: 24.5 pg — ABNORMAL LOW (ref 26.6–33.0)
MCHC: 30.7 g/dL — ABNORMAL LOW (ref 31.5–35.7)
MCV: 80 fL (ref 79–97)
Monocytes Absolute: 0.4 10*3/uL (ref 0.1–0.9)
Monocytes: 7 %
Neutrophils Absolute: 3.3 10*3/uL (ref 1.4–7.0)
Neutrophils: 64 %
Platelets: 268 10*3/uL (ref 150–450)
RBC: 4.97 x10E6/uL (ref 4.14–5.80)
RDW: 17.2 % — ABNORMAL HIGH (ref 11.6–15.4)
WBC: 5.2 10*3/uL (ref 3.4–10.8)

## 2023-08-20 LAB — CMP14+EGFR
ALT: 16 IU/L (ref 0–44)
AST: 18 IU/L (ref 0–40)
Albumin: 4.6 g/dL (ref 3.9–4.9)
Alkaline Phosphatase: 90 IU/L (ref 44–121)
BUN/Creatinine Ratio: 13 (ref 10–24)
BUN: 12 mg/dL (ref 8–27)
Bilirubin Total: 0.6 mg/dL (ref 0.0–1.2)
CO2: 21 mmol/L (ref 20–29)
Calcium: 9.7 mg/dL (ref 8.6–10.2)
Chloride: 105 mmol/L (ref 96–106)
Creatinine, Ser: 0.95 mg/dL (ref 0.76–1.27)
Globulin, Total: 3 g/dL (ref 1.5–4.5)
Glucose: 84 mg/dL (ref 70–99)
Potassium: 4.7 mmol/L (ref 3.5–5.2)
Sodium: 144 mmol/L (ref 134–144)
Total Protein: 7.6 g/dL (ref 6.0–8.5)
eGFR: 87 mL/min/{1.73_m2} (ref >59)

## 2023-08-20 LAB — LIPID PANEL
Chol/HDL Ratio: 3.5 ratio (ref 0.0–5.0)
Cholesterol, Total: 128 mg/dL (ref 100–199)
HDL: 37 mg/dL — ABNORMAL LOW (ref >39)
LDL Chol Calc (NIH): 76 mg/dL (ref 0–99)
Triglycerides: 73 mg/dL (ref 0–149)
VLDL Cholesterol Cal: 15 mg/dL (ref 5–40)

## 2023-08-20 NOTE — Telephone Encounter (Signed)
 Requested Prescriptions  Pending Prescriptions Disp Refills   amLODipine (NORVASC) 10 MG tablet [Pharmacy Med Name: amLODIPine Besylate 10 MG Oral Tablet] 100 tablet 1    Sig: TAKE 1 TABLET BY MOUTH DAILY     Cardiovascular: Calcium Channel Blockers 2 Passed - 08/20/2023  4:00 PM      Passed - Last BP in normal range    BP Readings from Last 1 Encounters:  08/19/23 129/76         Passed - Last Heart Rate in normal range    Pulse Readings from Last 1 Encounters:  08/19/23 89         Passed - Valid encounter within last 6 months    Recent Outpatient Visits           Yesterday Type 2 diabetes mellitus with vascular disease (HCC)   [facility] [place]. [facility] Claiborne Rigg, NP   3 months ago Annual physical exam   [facility] [place]. [facility] [place] W, NP   6 months ago Type 2 diabetes mellitus with vascular disease [facility])   [facility] [place]. [facility] Claiborne Rigg, NP   9 months ago Primary hypertension   [facility] [place]. [facility] [place], [place] W, NP   1 year ago Type 2 diabetes mellitus without complication, without long-term current use of insulin (HCC)   [facility] Of [place]. [facility] Claiborne Rigg, [place]

## 2023-10-23 ENCOUNTER — Other Ambulatory Visit: Payer: Self-pay

## 2023-11-18 ENCOUNTER — Encounter: Payer: Self-pay | Admitting: Gastroenterology

## 2023-11-18 ENCOUNTER — Encounter: Payer: Self-pay | Admitting: Nurse Practitioner

## 2023-11-18 ENCOUNTER — Ambulatory Visit: Attending: Nurse Practitioner | Admitting: Nurse Practitioner

## 2023-11-18 ENCOUNTER — Other Ambulatory Visit: Payer: Self-pay

## 2023-11-18 VITALS — BP 142/73 | HR 76 | Resp 19 | Ht 70.0 in | Wt 192.8 lb

## 2023-11-18 DIAGNOSIS — E1159 Type 2 diabetes mellitus with other circulatory complications: Secondary | ICD-10-CM

## 2023-11-18 DIAGNOSIS — Z7984 Long term (current) use of oral hypoglycemic drugs: Secondary | ICD-10-CM | POA: Diagnosis not present

## 2023-11-18 DIAGNOSIS — I10 Essential (primary) hypertension: Secondary | ICD-10-CM

## 2023-11-18 LAB — POCT GLYCOSYLATED HEMOGLOBIN (HGB A1C): Hemoglobin A1C: 7.2 % — AB (ref 4.0–5.6)

## 2023-11-18 MED ORDER — METFORMIN HCL 1000 MG PO TABS
1000.0000 mg | ORAL_TABLET | Freq: Two times a day (BID) | ORAL | 6 refills | Status: DC
  Filled 2023-11-18: qty 200, 100d supply, fill #0

## 2023-11-18 NOTE — Progress Notes (Signed)
 Assessment & Plan:  Jon [name] was seen today for diabetes and hypertension.  Diagnoses and all orders for this visit:  Type 2 diabetes mellitus with vascular disease  A1c slightly increased.  He declines increase in Trulicity .  We discussed increasing cardio with walking 3 to 4 days/week. -     POCT glycosylated hemoglobin (Hb A1C) -     metFORMIN  (GLUCOPHAGE ) 1000 MG tablet; Take 1 tablet (1,000 mg total) by mouth 2 (two) times daily with a meal.   Primary hypertension Blood pressure slightly elevated today. Continue amlodipine  10 mg daily and losartan  50 mg daily as prescribed. Reminded to bring in blood pressure log for follow  up appointment.  RECOMMENDATIONS: DASH/Mediterranean Diets are healthier choices for HTN.     Patient has been counseled on age-appropriate routine health concerns for screening and prevention. These are reviewed and up-to-date. Referrals have been placed accordingly. Immunizations are up-to-date or declined.    Subjective:   Chief Complaint  Patient presents with   Diabetes   Hypertension    Jon [name] 68 y.o. male presents to office today for follow-up in diabetes and hypertension.  He has a past medical history of Carotid artery occlusion, DM 2, History of kidney stones, Hyperlipidemia, Hypertension, Kidney stones, Stomach ulcer, and Stroke with no residual deficits (taking plavix  daily).    Patient has been counseled on age-appropriate routine health concerns for screening and prevention. These are reviewed and up-to-date. Referrals have been placed accordingly. Immunizations are up-to-date or declined.     Colonoscopy: Up-to-date PSA: Up-to-date.   HTN Blood pressure is elevated today here in office.  Weight is trending up.  He is walking 1-2 times per week.  Reports normal blood pressure readings at home. BP Readings from Last 3 Encounters:  11/18/23 (!) 142/73  08/19/23 129/76  05/21/23 (!) 153/73     DM 2 A1c not quite at goal of  less than 7.  Currently administered Trulicity  0.75 mg weekly and taking glimepiride  8 mg daily and metformin  1000 mg twice daily. Lab Results  Component Value Date   HGBA1C 7.2 (A) 11/18/2023    Review of Systems  Constitutional:  Negative for fever, malaise/fatigue and weight loss.  HENT: Negative.  Negative for nosebleeds.   Eyes: Negative.  Negative for blurred vision, double vision and photophobia.  Respiratory: Negative.  Negative for cough and shortness of breath.   Cardiovascular: Negative.  Negative for chest pain, palpitations and leg swelling.  Gastrointestinal: Negative.  Negative for heartburn, nausea and vomiting.  Musculoskeletal: Negative.  Negative for myalgias.  Neurological: Negative.  Negative for dizziness, focal weakness, seizures and headaches.  Psychiatric/Behavioral: Negative.  Negative for suicidal ideas.     Past Medical History:  Diagnosis Date   Ascending aortic aneurysm (HCC) 01/17/2022   Carotid artery occlusion    Diabetes mellitus without complication (HCC)    type II   History of kidney stones    Hyperlipidemia    Hypertension    Kidney stones    Stomach ulcer    Stroke [facility])    Dec. 14 2018    Past Surgical History:  Procedure Laterality Date   CYSTOSCOPY Left 12/10/2016   Procedure: cystoscopy with left ureteral stone extraction ;  Surgeon: Ceil Anes, MD;  Location: [facility];  Service: Urology;  Laterality: Left;   HERNIA REPAIR     Stomach ulcer      Family History  Problem Relation Age of Onset   Diabetes Father    Stroke  Father    Hypertension Father    Hypertension Mother    Diabetes Sister    Diabetes Son    Diabetes Daughter     Social History Reviewed with no changes to be made today.   Outpatient Medications Prior to Visit  Medication Sig Dispense Refill   Accu-Chek Softclix Lancets lancets Check blood glucose level by fingerstick three times per day. 100 each 2   amLODipine  (NORVASC ) 10 MG tablet TAKE 1 TABLET BY  MOUTH DAILY 100 tablet 1   atorvastatin  (LIPITOR ) 80 MG tablet TAKE 1 TABLET BY MOUTH ONCE  DAILY 100 tablet 2   Blood Glucose Monitoring Suppl (ACCU-CHEK GUIDE) w/Device KIT Check blood glucose level by fingerstick three times per day. 1 kit 0   clopidogrel  (PLAVIX ) 75 MG tablet TAKE 1 TABLET BY MOUTH DAILY 100 tablet 2   Dulaglutide  (TRULICITY ) 0.75 MG/0.5ML SOPN INJECT 0.75MG  (0.5ML) UNDER THE SKIN ONCE A WEEK. 6 mL 1   glimepiride  (AMARYL ) 4 MG tablet TAKE 2 TABLETS BY MOUTH DAILY  WITH BREAKFAST 200 tablet 2   Glucose Blood (BLOOD GLUCOSE TEST STRIPS) STRP Check blood glucose level by fingerstick three times per day. 200 each 6   losartan  (COZAAR ) 50 MG tablet Take 1 tablet (50 mg total) by mouth daily. 100 tablet 3   metFORMIN  (GLUCOPHAGE ) 1000 MG tablet Take 1 tablet (1,000 mg total) by mouth 2 (two) times daily with a meal. 200 tablet 0   No facility-administered medications prior to visit.    No Known Allergies     Objective:    BP (!) 142/73 (BP Location: Right Arm, Patient Position: Sitting, Cuff Size: Normal)   Pulse 76   Resp 19   Ht 5' 10 (1.778 m)   Wt 192 lb 12.8 oz (87.5 kg)   SpO2 99%   BMI 27.66 kg/m  Wt Readings from Last 3 Encounters:  11/18/23 192 lb 12.8 oz (87.5 kg)  08/19/23 186 lb (84.4 kg)  07/02/23 185 lb (83.9 kg)    Physical Exam Vitals and nursing note reviewed.  Constitutional:      Appearance: He is well-developed.  HENT:     Head: Normocephalic and atraumatic.  Cardiovascular:     Rate and Rhythm: Normal rate and regular rhythm.     Heart sounds: Normal heart sounds. No murmur heard.    No friction rub. No gallop.  Pulmonary:     Effort: Pulmonary effort is normal. No tachypnea or respiratory distress.     Breath sounds: Normal breath sounds. No decreased breath sounds, wheezing, rhonchi or rales.  Chest:     Chest wall: No tenderness.  Abdominal:     General: Bowel sounds are normal.     Palpations: Abdomen is soft.   Musculoskeletal:        General: Normal range of motion.     Cervical back: Normal range of motion.  Skin:    General: Skin is warm and dry.  Neurological:     Mental Status: He is alert and oriented to person, place, and time.     Coordination: Coordination normal.  Psychiatric:        Behavior: Behavior normal. Behavior is cooperative.        Thought Content: Thought content normal.        Judgment: Judgment normal.          Patient has been counseled extensively about nutrition and exercise as well as the importance of adherence with medications and regular follow-up. The  patient was given clear instructions to go to ER or return to medical center if symptoms don't improve, worsen or new problems develop. The patient verbalized understanding.   Follow-up: Return in about 3 months (around 02/21/2024).   Jon LELON Servant, FNP-BC [facility] [place], [place] [phone]   11/18/2023, 12:07 PM

## 2023-11-18 NOTE — Patient Instructions (Addendum)
 [facility] [address] [place] [address], [place], [place] [address] Phone: [phone]

## 2023-11-30 ENCOUNTER — Other Ambulatory Visit: Payer: Self-pay | Admitting: Nurse Practitioner

## 2023-11-30 DIAGNOSIS — E1159 Type 2 diabetes mellitus with other circulatory complications: Secondary | ICD-10-CM

## 2023-12-14 ENCOUNTER — Encounter: Payer: Self-pay | Admitting: Gastroenterology

## 2023-12-25 ENCOUNTER — Other Ambulatory Visit: Payer: Self-pay | Admitting: Nurse Practitioner

## 2023-12-25 ENCOUNTER — Other Ambulatory Visit: Payer: Self-pay | Admitting: Family Medicine

## 2023-12-25 DIAGNOSIS — I63332 Cerebral infarction due to thrombosis of left posterior cerebral artery: Secondary | ICD-10-CM

## 2023-12-25 DIAGNOSIS — I10 Essential (primary) hypertension: Secondary | ICD-10-CM

## 2023-12-25 DIAGNOSIS — E1159 Type 2 diabetes mellitus with other circulatory complications: Secondary | ICD-10-CM

## 2023-12-31 ENCOUNTER — Other Ambulatory Visit: Payer: Self-pay

## 2024-01-08 ENCOUNTER — Ambulatory Visit: Admitting: Gastroenterology

## 2024-01-08 ENCOUNTER — Other Ambulatory Visit: Payer: Self-pay

## 2024-01-08 ENCOUNTER — Encounter: Payer: Self-pay | Admitting: Gastroenterology

## 2024-01-08 VITALS — BP 116/68 | HR 90 | Ht 70.0 in | Wt 193.1 lb

## 2024-01-08 DIAGNOSIS — Z860101 Personal history of adenomatous and serrated colon polyps: Secondary | ICD-10-CM

## 2024-01-08 DIAGNOSIS — Z01818 Encounter for other preprocedural examination: Secondary | ICD-10-CM

## 2024-01-08 DIAGNOSIS — Z7902 Long term (current) use of antithrombotics/antiplatelets: Secondary | ICD-10-CM | POA: Diagnosis not present

## 2024-01-08 DIAGNOSIS — Z860100 Personal history of colon polyps, unspecified: Secondary | ICD-10-CM | POA: Insufficient documentation

## 2024-01-08 MED ORDER — NA SULFATE-K SULFATE-MG SULF 17.5-3.13-1.6 GM/177ML PO SOLN
1.0000 | Freq: Once | ORAL | 0 refills | Status: AC
Start: 2024-01-08 — End: 2024-01-09
  Filled 2024-01-08: qty 354, 2d supply, fill #0
  Filled 2024-01-09: qty 354, 1d supply, fill #0

## 2024-01-08 NOTE — Patient Instructions (Signed)
 You will be contacted by our office prior to your procedure for directions on holding your Plavix .  If you do not hear from our office 1 week prior to your scheduled procedure, please call [phone] to discuss.   You have been scheduled for a colonoscopy. Please follow written instructions given to you at your visit today.   If you use inhalers (even only as needed), please bring them with you on the day of your procedure.  DO NOT TAKE 7 DAYS PRIOR TO TEST- Trulicity  (dulaglutide ) Ozempic, Wegovy (semaglutide) Mounjaro (tirzepatide) Bydureon Bcise (exanatide extended release)  DO NOT TAKE 1 DAY PRIOR TO YOUR TEST Rybelsus (semaglutide) Adlyxin (lixisenatide) Victoza (liraglutide) Byetta (exanatide) _______________________________________________________________________  _______________________________________________________  If your blood pressure at your visit was 140/90 or greater, please contact your primary care physician to follow up on this.  _______________________________________________________  If you are age 65 or older, your body mass index should be between 23-30. Your Body mass index is 27.71 kg/m. If this is out of the aforementioned range listed, please consider follow up with your Primary Care Provider.  If you are age 62 or younger, your body mass index should be between 19-25. Your Body mass index is 27.71 kg/m. If this is out of the aformentioned range listed, please consider follow up with your Primary Care Provider.   ________________________________________________________  The [facility] providers would like to encourage you to use MYCHART to communicate with providers for non-urgent requests or questions.  Due to long hold times on the telephone, sending your provider a message by [facility] may be a faster and more efficient way to get a response.  Please allow 48 business hours for a response.  Please remember that this is for non-urgent requests.   _______________________________________________________  [facility] is using a team-based approach to care.  Your team is made up of your doctor and two to three APPS. Our APPS (Nurse Practitioners and Physician Assistants) work with your physician to ensure care continuity for you. They are fully qualified to address your health concerns and develop a treatment plan. They communicate directly with your gastroenterologist to care for you. Seeing the Advanced Practice Practitioners on your physician's team can help you by facilitating care more promptly, often allowing for earlier appointments, access to diagnostic testing, procedures, and other specialty referrals.

## 2024-01-08 NOTE — Progress Notes (Signed)
 01/08/2024 Jon [name] [id number] May 05, 1956   HISTORY OF PRESENT ILLNESS: This is a 68 year old male who is a patient Dr. Clayburn.  He has past medical history of hypertension, hyperlipidemia, history of stroke in 2018 on Plavix  that is prescribed by his PCP, Dr. Newlin.  He is here today to discuss and schedule colonoscopy.  His last one was in August 2022 as below.  Had diminutive polyp, but since prep was just fair repeat was recommended in 3 years.  He has had no new issues in regards to neurostatus, no strokelike symptoms.  No issues with holding Plavix  previously.  He moves his bowels regularly.  No rectal bleeding.  Colonoscopy 12/2020:  - Preparation of the colon was fair. - One diminutive polyp in the transverse colon, removed with a cold snare. Resected and retrieved. - The examination was otherwise normal on direct and retroflexion views.  Tubular adenoma.   Past Medical History:  Diagnosis Date   Ascending aortic aneurysm (HCC) 01/17/2022   Carotid artery occlusion    Diabetes mellitus without complication (HCC)    type II   History of kidney stones    Hyperlipidemia    Hypertension    Kidney stones    Stomach ulcer    Stroke [facility])    Dec. 14 2018   Past Surgical History:  Procedure Laterality Date   CYSTOSCOPY Left 12/10/2016   Procedure: cystoscopy with left ureteral stone extraction ;  Surgeon: Jon Anes, MD;  Location: [facility];  Service: Urology;  Laterality: Left;   HERNIA REPAIR     Stomach ulcer      reports that he has never smoked. He has never used smokeless tobacco. He reports current alcohol use of about 1.0 - 2.0 standard drink of alcohol per week. He reports that he does not use drugs. family history includes Diabetes in his daughter, father, sister, and son; Hypertension in his father and mother; Stroke in his father. No Known Allergies    Outpatient Encounter Medications as of 01/08/2024  Medication Sig   Accu-Chek Softclix Lancets lancets  Check blood glucose level by fingerstick three times per day.   amLODipine  (NORVASC ) 10 MG tablet TAKE 1 TABLET BY MOUTH DAILY   atorvastatin  (LIPITOR ) 80 MG tablet TAKE 1 TABLET BY MOUTH ONCE  DAILY   Blood Glucose Monitoring Suppl (ACCU-CHEK GUIDE) w/Device KIT Check blood glucose level by fingerstick three times per day.   clopidogrel  (PLAVIX ) 75 MG tablet TAKE 1 TABLET BY MOUTH DAILY   Dulaglutide  (TRULICITY ) 0.75 MG/0.5ML SOPN INJECT 0.75MG  (0.5ML) UNDER THE SKIN ONCE A WEEK.   glimepiride  (AMARYL ) 4 MG tablet TAKE 2 TABLETS BY MOUTH DAILY  WITH BREAKFAST   Glucose Blood (BLOOD GLUCOSE TEST STRIPS) STRP Check blood glucose level by fingerstick three times per day.   losartan  (COZAAR ) 50 MG tablet Take 1 tablet (50 mg total) by mouth daily.   metFORMIN  (GLUCOPHAGE ) 1000 MG tablet TAKE 1 TABLET BY MOUTH TWICE  DAILY WITH MEALS   Na Sulfate-K Sulfate-Mg Sulfate concentrate (SUPREP) 17.5-3.13-1.6 GM/177ML SOLN Take 1 kit (354 mLs total) by mouth once for 1 dose.   No facility-administered encounter medications on file as of 01/08/2024.    REVIEW OF SYSTEMS  : All other systems reviewed and negative except where noted in the History of Present Illness.   PHYSICAL EXAM: BP 116/68   Pulse 90   Ht 5' 10 (1.778 m)   Wt 193 lb 2 oz (87.6 kg)   BMI 27.71  kg/m  General: Well developed AA male in no acute distress Head: Normocephalic and atraumatic Eyes:  Sclerae anicteric, conjunctiva pink. Ears: Normal auditory acuity Lungs: Clear throughout to auscultation; no W/R/R. Heart: Regular rate and rhythm; no M/R/G. Rectal:  Will be done at the time of colonoscopy. Musculoskeletal: Symmetrical with no gross deformities  Skin: No lesions on visible extremities Neurological: Alert oriented x 4, grossly non-focal Psychological:  Alert and cooperative. Normal mood and affect  ASSESSMENT AND PLAN: *Personal history of colon polyps: Had a diminutive tubular adenoma in August 2022, but prep was  just fair so repeat recommended in 3 years.  Will schedule with Dr. Legrand with a 2-day bowel prep. *Chronic antiplatelet use with Plavix  due to history of coronary artery disease/stroke:  Hold Plavix  for 5 days before procedure - will instruct when and how to resume after procedure. Risks and benefits of procedure including bleeding, perforation, infection, missed lesions, medication reactions and possible hospitalization or surgery if complications occur explained. Additional rare but real risk of cardiovascular event such as heart attack or ischemia/infarct of other organs off of Plavix  explained and need to seek urgent help if this occurs. Will communicate by phone or EMR with patient's prescribing provider, Dr. Newlin, to confirm that holding Plavix  is reasonable in this case.     CC:  Jon [name] ORN, NP

## 2024-01-09 ENCOUNTER — Other Ambulatory Visit: Payer: Self-pay

## 2024-01-09 NOTE — Progress Notes (Signed)
 ____________________________________________________________  Attending physician addendum:  Thank you for sending this case to me. I have reviewed the entire note and agree with the plan.   Victory Brand, MD  ____________________________________________________________

## 2024-01-17 ENCOUNTER — Other Ambulatory Visit: Payer: Self-pay

## 2024-01-22 ENCOUNTER — Telehealth: Payer: Self-pay | Admitting: *Deleted

## 2024-01-22 NOTE — Telephone Encounter (Signed)
  Crockett Rallo [birth year] [id number]  @DATE @   Dear Dr. Newlin:  We have scheduled the above named patient for a(n) colonoscopy procedure. Our records show that (s)he is on anticoagulation therapy.  Please advise as to whether the patient may come off their therapy of Plavix  5 days prior to their procedure which is scheduled for 02/17/24.  Please route your response to Powell Misty, CMA or fax response to [phone].  Sincerely,   Powell Misty, [facility]

## 2024-01-22 NOTE — Telephone Encounter (Signed)
 Can you see message below for this patient of yours to determine if he can hold Plavix ?  I am not sure of when his stroke was.  Thanks.

## 2024-01-23 NOTE — Telephone Encounter (Signed)
 Jon [name] may stop plavix  5 days prior to COLONOSCOPY and resume post procedure day 1 if no bleeding complications.

## 2024-01-23 NOTE — Telephone Encounter (Signed)
 Left message for patient to call office.

## 2024-01-24 NOTE — Telephone Encounter (Signed)
 Patient informed he may hold Plavix. Patient voiced understanding.

## 2024-02-11 ENCOUNTER — Telehealth: Payer: Self-pay | Admitting: Nurse Practitioner

## 2024-02-11 NOTE — Telephone Encounter (Signed)
 Copied from CRM #[id number]. Topic: Clinical - Medical Advice >> Feb 11, 2024 12:08 PM [name] wrote: Reason for CRM: Patient called in to confirm if he recvd his flu shot on any of his past 2 previous visits, adv that I do not show he recvd the flu shot on any of the 2 previous visits. Patient req to speak with nurse because he is sure he recvd it.

## 2024-02-12 NOTE — Telephone Encounter (Signed)
 Flu vaccines scheduled

## 2024-02-13 ENCOUNTER — Ambulatory Visit: Attending: Nurse Practitioner

## 2024-02-13 DIAGNOSIS — Z23 Encounter for immunization: Secondary | ICD-10-CM

## 2024-02-13 NOTE — Progress Notes (Signed)
Flu vaccine administered in LEFT deltoid per protocols.  Information sheet given. Patient denies and pain or discomfort at injection site. Tolerated injection well no reaction.

## 2024-02-13 NOTE — Progress Notes (Signed)
 Jon [name]                                          MRN: [id number]   02/13/2024   The [facility] Quality Team Specialist reviewed this patient medical record for the purposes of chart review for care gap closure. The following were reviewed: chart review for care gap closure-diabetic eye exam.    [facility] Quality Team

## 2024-02-14 DIAGNOSIS — H524 Presbyopia: Secondary | ICD-10-CM | POA: Diagnosis not present

## 2024-02-14 DIAGNOSIS — H52203 Unspecified astigmatism, bilateral: Secondary | ICD-10-CM | POA: Diagnosis not present

## 2024-02-14 DIAGNOSIS — E119 Type 2 diabetes mellitus without complications: Secondary | ICD-10-CM | POA: Diagnosis not present

## 2024-02-14 DIAGNOSIS — Z961 Presence of intraocular lens: Secondary | ICD-10-CM | POA: Diagnosis not present

## 2024-02-17 ENCOUNTER — Encounter: Payer: Self-pay | Admitting: Gastroenterology

## 2024-02-17 ENCOUNTER — Ambulatory Visit: Admitting: Gastroenterology

## 2024-02-17 VITALS — BP 120/75 | HR 67 | Temp 97.3°F | Resp 14 | Ht 70.0 in | Wt 193.0 lb

## 2024-02-17 DIAGNOSIS — K648 Other hemorrhoids: Secondary | ICD-10-CM | POA: Diagnosis not present

## 2024-02-17 DIAGNOSIS — Z1211 Encounter for screening for malignant neoplasm of colon: Secondary | ICD-10-CM

## 2024-02-17 DIAGNOSIS — E785 Hyperlipidemia, unspecified: Secondary | ICD-10-CM | POA: Diagnosis not present

## 2024-02-17 DIAGNOSIS — Z860101 Personal history of adenomatous and serrated colon polyps: Secondary | ICD-10-CM | POA: Diagnosis not present

## 2024-02-17 DIAGNOSIS — Z860100 Personal history of colon polyps, unspecified: Secondary | ICD-10-CM

## 2024-02-17 MED ORDER — SODIUM CHLORIDE 0.9 % IV SOLN: 500.0000 mL | Freq: Once | INTRAVENOUS | Status: AC

## 2024-02-17 NOTE — Patient Instructions (Signed)
 YOU HAD AN ENDOSCOPIC PROCEDURE TODAY AT THE [facility]:   Refer to the procedure report that was given to you for any specific questions about what was found during the examination.  If the procedure report does not answer your questions, please call your gastroenterologist to clarify.  If you requested that your care partner not be given the details of your procedure findings, then the procedure report has been included in a sealed envelope for you to review at your convenience later.  YOU SHOULD EXPECT: Some feelings of bloating in the abdomen. Passage of more gas than usual.  Walking can help get rid of the air that was put into your GI tract during the procedure and reduce the bloating. If you had a lower endoscopy (such as a colonoscopy or flexible sigmoidoscopy) you may notice spotting of blood in your stool or on the toilet paper. If you underwent a bowel prep for your procedure, you may not have a normal bowel movement for a few days.  Please Note:  You might notice some irritation and congestion in your nose or some drainage.  This is from the oxygen  used during your procedure.  There is no need for concern and it should clear up in a day or so.  SYMPTOMS TO REPORT IMMEDIATELY:  Following lower endoscopy (colonoscopy or flexible sigmoidoscopy):  Excessive amounts of blood in the stool  Significant tenderness or worsening of abdominal pains  Swelling of the abdomen that is new, acute  Fever of 100F or higher   For urgent or emergent issues, a gastroenterologist can be reached at any hour by calling [phone]. Do not use MyChart messaging for urgent concerns.    DIET:  We do recommend a small meal at first, but then you may proceed to your regular diet.  Drink plenty of fluids but you should avoid alcoholic beverages for 24 hours.  MEDICATIONS: Continue present medications. Resume Plavix  (clopidogrel ) at prior dose TODAY.  FOLLOW UP: Repeat colonoscopy in 5 years  for surveillance.  Thank you for allowing us  to provide for your healthcare needs today.  ACTIVITY:  You should plan to take it easy for the rest of today and you should NOT DRIVE or use heavy machinery until tomorrow (because of the sedation medicines used during the test).    FOLLOW UP: Our staff will call the number listed on your records the next business day following your procedure.  We will call around 7:15- 8:00 am to check on you and address any questions or concerns that you may have regarding the information given to you following your procedure. If we do not reach you, we will leave a message.     If any biopsies were taken you will be contacted by phone or by letter within the next 1-3 weeks.  Please call us  at [phone] if you have not heard about the biopsies in 3 weeks.    SIGNATURES/CONFIDENTIALITY: You and/or your care partner have signed paperwork which will be entered into your electronic medical record.  These signatures attest to the fact that that the information above on your After Visit Summary has been reviewed and is understood.  Full responsibility of the confidentiality of this discharge information lies with you and/or your care-partner.

## 2024-02-17 NOTE — Progress Notes (Signed)
 Pt's states no medical or surgical changes since previsit or office visit.

## 2024-02-17 NOTE — Op Note (Signed)
 [facility] Patient Name: Story Vanvranken Procedure Date: 02/17/2024 11:08 AM MRN: [id number] Endoscopist: Victory L. Legrand , MD, [id number] Age: 68 Referring MD:  Date of Birth: [birth year] Gender: Male Account #: [id number] Procedure:                Colonoscopy Indications:              Surveillance: Personal history of adenomatous                            polyps on last colonoscopy 3 years ago                           Diminutive tubular adenoma August 2022 (fair prep                            on that exam) Medicines:                Monitored Anesthesia Care Procedure:                Pre-Anesthesia Assessment:                           - Prior to the procedure, a History and Physical                            was performed, and patient medications and                            allergies were reviewed. The patient's tolerance of                            previous anesthesia was also reviewed. The risks                            and benefits of the procedure and the sedation                            options and risks were discussed with the patient.                            All questions were answered, and informed consent                            was obtained. Prior Anticoagulants: The patient has                            taken Plavix  (clopidogrel ), last dose was 5 days                            prior to procedure. ASA Grade Assessment: III - A                            patient with severe systemic disease. After  reviewing the risks and benefits, the patient was                            deemed in satisfactory condition to undergo the                            procedure.                           After obtaining informed consent, the colonoscope                            was passed under direct vision. Throughout the                            procedure, the patient's blood pressure, pulse, and                            oxygen   saturations were monitored continuously. The                            Olympus Scope SN [id number] was introduced through the                            anus and advanced to the the cecum, identified by                            the cecal cap and ileocecal valve. The colonoscopy                            was performed without difficulty. The patient                            tolerated the procedure well. The quality of the                            bowel preparation was good. The ileocecal valve,                            cecal cap, and rectum were photographed. (They are                            partially obscured by small amount of fibrous                            debris-seeds) Patient underwent 2-day prep with                            Miralax/Suprep. Scope In: 11:23:18 AM Scope Out: 11:36:33 AM Scope Withdrawal Time: 0 hours 9 minutes 21 seconds  Total Procedure Duration: 0 hours 13 minutes 15 seconds  Findings:                 The perianal and digital rectal examinations were  normal.                           Repeat examination of right colon under NBI                            performed.                           Internal hemorrhoids were found. The hemorrhoids                            were medium-sized.                           The exam was otherwise without abnormality on                            direct and retroflexion views. Complications:            No immediate complications. Estimated Blood Loss:     Estimated blood loss: none. Impression:               - Internal hemorrhoids.                           - The examination was otherwise normal on direct                            and retroflexion views.                           - No specimens collected. Recommendation:           - Patient has a contact number available for                            emergencies. The signs and symptoms of potential                            delayed  complications were discussed with the                            patient. Return to normal activities tomorrow.                            Written discharge instructions were provided to the                            patient.                           - Resume previous diet.                           - Resume Plavix  (clopidogrel ) at prior dose today.                           - Repeat colonoscopy  in 5 years for surveillance. Isaiha Asare L. Legrand, MD 02/17/2024 11:41:43 AM This report has been signed electronically.

## 2024-02-17 NOTE — Progress Notes (Signed)
 Sedate, gd SR, tolerated procedure well, VSS, report to RN

## 2024-02-17 NOTE — Progress Notes (Signed)
 History and Physical:  This patient presents for endoscopic testing for: Encounter Diagnosis  Name Primary?   History of colonic polyps Yes    68 year old man here today for a surveillance colonoscopy with history of colon polyps. Diminutive tubular adenoma removed August 2022 with fair prep on that exam  For this procedure, he has had a 2-day bowel preparation and has held Plavix  5 days.  Patient is otherwise without complaints or active issues today.   Past Medical History: Past Medical History:  Diagnosis Date   Ascending aortic aneurysm 01/17/2022   Carotid artery occlusion    Diabetes mellitus without complication (HCC)    type II   History of kidney stones    Hyperlipidemia    Hypertension    Kidney stones    Stomach ulcer    Stroke [facility])    Dec. 14 2018     Past Surgical History: Past Surgical History:  Procedure Laterality Date   CYSTOSCOPY Left 12/10/2016   Procedure: cystoscopy with left ureteral stone extraction ;  Surgeon: Ceil Anes, MD;  Location: [facility];  Service: Urology;  Laterality: Left;   HERNIA REPAIR     Stomach ulcer      Allergies: No Known Allergies  Outpatient Meds: Current Outpatient Medications  Medication Sig Dispense Refill   Accu-Chek Softclix Lancets lancets Check blood glucose level by fingerstick three times per day. 100 each 2   amLODipine  (NORVASC ) 10 MG tablet TAKE 1 TABLET BY MOUTH DAILY 100 tablet 2   atorvastatin  (LIPITOR ) 80 MG tablet TAKE 1 TABLET BY MOUTH ONCE  DAILY 100 tablet 2   Blood Glucose Monitoring Suppl (ACCU-CHEK GUIDE) w/Device KIT Check blood glucose level by fingerstick three times per day. 1 kit 0   glimepiride  (AMARYL ) 4 MG tablet TAKE 2 TABLETS BY MOUTH DAILY  WITH BREAKFAST 200 tablet 2   Glucose Blood (BLOOD GLUCOSE TEST STRIPS) STRP Check blood glucose level by fingerstick three times per day. 200 each 6   losartan  (COZAAR ) 50 MG tablet Take 1 tablet (50 mg total) by mouth daily. 100 tablet 3    metFORMIN  (GLUCOPHAGE ) 1000 MG tablet TAKE 1 TABLET BY MOUTH TWICE  DAILY WITH MEALS 200 tablet 2   clopidogrel  (PLAVIX ) 75 MG tablet TAKE 1 TABLET BY MOUTH DAILY 100 tablet 2   Dulaglutide  (TRULICITY ) 0.75 MG/0.5ML SOPN INJECT 0.75MG  (0.5ML) UNDER THE SKIN ONCE A WEEK. 6 mL 1   Current Facility-Administered Medications  Medication Dose Route Frequency Provider Last Rate Last Admin   0.9 %  sodium chloride  infusion  500 mL Intravenous Once Danis, Keidy Thurgood L III, MD          ___________________________________________________________________ Objective   Exam:  BP 124/77   Pulse 86   Temp (!) 97.3 F (36.3 C)   Ht 5' 10 (1.778 m)   Wt 193 lb (87.5 kg)   SpO2 96%   BMI 27.69 kg/m   CV: regular , S1/S2 Resp: clear to auscultation bilaterally, normal RR and effort noted GI: soft, no tenderness, with active bowel sounds.   Assessment: Encounter Diagnosis  Name Primary?   History of colonic polyps Yes     Plan: Colonoscopy  The benefits and risks of the planned procedure(s) were described in detail with the patient or (when appropriate) their health care proxy.  Risks were outlined as including, but not limited to, bleeding, infection, perforation, adverse medication reaction leading to cardiac or pulmonary decompensation, pancreatitis (if ERCP).  The limitation of incomplete mucosal visualization was  also discussed.  No guarantees or warranties were given.  The patient is appropriate for an endoscopic procedure in the ambulatory setting.   - Victory Brand, MD

## 2024-02-18 ENCOUNTER — Telehealth: Payer: Self-pay | Admitting: *Deleted

## 2024-02-18 NOTE — Telephone Encounter (Signed)
  Follow up Call-     02/17/2024   10:24 AM  Call back number  Post procedure Call Back phone  # [phone]  Permission to leave phone message Yes     Patient questions:  Do you have a fever, pain , or abdominal swelling? No. Pain Score  0 *  Have you tolerated food without any problems? Yes.    Have you been able to return to your normal activities? Yes.    Do you have any questions about your discharge instructions: Diet   No. Medications  No. Follow up visit  No.  Do you have questions or concerns about your Care? No.  Actions: * If pain score is 4 or above: No action needed, pain <4.

## 2024-02-24 ENCOUNTER — Encounter: Payer: Self-pay | Admitting: Nurse Practitioner

## 2024-02-24 ENCOUNTER — Ambulatory Visit: Attending: Nurse Practitioner | Admitting: Nurse Practitioner

## 2024-02-24 VITALS — BP 147/75 | HR 70 | Resp 19 | Ht 70.0 in | Wt 192.4 lb

## 2024-02-24 DIAGNOSIS — Z7985 Long-term (current) use of injectable non-insulin antidiabetic drugs: Secondary | ICD-10-CM | POA: Diagnosis not present

## 2024-02-24 DIAGNOSIS — I10 Essential (primary) hypertension: Secondary | ICD-10-CM | POA: Diagnosis not present

## 2024-02-24 DIAGNOSIS — Z7984 Long term (current) use of oral hypoglycemic drugs: Secondary | ICD-10-CM

## 2024-02-24 DIAGNOSIS — D508 Other iron deficiency anemias: Secondary | ICD-10-CM | POA: Diagnosis not present

## 2024-02-24 DIAGNOSIS — E1159 Type 2 diabetes mellitus with other circulatory complications: Secondary | ICD-10-CM | POA: Diagnosis not present

## 2024-02-24 LAB — POCT GLYCOSYLATED HEMOGLOBIN (HGB A1C): HbA1c, POC (controlled diabetic range): 6.8 % (ref 0.0–7.0)

## 2024-02-24 NOTE — Progress Notes (Addendum)
 Assessment & Plan:  Edson was seen today for hypertension and diabetes.  Diagnoses and all orders for this visit:  Type 2 diabetes mellitus with vascular disease (HCC) -     POCT glycosylated hemoglobin (Hb A1C) -     CMP14+EGFR Continue blood sugar control as discussed in office today, low carbohydrate diet, and regular physical exercise as tolerated, 150 minutes per week (30 min each day, 5 days per week, or 50 min 3 days per week). Keep blood sugar logs with fasting goal of 90-130 mg/dl, post prandial (after you eat) less than 180.  For Hypoglycemia: BS <60 and Hyperglycemia BS >400; contact the clinic ASAP. Annual eye exams and foot exams are recommended.   Other iron  deficiency anemia -     CBC with Differential  Primary hypertension Continue all antihypertensives as prescribed.  Reminded to bring in blood pressure log for follow  up appointment.  RECOMMENDATIONS: DASH/Mediterranean Diets are healthier choices for HTN.     Patient has been counseled on age-appropriate routine health concerns for screening and prevention. These are reviewed and up-to-date. Referrals have been placed accordingly. Immunizations are up-to-date or declined.    Subjective:   Chief Complaint  Patient presents with   Hypertension   Diabetes    History of Present Illness Bracen Robb is a 68 year old male with hypertension and diabetes who presents for a follow-up visit.   HTN He often experiences elevated blood pressure readings during medical visits, which typically normalize upon rechecking. He attributes this to feeling nervous during appointments.However today his blood pressure remains elevated despite recheck. He is taking amlodipine  10 mg daily and losartan  50 mg daily as prescribed.  BP Readings from Last 3 Encounters:  02/24/24 (!) 147/75  02/17/24 120/75  01/08/24 116/68     DM 2 A1c at goal. He is prescribed Trulicity , glimepiride  and metformin .  His diabetes management is  stable, with a recent A1c of 6.8, improved from 7.2 previously.  Lab Results  Component Value Date   HGBA1C 6.8 02/24/2024     Review of Systems  Constitutional:  Negative for fever, malaise/fatigue and weight loss.  HENT: Negative.  Negative for nosebleeds.   Eyes: Negative.  Negative for blurred vision, double vision and photophobia.  Respiratory: Negative.  Negative for cough and shortness of breath.   Cardiovascular: Negative.  Negative for chest pain, palpitations and leg swelling.  Gastrointestinal: Negative.  Negative for heartburn, nausea and vomiting.  Musculoskeletal: Negative.  Negative for myalgias.  Neurological: Negative.  Negative for dizziness, focal weakness, seizures and headaches.  Psychiatric/Behavioral: Negative.  Negative for suicidal ideas.     Past Medical History:  Diagnosis Date   Ascending aortic aneurysm 01/17/2022   Carotid artery occlusion    Diabetes mellitus without complication (HCC)    type II   History of kidney stones    Hyperlipidemia    Hypertension    Kidney stones    Stomach ulcer    Stroke [facility])    Dec. 14 2018    Past Surgical History:  Procedure Laterality Date   CYSTOSCOPY Left 12/10/2016   Procedure: cystoscopy with left ureteral stone extraction ;  Surgeon: Ceil Anes, MD;  Location: [facility];  Service: Urology;  Laterality: Left;   HERNIA REPAIR     Stomach ulcer      Family History  Problem Relation Age of Onset   Diabetes Father    Stroke Father    Hypertension Father    Hypertension Mother  Diabetes Sister    Diabetes Son    Diabetes Daughter     Social History Reviewed with no changes to be made today.   Outpatient Medications Prior to Visit  Medication Sig Dispense Refill   Accu-Chek Softclix Lancets lancets Check blood glucose level by fingerstick three times per day. 100 each 2   amLODipine  (NORVASC ) 10 MG tablet TAKE 1 TABLET BY MOUTH DAILY 100 tablet 2   atorvastatin  (LIPITOR ) 80 MG tablet TAKE 1 TABLET  BY MOUTH ONCE  DAILY 100 tablet 2   Blood Glucose Monitoring Suppl (ACCU-CHEK GUIDE) w/Device KIT Check blood glucose level by fingerstick three times per day. 1 kit 0   clopidogrel  (PLAVIX ) 75 MG tablet TAKE 1 TABLET BY MOUTH DAILY 100 tablet 2   Dulaglutide  (TRULICITY ) 0.75 MG/0.5ML SOPN INJECT 0.75MG  (0.5ML) UNDER THE SKIN ONCE A WEEK. 6 mL 1   glimepiride  (AMARYL ) 4 MG tablet TAKE 2 TABLETS BY MOUTH DAILY  WITH BREAKFAST 200 tablet 2   Glucose Blood (BLOOD GLUCOSE TEST STRIPS) STRP Check blood glucose level by fingerstick three times per day. 200 each 6   losartan  (COZAAR ) 50 MG tablet Take 1 tablet (50 mg total) by mouth daily. 100 tablet 3   metFORMIN  (GLUCOPHAGE ) 1000 MG tablet TAKE 1 TABLET BY MOUTH TWICE  DAILY WITH MEALS 200 tablet 2   Facility-Administered Medications Prior to Visit  Medication Dose Route Frequency Provider Last Rate Last Admin   0.9 %  sodium chloride  infusion  500 mL Intravenous Once Danis, Victory CROME III, MD        No Known Allergies     Objective:    BP (!) 147/75 (BP Location: Left Arm, Patient Position: Sitting, Cuff Size: Normal)   Pulse 70   Resp 19   Ht 5' 10 (1.778 m)   Wt 192 lb 6.4 oz (87.3 kg)   SpO2 97%   BMI 27.61 kg/m  Wt Readings from Last 3 Encounters:  02/24/24 192 lb 6.4 oz (87.3 kg)  02/17/24 193 lb (87.5 kg)  01/08/24 193 lb 2 oz (87.6 kg)    Physical Exam Vitals and nursing note reviewed.  Constitutional:      Appearance: He is well-developed.  HENT:     Head: Normocephalic and atraumatic.  Cardiovascular:     Rate and Rhythm: Normal rate and regular rhythm.     Heart sounds: Normal heart sounds. No murmur heard.    No friction rub. No gallop.  Pulmonary:     Effort: Pulmonary effort is normal. No tachypnea or respiratory distress.     Breath sounds: Normal breath sounds. No decreased breath sounds, wheezing, rhonchi or rales.  Chest:     Chest wall: No tenderness.  Musculoskeletal:        General: Normal range of  motion.     Cervical back: Normal range of motion.  Skin:    General: Skin is warm and dry.  Neurological:     Mental Status: He is alert and oriented to person, place, and time.     Coordination: Coordination normal.  Psychiatric:        Behavior: Behavior normal. Behavior is cooperative.        Thought Content: Thought content normal.        Judgment: Judgment normal.          Patient has been counseled extensively about nutrition and exercise as well as the importance of adherence with medications and regular follow-up. The patient was given clear instructions  to go to ER or return to medical center if symptoms don't improve, worsen or new problems develop. The patient verbalized understanding.   Follow-up: Return in about 3 months (around 06/05/2024).   Haze LELON Servant, FNP-BC [facility] [place], [place] [phone]   03/06/2024, 1:09 PM

## 2024-02-25 LAB — CBC WITH DIFFERENTIAL/PLATELET
Basophils Absolute: 0.1 x10E3/uL (ref 0.0–0.2)
Basos: 1 %
EOS (ABSOLUTE): 0.3 x10E3/uL (ref 0.0–0.4)
Eos: 6 %
Hematocrit: 37.3 % — ABNORMAL LOW (ref 37.5–51.0)
Hemoglobin: 11.7 g/dL — ABNORMAL LOW (ref 13.0–17.7)
Immature Grans (Abs): 0 x10E3/uL (ref 0.0–0.1)
Immature Granulocytes: 0 %
Lymphocytes Absolute: 1.2 x10E3/uL (ref 0.7–3.1)
Lymphs: 22 %
MCH: 24.1 pg — ABNORMAL LOW (ref 26.6–33.0)
MCHC: 31.4 g/dL — ABNORMAL LOW (ref 31.5–35.7)
MCV: 77 fL — ABNORMAL LOW (ref 79–97)
Monocytes Absolute: 0.4 x10E3/uL (ref 0.1–0.9)
Monocytes: 8 %
Neutrophils Absolute: 3.5 x10E3/uL (ref 1.4–7.0)
Neutrophils: 63 %
Platelets: 277 x10E3/uL (ref 150–450)
RBC: 4.85 x10E6/uL (ref 4.14–5.80)
RDW: 16.6 % — ABNORMAL HIGH (ref 11.6–15.4)
WBC: 5.4 x10E3/uL (ref 3.4–10.8)

## 2024-02-25 LAB — CMP14+EGFR
ALT: 18 IU/L (ref 0–44)
AST: 18 IU/L (ref 0–40)
Albumin: 4.5 g/dL (ref 3.9–4.9)
Alkaline Phosphatase: 87 IU/L (ref 47–123)
BUN/Creatinine Ratio: 14 (ref 10–24)
BUN: 13 mg/dL (ref 8–27)
Bilirubin Total: 0.6 mg/dL (ref 0.0–1.2)
CO2: 23 mmol/L (ref 20–29)
Calcium: 9.5 mg/dL (ref 8.6–10.2)
Chloride: 103 mmol/L (ref 96–106)
Creatinine, Ser: 0.96 mg/dL (ref 0.76–1.27)
Globulin, Total: 3.3 g/dL (ref 1.5–4.5)
Glucose: 140 mg/dL — ABNORMAL HIGH (ref 70–99)
Potassium: 4.6 mmol/L (ref 3.5–5.2)
Sodium: 143 mmol/L (ref 134–144)
Total Protein: 7.8 g/dL (ref 6.0–8.5)
eGFR: 85 mL/min/1.73 (ref >59)

## 2024-02-27 ENCOUNTER — Other Ambulatory Visit: Payer: Self-pay

## 2024-02-27 ENCOUNTER — Ambulatory Visit: Payer: Self-pay | Admitting: Nurse Practitioner

## 2024-03-06 ENCOUNTER — Encounter: Payer: Self-pay | Admitting: Nurse Practitioner

## 2024-03-16 ENCOUNTER — Other Ambulatory Visit: Payer: Self-pay

## 2024-04-01 ENCOUNTER — Other Ambulatory Visit: Payer: Self-pay | Admitting: Nurse Practitioner

## 2024-04-01 ENCOUNTER — Other Ambulatory Visit: Payer: Self-pay

## 2024-04-01 DIAGNOSIS — E1159 Type 2 diabetes mellitus with other circulatory complications: Secondary | ICD-10-CM

## 2024-04-01 MED ORDER — BLOOD GLUCOSE TEST VI STRP
ORAL_STRIP | 6 refills | Status: AC
  Filled 2024-04-01: qty 200, 67d supply, fill #0

## 2024-04-12 ENCOUNTER — Other Ambulatory Visit: Payer: Self-pay | Admitting: Nurse Practitioner

## 2024-04-12 DIAGNOSIS — E119 Type 2 diabetes mellitus without complications: Secondary | ICD-10-CM

## 2024-04-14 ENCOUNTER — Other Ambulatory Visit: Payer: Self-pay | Admitting: Nurse Practitioner

## 2024-05-04 ENCOUNTER — Other Ambulatory Visit: Payer: Self-pay

## 2024-05-29 ENCOUNTER — Ambulatory Visit: Attending: Nurse Practitioner

## 2024-05-29 DIAGNOSIS — D649 Anemia, unspecified: Secondary | ICD-10-CM

## 2024-05-30 ENCOUNTER — Ambulatory Visit: Payer: Self-pay | Admitting: Nurse Practitioner

## 2024-05-30 LAB — CBC WITH DIFFERENTIAL/PLATELET
Basophils Absolute: 0.1 x10E3/uL (ref 0.0–0.2)
Basos: 1 %
EOS (ABSOLUTE): 0.4 x10E3/uL (ref 0.0–0.4)
Eos: 8 %
Hematocrit: 39.6 % (ref 37.5–51.0)
Hemoglobin: 11.9 g/dL — ABNORMAL LOW (ref 13.0–17.7)
Immature Grans (Abs): 0 x10E3/uL (ref 0.0–0.1)
Immature Granulocytes: 0 %
Lymphocytes Absolute: 1.3 x10E3/uL (ref 0.7–3.1)
Lymphs: 24 %
MCH: 24.4 pg — ABNORMAL LOW (ref 26.6–33.0)
MCHC: 30.1 g/dL — ABNORMAL LOW (ref 31.5–35.7)
MCV: 81 fL (ref 79–97)
Monocytes Absolute: 0.5 x10E3/uL (ref 0.1–0.9)
Monocytes: 9 %
Neutrophils Absolute: 3.1 x10E3/uL (ref 1.4–7.0)
Neutrophils: 58 %
Platelets: 279 x10E3/uL (ref 150–450)
RBC: 4.88 x10E6/uL (ref 4.14–5.80)
RDW: 18 % — ABNORMAL HIGH (ref 11.6–15.4)
WBC: 5.3 x10E3/uL (ref 3.4–10.8)

## 2024-06-03 ENCOUNTER — Telehealth: Payer: Self-pay | Admitting: Nurse Practitioner

## 2024-06-03 NOTE — Telephone Encounter (Signed)
 Pt confirmed appt

## 2024-06-05 ENCOUNTER — Encounter: Payer: Self-pay | Admitting: Nurse Practitioner

## 2024-06-05 ENCOUNTER — Ambulatory Visit: Attending: Nurse Practitioner | Admitting: Nurse Practitioner

## 2024-06-05 VITALS — BP 157/82 | HR 91 | Ht 70.0 in | Wt 195.2 lb

## 2024-06-05 DIAGNOSIS — Z7985 Long-term (current) use of injectable non-insulin antidiabetic drugs: Secondary | ICD-10-CM

## 2024-06-05 DIAGNOSIS — M25562 Pain in left knee: Secondary | ICD-10-CM

## 2024-06-05 DIAGNOSIS — E1159 Type 2 diabetes mellitus with other circulatory complications: Secondary | ICD-10-CM

## 2024-06-05 DIAGNOSIS — I999 Unspecified disorder of circulatory system: Secondary | ICD-10-CM

## 2024-06-05 DIAGNOSIS — I10 Essential (primary) hypertension: Secondary | ICD-10-CM

## 2024-06-05 MED ORDER — TRULICITY 0.75 MG/0.5ML ~~LOC~~ SOAJ: 0.7500 mg | SUBCUTANEOUS | 1 refills | Status: AC

## 2024-06-05 NOTE — Progress Notes (Signed)
 "  Assessment & Plan:  Jon [name] was seen today for medical management of chronic issues and knee problem.  Diagnoses and all orders for this visit:  Primary hypertension Continue all antihypertensives as prescribed.  Reminded to bring in blood pressure log for follow  up appointment.  RECOMMENDATIONS: DASH/Mediterranean Diets are healthier choices for HTN.    Type 2 diabetes mellitus with vascular disease  A1c at goal -     Urine Albumin/Creatinine with ratio (send out) [LAB689] -     CMP14+EGFR -     Dulaglutide  (TRULICITY ) 0.75 MG/0.5ML SOAJ; Inject 0.75 mg into the skin once a week. -     Hemoglobin A1c  Acute pain of left knee Recommend Voltaren  gel or Biofreeze Recommend copper compression sleeve Recommend quad strengthening exercises   Patient has been counseled on age-appropriate routine health concerns for screening and prevention. These are reviewed and up-to-date. Referrals have been placed accordingly. Immunizations are up-to-date or declined.    Subjective:   Chief Complaint  Patient presents with   Medical Management of Chronic Issues   Knee Problem    Patient has been experiencing left knee discomfort.     Jon [name] 69 y.o. male presents to office today for follow up to HTN  He has a past medical history of Carotid artery occlusion, DM 2, History of kidney stones, Hyperlipidemia, Hypertension, Kidney stones, Stomach ulcer, and Stroke with no residual deficits (taking plavix  daily).     HTN Blood pressure is elevated however he reports normal readings at home via his home monitoring device.  BP Readings from Last 3 Encounters:  06/05/24 (!) 157/82  02/24/24 (!) 147/75  02/17/24 120/75     He has been experiencing a sensation of tightness in his left knee for approximately a month and a half. The sensation is described as a 'tight band' around the knee, without any associated pain, swelling, or mobility issues. The knee popped twice recently, which  temporarily alleviated the sensation, but it returned. There have been no recent strenuous activities or exercises that could have contributed to this sensation.   Review of Systems  Constitutional:  Negative for fever, malaise/fatigue and weight loss.  HENT: Negative.  Negative for nosebleeds.   Eyes: Negative.  Negative for blurred vision, double vision and photophobia.  Respiratory: Negative.  Negative for cough and shortness of breath.   Cardiovascular: Negative.  Negative for chest pain, palpitations and leg swelling.  Gastrointestinal: Negative.  Negative for heartburn, nausea and vomiting.  Musculoskeletal:  Positive for joint pain. Negative for myalgias.  Neurological: Negative.  Negative for dizziness, focal weakness, seizures and headaches.  Psychiatric/Behavioral: Negative.  Negative for suicidal ideas.     Past Medical History:  Diagnosis Date   Ascending aortic aneurysm 01/17/2022   Carotid artery occlusion    Diabetes mellitus without complication (HCC)    type II   History of kidney stones    Hyperlipidemia    Hypertension    Kidney stones    Stomach ulcer    Stroke [facility])    Dec. 14 2018    Past Surgical History:  Procedure Laterality Date   CYSTOSCOPY Left 12/10/2016   Procedure: cystoscopy with left ureteral stone extraction ;  Surgeon: Ceil Anes, MD;  Location: [facility];  Service: Urology;  Laterality: Left;   HERNIA REPAIR     Stomach ulcer      Family History  Problem Relation Age of Onset   Diabetes Father    Stroke Father  Hypertension Father    Hypertension Mother    Diabetes Sister    Diabetes Son    Diabetes Daughter     Social History Reviewed with no changes to be made today.   Outpatient Medications Prior to Visit  Medication Sig Dispense Refill   Accu-Chek Softclix Lancets lancets Check blood glucose level by fingerstick three times per day. 100 each 2   amLODipine  (NORVASC ) 10 MG tablet TAKE 1 TABLET BY MOUTH DAILY 100 tablet 2    atorvastatin  (LIPITOR ) 80 MG tablet TAKE 1 TABLET BY MOUTH ONCE  DAILY 100 tablet 2   Blood Glucose Monitoring Suppl (ACCU-CHEK GUIDE) w/Device KIT Check blood glucose level by fingerstick three times per day. 1 kit 0   clopidogrel  (PLAVIX ) 75 MG tablet TAKE 1 TABLET BY MOUTH DAILY 100 tablet 2   glimepiride  (AMARYL ) 4 MG tablet TAKE 2 TABLETS BY MOUTH DAILY  WITH BREAKFAST 200 tablet 1   Glucose Blood (BLOOD GLUCOSE TEST STRIPS) STRP Check blood glucose level by fingerstick three times per day. 200 each 6   losartan  (COZAAR ) 50 MG tablet Take 1 tablet (50 mg total) by mouth daily. 100 tablet 3   metFORMIN  (GLUCOPHAGE ) 1000 MG tablet TAKE 1 TABLET BY MOUTH TWICE  DAILY WITH MEALS 200 tablet 2   TRULICITY  0.75 MG/0.5ML SOAJ INJECT 0.75MG  (0.5ML) UNDER THE SKIN ONCE A WEEK. 8 mL 0   Facility-Administered Medications Prior to Visit  Medication Dose Route Frequency Provider Last Rate Last Admin   0.9 %  sodium chloride  infusion  500 mL Intravenous Once Danis, Victory CROME III, MD        Allergies[1]     Objective:    BP (!) 157/82 (BP Location: Left Arm, Patient Position: Sitting, Cuff Size: Normal)   Pulse 91   Ht 5' 10 (1.778 m)   Wt 195 lb 3.2 oz (88.5 kg)   SpO2 91%   BMI 28.01 kg/m  Wt Readings from Last 3 Encounters:  06/05/24 195 lb 3.2 oz (88.5 kg)  02/24/24 192 lb 6.4 oz (87.3 kg)  02/17/24 193 lb (87.5 kg)    Physical Exam Vitals and nursing note reviewed.  Constitutional:      Appearance: He is well-developed.  HENT:     Head: Normocephalic and atraumatic.  Cardiovascular:     Rate and Rhythm: Normal rate and regular rhythm.     Heart sounds: Normal heart sounds. No murmur heard.    No friction rub. No gallop.  Pulmonary:     Effort: Pulmonary effort is normal. No tachypnea or respiratory distress.     Breath sounds: Normal breath sounds. No decreased breath sounds, wheezing, rhonchi or rales.  Chest:     Chest wall: No tenderness.  Musculoskeletal:        General:  Normal range of motion.     Cervical back: Normal range of motion.  Skin:    General: Skin is warm and dry.  Neurological:     Mental Status: He is alert and oriented to person, place, and time.     Coordination: Coordination normal.  Psychiatric:        Behavior: Behavior normal. Behavior is cooperative.        Thought Content: Thought content normal.        Judgment: Judgment normal.          Patient has been counseled extensively about nutrition and exercise as well as the importance of adherence with medications and regular follow-up. The patient was  given clear instructions to go to ER or return to medical center if symptoms don't improve, worsen or new problems develop. The patient verbalized understanding.   Follow-up: Return in about 3 months (around 09/03/2024).   Haze LELON Servant, FNP-BC [facility] [place], [place] [phone]   06/05/2024, 4:59 PM     [1] No Known Allergies  "

## 2024-06-06 LAB — CMP14+EGFR
ALT: 20 [IU]/L (ref 0–44)
AST: 18 [IU]/L (ref 0–40)
Albumin: 4.5 g/dL (ref 3.9–4.9)
Alkaline Phosphatase: 89 [IU]/L (ref 47–123)
BUN/Creatinine Ratio: 12 (ref 10–24)
BUN: 12 mg/dL (ref 8–27)
Bilirubin Total: 0.5 mg/dL (ref 0.0–1.2)
CO2: 21 mmol/L (ref 20–29)
Calcium: 9.3 mg/dL (ref 8.6–10.2)
Chloride: 100 mmol/L (ref 96–106)
Creatinine, Ser: 0.98 mg/dL (ref 0.76–1.27)
Globulin, Total: 3.2 g/dL (ref 1.5–4.5)
Glucose: 186 mg/dL — ABNORMAL HIGH (ref 70–99)
Potassium: 4.4 mmol/L (ref 3.5–5.2)
Sodium: 137 mmol/L (ref 134–144)
Total Protein: 7.7 g/dL (ref 6.0–8.5)
eGFR: 83 mL/min/{1.73_m2}

## 2024-06-06 LAB — MICROALBUMIN / CREATININE URINE RATIO
Creatinine, Urine: 130.9 mg/dL
Microalb/Creat Ratio: 24 mg/g{creat} (ref 0–29)
Microalbumin, Urine: 31.9 ug/mL

## 2024-06-06 LAB — HEMOGLOBIN A1C
Est. average glucose Bld gHb Est-mCnc: 160 mg/dL
Hgb A1c MFr Bld: 7.2 % — ABNORMAL HIGH (ref 4.8–5.6)

## 2024-06-07 ENCOUNTER — Ambulatory Visit: Payer: Self-pay | Admitting: Nurse Practitioner

## 2024-07-07 ENCOUNTER — Ambulatory Visit: Payer: Medicare Other

## 2024-09-07 ENCOUNTER — Ambulatory Visit: Payer: Self-pay | Admitting: Nurse Practitioner
